# Patient Record
Sex: Male | Born: 1955 | Race: White | Hispanic: No | Marital: Married | Smoking: Never smoker
Health system: Southern US, Community
[De-identification: ages and names within clinical notes are randomized; demographics above are authoritative.]

## PROBLEM LIST (undated history)

## (undated) DIAGNOSIS — C649 Malignant neoplasm of unspecified kidney, except renal pelvis: Secondary | ICD-10-CM

## (undated) DIAGNOSIS — N289 Disorder of kidney and ureter, unspecified: Secondary | ICD-10-CM

## (undated) DIAGNOSIS — F329 Major depressive disorder, single episode, unspecified: Secondary | ICD-10-CM

## (undated) DIAGNOSIS — I219 Acute myocardial infarction, unspecified: Secondary | ICD-10-CM

## (undated) DIAGNOSIS — E785 Hyperlipidemia, unspecified: Secondary | ICD-10-CM

## (undated) DIAGNOSIS — I10 Essential (primary) hypertension: Secondary | ICD-10-CM

## (undated) DIAGNOSIS — C4490 Unspecified malignant neoplasm of skin, unspecified: Secondary | ICD-10-CM

## (undated) DIAGNOSIS — I509 Heart failure, unspecified: Secondary | ICD-10-CM

## (undated) DIAGNOSIS — F32A Depression, unspecified: Secondary | ICD-10-CM

## (undated) DIAGNOSIS — M1990 Unspecified osteoarthritis, unspecified site: Secondary | ICD-10-CM

## (undated) DIAGNOSIS — F419 Anxiety disorder, unspecified: Secondary | ICD-10-CM

## (undated) DIAGNOSIS — K219 Gastro-esophageal reflux disease without esophagitis: Secondary | ICD-10-CM

## (undated) HISTORY — DX: Unspecified osteoarthritis, unspecified site: M19.90

## (undated) HISTORY — DX: Depression, unspecified: F32.A

## (undated) HISTORY — PX: CARDIAC CATHETERIZATION: SHX172

## (undated) HISTORY — DX: Malignant neoplasm of unspecified kidney, except renal pelvis: C64.9

## (undated) HISTORY — PX: BACK SURGERY: SHX140

## (undated) HISTORY — PX: TONSILLECTOMY: SUR1361

## (undated) HISTORY — DX: Anxiety disorder, unspecified: F41.9

## (undated) HISTORY — DX: Acute myocardial infarction, unspecified: I21.9

## (undated) HISTORY — DX: Major depressive disorder, single episode, unspecified: F32.9

## (undated) HISTORY — DX: Unspecified malignant neoplasm of skin, unspecified: C44.90

## (undated) HISTORY — DX: Gastro-esophageal reflux disease without esophagitis: K21.9

## (undated) HISTORY — DX: Hyperlipidemia, unspecified: E78.5

## (undated) HISTORY — PX: KNEE SURGERY: SHX244

## (undated) HISTORY — DX: Essential (primary) hypertension: I10

## (undated) HISTORY — PX: LEG SURGERY: SHX1003

---

## 2007-03-14 ENCOUNTER — Emergency Department: Payer: Self-pay | Admitting: Emergency Medicine

## 2014-01-21 DIAGNOSIS — C7951 Secondary malignant neoplasm of bone: Secondary | ICD-10-CM | POA: Insufficient documentation

## 2014-01-21 DIAGNOSIS — C649 Malignant neoplasm of unspecified kidney, except renal pelvis: Secondary | ICD-10-CM | POA: Insufficient documentation

## 2014-01-21 DIAGNOSIS — C7800 Secondary malignant neoplasm of unspecified lung: Secondary | ICD-10-CM | POA: Insufficient documentation

## 2015-06-07 DIAGNOSIS — I82432 Acute embolism and thrombosis of left popliteal vein: Secondary | ICD-10-CM | POA: Insufficient documentation

## 2015-12-09 ENCOUNTER — Inpatient Hospital Stay: Payer: 59 | Attending: Hematology and Oncology | Admitting: Hematology and Oncology

## 2015-12-09 ENCOUNTER — Encounter: Payer: Self-pay | Admitting: Hematology and Oncology

## 2015-12-09 ENCOUNTER — Inpatient Hospital Stay: Payer: 59

## 2015-12-09 ENCOUNTER — Other Ambulatory Visit: Payer: Self-pay | Admitting: Hematology and Oncology

## 2015-12-09 ENCOUNTER — Encounter (INDEPENDENT_AMBULATORY_CARE_PROVIDER_SITE_OTHER): Payer: Self-pay

## 2015-12-09 VITALS — BP 142/93 | HR 66 | Temp 98.0°F | Resp 17 | Ht 75.0 in | Wt 252.3 lb

## 2015-12-09 DIAGNOSIS — I10 Essential (primary) hypertension: Secondary | ICD-10-CM | POA: Diagnosis not present

## 2015-12-09 DIAGNOSIS — Z8701 Personal history of pneumonia (recurrent): Secondary | ICD-10-CM | POA: Insufficient documentation

## 2015-12-09 DIAGNOSIS — R339 Retention of urine, unspecified: Secondary | ICD-10-CM | POA: Diagnosis not present

## 2015-12-09 DIAGNOSIS — R918 Other nonspecific abnormal finding of lung field: Secondary | ICD-10-CM | POA: Diagnosis not present

## 2015-12-09 DIAGNOSIS — I82432 Acute embolism and thrombosis of left popliteal vein: Secondary | ICD-10-CM

## 2015-12-09 DIAGNOSIS — Z86718 Personal history of other venous thrombosis and embolism: Secondary | ICD-10-CM | POA: Diagnosis not present

## 2015-12-09 DIAGNOSIS — F329 Major depressive disorder, single episode, unspecified: Secondary | ICD-10-CM | POA: Diagnosis not present

## 2015-12-09 DIAGNOSIS — C7800 Secondary malignant neoplasm of unspecified lung: Secondary | ICD-10-CM

## 2015-12-09 DIAGNOSIS — R591 Generalized enlarged lymph nodes: Secondary | ICD-10-CM | POA: Diagnosis not present

## 2015-12-09 DIAGNOSIS — Z7901 Long term (current) use of anticoagulants: Secondary | ICD-10-CM | POA: Insufficient documentation

## 2015-12-09 DIAGNOSIS — Z923 Personal history of irradiation: Secondary | ICD-10-CM

## 2015-12-09 DIAGNOSIS — C642 Malignant neoplasm of left kidney, except renal pelvis: Secondary | ICD-10-CM

## 2015-12-09 DIAGNOSIS — Z79899 Other long term (current) drug therapy: Secondary | ICD-10-CM

## 2015-12-09 DIAGNOSIS — I252 Old myocardial infarction: Secondary | ICD-10-CM | POA: Insufficient documentation

## 2015-12-09 DIAGNOSIS — C7951 Secondary malignant neoplasm of bone: Secondary | ICD-10-CM | POA: Diagnosis not present

## 2015-12-09 LAB — COMPREHENSIVE METABOLIC PANEL
ALT: 18 U/L (ref 17–63)
AST: 18 U/L (ref 15–41)
Albumin: 3.7 g/dL (ref 3.5–5.0)
Alkaline Phosphatase: 78 U/L (ref 38–126)
Anion gap: 7 (ref 5–15)
BUN: 19 mg/dL (ref 6–20)
CO2: 29 mmol/L (ref 22–32)
Calcium: 8.8 mg/dL — ABNORMAL LOW (ref 8.9–10.3)
Chloride: 102 mmol/L (ref 101–111)
Creatinine, Ser: 1.04 mg/dL (ref 0.61–1.24)
GFR calc Af Amer: >60 mL/min (ref >60)
GFR calc non Af Amer: >60 mL/min (ref >60)
Glucose, Bld: 105 mg/dL — ABNORMAL HIGH (ref 65–99)
Potassium: 4.7 mmol/L (ref 3.5–5.1)
Sodium: 138 mmol/L (ref 135–145)
Total Bilirubin: 1 mg/dL (ref 0.3–1.2)
Total Protein: 6.9 g/dL (ref 6.5–8.1)

## 2015-12-09 LAB — CBC WITH DIFFERENTIAL/PLATELET
Basophils Absolute: 0 10*3/uL (ref 0–0.1)
Basophils Relative: 0 %
Eosinophils Absolute: 0.1 10*3/uL (ref 0–0.7)
Eosinophils Relative: 3 %
HCT: 36.2 % — ABNORMAL LOW (ref 40.0–52.0)
Hemoglobin: 12 g/dL — ABNORMAL LOW (ref 13.0–18.0)
Lymphocytes Relative: 12 %
Lymphs Abs: 0.6 10*3/uL — ABNORMAL LOW (ref 1.0–3.6)
MCH: 30 pg (ref 26.0–34.0)
MCHC: 33 g/dL (ref 32.0–36.0)
MCV: 90.9 fL (ref 80.0–100.0)
Monocytes Absolute: 0.5 10*3/uL (ref 0.2–1.0)
Monocytes Relative: 9 %
Neutro Abs: 3.9 10*3/uL (ref 1.4–6.5)
Neutrophils Relative %: 76 %
Platelets: 147 10*3/uL — ABNORMAL LOW (ref 150–440)
RBC: 3.99 MIL/uL — ABNORMAL LOW (ref 4.40–5.90)
RDW: 17.3 % — ABNORMAL HIGH (ref 11.5–14.5)
WBC: 5.1 10*3/uL (ref 3.8–10.6)

## 2015-12-09 LAB — LACTATE DEHYDROGENASE: LDH: 141 U/L (ref 98–192)

## 2015-12-09 NOTE — Progress Notes (Signed)
Pt reports pain in left leg and back.  Pt reports abdominal hernia.  Left kidney non-functioning. Uses walker for hx of broken left leg due to mets.  Pt reports he thinks he is due for CT and bone scan.  Had a urologist would like to be set up here and orthopedic Dr. for hx of fracture related to the cancer.

## 2015-12-10 ENCOUNTER — Telehealth: Payer: Self-pay | Admitting: *Deleted

## 2015-12-10 NOTE — Telephone Encounter (Signed)
I called pt and he is just moved here from [facility] and he needs PCP, orthopedic, dentist and urology appt.  I entered the urology appt through the [facility] system and they called pt back today and gave him an appt to see a physician 7/11 at [facility] urology. I got an appt with PCP at [facility] clinic with Dr. Carlean Jews on 12/13/15 9 am.  A pt had cancelled and they had that spot available otherwise it would be in august before he could see a PCP.  Pt acceptable to the appt with Dr. Carlean Jews and all notes from [facility] sent on pt.  [name] note to be done this weekend and I will fax am on Monday.  I have tried getting ortho appt. But they would allow me to send notes over but they are requesting that I get the surgery notes and pathology notes before they will make an appt.  Dr. Marry Guan on call today and I was told to send what I had to [phone].  I did. Will request the additional records from [place].  Still working on dentist appt but pt made aware of the status.

## 2015-12-12 ENCOUNTER — Encounter: Payer: Self-pay | Admitting: Hematology and Oncology

## 2015-12-12 NOTE — Progress Notes (Signed)
[facility] day:  12/09/2015  Chief Complaint: Marc Blake is a 60 y.o. male with metastatic renal cell carcinoma who is referred by Dr. Karie Soda of [facility] for new patient assessment.  HPI: The patient presented with metastatic renal cell carcinoma in 2015. He described feeling lousy and run down for a long period of time.  He noted pain in his left upper abdomen, back and around his ribs, and hematuria.  He underwent imaging studies in [place], [place].    Abdominal and pelvic CT scan on 01/12/2014 revealed an 8.5 x 10.2 x 9.2 cm irregularly enhancing mass of the left kidney with some internal calcifications. There were enlarged left sided renal hilar lymph nodes. There were multiple pulmonary nodules in both lower lobes(largest 1.1 cm). There was a destructive bone lesion with an irregular 8.6 x 5.1 cm soft tissue mass invasive of the spinal canal narrowing at least 50% and disturbing the pedicle and portions of the left transverse process rib and T8 vertebral body.   Chest CT on 01/15/2014 revealed innumerable pulmonary nodules, a large metastatic lesion at T8 and left eighth rib with invasion into the spinal canal and moderate canal stenosis. There was a left upper pole right kidney mass.  Head MRI was negative.  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  He began pazopanib (Votrient) on 02/13/2014. Treatment began at 800 mg a day.  At some point, his dose was decreased to 600 mg a day (patient unclear of reason).  Chest CT on 05/04/2014 revealed lobular mass expanding expanding and destroying the posterior left eighth rib and invading the vertebral body with significant invasion into the spinal canal with severe narrowing. Lesion measured 8.7 x 4.3 cm.  Largest pulmonary nodules in the right lower lobe and left lower lobe measured 1.2 x 1.2 cm. The upper pole of the left kidney  tumor measured 7.6 x 10 cm Compared to films from 12/19/2013, the pulmonary nodules were slightly smaller and the kidney lesion stable.  Notes indicate he received radiation therapy to T7 - T9 and associated ribs.  He received 3000 cGy in 10 fractions by Dr. Robley Fries.  Radiation started on 05/11/2014.  Notes by Dr. Rudean Curt, oncologist, on 06/15/2014 indicated progressive disease on pazopanib.  He received 1 dose of nivolumab.  He was admitted to [facility] in [place], [place], from 07/06/2014 - 07/11/2014.  He presented with progressive paralysis from the waist down.  He underwent left posterolateral thoracotomy with left chest wall resection with T8 corpectomy and interbody fusion with donor bone on 07/06/2014 by Dr. Judeen Hammans and Dr. Bedelia Person.  Pathology revealed metastatic renal cell carcinoma.     Post-operatively, he developed a right sided pneumothorax requiring chest tube placement and intubation.   Bronchoscopy revealed thick mucous secretions.  He developed sepsis secondary to a submandibular abscess dental abscess (Ludwig's angina) with gas formation. He was treated with clindamycin and Unasyn.  On 07/09/2014 he underwent tracheostomy and I&D of a right neck abscess with removal of a right tooth #32.  He underwent a PEG tube placement.  Notes indicate that he was transferred to a higher care facility [facility]) secondary to necrotizing fasciitis  Chest, abdomen, and pelvic CT scan on 09/30/2014 revealed numerous bilateral pulmonary metastasis some of which were smaller in size and others slightly larger (all around 1 cm).  The left renal mass had increased to 8.7  x 10.5 cm (previously 8.4 x 10.1 cm).  Chest evident pelvic CT scan on 12/23/2014 revealed multiple pulmonary nodules some which had decreased in size and some stable. There were no new or enlarging lesions.  The left renal mass was slightly larger (11.3 x 8.9 cm) with hydronephrosis on the left which had  worsened slightly.   The right renal mass was stable.  He developed a pathologic fracture of the left femur on 01/26/2015 after crossing his legs.  He underwent intramedullary nailing of a left femur pathologic fracture on 01/27/2015 by Dr. Otelia Sergeant.  Following surgery, he was in inpatient rehab for slightly over 1 month.  He received radiation to the left leg.  He was admitted to [facility] from 04/26/2015 - 04/30/2015 with aspiration pneumonia.  He presented with acute hypoxemic respiratory failure.  Notes indicate opioid overdose. Long-acting morphine was reduced from 60 mg twice a day to 15 mg twice a day and short acting morphine discontinued.  Percocet was switched to oxycodone.  Benzodiazepines were decreased from 5 mg every 8 hours to 2.5 mg every 12 hours.  He is currently taking MSContin 15 mg BID and oxycodone 5 mg q day.  Chest, abdomen, and pelvic CT scan on 05/31/2015 revealed slight enlargement of multiple pulmonary nodules.  The left renal mass was similar (12.2 x 9.6 cm).  The right kidney lesion was stable (lower pole lesion 2 x 1.7 cm).  Bone scan on 05/31/2015 revealed slight increased activity in the left costal vertebral junction at T7.  He developed a left lower extremity DVT (popliteal and soleal vein) and was started on Xarelto on 06/07/2015.  Notes indicate that he was out of his Votrient for about 2 weeks in 09/2015.  The patient notes that he is due for imaging studies.  He was initially on Zometa, but has been on hold secondary to dental issues.  He was followed by Lorayne Bender of urology.  He has a long standing history of incomplete bladder emptying.  He has performed in and out self catheterization in the past.  He underwent TURP in 2005 and 2010.  CT scan on 12/23/2013 revealed severe left mid and lower pole hydronephrosis.  He underwent cystoscopy and urethral dilatation on 02/05/2014.  Creatinine was 2.09 on 02/13/2014.  On 02/13/2014 he was unable to void x 3  days.  A  Foley catheter was placed.  He was noted to have persistent elevated PVR (> 900 cc).  On 06/03/2014, he underwent cystourethroscopy and urethral dilatation.  Symptomatically, he feels good.  He notes decreased sensation in his left leg.  He is ambulating better.   Past Medical History  Diagnosis Date  . Hypertension   . Cancer [facility])     Renal cell carcinoma  . Depression   . Myocardial infarction ([place])   . Anxiety     Past Surgical History  Procedure Laterality Date  . Back surgery    . Leg surgery Left   . Knee surgery Left     Family History  Problem Relation Age of Onset  . Hypertension Father   . Heart attack Father   . Stroke Sister   . Hypertension Brother   . Stroke Maternal Uncle     Social History:  reports that he has never smoked. He has never used smokeless tobacco. He reports that he does not drink alcohol or use illicit drugs.  He is originally from [place].  He then lived in [place] for 17 years.  He  moved to [place] 2 months ago with his wife.  The patient is alone today.  Allergies: No Known Allergies  Current Medications: Current Outpatient Prescriptions  Medication Sig Dispense Refill  . amLODipine (NORVASC) 2.5 MG tablet TAKE 1 TABLET ONCE A DAY FOR 30 DAYS  2  . atorvastatin (LIPITOR) 20 MG tablet TAKE 1 TABLET ONCE A DAY (AT BEDTIME) FOR 30 DAYS  2  . busPIRone (BUSPAR) 5 MG tablet TAKE 1 TABLET 3 TIMES A DAY FOR 30 DAYS  2  . carvedilol (COREG) 12.5 MG tablet TAKE 1 TABLET BY MOUTH TWICE A DAY FOR 30 DAYS  3  . gabapentin (NEURONTIN) 300 MG capsule Take 300 mg by mouth 3 (three) times daily.  2  . lisinopril (PRINIVIL,ZESTRIL) 20 MG tablet TAKE 1 TABLET ONCE A DAY FOR 30 DAYS  3  . morphine (MS CONTIN) 15 MG 12 hr tablet Take 15 mg by mouth every 12 (twelve) hours.  0  . nitrofurantoin (MACRODANTIN) 50 MG capsule TAKE 1 CAPSULE BY MOUTH AT BEDTIME FOR 30 DAYS  3  . oxyCODONE (OXY IR/ROXICODONE) 5 MG immediate release tablet Take 5  mg by mouth every 8 (eight) hours as needed. for pain  0  . pazopanib (VOTRIENT) 200 MG tablet Take 600 mg by mouth daily. Take on an empty stomach.    . sertraline (ZOLOFT) 100 MG tablet Take 100 mg by mouth at bedtime.  1  . tamsulosin (FLOMAX) 0.4 MG CAPS capsule TAKE 2 CAPSULES ONCE A DAY FOR 30 DAY(S)  3  . traZODone (DESYREL) 50 MG tablet Take 25 mg by mouth daily.  3  . XARELTO 20 MG TABS tablet Take 20 mg by mouth daily.  1   No current facility-administered medications for this visit.    Review of Systems:  GENERAL:  Feels good.  No fevers or sweats.  Weight up 2 pounds from baseline. PERFORMANCE STATUS (ECOG):  1 HEENT:  No visual changes, runny nose, sore throat, mouth sores or tenderness. Lungs: No shortness of breath or cough.  No hemoptysis. Cardiac:  No chest pain, palpitations, orthopnea, or PND. GI:  Diarrhea 1 day/week; Probiotics help.  No nausea, vomiting, constipation, melena or hematochezia. GU:  Bladder emptying issues.  No self catheterization in awhile.  No urgency, frequency, dysuria, or hematuria. Musculoskeletal:  No back pain.  No joint pain.  No muscle tenderness. Extremities:  No pain or swelling. Skin:  No rashes or skin changes. Neuro:  No headache, numbness or weakness.  Decreased feeling left leg.  Slight balance problem. Endocrine:  No diabetes, thyroid issues, hot flashes or night sweats. Psych:  No mood changes, depression or anxiety. Pain:  No focal pain. Review of systems:  All other systems reviewed and found to be negative.  Physical Exam: Blood pressure 142/93, pulse 66, temperature 98 F (36.7 C), temperature source Tympanic, resp. rate 17, height _0  (1.905 m), weight 252 lb 5.1 oz (114.45 kg). GENERAL:  Well developed, well nourished, gentleman sitting comfortably in the exam room in no acute distress.  He has a rolling walker at his side. MENTAL STATUS:  Alert and oriented to person, place and time. HEAD:  Long gray hair with goatee.   Normocephalic, atraumatic, face symmetric, no Cushingoid features. EYES:  Pupils equal round and reactive to light and accomodation.  No conjunctivitis or scleral icterus. ENT:  Oropharynx clear without lesion.  Poor dentition.  Tongue normal. Mucous membranes moist.  RESPIRATORY:  Clear to auscultation without rales,  wheezes or rhonchi. CHEST WALL:  Well healed incision on left posterior chest wall.  CARDIOVASCULAR:  Regular rate and rhythm without murmur, rub or gallop. ABDOMEN:  Soft, non-tender, with active bowel sounds, and no hepatosplenomegaly.  No masses. SKIN:  Tattoos.  No rashes, ulcers or lesions. EXTREMITIES: Chronic lower extremity edema (left > right).  Noskin discoloration or tenderness.  No palpable cords. LYMPH NODES: No palpable cervical, supraclavicular, axillary or inguinal adenopathy  NEUROLOGICAL: Alert & oriented, cranial nerves II-XII intact; motor strength 4/5 left proximal lower extremity; decreased sensation left leg; finger to nose and RAM normal; left patellar reflex trace, right patellar reflex 2+; negative Rhomberg; no clonus or Babinski. PSYCH:  Appropriate.  Marland Kitchen  Appointment on 12/09/2015  Component Date Value Ref Range Status  . WBC 12/09/2015 5.1  3.8 - 10.6 K/uL Final  . RBC 12/09/2015 3.99* 4.40 - 5.90 MIL/uL Final  . Hemoglobin 12/09/2015 12.0* 13.0 - 18.0 g/dL Final  . HCT 12/09/2015 36.2* 40.0 - 52.0 % Final  . MCV 12/09/2015 90.9  80.0 - 100.0 fL Final  . MCH 12/09/2015 30.0  26.0 - 34.0 pg Final  . MCHC 12/09/2015 33.0  32.0 - 36.0 g/dL Final  . RDW 12/09/2015 17.3* 11.5 - 14.5 % Final  . Platelets 12/09/2015 147* 150 - 440 K/uL Final  . Neutrophils Relative % 12/09/2015 76   Final  . Neutro Abs 12/09/2015 3.9  1.4 - 6.5 K/uL Final  . Lymphocytes Relative 12/09/2015 12   Final  . Lymphs Abs 12/09/2015 0.6* 1.0 - 3.6 K/uL Final  . Monocytes Relative 12/09/2015 9   Final  . Monocytes Absolute 12/09/2015 0.5  0.2 - 1.0 K/uL Final  . Eosinophils  Relative 12/09/2015 3   Final  . Eosinophils Absolute 12/09/2015 0.1  0 - 0.7 K/uL Final  . Basophils Relative 12/09/2015 0   Final  . Basophils Absolute 12/09/2015 0.0  0 - 0.1 K/uL Final  . Sodium 12/09/2015 138  135 - 145 mmol/L Final  . Potassium 12/09/2015 4.7  3.5 - 5.1 mmol/L Final  . Chloride 12/09/2015 102  101 - 111 mmol/L Final  . CO2 12/09/2015 29  22 - 32 mmol/L Final  . Glucose, Bld 12/09/2015 105* 65 - 99 mg/dL Final  . BUN 12/09/2015 19  6 - 20 mg/dL Final  . Creatinine, Ser 12/09/2015 1.04  0.61 - 1.24 mg/dL Final  . Calcium 12/09/2015 8.8* 8.9 - 10.3 mg/dL Final  . Total Protein 12/09/2015 6.9  6.5 - 8.1 g/dL Final  . Albumin 12/09/2015 3.7  3.5 - 5.0 g/dL Final  . AST 12/09/2015 18  15 - 41 U/L Final  . ALT 12/09/2015 18  17 - 63 U/L Final  . Alkaline Phosphatase 12/09/2015 78  38 - 126 U/L Final  . Total Bilirubin 12/09/2015 1.0  0.3 - 1.2 mg/dL Final  . GFR calc non Af Amer 12/09/2015 >60  >60 mL/min Final  . GFR calc Af Amer 12/09/2015 >60  >60 mL/min Final   Comment: (NOTE) The eGFR has been calculated using the CKD EPI equation. This calculation has not been validated in all clinical situations. eGFR's persistently <60 mL/min signify possible Chronic Kidney Disease.   . Anion gap 12/09/2015 7  5 - 15 Final  . LDH 12/09/2015 141  98 - 192 U/L Final    Assessment:  Marc Blake is a 60 y.o. male with metastatic renal cell carcinoma presenting in 12/2013.  He had a large left renal mass, rib  and vertebral body involvement, and multiple pulmonary nodules.  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  He has been on Votrient (pazopanib) since 02/13/2014.  Abdominal and pelvic CT scan on 01/12/2014 revealed an 8.5 x 10.2 x 9.2 cm irregularly enhancing mass of the left kidney with some internal calcifications. There were enlarged left sided renal hilar lymph nodes. There were multiple pulmonary nodules in both lower  lobes(largest 1.1 cm). There was a destructive bone lesion with an irregular 8.6 x 5.1 cm soft tissue mass invasive of the spinal canal narrowing at least 50% and disturbing the pedicle and portions of the left transverse process rib and T8 vertebral body.   Chest CT on 01/15/2014 revealed innumerable pulmonary nodules, a large metastatic lesion at T8 and left eighth rib with invasion into the spinal canal and moderate canal stenosis. There was a left upper pole right kidney mass.  Head MRI was negative.  He received 3000 cGy to T7 - T9 and associated ribs beginning 05/11/2014.  On 06/15/2014, he received 1 dose of nivolumab.  He presented with progressive paralysis from the waist down on 07/06/2014.  He underwent left posterolateral thoracotomy with left chest wall resection with T8 corpectomy and interbody fusion with donor bone on 07/06/2014.  Pathology revealed metastatic renal cell carcinoma. Post-operatively, he developed sepsis secondary to a submandibular abscess dental abscess.  He underwent tracheostomy, I&D of a right neck abscess, tooth extraction, and PEG tube placement.  Zometa has subsequently been on hold.  He developed a pathologic fracture of the left femur.  He is s/p intramedullary nailing on 01/27/2015.  He received radiation to the left leg.  He was admitted on 04/26/2015 with aspiration pneumonia and opioid overdose. He is currently taking MSContin 15 mg BID and oxycodone 5 mg q day.  Chest, abdomen, and pelvic CT scan on 05/31/2015 revealed slight enlargement of multiple pulmonary nodules.  The left renal mass was similar (12.2 x 9.6 cm).  The right kidney lesion was stable (lower pole lesion 2 x 1.7 cm).  Bone scan on 05/31/2015 revealed slight increased activity in the left costal vertebral junction at T7.  He developed a left lower extremity DVT (popliteal and soleal vein) on 06/07/2015.  He is on Xarelto.  Patient has a long standing history of an obstructive uropathy and  elevated PVR.  He has undergone urethral dilatation on several occasions.  Symptomatically, he feels good.  He has mild left lower extremity weakness.  Plan: 1.  Review entire medical history, diagnosis of metastatic renal cell carcinoma and treatment tot date.  Discuss need to review all medical records and obtain copies of imaging studies.  Discuss concern for progressive disease on pazopanib (Votrient).  Discuss other treatment options.  Discuss plan for phone follow-up with Dr. Rudean Curt [phone]) re: nivolumab. 2.  Labs today:  CBC with diff, CMP, LDH. 3.  Assist with patient finding a primary care physician. 4.  Consult urology (Dr. Erlene Quan). 5.  Consult orthopedics. 6.  Consult dentistry.  Would like to address dental issues so that patient may receive Zometa in future. 7.  Schedule chest, abdomen, and pelvic CT scan: restaging; obtain old films for comparison. 8.  Schedule bone scan: restaging. 9.  Complete paperwork for Votrient continuation until images obtained and disease status documented. 10.  RTC after imaging studies.   [name], MD  12/09/2015, 3:17 PM

## 2015-12-13 ENCOUNTER — Other Ambulatory Visit: Payer: Self-pay | Admitting: Family Medicine

## 2015-12-13 DIAGNOSIS — Z86718 Personal history of other venous thrombosis and embolism: Secondary | ICD-10-CM

## 2015-12-13 DIAGNOSIS — I10 Essential (primary) hypertension: Secondary | ICD-10-CM | POA: Insufficient documentation

## 2015-12-13 DIAGNOSIS — F324 Major depressive disorder, single episode, in partial remission: Secondary | ICD-10-CM | POA: Insufficient documentation

## 2015-12-13 DIAGNOSIS — G893 Neoplasm related pain (acute) (chronic): Secondary | ICD-10-CM | POA: Insufficient documentation

## 2015-12-14 ENCOUNTER — Ambulatory Visit
Admission: RE | Admit: 2015-12-14 | Discharge: 2015-12-14 | Disposition: A | Discharge status: Home / Self Care | Payer: 59 | Source: Ambulatory Visit | Attending: Hematology and Oncology | Admitting: Hematology and Oncology

## 2015-12-14 ENCOUNTER — Other Ambulatory Visit: Payer: Self-pay | Admitting: *Deleted

## 2015-12-14 DIAGNOSIS — C7951 Secondary malignant neoplasm of bone: Secondary | ICD-10-CM | POA: Insufficient documentation

## 2015-12-14 DIAGNOSIS — I2510 Atherosclerotic heart disease of native coronary artery without angina pectoris: Secondary | ICD-10-CM | POA: Diagnosis not present

## 2015-12-14 DIAGNOSIS — R590 Localized enlarged lymph nodes: Secondary | ICD-10-CM | POA: Insufficient documentation

## 2015-12-14 DIAGNOSIS — C642 Malignant neoplasm of left kidney, except renal pelvis: Secondary | ICD-10-CM | POA: Diagnosis not present

## 2015-12-14 DIAGNOSIS — C7800 Secondary malignant neoplasm of unspecified lung: Secondary | ICD-10-CM | POA: Insufficient documentation

## 2015-12-14 DIAGNOSIS — N1330 Unspecified hydronephrosis: Secondary | ICD-10-CM | POA: Diagnosis not present

## 2015-12-14 DIAGNOSIS — I82432 Acute embolism and thrombosis of left popliteal vein: Secondary | ICD-10-CM | POA: Insufficient documentation

## 2015-12-14 DIAGNOSIS — I700 Atherosclerosis of aorta: Secondary | ICD-10-CM | POA: Insufficient documentation

## 2015-12-14 MED ORDER — IOPAMIDOL (ISOVUE-370) INJECTION 76%
100.0000 mL | Freq: Once | INTRAVENOUS | Status: AC | PRN
  Administered 2015-12-14: 100 mL via INTRAVENOUS

## 2015-12-15 ENCOUNTER — Ambulatory Visit: Admission: RE | Admit: 2015-12-15 | Payer: 59 | Source: Ambulatory Visit

## 2015-12-16 ENCOUNTER — Ambulatory Visit
Admission: RE | Admit: 2015-12-16 | Discharge: 2015-12-16 | Disposition: A | Discharge status: Home / Self Care | Payer: 59 | Source: Ambulatory Visit | Attending: Family Medicine | Admitting: Family Medicine

## 2015-12-16 ENCOUNTER — Ambulatory Visit: Payer: 59

## 2015-12-16 DIAGNOSIS — Z86718 Personal history of other venous thrombosis and embolism: Secondary | ICD-10-CM | POA: Diagnosis present

## 2015-12-20 ENCOUNTER — Ambulatory Visit: Payer: 59 | Admitting: Hematology and Oncology

## 2015-12-23 ENCOUNTER — Encounter
Admission: RE | Admit: 2015-12-23 | Discharge: 2015-12-23 | Disposition: A | Discharge status: Home / Self Care | Payer: 59 | Source: Ambulatory Visit | Attending: Hematology and Oncology | Admitting: Hematology and Oncology

## 2015-12-23 ENCOUNTER — Ambulatory Visit: Payer: 59 | Admitting: Urology

## 2015-12-23 DIAGNOSIS — N320 Bladder-neck obstruction: Secondary | ICD-10-CM | POA: Insufficient documentation

## 2015-12-23 DIAGNOSIS — C7800 Secondary malignant neoplasm of unspecified lung: Secondary | ICD-10-CM | POA: Insufficient documentation

## 2015-12-23 DIAGNOSIS — C642 Malignant neoplasm of left kidney, except renal pelvis: Secondary | ICD-10-CM | POA: Diagnosis present

## 2015-12-23 DIAGNOSIS — Z923 Personal history of irradiation: Secondary | ICD-10-CM | POA: Diagnosis not present

## 2015-12-23 DIAGNOSIS — C7951 Secondary malignant neoplasm of bone: Secondary | ICD-10-CM

## 2015-12-23 DIAGNOSIS — N2889 Other specified disorders of kidney and ureter: Secondary | ICD-10-CM | POA: Insufficient documentation

## 2015-12-23 DIAGNOSIS — I82432 Acute embolism and thrombosis of left popliteal vein: Secondary | ICD-10-CM

## 2015-12-23 DIAGNOSIS — M1990 Unspecified osteoarthritis, unspecified site: Secondary | ICD-10-CM | POA: Diagnosis not present

## 2015-12-23 MED ORDER — TECHNETIUM TC 99M MEDRONATE IV KIT
25.0000 | PACK | Freq: Once | INTRAVENOUS | Status: AC | PRN
  Administered 2015-12-23: 24.13 via INTRAVENOUS

## 2015-12-24 ENCOUNTER — Inpatient Hospital Stay: Payer: 59 | Attending: Hematology and Oncology | Admitting: Hematology and Oncology

## 2015-12-24 VITALS — BP 137/88 | HR 65 | Temp 97.8°F | Ht 75.0 in | Wt 248.1 lb

## 2015-12-24 DIAGNOSIS — C642 Malignant neoplasm of left kidney, except renal pelvis: Secondary | ICD-10-CM | POA: Diagnosis not present

## 2015-12-24 DIAGNOSIS — C7800 Secondary malignant neoplasm of unspecified lung: Secondary | ICD-10-CM

## 2015-12-24 DIAGNOSIS — F329 Major depressive disorder, single episode, unspecified: Secondary | ICD-10-CM | POA: Insufficient documentation

## 2015-12-24 DIAGNOSIS — Z7901 Long term (current) use of anticoagulants: Secondary | ICD-10-CM | POA: Diagnosis not present

## 2015-12-24 DIAGNOSIS — R918 Other nonspecific abnormal finding of lung field: Secondary | ICD-10-CM

## 2015-12-24 DIAGNOSIS — N1330 Unspecified hydronephrosis: Secondary | ICD-10-CM | POA: Diagnosis not present

## 2015-12-24 DIAGNOSIS — Z8701 Personal history of pneumonia (recurrent): Secondary | ICD-10-CM | POA: Diagnosis not present

## 2015-12-24 DIAGNOSIS — I252 Old myocardial infarction: Secondary | ICD-10-CM | POA: Insufficient documentation

## 2015-12-24 DIAGNOSIS — I10 Essential (primary) hypertension: Secondary | ICD-10-CM | POA: Diagnosis not present

## 2015-12-24 DIAGNOSIS — Z79899 Other long term (current) drug therapy: Secondary | ICD-10-CM | POA: Insufficient documentation

## 2015-12-24 DIAGNOSIS — Z8781 Personal history of (healed) traumatic fracture: Secondary | ICD-10-CM | POA: Diagnosis not present

## 2015-12-24 DIAGNOSIS — N3289 Other specified disorders of bladder: Secondary | ICD-10-CM | POA: Diagnosis not present

## 2015-12-24 DIAGNOSIS — F419 Anxiety disorder, unspecified: Secondary | ICD-10-CM | POA: Diagnosis not present

## 2015-12-24 DIAGNOSIS — Z86718 Personal history of other venous thrombosis and embolism: Secondary | ICD-10-CM | POA: Diagnosis not present

## 2015-12-24 DIAGNOSIS — N400 Enlarged prostate without lower urinary tract symptoms: Secondary | ICD-10-CM

## 2015-12-24 DIAGNOSIS — I82432 Acute embolism and thrombosis of left popliteal vein: Secondary | ICD-10-CM

## 2015-12-24 DIAGNOSIS — C7951 Secondary malignant neoplasm of bone: Secondary | ICD-10-CM | POA: Insufficient documentation

## 2015-12-24 NOTE — Patient Instructions (Signed)
Axitinib oral tablets What is this medicine? AXITINIB (ax I ti nib) is a medicine that targets proteins in cancer cells and stops the cancer cells from growing. It is used to treat kidney cancers. This medicine may be used for other purposes; ask your health care provider or pharmacist if you have questions. What should I tell my health care provider before I take this medicine? They need to know if you have any of these conditions: -bleeding disorders -brain tumor -heart disease -high blood pressureEverolimus tablets What is this medicine? EVEROLIMUS (eve ROE li mus) decreases the activity of your immune system. Afinitor is used to treat certain types of cancer. Zortress is used for kidney and liver transplant rejection prophylaxis. This medicine may be used for other purposes; ask your health care provider or pharmacist if you have questions. What should I tell my health care provider before I take this medicine? They need to know if you have any of these conditions: -diabetes -heart disease -high cholesterol -immune system problems -infection (especially a virus infection such as chickenpox, cold sores, or herpes) -kidney disease -liver disease -low blood counts, like low white cell, platelet, or red cell counts -rare hereditary problems of galactose intolerance, the Lapp lactase deficiency, or glucose-galactose malabsorption -an unusual or allergic reaction to everolimus, other medicines, foods, dyes, or preservatives -pregnant or trying to get pregnant -breast-feeding How should I use this medicine? Take this medicine by mouth with a full glass of water. Follow the directions on the prescription label. You can take this medicine with or without food, but always take Zortress the same way. Do not cut, crush, or chew this medicine. Do not take with grapefruit juice. Take your medicine at regular intervals. Do not take it more often than directed. Do not stop taking except on your  doctor's advice. Talk to your pediatrician regarding the use of this medicine in children. Special care may be needed. Overdosage: If you think you have taken too much of this medicine contact a poison control center or emergency room at once. NOTE: This medicine is only for you. Do not share this medicine with others. What if I miss a dose? If you miss a dose, take it as soon as you can. If it is almost time for your next dose, take only that dose. Do not take double or extra doses. What may interact with this medicine? This medicine may interact with the following medications: -antiviral medicines for HIV or AIDS -aprepitant -carbamazepine -certain medicines for cholesterol like simvastatin -clarithromycin -cyclosporine -dexamethasone -diltiazem -erythromycin -fluconazole -grapefruit juice -itraconazole -ketoconazole -live vaccines -nefazodone -nicardipine -phenobarbital -phenytoin -rifabutin -rifampin -telithromycin -verapamil -voriconazole This list may not describe all possible interactions. Give your health care provider a list of all the medicines, herbs, non-prescription drugs, or dietary supplements you use. Also tell them if you smoke, drink alcohol, or use illegal drugs. Some items may interact with your medicine. What should I watch for while using this medicine? This drug may make you feel generally unwell. This is not uncommon, as chemotherapy can affect healthy cells as well as cancer cells. Report any side effects. Continue your course of treatment even though you feel ill unless your doctor tells you to stop. Visit your doctor or health care professional for regular check-ups. You may need regular tests to monitor possible side effects of the drug. This medicine may affect blood sugar levels. If you have diabetes, check with your doctor or health care professional before you change your diet or  the dose of your diabetic medicine. Do not become pregnant while taking  this medicine or for 8 weeks after stopping it. Women should inform their doctor if they wish to become pregnant or think they might be pregnant. There is a potential for serious side effects to an unborn child. Talk to your health care professional or pharmacist for more information. Do not breast-feed an infant while taking this medicine or for 2 weeks after stopping it. This medicine has caused ovarian failure in some women. This medicine may interfere with the ability to have a child. You should talk with your doctor or health care professional if you are concerned about your fertility. This medicine has caused reduced sperm counts in some men. This may interfere with the ability to father a child. You should talk to your doctor or health care professional if you are concerned about your fertility. Call your doctor or health care professional for advice if you get a fever, chills or sore throat, or other symptoms of a cold or flu. Do not treat yourself. This drug decreases your body's ability to fight infections. Try to avoid being around people who are sick. This medicine may increase your risk to bruise or bleed. Call your doctor or health care professional if you notice any unusual bleeding. If you have had a kidney transplant, immediately tell your doctor if your incision site is red, warm, or painful. Also, tell your doctor if your incision site opens up or swells or if contains blood, fluid, or pus. Keep out of the sun. If you cannot avoid being in the sun, wear protective clothing and use sunscreen. Do not use sun lamps or tanning beds/booths. What side effects may I notice from receiving this medicine? Side effects that you should report to your doctor or health care professional as soon as possible: -allergic reactions like skin rash, itching or hives, swelling of the face, lips, or tongue -breathing problems -chest pain -cough -dark urine -fever or chills, sore throat -increased hunger  or thirst -increased urination -nausea, vomiting -stomach pain -swelling of ankles, feet, hands -trouble passing urine or change in the amount of urine -unusual bleeding or bruising -unusually weak or tired Side effects that usually do not require medical attention (Report these to your doctor or health care professional if they continue or are bothersome.): -constipation -diarrhea -dizziness -dry mouth or mouth sores -headache -nausea, vomiting This list may not describe all possible side effects. Call your doctor for medical advice about side effects. You may report side effects to FDA at 1-800-FDA-1088. Where should I keep my medicine? Keep out of the reach of children. Store at room temperature between 15 and 30 degrees C (59 and 86 degrees F). Throw away any unused medicine after the expiration date. NOTE: This sheet is a summary. It may not cover all possible information. If you have questions about this medicine, talk to your doctor, pharmacist, or health care provider.    2016, [employer]. (2014-07-28 17:53:48) Lenvatinib oral capsule What is this medicine? LENVATINIB (len VA ti nib) is a medicine that targets proteins in cancer cells and stops the cancer cells from growing. It is used to treat thyroid cancer and kidney cancer. This medicine may be used for other purposes; ask your health care provider or pharmacist if you have questions. What should I tell my health care provider before I take this medicine? They need to know if you have any of these conditions: -diabetes -high blood pressure -heart disease -  history of blood clots -history of a drug or alcohol abuse problem -history of irregular heartbeat -history of low levels of calcium, magnesium, or potassium in the blood -inflammatory bowel disease -kidney disease -liver disease -protein in your urine -an unusual or allergic reaction to lenvatinib, other medicines, foods, dyes, or  preservatives -pregnant or trying to get pregnant -breast-feeding How should I use this medicine? Take this medicine by mouth with a glass of water. Follow the directions on the prescription label. Swallow the capsules whole. Do not cut, crush or chew this medicine. Take your medicine at regular intervals. Do not take it more often than directed. Do not stop taking except on your doctor's advice. Talk to your pediatrician regarding the use of this medicine in children. Special care may be needed. Overdosage: If you think you have taken too much of this medicine contact a poison control center or emergency room at once. NOTE: This medicine is only for you. Do not share this medicine with others. What if I miss a dose? If you miss a dose, take it as soon as you can. If your next dose is to be taken in less than 12 hours, then do not take the missed dose. Take the next dose at your regular time. Do not take double or extra doses. What may interact with this medicine? Do not take this medicine with any of the following medications: -cisapride -dofetilide -dronedarone -pimozide -thioridazine -ziprasidone This medicine may also interact with the following medications: -alfuzosin -bedaquiline -certain antibiotics like azithromycin, clarithromycin or erythromycin -certain medicines for bladder problems like solifenacin, tolterodine -certain medicines for depression, anxiety, or psychotic disturbances -certain medicines for fungal infections like ketoconazole, posaconazole, voriconazole, fluconazole, and itraconazole -certain medicines for irregular heart beat like amiodarone, dofetilide, flecainide, propafenone, quinidine -chloroquine -ciprofloxacin -cyclobenzaprine -ezogabine -fingolimod -granisetron -leuprolide -ritonavir -lopinavir; ritonavir -methadone -metronidazole -mifepristone -octreotide -ondansetron -other medicines that prolong the QT interval (cause an abnormal heart  rhythm) -pasireotide -pentamidine -promethazine -quinine -ranolazine -rifampin -rilpivirine -romidepsin -saquinavir -tacrolimus -telavancin -telithromycin -tetrabenazine -tizanidine -toremifene -vardenafil -vorinostat This list may not describe all possible interactions. Give your health care provider a list of all the medicines, herbs, non-prescription drugs, or dietary supplements you use. Also tell them if you smoke, drink alcohol, or use illegal drugs. Some items may interact with your medicine. What should I watch for while using this medicine? Visit your doctor for regular check ups. Report any side effects. Continue your course of treatment unless your doctor tells you to stop. You will need blood work done while you are taking this medicine. Do not become pregnant while taking this medicine or for 2 weeks after stopping it. Women should inform their doctor if they wish to become pregnant or think they might be pregnant. There is a potential for serious side effects to an unborn child. Talk to your health care professional or pharmacist for more information. Do not breast-feed an infant while taking this medicine. Be careful brushing and flossing your teeth or using a toothpick because you may get an infection or bleed more easily. If you have any dental work done, tell your dentist you are receiving this medicine. This medicine may increase your risk to bruise or bleed. Call your doctor or health care professional if you notice any unusual bleeding. This drug may make you feel generally unwell. This is not uncommon, as chemotherapy can affect healthy cells as well as cancer cells. Report any side effects. Continue your course of treatment even though you feel ill  unless your doctor tells you to stop. This medicine may interfere with the ability to have a child. You should talk with your doctor or health care professional if you are concerned about your fertility. What side effects  may I notice from receiving this medicine? Side effects that you should report to your doctor or health care professional as soon as possible: -allergic reactions like skin rash, itching or hives, swelling of the face, lips, or tongue -breathing problems -chest pain or palpitations -dizziness -feeling faint or lightheaded, falls -headache -high blood pressure -seizures -signs and symptoms of bleeding such as bloody or black, tarry stools; red or dark-brown urine; spitting up blood or brown material that looks like coffee grounds; red spots on the skin; unusual bruising or bleeding from the eye, gums, or nose -signs and symptoms of a dangerous change in heartbeat or heart rhythm like chest pain; dizziness; fast or irregular heartbeat; palpitations; feeling faint or lightheaded, falls; breathing problems -signs and symptoms of kidney injury like trouble passing urine or change in the amount of urine -signs and symptoms of liver injury like dark yellow or brown urine; general ill feeling or flu-like symptoms; light-colored stools; loss of appetite; nausea; right upper belly pain; unusually weak or tired; yellowing of the eyes or skin -signs and symptoms of low potassium like muscle cramps or muscle pain; chest pain; dizziness; feeling faint or lightheaded, falls; palpitations; breathing problems; or fast, irregular heartbeat -signs and symptoms of a stroke like changes in vision; confusion; trouble speaking or understanding; severe headaches; sudden numbness or weakness of the face, arm or leg; trouble walking; dizziness; loss of balance or coordination -stomach pain -swelling of the legs or ankles -unusually weak or tired Side effects that usually do not require medical attention (Report these to your doctor or health care professional if they continue or are bothersome.): -diarrhea -joint pain -loss of appetite -mouth sores -muscle pain -nausea, vomiting -weight loss This list may not  describe all possible side effects. Call your doctor for medical advice about side effects. You may report side effects to FDA at 1-800-FDA-1088. Where should I keep my medicine? Keep out of the reach of children. Store between 20 and 25 degrees C (68 and 77 degrees F). Throw away any unused medicine after the expiration date. NOTE: This sheet is a summary. It may not cover all possible information. If you have questions about this medicine, talk to your doctor, pharmacist, or health care provider.    2016, [employer]. (2014-11-06 13:27:52)  -history of blood clots -liver disease -stomach problems -thyroid disease -an unusual or allergic reaction to axitinib, other medicines, foods, dyes, or preservatives -pregnant or trying to get pregnant -breast-feeding How should I use this medicine? Take this medicine by mouth with a glass of water. Do not cut, crush, or chew this medicine. Do not take with grapefruit juice. You can take this medicine with or without food. If it upsets your stomach, take it with food. Follow the directions on the prescription label. Take your medicine at regular intervals. Do not take it more often than directed. Do not stop taking except on your doctor's advice. Talk to your pediatrician regarding the use of this medicine in children. Special care may be needed. Overdosage: If you think you have taken too much of this medicine contact a poison control center or emergency room at once. NOTE: This medicine is only for you. Do not share this medicine with others. What if I miss a dose? If  you miss a dose, take it as soon as you can. If it is almost time for your next dose, take only that dose and skip your missed dose. Do not take double or extra doses. What may interact with this medicine? This medicine may interact with the following medications: -antiviral medicines for HIV or AIDS like atazanavir, efavirenz, etravirine, indinavir, nelfinavir, ritonavir,  saquinavir -bosentan -clarithromycin -dexamethasone -grapefruit or grapefruit juice -medicines for fungal infections like ketoconazole, itraconazole, voriconazole -medicines for seizures like carbamazepine, phenobarbital, phenytoin -modafinil -nafcillin -nefazodone -rifabutin -St. John's Wort -telithromycin This list may not describe all possible interactions. Give your health care provider a list of all the medicines, herbs, non-prescription drugs, or dietary supplements you use. Also tell them if you smoke, drink alcohol, or use illegal drugs. Some items may interact with your medicine. What should I watch for while using this medicine? Visit your doctor for checks on your progress. Report any new symptoms promptly. This drug may make you feel generally unwell. This is not uncommon, as chemotherapy can affect healthy cells as well as cancer cells. Report any side effects. Continue your course of treatment even though you feel ill unless your doctor tells you to stop. Be careful brushing and flossing your teeth or using a toothpick because you may get an infection or bleed more easily. If you have any dental work done, tell your dentist you are receiving this medicine. Do not become pregnant while taking this medicine. Women should inform their doctor if they wish to become pregnant or think they might be pregnant. There is a potential for serious side effects to an unborn child. Men should inform their doctors if they wish to father a child. This medicine may lower sperm counts. Talk to your health care professional or pharmacist for more information. Do not breast-feed an infant while taking this medicine. What side effects may I notice from receiving this medicine? Side effects that you should report to your doctor or health care professional as soon as possible: -allergic reactions like skin rash, itching or hives, swelling of the face, lips, or tongue -high blood  pressure -seizures -signs and symptoms of bleeding such as bloody or black, tarry stools; red or dark-brown urine; spitting up blood or brown material that looks like coffee grounds; red spots on the skin; unusual bruising or bleeding from the eye, gums, or nose -signs and symptoms of a blood clot such as breathing problems; changes in vision; chest pain; severe, sudden headache; pain, swelling, warmth in the leg; trouble speaking; sudden numbness or weakness of the face, arm, or leg -stomach pain Side effects that usually do not require medical attention (report these to your doctor or health care professional if they continue or are bothersome): -constipation -cough -diarrhea -loss of appetite -nausea, vomiting This list may not describe all possible side effects. Call your doctor for medical advice about side effects. You may report side effects to FDA at 1-800-FDA-1088. Where should I keep my medicine? Keep out of the reach of children. Store tablets at room temperature between 15 and 30 degrees C (59 and 86 degrees F). Throw away any unused medicine after the expiration date. NOTE: This sheet is a summary. It may not cover all possible information. If you have questions about this medicine, talk to your doctor, pharmacist, or health care provider.    2016, [employer]. (2014-08-12 17:09:30) Cabozantinib capsules and tablets What is this medicine? CABOZANTINIB (KA boe ZAN ti nib) is medicine that targets proteins in  cancer cells and stops the cancer cell from growing. It is used to treat thyroid cancer and renal cell cancer. This medicine may be used for other purposes; ask your health care provider or pharmacist if you have questions. What should I tell my health care provider before I take this medicine? They need to know if you have any of these conditions: -bleeding disorders -high blood pressure -liver disease -recent surgery -skin conditions or sensitivity -an  unusual or allergic reaction to cabozantinib, other medicines, foods, dyes, or preservatives -pregnant or trying to get pregnant -breast-feeding How should I use this medicine? Take this medicine by mouth with a glass of water. Follow the directions on the prescription label. Do not take with food. Do not cut, crush, or chew this medicine. Do not take with grapefruit juice. Take your medicine at regular intervals. Do not take it more often than directed. Do not stop taking except on your doctor's advice. Talk to your pediatrician regarding the use of this medicine in children. Special care may be needed. Overdosage: If you think you have taken too much of this medicine contact a poison control center or emergency room at once. NOTE: This medicine is only for you. Do not share this medicine with others. What if I miss a dose? If you miss a dose, take it as soon as you can. If your next dose is to be taken in less than 12 hours, then do not take the missed dose. Take the next dose at your regular time. Do not take double or extra doses. What may interact with this medicine? This medicine may interact with the following medications: -atazanavir -carbamazepine -clarithromycin -grapefruit juice -indinavir -itraconazole -ketoconazole -nefazodone -nelfinavir -phenobarbital -phenytoin -rifabutin -rifampin -rifapentine -ritonavir -saquinavir -St. John's Wort -telithromycin -voriconazole This list may not describe all possible interactions. Give your health care provider a list of all the medicines, herbs, non-prescription drugs, or dietary supplements you use. Also tell them if you smoke, drink alcohol, or use illegal drugs. Some items may interact with your medicine. What should I watch for while using this medicine? This drug may make you feel generally unwell. This is not uncommon, as chemotherapy can affect healthy cells as well as cancer cells. Report any side effects. Continue your  course of treatment even though you feel ill unless your doctor tells you to stop. If you are going to have surgery or any other procedures, tell your doctor you are taking this medicine. Do not become pregnant while taking this medicine or for 4 months after stopping it. Women should inform their doctor if they wish to become pregnant or think they might be pregnant. Men should not father a child while taking this medicine and for 4 months after stopping it. There is a potential for serious side effects to an unborn child. Talk to your health care professional or pharmacist for more information. Do not breast-feed an infant while taking this medicine or for 4 months after the last dose. What side effects may I notice from receiving this medicine? Side effects that you should report to your doctor or health care professional as soon as possible: -allergic reactions like skin rash, itching or hives, swelling of the face, lips, or tongue -bloody or black, tarry stools -breathing problems -changes in vision -chest pain or chest tightness -confusion, trouble speaking or understanding -feeling faint or lightheaded, falls -jaw pain, especially after dental work -red or dark-brown urine -redness, swelling, or sores on hands or feet -severe headaches -shortness  of breath, chest pain, swelling in a leg -spitting up blood or brown material that looks like coffee grounds -stomach pain -sudden numbness or weakness of the face, arm or leg -sweating -swelling of the ankles, feet, hands -trouble swallowing -trouble walking, dizziness, loss of balance or coordination -unusual bruising or bleeding from the eye, gums, or nose Side effects that usually do not require medical attention (Report these to your doctor or health care professional if they continue or are bothersome.): -diarrhea -loss of appetite -nausea, vomiting -sore throat -weight loss This list may not describe all possible side effects.  Call your doctor for medical advice about side effects. You may report side effects to FDA at 1-800-FDA-1088. Where should I keep my medicine? Keep out of the reach of children. Store between 20 and 25 degrees C (68 and 77 degrees F). Throw away any unused medicine after the expiration date. NOTE: This sheet is a summary. It may not cover all possible information. If you have questions about this medicine, talk to your doctor, pharmacist, or health care provider.    2016, [employer]. (2014-10-16 10:28:49) Nivolumab injection What is this medicine? NIVOLUMAB (nye VOL ue mab) is a monoclonal antibody. It is used to treat melanoma, lung cancer, kidney cancer, and Hodgkin lymphoma. This medicine may be used for other purposes; ask your health care provider or pharmacist if you have questions. What should I tell my health care provider before I take this medicine? They need to know if you have any of these conditions: -diabetes -immune system problems -kidney disease -liver disease -lung disease -organ transplant -stomach or intestine problems -thyroid disease -an unusual or allergic reaction to nivolumab, other medicines, foods, dyes, or preservatives -pregnant or trying to get pregnant -breast-feeding How should I use this medicine? This medicine is for infusion into a vein. It is given by a health care professional in a hospital or clinic setting. A special MedGuide will be given to you before each treatment. Be sure to read this information carefully each time. Talk to your pediatrician regarding the use of this medicine in children. Special care may be needed. Overdosage: If you think you have taken too much of this medicine contact a poison control center or emergency room at once. NOTE: This medicine is only for you. Do not share this medicine with others. What if I miss a dose? It is important not to miss your dose. Call your doctor or health care professional if you  are unable to keep an appointment. What may interact with this medicine? Interactions have not been studied. Give your health care provider a list of all the medicines, herbs, non-prescription drugs, or dietary supplements you use. Also tell them if you smoke, drink alcohol, or use illegal drugs. Some items may interact with your medicine. This list may not describe all possible interactions. Give your health care provider a list of all the medicines, herbs, non-prescription drugs, or dietary supplements you use. Also tell them if you smoke, drink alcohol, or use illegal drugs. Some items may interact with your medicine. What should I watch for while using this medicine? This drug may make you feel generally unwell. Continue your course of treatment even though you feel ill unless your doctor tells you to stop. You may need blood work done while you are taking this medicine. Do not become pregnant while taking this medicine or for 5 months after stopping it. Women should inform their doctor if they wish to become pregnant or think  they might be pregnant. There is a potential for serious side effects to an unborn child. Talk to your health care professional or pharmacist for more information. Do not breast-feed an infant while taking this medicine. What side effects may I notice from receiving this medicine? Side effects that you should report to your doctor or health care professional as soon as possible: -allergic reactions like skin rash, itching or hives, swelling of the face, lips, or tongue -black, tarry stools -blood in the urine -bloody or watery diarrhea -changes in vision -change in sex drive -changes in emotions or moods -chest pain -confusion -cough -decreased appetite -diarrhea -facial flushing -feeling faint or lightheaded -fever, chills -hair loss -hallucination, loss of contact with reality -headache -irritable -joint pain -loss of memory -muscle pain -muscle  weakness -seizures -shortness of breath -signs and symptoms of high blood sugar such as dizziness; dry mouth; dry skin; fruity breath; nausea; stomach pain; increased hunger or thirst; increased urination -signs and symptoms of kidney injury like trouble passing urine or change in the amount of urine -signs and symptoms of liver injury like dark yellow or brown urine; general ill feeling or flu-like symptoms; light-colored stools; loss of appetite; nausea; right upper belly pain; unusually weak or tired; yellowing of the eyes or skin -stiff neck -swelling of the ankles, feet, hands -weight gain Side effects that usually do not require medical attention (report to your doctor or health care professional if they continue or are bothersome): -bone pain -constipation -tiredness -vomiting This list may not describe all possible side effects. Call your doctor for medical advice about side effects. You may report side effects to FDA at 1-800-FDA-1088. Where should I keep my medicine? This drug is given in a hospital or clinic and will not be stored at home. NOTE: This sheet is a summary. It may not cover all possible information. If you have questions about this medicine, talk to your doctor, pharmacist, or health care provider.    2016, [employer]vier/[employer] Standard. (2014-11-04 10:03:42)

## 2015-12-24 NOTE — Progress Notes (Signed)
Patient here for follow up no change in condition. Needs refill for medication Votriant.

## 2015-12-24 NOTE — Progress Notes (Signed)
[facility] day:  12/24/2015   Chief Complaint: Marc Blake is a 60 y.o. male with metastatic renal cell carcinoma who is seen for review of interval studies and discussion regarding direction of therapy.  HPI: The patient was last seen in the medical oncology clinic on 12/09/2015.  At that time, he was seen for initial assessment.  He was diagnosed with stage IV renal cell carcinoma in 12/2013.  Disease consisted of a large left renal mass, possible synchronous right renal mass, pulmonary nodules, and bone metastasis.  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  He had been on Votrient (pazopanib) since 02/13/2014.  He was on Xarelto for his DVT.  Zometa had been put on hold secondary to dental issues.  We discussed restaging studies.  He was referred to urology, orthopedics, and dentistry.   Chest, abdomen, and pelvic CT scan on 12/14/2015 revealed an irregular 10.7 cm heterogeneous renal cortical mass in the upper left kidney.  There was a heterogeneous 2.3 cm renal cortical mass in the lateral lower right kidney, cannot exclude a synchronous right renal cell carcinoma.  There was mild bilateral hydroureteronephrosis with bladder distention.  There was diffuse urothelial wall thickening throughout the bilateral urinary tracts and bladder suggestive of chronic bladder outlet obstruction by the moderately enlarged prostate with superimposed urinary tract infection not excluded.  There was mild left para-aortic lymphadenopathy, suspicious for nodal metastases.  There were numerous (greater than 20) pulmonary metastases.  There was a partially visualized mildly expansile lytic lesion in the left proximal femur status post surgical transfixation.  Thre was a small probably loculated pleural effusion at the left eighth rib resection site.  Bone scan on 12/23/2015  revealed photopenia from prior removal of the left eighth rib. There was increased  uptake in the medial thoracic spine from T7 and T9 is felt to be due to postoperative fusion.  There was increased uptake throughout much of the left femur as well as increased uptake in the soft tissues of much of the left femur which may be due to previous surgery and radiation therapy change. An area of relative decreased attenuation in the proximal left femur is likely of postoperative etiology. There was metallic fixation in a portion of the proximal left femur. Soft tissue uptake in this area most likely is of inflammatory etiology from previous radiation therapy in this area. The radiotracer uptake in other bony structures appeared unremarkable.   He was seen by Dr Skip Estimable of orthopedics on 12/18/2015.  He was noted to have a pathologic fracture of the left femur with evidence of nonunion.  Notes indicate possible use of a bone simulator.  Zometa was also discussed when cleared by dentistry.  The patient was seen by [name].  Patient notes discussion about Xarelto.  The patient has an appointment with urology on 01/10/2016.    Symptomatically, he denies any new complaints.  His last dose of Votrient was 12/22/2015 (ran out).   Past Medical History  Diagnosis Date  . Hypertension   . Cancer [facility])     Renal cell carcinoma  . Depression   . Myocardial infarction ([place])   . Anxiety     Past Surgical History  Procedure Laterality Date  . Back surgery    . Leg surgery Left   . Knee surgery Left     Family History  Problem Relation Age of Onset  . Hypertension Father   . Heart attack Father   . Stroke Sister   . Hypertension Brother   . Stroke Maternal Uncle     Social History:  reports that he has never smoked. He has never used smokeless tobacco. He reports that he does not drink alcohol or use illicit drugs.  He is originally from [place].  He then lived in [place] for 17 years.   He moved to [place] 2 months ago with his wife.  The patient is alone today.  Allergies: No Known Allergies  Current Medications: Current Outpatient Prescriptions  Medication Sig Dispense Refill  . amLODipine (NORVASC) 2.5 MG tablet TAKE 1 TABLET ONCE A DAY FOR 30 DAYS  2  . atorvastatin (LIPITOR) 20 MG tablet TAKE 1 TABLET ONCE A DAY (AT BEDTIME) FOR 30 DAYS  2  . busPIRone (BUSPAR) 5 MG tablet TAKE 1 TABLET 3 TIMES A DAY FOR 30 DAYS  2  . carvedilol (COREG) 12.5 MG tablet TAKE 1 TABLET BY MOUTH TWICE A DAY FOR 30 DAYS  3  . gabapentin (NEURONTIN) 300 MG capsule Take 300 mg by mouth 3 (three) times daily.  2  . lisinopril (PRINIVIL,ZESTRIL) 20 MG tablet TAKE 1 TABLET ONCE A DAY FOR 30 DAYS  3  . morphine (MS CONTIN) 15 MG 12 hr tablet Take 15 mg by mouth every 12 (twelve) hours.  0  . nitrofurantoin (MACRODANTIN) 50 MG capsule TAKE 1 CAPSULE BY MOUTH AT BEDTIME FOR 30 DAYS  3  . oxyCODONE (OXY IR/ROXICODONE) 5 MG immediate release tablet Take 5 mg by mouth every 8 (eight) hours as needed. for pain  0  . pazopanib (VOTRIENT) 200 MG tablet Take 600 mg by mouth daily. Take on an empty stomach.    . sertraline (ZOLOFT) 100 MG tablet Take 100 mg by mouth at bedtime.  1  . tamsulosin (FLOMAX) 0.4 MG CAPS capsule TAKE 2 CAPSULES ONCE A DAY FOR 30 DAY(S)  3  . traZODone (DESYREL) 50 MG tablet Take 25 mg by mouth daily.  3  . XARELTO 20 MG TABS tablet Take 20 mg by mouth daily.  1   No current facility-administered medications for this visit.    Review of Systems:  GENERAL:  Feels good.  No fevers or sweats.  Weight up 2 pounds from baseline. PERFORMANCE STATUS (ECOG):  1 HEENT:  No visual changes, runny nose, sore throat, mouth sores or tenderness. Lungs: No shortness of breath or cough.  No hemoptysis. Cardiac:  No chest pain, palpitations, orthopnea, or PND. GI:  Diarrhea 1 day/week; Probiotics help.  No nausea, vomiting, constipation, melena or hematochezia. GU:  Bladder emptying issues.   No self catheterization in awhile.  No urgency, frequency, dysuria, or hematuria. Musculoskeletal:  No back pain.  No joint pain.  No muscle tenderness. Extremities:  No pain or swelling. Skin:  No rashes or skin changes. Neuro:  No headache, numbness or weakness.  Decreased feeling left leg.  Slight balance problem. Endocrine:  No diabetes, thyroid issues, hot flashes or night sweats. Psych:  No mood changes, depression or anxiety. Pain:  No focal pain. Review of systems:  All other systems reviewed and found to be negative.  Physical Exam: Blood pressure 137/88, pulse 65, temperature 97.8 F (36.6 C), temperature source Tympanic, height _0  (1.905 m), weight 248 lb 2 oz (112.55 kg). GENERAL:  Well developed, well nourished, gentleman sitting comfortably in the exam room in no acute  distress.  He has a rolling walker at his side. MENTAL STATUS:  Alert and oriented to person, place and time. HEAD:  Long gray hair with goatee.  Normocephalic, atraumatic, face symmetric, no Cushingoid features. EYES:  Pupils equal round.  No conjunctivitis or scleral icterus. NEUROLOGICAL: No change in ambulation. PSYCH:  Appropriate.  Marland Kitchen  No visits with results within 3 Day(s) from this visit. Latest known visit with results is:  Appointment on 12/09/2015  Component Date Value Ref Range Status  . WBC 12/09/2015 5.1  3.8 - 10.6 K/uL Final  . RBC 12/09/2015 3.99* 4.40 - 5.90 MIL/uL Final  . Hemoglobin 12/09/2015 12.0* 13.0 - 18.0 g/dL Final  . HCT 12/09/2015 36.2* 40.0 - 52.0 % Final  . MCV 12/09/2015 90.9  80.0 - 100.0 fL Final  . MCH 12/09/2015 30.0  26.0 - 34.0 pg Final  . MCHC 12/09/2015 33.0  32.0 - 36.0 g/dL Final  . RDW 12/09/2015 17.3* 11.5 - 14.5 % Final  . Platelets 12/09/2015 147* 150 - 440 K/uL Final  . Neutrophils Relative % 12/09/2015 76   Final  . Neutro Abs 12/09/2015 3.9  1.4 - 6.5 K/uL Final  . Lymphocytes Relative 12/09/2015 12   Final  . Lymphs Abs 12/09/2015 0.6* 1.0 - 3.6  K/uL Final  . Monocytes Relative 12/09/2015 9   Final  . Monocytes Absolute 12/09/2015 0.5  0.2 - 1.0 K/uL Final  . Eosinophils Relative 12/09/2015 3   Final  . Eosinophils Absolute 12/09/2015 0.1  0 - 0.7 K/uL Final  . Basophils Relative 12/09/2015 0   Final  . Basophils Absolute 12/09/2015 0.0  0 - 0.1 K/uL Final  . Sodium 12/09/2015 138  135 - 145 mmol/L Final  . Potassium 12/09/2015 4.7  3.5 - 5.1 mmol/L Final  . Chloride 12/09/2015 102  101 - 111 mmol/L Final  . CO2 12/09/2015 29  22 - 32 mmol/L Final  . Glucose, Bld 12/09/2015 105* 65 - 99 mg/dL Final  . BUN 12/09/2015 19  6 - 20 mg/dL Final  . Creatinine, Ser 12/09/2015 1.04  0.61 - 1.24 mg/dL Final  . Calcium 12/09/2015 8.8* 8.9 - 10.3 mg/dL Final  . Total Protein 12/09/2015 6.9  6.5 - 8.1 g/dL Final  . Albumin 12/09/2015 3.7  3.5 - 5.0 g/dL Final  . AST 12/09/2015 18  15 - 41 U/L Final  . ALT 12/09/2015 18  17 - 63 U/L Final  . Alkaline Phosphatase 12/09/2015 78  38 - 126 U/L Final  . Total Bilirubin 12/09/2015 1.0  0.3 - 1.2 mg/dL Final  . GFR calc non Af Amer 12/09/2015 >60  >60 mL/min Final  . GFR calc Af Amer 12/09/2015 >60  >60 mL/min Final   Comment: (NOTE) The eGFR has been calculated using the CKD EPI equation. This calculation has not been validated in all clinical situations. eGFR's persistently <60 mL/min signify possible Chronic Kidney Disease.   . Anion gap 12/09/2015 7  5 - 15 Final  . LDH 12/09/2015 141  98 - 192 U/L Final    Assessment:  Marc Blake is a 60 y.o. male with metastatic renal cell carcinoma presenting in 12/2013.  He had a large left renal mass, rib and vertebral body involvement, and multiple pulmonary nodules.  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  He has been on Votrient (pazopanib) since 02/13/2014.  Abdominal and pelvic CT scan on 01/12/2014 revealed an 8.5 x 10.2 x  9.2 cm irregularly enhancing mass of the left kidney with some  internal calcifications. There were enlarged left sided renal hilar lymph nodes. There were multiple pulmonary nodules in both lower lobes (largest 1.1 cm). There was a destructive bone lesion with an irregular 8.6 x 5.1 cm soft tissue mass invasive of the spinal canal narrowing at least 50% and disturbing the pedicle and portions of the left transverse process rib and T8 vertebral body.   Chest CT on 01/15/2014 revealed innumerable pulmonary nodules, a large metastatic lesion at T8 and left eighth rib with invasion into the spinal canal and moderate canal stenosis. There was a left upper pole right kidney mass.  Head MRI was negative.  He received 3000 cGy to T7 - T9 and associated ribs beginning 05/11/2014.  On 06/15/2014, he received 1 dose of nivolumab.  He presented with progressive paralysis from the waist down on 07/06/2014.  He underwent left posterolateral thoracotomy with left chest wall resection with T8 corpectomy and interbody fusion with donor bone on 07/06/2014.  Pathology revealed metastatic renal cell carcinoma. Post-operatively, he developed sepsis secondary to a submandibular abscess dental abscess.  He underwent tracheostomy, I&D of a right neck abscess, tooth extraction, and PEG tube placement.  Zometa has subsequently been on hold.  He developed a pathologic fracture of the left femur.  He is s/p intramedullary nailing on 01/27/2015.  He received radiation to the left leg.  He was admitted on 04/26/2015 with aspiration pneumonia and opioid overdose. He is currently taking MSContin 15 mg BID and oxycodone 5 mg q day.  Chest, abdomen, and pelvic CT scan on 05/31/2015 revealed slight enlargement of multiple pulmonary nodules.  The left renal mass was similar (12.2 x 9.6 cm).  The right kidney lesion was stable (lower pole lesion 2 x 1.7 cm).  Bone scan on 05/31/2015 revealed slight increased activity in the left costal vertebral junction at T7.  Chest, abdomen, and pelvic CT scan on  12/14/2015 revealed an irregular 10.7 cm heterogeneous renal cortical mass in the upper left kidney and a heterogeneous 2.3 cm renal cortical mass in the lateral lower right kidney.  There was mild bilateral hydroureteronephrosis with bladder distention.  There was mild left para-aortic lymphadenopathy.  There were numerous (greater than 20) pulmonary metastases.  Largest nodule was 2.1 cm in the RLL. There was a partially visualized mildly expansile lytic lesion in the left proximal femur status post surgical transfixation.  Thre was a small probably loculated pleural effusion at the left eighth rib resection site.  There were no other bone abnormalities.  Bone scan on 12/23/2015 revealed photopenia from prior removal of the left eighth rib. There was increased  uptake in the medial thoracic spine from T7 and T9 is felt to be due to postoperative fusion.  There was increased uptake throughout much of the left femur as well as increased uptake in the soft tissues of much of the left femur which may be due to previous surgery and radiation therapy change.  He developed a left lower extremity DVT (popliteal and soleal vein) on 06/07/2015.  He is on long term Xarelto.  Patient has a long standing history of an obstructive uropathy and elevated PVR.  He has undergone urethral dilatation on several occasions.  Symptomatically, he feels good.  He has mild left lower extremity weakness.  Plan: 1.  Review imaging studies.  Await outside films for comparison.  Discuss concern for slowly growing pulmonary nodules.  Discuss continuation of Votrient for now.  Discuss consideration of nivolumab, cabozantinib, axitinib or lenvatinib + everolimus.  Information provided.  Discuss with Dr. Rudean Curt issues regarding his prior 1 dose exposure to nivolumab.   2.  Contact Dr. Rudean Curt [phone]) re: nivolumab. 3.  Discuss long term Xarelto given metastatic disease and decreased  mobility. 4.  Assist with dental evaluation.  No Xgeva or Zometa until dental issues resolved. 5.  Assist with Votrient continuation (until anticipated switch made in therapy). 6.  Follow-up with urology on 01/10/2016. 7.  RTC in 1 week for MD assessment and labs (CBC with diff, CMP)   [name], MD  12/24/2015 , 8:39 AM

## 2015-12-26 ENCOUNTER — Encounter: Payer: Self-pay | Admitting: Hematology and Oncology

## 2015-12-28 ENCOUNTER — Ambulatory Visit: Payer: Self-pay

## 2015-12-30 ENCOUNTER — Inpatient Hospital Stay (HOSPITAL_BASED_OUTPATIENT_CLINIC_OR_DEPARTMENT_OTHER): Payer: 59 | Admitting: Hematology and Oncology

## 2015-12-30 ENCOUNTER — Encounter: Payer: Self-pay | Admitting: Hematology and Oncology

## 2015-12-30 ENCOUNTER — Encounter (INDEPENDENT_AMBULATORY_CARE_PROVIDER_SITE_OTHER): Payer: Self-pay

## 2015-12-30 ENCOUNTER — Inpatient Hospital Stay (HOSPITAL_BASED_OUTPATIENT_CLINIC_OR_DEPARTMENT_OTHER): Payer: 59

## 2015-12-30 VITALS — BP 153/87 | HR 59 | Temp 97.9°F | Resp 18 | Wt 251.4 lb

## 2015-12-30 DIAGNOSIS — F419 Anxiety disorder, unspecified: Secondary | ICD-10-CM

## 2015-12-30 DIAGNOSIS — Z86718 Personal history of other venous thrombosis and embolism: Secondary | ICD-10-CM | POA: Diagnosis not present

## 2015-12-30 DIAGNOSIS — C7951 Secondary malignant neoplasm of bone: Secondary | ICD-10-CM

## 2015-12-30 DIAGNOSIS — F329 Major depressive disorder, single episode, unspecified: Secondary | ICD-10-CM

## 2015-12-30 DIAGNOSIS — N400 Enlarged prostate without lower urinary tract symptoms: Secondary | ICD-10-CM

## 2015-12-30 DIAGNOSIS — C642 Malignant neoplasm of left kidney, except renal pelvis: Secondary | ICD-10-CM

## 2015-12-30 DIAGNOSIS — Z79899 Other long term (current) drug therapy: Secondary | ICD-10-CM

## 2015-12-30 DIAGNOSIS — C7800 Secondary malignant neoplasm of unspecified lung: Secondary | ICD-10-CM

## 2015-12-30 DIAGNOSIS — N1330 Unspecified hydronephrosis: Secondary | ICD-10-CM

## 2015-12-30 DIAGNOSIS — Z8781 Personal history of (healed) traumatic fracture: Secondary | ICD-10-CM

## 2015-12-30 DIAGNOSIS — R918 Other nonspecific abnormal finding of lung field: Secondary | ICD-10-CM

## 2015-12-30 DIAGNOSIS — I252 Old myocardial infarction: Secondary | ICD-10-CM

## 2015-12-30 DIAGNOSIS — I10 Essential (primary) hypertension: Secondary | ICD-10-CM

## 2015-12-30 DIAGNOSIS — Z8701 Personal history of pneumonia (recurrent): Secondary | ICD-10-CM

## 2015-12-30 DIAGNOSIS — N3289 Other specified disorders of bladder: Secondary | ICD-10-CM

## 2015-12-30 DIAGNOSIS — Z7901 Long term (current) use of anticoagulants: Secondary | ICD-10-CM

## 2015-12-30 DIAGNOSIS — I82432 Acute embolism and thrombosis of left popliteal vein: Secondary | ICD-10-CM

## 2015-12-30 LAB — COMPREHENSIVE METABOLIC PANEL
ALT: 15 U/L — ABNORMAL LOW (ref 17–63)
AST: 20 U/L (ref 15–41)
Albumin: 3.9 g/dL (ref 3.5–5.0)
Alkaline Phosphatase: 78 U/L (ref 38–126)
Anion gap: 4 — ABNORMAL LOW (ref 5–15)
BUN: 17 mg/dL (ref 6–20)
CO2: 31 mmol/L (ref 22–32)
Calcium: 8.9 mg/dL (ref 8.9–10.3)
Chloride: 106 mmol/L (ref 101–111)
Creatinine, Ser: 1.03 mg/dL (ref 0.61–1.24)
GFR calc Af Amer: >60 mL/min (ref >60)
GFR calc non Af Amer: >60 mL/min (ref >60)
Glucose, Bld: 94 mg/dL (ref 65–99)
Potassium: 4.5 mmol/L (ref 3.5–5.1)
Sodium: 141 mmol/L (ref 135–145)
Total Bilirubin: 0.8 mg/dL (ref 0.3–1.2)
Total Protein: 7.1 g/dL (ref 6.5–8.1)

## 2015-12-30 LAB — CBC WITH DIFFERENTIAL/PLATELET
Basophils Absolute: 0 10*3/uL (ref 0–0.1)
Basophils Relative: 1 %
Eosinophils Absolute: 0.1 10*3/uL (ref 0–0.7)
Eosinophils Relative: 3 %
HCT: 35.6 % — ABNORMAL LOW (ref 40.0–52.0)
Hemoglobin: 11.8 g/dL — ABNORMAL LOW (ref 13.0–18.0)
Lymphocytes Relative: 20 %
Lymphs Abs: 0.8 10*3/uL — ABNORMAL LOW (ref 1.0–3.6)
MCH: 30.2 pg (ref 26.0–34.0)
MCHC: 33.3 g/dL (ref 32.0–36.0)
MCV: 90.9 fL (ref 80.0–100.0)
Monocytes Absolute: 0.4 10*3/uL (ref 0.2–1.0)
Monocytes Relative: 12 %
Neutro Abs: 2.4 10*3/uL (ref 1.4–6.5)
Neutrophils Relative %: 64 %
Platelets: 156 10*3/uL (ref 150–440)
RBC: 3.92 MIL/uL — ABNORMAL LOW (ref 4.40–5.90)
RDW: 17.2 % — ABNORMAL HIGH (ref 11.5–14.5)
WBC: 3.8 10*3/uL (ref 3.8–10.6)

## 2015-12-30 NOTE — Progress Notes (Signed)
[facility] day:  12/30/2015   Chief Complaint: Marc Blake is a 60 y.o. male with metastatic renal cell carcinoma who is seen reassessment.  HPI: The patient was last seen in the medical oncology clinic on 12/24/2015.  At that time, imaging studies were reviewed.  Concern was raised for progressive disease (increasing size pulmonary nodules).  Comparison films from [place] had been requested, but not received.  We discussed other treatment options.    During the interim, he denies any new complaints.  He has not had follow-up with dentistry.  He received his next shipment of Votrient in the mail.  He only missed 2 doses.    Past Medical History  Diagnosis Date  . Hypertension   . Cancer [facility])     Renal cell carcinoma  . Depression   . Myocardial infarction ([place])   . Anxiety     Past Surgical History  Procedure Laterality Date  . Back surgery    . Leg surgery Left   . Knee surgery Left     Family History  Problem Relation Age of Onset  . Hypertension Father   . Heart attack Father   . Stroke Sister   . Hypertension Brother   . Stroke Maternal Uncle     Social History:  reports that he has never smoked. He has never used smokeless tobacco. He reports that he does not drink alcohol or use illicit drugs.  He is originally from [place].  He then lived in [place] for 17 years.  He moved to [place] 2 months ago with his wife.  The patient is accompanied by his wife, Horris Latino, today.  Allergies: No Known Allergies  Current Medications: Current Outpatient Prescriptions  Medication Sig Dispense Refill  . amLODipine (NORVASC) 2.5 MG tablet TAKE 1 TABLET ONCE A DAY FOR 30 DAYS  2  . atorvastatin (LIPITOR) 20 MG tablet TAKE 1 TABLET ONCE A DAY (AT BEDTIME) FOR 30 DAYS  2  . busPIRone (BUSPAR) 5 MG tablet TAKE 1 TABLET 3 TIMES A DAY FOR 30 DAYS  2  . carvedilol (COREG) 12.5 MG tablet TAKE 1 TABLET BY MOUTH TWICE A DAY FOR 30  DAYS  3  . gabapentin (NEURONTIN) 300 MG capsule Take 300 mg by mouth 3 (three) times daily.  2  . gabapentin (NEURONTIN) 300 MG capsule Take by mouth.    Marland Kitchen lisinopril (PRINIVIL,ZESTRIL) 20 MG tablet TAKE 1 TABLET ONCE A DAY FOR 30 DAYS  3  . morphine (MS CONTIN) 15 MG 12 hr tablet Take 15 mg by mouth every 12 (twelve) hours.  0  . nitrofurantoin (MACRODANTIN) 50 MG capsule TAKE 1 CAPSULE BY MOUTH AT BEDTIME FOR 30 DAYS  3  . oxyCODONE (OXY IR/ROXICODONE) 5 MG immediate release tablet Take 5 mg by mouth every 8 (eight) hours as needed. for pain  0  . oxyCODONE (OXY IR/ROXICODONE) 5 MG immediate release tablet Take by mouth.    . pazopanib (VOTRIENT) 200 MG tablet Take 600 mg by mouth daily. Take on an empty stomach.    . senna (SENOKOT) 8.6 MG tablet Take by mouth.    . sertraline (ZOLOFT) 100 MG tablet Take 100 mg by mouth at bedtime.  1  . tamsulosin (FLOMAX) 0.4 MG CAPS capsule TAKE 2 CAPSULES ONCE A DAY FOR 30 DAY(S)  3  . traZODone (DESYREL) 50 MG tablet Take 25 mg by mouth daily.  3  . XARELTO 20 MG TABS tablet  Take 20 mg by mouth daily.  1   No current facility-administered medications for this visit.    Review of Systems:  GENERAL:  Feels good.  No fevers or sweats.  Weight up 3 pounds from last visit. PERFORMANCE STATUS (ECOG):  1 HEENT:  No visual changes, runny nose, sore throat, mouth sores or tenderness. Lungs: No shortness of breath or cough.  No hemoptysis. Cardiac:  No chest pain, palpitations, orthopnea, or PND. GI:  Diarrhea 1 day/week; Probiotics help.  No nausea, vomiting, constipation, melena or hematochezia. GU:  Bladder emptying issues.  No self catheterization in awhile.  No urgency, frequency, dysuria, or hematuria. Musculoskeletal:  No back pain.  No joint pain.  No muscle tenderness. Extremities:  No pain or swelling. Skin:  No rashes or skin changes. Neuro:  No headache, numbness or weakness.  Decreased feeling left leg.  Slight balance problem. Endocrine:   No diabetes, thyroid issues, hot flashes or night sweats. Psych:  No mood changes, depression or anxiety. Pain:  No focal pain. Review of systems:  All other systems reviewed and found to be negative.  Physical Exam: Blood pressure 153/87, pulse 59, temperature 97.9 F (36.6 C), temperature source Tympanic, resp. rate 18, weight 251 lb 7 oz (114.05 kg). GENERAL:  Well developed, well nourished, gentleman sitting comfortably in the exam room in no acute distress.  He has a cane at his side. MENTAL STATUS:  Alert and oriented to person, place and time. HEAD:  Long gray hair with goatee.  Normocephalic, atraumatic, face symmetric, no Cushingoid features. EYES:  Pupils equal round.  No conjunctivitis or scleral icterus. NEUROLOGICAL: No change in ambulation. PSYCH:  Appropriate.  Marland Kitchen  Appointment on 12/30/2015  Component Date Value Ref Range Status  . WBC 12/30/2015 3.8  3.8 - 10.6 K/uL Final  . RBC 12/30/2015 3.92* 4.40 - 5.90 MIL/uL Final  . Hemoglobin 12/30/2015 11.8* 13.0 - 18.0 g/dL Final  . HCT 12/30/2015 35.6* 40.0 - 52.0 % Final  . MCV 12/30/2015 90.9  80.0 - 100.0 fL Final  . MCH 12/30/2015 30.2  26.0 - 34.0 pg Final  . MCHC 12/30/2015 33.3  32.0 - 36.0 g/dL Final  . RDW 12/30/2015 17.2* 11.5 - 14.5 % Final  . Platelets 12/30/2015 156  150 - 440 K/uL Final  . Neutrophils Relative % 12/30/2015 64   Final  . Neutro Abs 12/30/2015 2.4  1.4 - 6.5 K/uL Final  . Lymphocytes Relative 12/30/2015 20   Final  . Lymphs Abs 12/30/2015 0.8* 1.0 - 3.6 K/uL Final  . Monocytes Relative 12/30/2015 12   Final  . Monocytes Absolute 12/30/2015 0.4  0.2 - 1.0 K/uL Final  . Eosinophils Relative 12/30/2015 3   Final  . Eosinophils Absolute 12/30/2015 0.1  0 - 0.7 K/uL Final  . Basophils Relative 12/30/2015 1   Final  . Basophils Absolute 12/30/2015 0.0  0 - 0.1 K/uL Final  . Sodium 12/30/2015 141  135 - 145 mmol/L Final  . Potassium 12/30/2015 4.5  3.5 - 5.1 mmol/L Final  . Chloride 12/30/2015 106   101 - 111 mmol/L Final  . CO2 12/30/2015 31  22 - 32 mmol/L Final  . Glucose, Bld 12/30/2015 94  65 - 99 mg/dL Final  . BUN 12/30/2015 17  6 - 20 mg/dL Final  . Creatinine, Ser 12/30/2015 1.03  0.61 - 1.24 mg/dL Final  . Calcium 12/30/2015 8.9  8.9 - 10.3 mg/dL Final  . Total Protein 12/30/2015 7.1  6.5 -  8.1 g/dL Final  . Albumin 12/30/2015 3.9  3.5 - 5.0 g/dL Final  . AST 12/30/2015 20  15 - 41 U/L Final  . ALT 12/30/2015 15* 17 - 63 U/L Final  . Alkaline Phosphatase 12/30/2015 78  38 - 126 U/L Final  . Total Bilirubin 12/30/2015 0.8  0.3 - 1.2 mg/dL Final  . GFR calc non Af Amer 12/30/2015 >60  >60 mL/min Final  . GFR calc Af Amer 12/30/2015 >60  >60 mL/min Final   Comment: (NOTE) The eGFR has been calculated using the CKD EPI equation. This calculation has not been validated in all clinical situations. eGFR's persistently <60 mL/min signify possible Chronic Kidney Disease.   . Anion gap 12/30/2015 4* 5 - 15 Final    Assessment:  Marc Blake is a 60 y.o. male with metastatic renal cell carcinoma presenting in 12/2013.  He had a large left renal mass, rib and vertebral body involvement, and multiple pulmonary nodules.  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  He has been on Votrient (pazopanib) since 02/13/2014.  Abdominal and pelvic CT scan on 01/12/2014 revealed an 8.5 x 10.2 x 9.2 cm irregularly enhancing mass of the left kidney with some internal calcifications. There were enlarged left sided renal hilar lymph nodes. There were multiple pulmonary nodules in both lower lobes (largest 1.1 cm). There was a destructive bone lesion with an irregular 8.6 x 5.1 cm soft tissue mass invasive of the spinal canal narrowing at least 50% and disturbing the pedicle and portions of the left transverse process rib and T8 vertebral body.   Chest CT on 01/15/2014 revealed innumerable pulmonary nodules, a large metastatic lesion at T8 and left  eighth rib with invasion into the spinal canal and moderate canal stenosis. There was a left upper pole right kidney mass.  Head MRI was negative.  He received 3000 cGy to T7 - T9 and associated ribs beginning 05/11/2014.  On 06/15/2014, he received 1 dose of nivolumab.  He presented with progressive paralysis from the waist down on 07/06/2014.  He underwent left posterolateral thoracotomy with left chest wall resection with T8 corpectomy and interbody fusion with donor bone on 07/06/2014.  Pathology revealed metastatic renal cell carcinoma. Post-operatively, he developed sepsis secondary to a submandibular abscess dental abscess.  He underwent tracheostomy, I&D of a right neck abscess, tooth extraction, and PEG tube placement.  Zometa has subsequently been on hold.  He developed a pathologic fracture of the left femur.  He is s/p intramedullary nailing on 01/27/2015.  He received radiation to the left leg.  He was admitted on 04/26/2015 with aspiration pneumonia and opioid overdose. He is currently taking MSContin 15 mg BID and oxycodone 5 mg q day.  Chest, abdomen, and pelvic CT scan on 05/31/2015 revealed slight enlargement of multiple pulmonary nodules.  The left renal mass was similar (12.2 x 9.6 cm).  The right kidney lesion was stable (lower pole lesion 2 x 1.7 cm).  Bone scan on 05/31/2015 revealed slight increased activity in the left costal vertebral junction at T7.  Chest, abdomen, and pelvic CT scan on 12/14/2015 revealed an irregular 10.7 cm heterogeneous renal cortical mass in the upper left kidney and a heterogeneous 2.3 cm renal cortical mass in the lateral lower right kidney.  There was mild bilateral hydroureteronephrosis with bladder distention.  There was mild left para-aortic lymphadenopathy.  There were numerous (greater than 20) pulmonary metastases.  Largest nodule was 2.1  cm in the RLL. There was a partially visualized mildly expansile lytic lesion in the left proximal femur  status post surgical transfixation.  Thre was a small probably loculated pleural effusion at the left eighth rib resection site.  There were no other bone abnormalities.  Bone scan on 12/23/2015 revealed photopenia from prior removal of the left eighth rib. There was increased  uptake in the medial thoracic spine from T7 and T9 is felt to be due to postoperative fusion.  There was increased uptake throughout much of the left femur as well as increased uptake in the soft tissues of much of the left femur which may be due to previous surgery and radiation therapy change.  He developed a left lower extremity DVT (popliteal and soleal vein) on 06/07/2015.  He is on long term Xarelto.  Patient has a long standing history of an obstructive uropathy and elevated PVR.  He has undergone urethral dilatation on several occasions.  Symptomatically, he feels good.  He has mild left lower extremity weakness.  Plan: 1.  Labs today:  CBC with diff, CMP. 2.  Review current situation with patient's wife.  Suspect progressive pulmonary nodules.  Await outside films for comparison (requests have been made).  Discuss requesting Dr. Azzie Roup to assist with obtaining radiology disks.  Message left with her office.  Discuss continuation of Votrient for now.  Discuss consideration of nivolumab.  Await follow-up conversation with Dr. Azzie Roup about 1 dose in her clinic and no further doses. 3.  Message left with Dr. Rudean Curt [phone]) re: nivolumab and radiology disks. 4.  Review long term Xarelto given metastatic disease and decreased mobility. 5.  Discuss difficulties with obtaining dental evaluation.  Patient has no dental insurance and major medical will not cover.  No Xgeva or Zometa until dental issues resolved. 6.  Continue Votrient (30 day supply received 12/25/2015). 7.  Follow-up with urology on 01/10/2016. 8.  Preauth nivolumab. 9.  Chemotherapy class. 10.  RTC in 2 weeks for MD  assessment, labs (CBC with diff, CMP, Mg, TSH), and nivolumab if imaging and prior dosing resolved.   [name], MD  12/30/2015 , 3:30 PM

## 2015-12-30 NOTE — Progress Notes (Signed)
Patient is here for follow up. Just waiting to hear about information from other oncology doctor.

## 2016-01-02 ENCOUNTER — Encounter: Payer: Self-pay | Admitting: Hematology and Oncology

## 2016-01-04 ENCOUNTER — Ambulatory Visit: Payer: 59

## 2016-01-10 ENCOUNTER — Encounter: Payer: Self-pay | Admitting: Urology

## 2016-01-10 ENCOUNTER — Ambulatory Visit: Payer: 59 | Admitting: Urology

## 2016-01-10 ENCOUNTER — Telehealth: Payer: Self-pay | Admitting: *Deleted

## 2016-01-10 DIAGNOSIS — R339 Retention of urine, unspecified: Secondary | ICD-10-CM | POA: Insufficient documentation

## 2016-01-10 DIAGNOSIS — N1330 Unspecified hydronephrosis: Secondary | ICD-10-CM | POA: Insufficient documentation

## 2016-01-10 NOTE — Telephone Encounter (Signed)
Called pt after checking with [facility] and they felt that with

## 2016-01-11 ENCOUNTER — Inpatient Hospital Stay: Payer: 59

## 2016-01-11 NOTE — Patient Instructions (Signed)
Nivolumab injection What is this medicine? NIVOLUMAB (nye VOL ue mab) is a monoclonal antibody. It is used to treat melanoma, lung cancer, kidney cancer, and Hodgkin lymphoma. This medicine may be used for other purposes; ask your health care provider or pharmacist if you have questions. What should I tell my health care provider before I take this medicine? They need to know if you have any of these conditions: -diabetes -immune system problems -kidney disease -liver disease -lung disease -organ transplant -stomach or intestine problems -thyroid disease -an unusual or allergic reaction to nivolumab, other medicines, foods, dyes, or preservatives -pregnant or trying to get pregnant -breast-feeding How should I use this medicine? This medicine is for infusion into a vein. It is given by a health care professional in a hospital or clinic setting. A special MedGuide will be given to you before each treatment. Be sure to read this information carefully each time. Talk to your pediatrician regarding the use of this medicine in children. Special care may be needed. Overdosage: If you think you have taken too much of this medicine contact a poison control center or emergency room at once. NOTE: This medicine is only for you. Do not share this medicine with others. What if I miss a dose? It is important not to miss your dose. Call your doctor or health care professional if you are unable to keep an appointment. What may interact with this medicine? Interactions have not been studied. Give your health care provider a list of all the medicines, herbs, non-prescription drugs, or dietary supplements you use. Also tell them if you smoke, drink alcohol, or use illegal drugs. Some items may interact with your medicine. This list may not describe all possible interactions. Give your health care provider a list of all the medicines, herbs, non-prescription drugs, or dietary supplements you use. Also tell  them if you smoke, drink alcohol, or use illegal drugs. Some items may interact with your medicine. What should I watch for while using this medicine? This drug may make you feel generally unwell. Continue your course of treatment even though you feel ill unless your doctor tells you to stop. You may need blood work done while you are taking this medicine. Do not become pregnant while taking this medicine or for 5 months after stopping it. Women should inform their doctor if they wish to become pregnant or think they might be pregnant. There is a potential for serious side effects to an unborn child. Talk to your health care professional or pharmacist for more information. Do not breast-feed an infant while taking this medicine. What side effects may I notice from receiving this medicine? Side effects that you should report to your doctor or health care professional as soon as possible: -allergic reactions like skin rash, itching or hives, swelling of the face, lips, or tongue -black, tarry stools -blood in the urine -bloody or watery diarrhea -changes in vision -change in sex drive -changes in emotions or moods -chest pain -confusion -cough -decreased appetite -diarrhea -facial flushing -feeling faint or lightheaded -fever, chills -hair loss -hallucination, loss of contact with reality -headache -irritable -joint pain -loss of memory -muscle pain -muscle weakness -seizures -shortness of breath -signs and symptoms of high blood sugar such as dizziness; dry mouth; dry skin; fruity breath; nausea; stomach pain; increased hunger or thirst; increased urination -signs and symptoms of kidney injury like trouble passing urine or change in the amount of urine -signs and symptoms of liver injury like dark yellow or  brown urine; general ill feeling or flu-like symptoms; light-colored stools; loss of appetite; nausea; right upper belly pain; unusually weak or tired; yellowing of the eyes or  skin -stiff neck -swelling of the ankles, feet, hands -weight gain Side effects that usually do not require medical attention (report to your doctor or health care professional if they continue or are bothersome): -bone pain -constipation -tiredness -vomiting This list may not describe all possible side effects. Call your doctor for medical advice about side effects. You may report side effects to FDA at 1-800-FDA-1088. Where should I keep my medicine? This drug is given in a hospital or clinic and will not be stored at home. NOTE: This sheet is a summary. It may not cover all possible information. If you have questions about this medicine, talk to your doctor, pharmacist, or health care provider.    2016, [employer]vier/Gold Standard. (2014-11-04 10:03:42)

## 2016-01-13 ENCOUNTER — Inpatient Hospital Stay: Payer: 59

## 2016-01-13 ENCOUNTER — Inpatient Hospital Stay: Payer: 59 | Admitting: Hematology and Oncology

## 2016-01-13 ENCOUNTER — Other Ambulatory Visit: Payer: Self-pay | Admitting: Hematology and Oncology

## 2016-01-18 ENCOUNTER — Inpatient Hospital Stay: Payer: 59

## 2016-01-19 DIAGNOSIS — E782 Mixed hyperlipidemia: Secondary | ICD-10-CM | POA: Insufficient documentation

## 2016-01-19 DIAGNOSIS — I2510 Atherosclerotic heart disease of native coronary artery without angina pectoris: Secondary | ICD-10-CM | POA: Insufficient documentation

## 2016-01-19 DIAGNOSIS — R0602 Shortness of breath: Secondary | ICD-10-CM | POA: Insufficient documentation

## 2016-01-20 ENCOUNTER — Inpatient Hospital Stay: Payer: 59 | Attending: Hematology and Oncology

## 2016-01-20 DIAGNOSIS — Z86718 Personal history of other venous thrombosis and embolism: Secondary | ICD-10-CM | POA: Insufficient documentation

## 2016-01-20 DIAGNOSIS — I10 Essential (primary) hypertension: Secondary | ICD-10-CM | POA: Insufficient documentation

## 2016-01-20 DIAGNOSIS — F329 Major depressive disorder, single episode, unspecified: Secondary | ICD-10-CM | POA: Insufficient documentation

## 2016-01-20 DIAGNOSIS — Z7901 Long term (current) use of anticoagulants: Secondary | ICD-10-CM | POA: Insufficient documentation

## 2016-01-20 DIAGNOSIS — Z8701 Personal history of pneumonia (recurrent): Secondary | ICD-10-CM | POA: Insufficient documentation

## 2016-01-20 DIAGNOSIS — J90 Pleural effusion, not elsewhere classified: Secondary | ICD-10-CM | POA: Insufficient documentation

## 2016-01-20 DIAGNOSIS — R21 Rash and other nonspecific skin eruption: Secondary | ICD-10-CM | POA: Insufficient documentation

## 2016-01-20 DIAGNOSIS — Z8781 Personal history of (healed) traumatic fracture: Secondary | ICD-10-CM | POA: Insufficient documentation

## 2016-01-20 DIAGNOSIS — N3289 Other specified disorders of bladder: Secondary | ICD-10-CM | POA: Insufficient documentation

## 2016-01-20 DIAGNOSIS — R531 Weakness: Secondary | ICD-10-CM | POA: Insufficient documentation

## 2016-01-20 DIAGNOSIS — C642 Malignant neoplasm of left kidney, except renal pelvis: Secondary | ICD-10-CM | POA: Insufficient documentation

## 2016-01-20 DIAGNOSIS — Z5112 Encounter for antineoplastic immunotherapy: Secondary | ICD-10-CM | POA: Insufficient documentation

## 2016-01-20 DIAGNOSIS — I252 Old myocardial infarction: Secondary | ICD-10-CM | POA: Insufficient documentation

## 2016-01-20 DIAGNOSIS — N139 Obstructive and reflux uropathy, unspecified: Secondary | ICD-10-CM | POA: Insufficient documentation

## 2016-01-20 DIAGNOSIS — Z79899 Other long term (current) drug therapy: Secondary | ICD-10-CM | POA: Insufficient documentation

## 2016-01-20 DIAGNOSIS — C7800 Secondary malignant neoplasm of unspecified lung: Secondary | ICD-10-CM | POA: Insufficient documentation

## 2016-01-20 DIAGNOSIS — F419 Anxiety disorder, unspecified: Secondary | ICD-10-CM | POA: Insufficient documentation

## 2016-01-20 DIAGNOSIS — C7951 Secondary malignant neoplasm of bone: Secondary | ICD-10-CM | POA: Insufficient documentation

## 2016-01-21 ENCOUNTER — Inpatient Hospital Stay (HOSPITAL_BASED_OUTPATIENT_CLINIC_OR_DEPARTMENT_OTHER): Payer: 59 | Admitting: Hematology and Oncology

## 2016-01-21 ENCOUNTER — Inpatient Hospital Stay: Payer: 59

## 2016-01-21 ENCOUNTER — Other Ambulatory Visit: Payer: Self-pay | Admitting: *Deleted

## 2016-01-21 VITALS — BP 119/79 | HR 74 | Temp 98.1°F | Resp 18 | Wt 245.2 lb

## 2016-01-21 DIAGNOSIS — F329 Major depressive disorder, single episode, unspecified: Secondary | ICD-10-CM

## 2016-01-21 DIAGNOSIS — Z7901 Long term (current) use of anticoagulants: Secondary | ICD-10-CM | POA: Diagnosis not present

## 2016-01-21 DIAGNOSIS — C642 Malignant neoplasm of left kidney, except renal pelvis: Secondary | ICD-10-CM

## 2016-01-21 DIAGNOSIS — I252 Old myocardial infarction: Secondary | ICD-10-CM | POA: Diagnosis not present

## 2016-01-21 DIAGNOSIS — I82432 Acute embolism and thrombosis of left popliteal vein: Secondary | ICD-10-CM

## 2016-01-21 DIAGNOSIS — N3289 Other specified disorders of bladder: Secondary | ICD-10-CM

## 2016-01-21 DIAGNOSIS — F419 Anxiety disorder, unspecified: Secondary | ICD-10-CM

## 2016-01-21 DIAGNOSIS — I10 Essential (primary) hypertension: Secondary | ICD-10-CM | POA: Diagnosis not present

## 2016-01-21 DIAGNOSIS — C7800 Secondary malignant neoplasm of unspecified lung: Secondary | ICD-10-CM

## 2016-01-21 DIAGNOSIS — Z5112 Encounter for antineoplastic immunotherapy: Secondary | ICD-10-CM | POA: Diagnosis not present

## 2016-01-21 DIAGNOSIS — Z79899 Other long term (current) drug therapy: Secondary | ICD-10-CM

## 2016-01-21 DIAGNOSIS — N139 Obstructive and reflux uropathy, unspecified: Secondary | ICD-10-CM | POA: Diagnosis not present

## 2016-01-21 DIAGNOSIS — Z8781 Personal history of (healed) traumatic fracture: Secondary | ICD-10-CM | POA: Diagnosis not present

## 2016-01-21 DIAGNOSIS — C7951 Secondary malignant neoplasm of bone: Secondary | ICD-10-CM

## 2016-01-21 DIAGNOSIS — R21 Rash and other nonspecific skin eruption: Secondary | ICD-10-CM

## 2016-01-21 DIAGNOSIS — R531 Weakness: Secondary | ICD-10-CM | POA: Diagnosis not present

## 2016-01-21 DIAGNOSIS — Z8701 Personal history of pneumonia (recurrent): Secondary | ICD-10-CM

## 2016-01-21 DIAGNOSIS — J90 Pleural effusion, not elsewhere classified: Secondary | ICD-10-CM

## 2016-01-21 DIAGNOSIS — Z86718 Personal history of other venous thrombosis and embolism: Secondary | ICD-10-CM

## 2016-01-21 LAB — COMPREHENSIVE METABOLIC PANEL
ALT: 17 U/L (ref 17–63)
AST: 20 U/L (ref 15–41)
Albumin: 4.3 g/dL (ref 3.5–5.0)
Alkaline Phosphatase: 68 U/L (ref 38–126)
Anion gap: 7 (ref 5–15)
BUN: 21 mg/dL — ABNORMAL HIGH (ref 6–20)
CO2: 25 mmol/L (ref 22–32)
Calcium: 9 mg/dL (ref 8.9–10.3)
Chloride: 107 mmol/L (ref 101–111)
Creatinine, Ser: 1.08 mg/dL (ref 0.61–1.24)
GFR calc Af Amer: >60 mL/min (ref >60)
GFR calc non Af Amer: >60 mL/min (ref >60)
Glucose, Bld: 107 mg/dL — ABNORMAL HIGH (ref 65–99)
Potassium: 4.1 mmol/L (ref 3.5–5.1)
Sodium: 139 mmol/L (ref 135–145)
Total Bilirubin: 0.8 mg/dL (ref 0.3–1.2)
Total Protein: 7.2 g/dL (ref 6.5–8.1)

## 2016-01-21 LAB — CBC WITH DIFFERENTIAL/PLATELET
Basophils Absolute: 0 10*3/uL (ref 0–0.1)
Basophils Relative: 1 %
Eosinophils Absolute: 0.1 10*3/uL (ref 0–0.7)
Eosinophils Relative: 2 %
HCT: 37.8 % — ABNORMAL LOW (ref 40.0–52.0)
Hemoglobin: 12.6 g/dL — ABNORMAL LOW (ref 13.0–18.0)
Lymphocytes Relative: 17 %
Lymphs Abs: 1 10*3/uL (ref 1.0–3.6)
MCH: 30.8 pg (ref 26.0–34.0)
MCHC: 33.3 g/dL (ref 32.0–36.0)
MCV: 92.7 fL (ref 80.0–100.0)
Monocytes Absolute: 0.7 10*3/uL (ref 0.2–1.0)
Monocytes Relative: 11 %
Neutro Abs: 4.4 10*3/uL (ref 1.4–6.5)
Neutrophils Relative %: 69 %
Platelets: 150 10*3/uL (ref 150–440)
RBC: 4.08 MIL/uL — ABNORMAL LOW (ref 4.40–5.90)
RDW: 17.2 % — ABNORMAL HIGH (ref 11.5–14.5)
WBC: 6.3 10*3/uL (ref 3.8–10.6)

## 2016-01-21 LAB — TSH: TSH: 5.946 u[IU]/mL — ABNORMAL HIGH (ref 0.350–4.500)

## 2016-01-21 LAB — MAGNESIUM: Magnesium: 1.9 mg/dL (ref 1.7–2.4)

## 2016-01-21 NOTE — Progress Notes (Signed)
[facility] day:  01/21/16   Chief Complaint: Marc Blake is a 60 y.o. male with metastatic renal cell carcinoma who is seen for 2 week assessment.  HPI: The patient was last seen in the medical oncology clinic on 12/30/2015.  At that time, he felt good.  He had mild chronic left lower extremity weakness.  Outside films for comparison had not arrived.  By review of old reports, disease appeared to have increased.  We discussed potential other treatment options.  Outside films were obtained (chest, abdomen, and pelvic CT scan on 05/31/2015) and compared to recent films.  There was a mixed response in the pulmonary metastasis, some of which appear decreased in size and others of which appear new or slightly increased. For example, a 1.6 x 1.4 cm medial right upper lobe pulmonary nodule is slightly increased from 1.4 x 1.3 cm.  A basilar right lower lobe 2.1 x 1.6 cm nodule appeared new.  A 1.4 x 1.3 cm posterior left lower lobe nodule (series 4/image 94) appears slightly decreased from 1.5 x 1.3 cm.The small probably loculated pleural effusion at the left eighth rib resection site appears decreased in size.  During the interim, he denies any new complaints.  He has an appointment at [facility] dentistry on 01/26/2016.  He attended the chemotherapy class.  He continues his Votrient (5 left).     Past Medical History:  Diagnosis Date  . Anxiety   . Depression   . Hypertension   . Myocardial infarction ([place])   . Renal cell carcinoma [facility])     Past Surgical History:  Procedure Laterality Date  . BACK SURGERY    . KNEE SURGERY Left   . LEG SURGERY Left     Family History  Problem Relation Age of Onset  . Hypertension Father   . Heart attack Father   . Stroke Sister   . Hypertension Brother   . Stroke Maternal Uncle     Social History:  reports that he has never smoked. He has never used smokeless tobacco. He reports that he does not drink  alcohol or use drugs.  He is originally from [place].  He then lived in [place] for 17 years.  He moved to [place] 2 months ago with his wife.  The patient is alone today.  Allergies: No Known Allergies  Current Medications: Current Outpatient Prescriptions  Medication Sig Dispense Refill  . amLODipine (NORVASC) 2.5 MG tablet TAKE 1 TABLET ONCE A DAY FOR 30 DAYS  2  . atorvastatin (LIPITOR) 20 MG tablet TAKE 1 TABLET ONCE A DAY (AT BEDTIME) FOR 30 DAYS  2  . busPIRone (BUSPAR) 5 MG tablet TAKE 1 TABLET 3 TIMES A DAY FOR 30 DAYS  2  . carvedilol (COREG) 12.5 MG tablet TAKE 1 TABLET BY MOUTH TWICE A DAY FOR 30 DAYS  3  . gabapentin (NEURONTIN) 300 MG capsule Take by mouth.    Marland Kitchen lisinopril (PRINIVIL,ZESTRIL) 20 MG tablet TAKE 1 TABLET ONCE A DAY FOR 30 DAYS  3  . morphine (MS CONTIN) 15 MG 12 hr tablet Take 15 mg by mouth every 12 (twelve) hours.  0  . nitrofurantoin (MACRODANTIN) 50 MG capsule TAKE 1 CAPSULE BY MOUTH AT BEDTIME FOR 30 DAYS  3  . oxyCODONE (OXY IR/ROXICODONE) 5 MG immediate release tablet Take by mouth.    . pazopanib (VOTRIENT) 200 MG tablet Take 600 mg by mouth daily. Take on an empty stomach.    Marland Kitchen  senna (SENOKOT) 8.6 MG tablet Take by mouth.    . sertraline (ZOLOFT) 100 MG tablet Take 100 mg by mouth at bedtime.  1  . tamsulosin (FLOMAX) 0.4 MG CAPS capsule TAKE 2 CAPSULES ONCE A DAY FOR 30 DAY(S)  3  . traZODone (DESYREL) 50 MG tablet Take 25 mg by mouth daily.  3  . XARELTO 20 MG TABS tablet Take 20 mg by mouth daily.  1   No current facility-administered medications for this visit.     Review of Systems:  GENERAL:  Feels good.  No fevers or sweats.  Weight up 3 pounds from last visit. PERFORMANCE STATUS (ECOG):  1 HEENT:  No visual changes, runny nose, sore throat, mouth sores or tenderness. Lungs: No shortness of breath or cough.  No hemoptysis. Cardiac:  No chest pain, palpitations, orthopnea, or PND. GI:  Diarrhea 1 day/week; Probiotics help.  No nausea,  vomiting, constipation, melena or hematochezia. GU:  Bladder emptying issues.  No self catheterization in awhile.  No urgency, frequency, dysuria, or hematuria. Musculoskeletal:  No back pain.  No joint pain.  No muscle tenderness. Extremities:  No pain or swelling. Skin:  No rashes or skin changes. Neuro:  No headache, numbness or weakness.  Decreased feeling left leg.  Slight balance problem. Endocrine:  No diabetes, thyroid issues, hot flashes or night sweats. Psych:  No mood changes, depression or anxiety. Pain:  No focal pain. Review of systems:  All other systems reviewed and found to be negative.  Physical Exam: Blood pressure 119/79, pulse 74, temperature 98.1 F (36.7 C), temperature source Tympanic, resp. rate 18, weight 245 lb 2.4 oz (111.2 kg). GENERAL:  Well developed, well nourished, gentleman sitting comfortably in the exam room in no acute distress.  He has a cane at his side. MENTAL STATUS:  Alert and oriented to person, place and time. HEAD:  Long gray hair with goatee.  Normocephalic, atraumatic, face symmetric, no Cushingoid features. EYES:  Pupils equal round and reactive to light and accomodation.  No conjunctivitis or scleral icterus. ENT:  Oropharynx clear without lesion.  Poor dentition.  Tongue normal. Mucous membranes moist.  RESPIRATORY:  Clear to auscultation without rales, wheezes or rhonchi. CHEST WALL:  Well healed incision on left posterior chest wall.  CARDIOVASCULAR:  Regular rate and rhythm without murmur, rub or gallop. ABDOMEN:  Soft, non-tender, with active bowel sounds, and no hepatosplenomegaly.  No masses. SKIN:  Tattoos.  Right flank rash, patchy, non vesicular.  EXTREMITIES: Chronic lower extremity edema (left > right).  Noskin discoloration or tenderness.  No palpable cords. LYMPH NODES: No palpable cervical, supraclavicular, axillary or inguinal adenopathy  NEUROLOGICAL:  Stable.  No change in ambulation. PSYCH:  Appropriate.  Marland Kitchen  Appointment  on 01/21/2016  Component Date Value Ref Range Status  . WBC 01/21/2016 6.3  3.8 - 10.6 K/uL Final  . RBC 01/21/2016 4.08* 4.40 - 5.90 MIL/uL Final  . Hemoglobin 01/21/2016 12.6* 13.0 - 18.0 g/dL Final  . HCT 91/97/9439 37.8* 40.0 - 52.0 % Final  . MCV 01/21/2016 92.7  80.0 - 100.0 fL Final  . MCH 01/21/2016 30.8  26.0 - 34.0 pg Final  . MCHC 01/21/2016 33.3  32.0 - 36.0 g/dL Final  . RDW 12/99/0205 17.2* 11.5 - 14.5 % Final  . Platelets 01/21/2016 150  150 - 440 K/uL Final  . Neutrophils Relative % 01/21/2016 69  % Final  . Neutro Abs 01/21/2016 4.4  1.4 - 6.5 K/uL Final  . Lymphocytes  Relative 01/21/2016 17  % Final  . Lymphs Abs 01/21/2016 1.0  1.0 - 3.6 K/uL Final  . Monocytes Relative 01/21/2016 11  % Final  . Monocytes Absolute 01/21/2016 0.7  0.2 - 1.0 K/uL Final  . Eosinophils Relative 01/21/2016 2  % Final  . Eosinophils Absolute 01/21/2016 0.1  0 - 0.7 K/uL Final  . Basophils Relative 01/21/2016 1  % Final  . Basophils Absolute 01/21/2016 0.0  0 - 0.1 K/uL Final  . Sodium 01/21/2016 139  135 - 145 mmol/L Final  . Potassium 01/21/2016 4.1  3.5 - 5.1 mmol/L Final  . Chloride 01/21/2016 107  101 - 111 mmol/L Final  . CO2 01/21/2016 25  22 - 32 mmol/L Final  . Glucose, Bld 01/21/2016 107* 65 - 99 mg/dL Final  . BUN 01/21/2016 21* 6 - 20 mg/dL Final  . Creatinine, Ser 01/21/2016 1.08  0.61 - 1.24 mg/dL Final  . Calcium 01/21/2016 9.0  8.9 - 10.3 mg/dL Final  . Total Protein 01/21/2016 7.2  6.5 - 8.1 g/dL Final  . Albumin 01/21/2016 4.3  3.5 - 5.0 g/dL Final  . AST 01/21/2016 20  15 - 41 U/L Final  . ALT 01/21/2016 17  17 - 63 U/L Final  . Alkaline Phosphatase 01/21/2016 68  38 - 126 U/L Final  . Total Bilirubin 01/21/2016 0.8  0.3 - 1.2 mg/dL Final  . GFR calc non Af Amer 01/21/2016 >60  >60 mL/min Final  . GFR calc Af Amer 01/21/2016 >60  >60 mL/min Final   Comment: (NOTE) The eGFR has been calculated using the CKD EPI equation. This calculation has not been validated in  all clinical situations. eGFR's persistently <60 mL/min signify possible Chronic Kidney Disease.   . Anion gap 01/21/2016 7  5 - 15 Final  . Magnesium 01/21/2016 1.9  1.7 - 2.4 mg/dL Final    Assessment:  Marc Blake is a 60 y.o. male with metastatic renal cell carcinoma presenting in 12/2013.  He had a large left renal mass, rib and vertebral body involvement, and multiple pulmonary nodules.  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  He has been on Votrient (pazopanib) since 02/13/2014.  Abdominal and pelvic CT scan on 01/12/2014 revealed an 8.5 x 10.2 x 9.2 cm irregularly enhancing mass of the left kidney with some internal calcifications. There were enlarged left sided renal hilar lymph nodes. There were multiple pulmonary nodules in both lower lobes (largest 1.1 cm). There was a destructive bone lesion with an irregular 8.6 x 5.1 cm soft tissue mass invasive of the spinal canal narrowing at least 50% and disturbing the pedicle and portions of the left transverse process rib and T8 vertebral body.   Chest CT on 01/15/2014 revealed innumerable pulmonary nodules, a large metastatic lesion at T8 and left eighth rib with invasion into the spinal canal and moderate canal stenosis. There was a left upper pole right kidney mass.  Head MRI was negative.  He received 3000 cGy to T7 - T9 and associated ribs beginning 05/11/2014.  On 06/15/2014, he received 1 dose of nivolumab.  He presented with progressive paralysis from the waist down on 07/06/2014.  He underwent left posterolateral thoracotomy with left chest wall resection with T8 corpectomy and interbody fusion with donor bone on 07/06/2014.  Pathology revealed metastatic renal cell carcinoma. Post-operatively, he developed sepsis secondary to a submandibular abscess dental abscess.  He underwent tracheostomy, I&D of a right neck  abscess, tooth extraction, and PEG tube placement.  Zometa has  subsequently been on hold.  He developed a pathologic fracture of the left femur.  He is s/p intramedullary nailing on 01/27/2015.  He received radiation to the left leg.  He was admitted on 04/26/2015 with aspiration pneumonia and opioid overdose. He is currently taking MSContin 15 mg BID and oxycodone 5 mg q day.  Chest, abdomen, and pelvic CT scan on 05/31/2015 revealed slight enlargement of multiple pulmonary nodules.  The left renal mass was similar (12.2 x 9.6 cm).  The right kidney lesion was stable (lower pole lesion 2 x 1.7 cm).  Bone scan on 05/31/2015 revealed slight increased activity in the left costal vertebral junction at T7.  Chest, abdomen, and pelvic CT scan on 12/14/2015 revealed an irregular 10.7 cm heterogeneous renal cortical mass in the upper left kidney and a heterogeneous 2.3 cm renal cortical mass in the lateral lower right kidney.  There was mild bilateral hydroureteronephrosis with bladder distention.  There was mild left para-aortic lymphadenopathy.  There were numerous (greater than 20) pulmonary metastases.  Largest nodule was 2.1 cm in the RLL. There was a partially visualized mildly expansile lytic lesion in the left proximal femur status post surgical transfixation.  Thre was a small probably loculated pleural effusion at the left eighth rib resection site.  There were no other bone abnormalities.  Bone scan on 12/23/2015 revealed photopenia from prior removal of the left eighth rib. There was increased  uptake in the medial thoracic spine from T7 and T9 is felt to be due to postoperative fusion.  There was increased uptake throughout much of the left femur as well as increased uptake in the soft tissues of much of the left femur which may be due to previous surgery and radiation therapy change.  He developed a left lower extremity DVT (popliteal and soleal vein) on 06/07/2015.  He is on long term Xarelto.  Patient has a long standing history of an obstructive uropathy  and elevated PVR.  He has undergone urethral dilatation on several occasions.  Symptomatically, he denies any new complaints.  He has an irregular non-painful right flank rash.  Plan: 1.  Labs today:  CBC with diff, CMP, Mg. 2.  Stop Votrient. 3.  Discuss plans to postpone chemotherapy today secondary to continuation of Votrient (1/2 life 31 hours), unclear flank rash, and review of recent scans submitted by radiology in [place].  Discuss scans from 05/2015 and comparison to recent images.  There is a mixed response with some lesions smaller and some new (lung lesions).  Discuss conversation with Dr. Azzie Roup- no reaction to nivolumab. 4.  Review 2015 scans with radiology (just received). 5.  Follow rash.  Patient to call if rash becomes painful or develops vesicles for institution of acyclovir/valacyclovir. 6.  Follow-up with [facility] dentistry next week. 7.  RTC in 1 week for brief MD assessment, labs (CBC with diff, CMP, Mg) and nivolumab   [name], MD  01/21/2016 , 3:00 PM

## 2016-01-21 NOTE — Progress Notes (Signed)
Patient is here today by himself, he mentions no pain in legs today he is doing well.

## 2016-01-23 ENCOUNTER — Encounter: Payer: Self-pay | Admitting: Hematology and Oncology

## 2016-01-24 ENCOUNTER — Other Ambulatory Visit: Payer: Self-pay | Admitting: *Deleted

## 2016-01-24 DIAGNOSIS — R7989 Other specified abnormal findings of blood chemistry: Secondary | ICD-10-CM

## 2016-01-24 LAB — T4, FREE: Free T4: 0.68 ng/dL (ref 0.61–1.12)

## 2016-01-27 ENCOUNTER — Other Ambulatory Visit: Payer: Self-pay | Admitting: Hematology and Oncology

## 2016-01-27 ENCOUNTER — Inpatient Hospital Stay
Admission: RE | Admit: 2016-01-27 | Discharge: 2016-01-27 | Disposition: A | Discharge status: Home / Self Care | Payer: Self-pay | Source: Ambulatory Visit | Attending: Hematology and Oncology | Admitting: Hematology and Oncology

## 2016-01-27 DIAGNOSIS — C649 Malignant neoplasm of unspecified kidney, except renal pelvis: Secondary | ICD-10-CM

## 2016-01-28 ENCOUNTER — Other Ambulatory Visit: Payer: Self-pay

## 2016-01-28 ENCOUNTER — Other Ambulatory Visit: Payer: Self-pay | Admitting: Hematology and Oncology

## 2016-01-28 ENCOUNTER — Inpatient Hospital Stay (HOSPITAL_BASED_OUTPATIENT_CLINIC_OR_DEPARTMENT_OTHER): Payer: 59 | Admitting: Hematology and Oncology

## 2016-01-28 ENCOUNTER — Inpatient Hospital Stay: Payer: 59

## 2016-01-28 ENCOUNTER — Other Ambulatory Visit: Payer: Self-pay | Admitting: *Deleted

## 2016-01-28 VITALS — BP 109/67 | HR 66 | Temp 97.4°F | Resp 18 | Wt 249.7 lb

## 2016-01-28 DIAGNOSIS — N3289 Other specified disorders of bladder: Secondary | ICD-10-CM

## 2016-01-28 DIAGNOSIS — J90 Pleural effusion, not elsewhere classified: Secondary | ICD-10-CM

## 2016-01-28 DIAGNOSIS — C642 Malignant neoplasm of left kidney, except renal pelvis: Secondary | ICD-10-CM

## 2016-01-28 DIAGNOSIS — C7951 Secondary malignant neoplasm of bone: Secondary | ICD-10-CM | POA: Diagnosis not present

## 2016-01-28 DIAGNOSIS — Z8701 Personal history of pneumonia (recurrent): Secondary | ICD-10-CM

## 2016-01-28 DIAGNOSIS — R531 Weakness: Secondary | ICD-10-CM

## 2016-01-28 DIAGNOSIS — R21 Rash and other nonspecific skin eruption: Secondary | ICD-10-CM

## 2016-01-28 DIAGNOSIS — Z79899 Other long term (current) drug therapy: Secondary | ICD-10-CM

## 2016-01-28 DIAGNOSIS — C7800 Secondary malignant neoplasm of unspecified lung: Secondary | ICD-10-CM | POA: Diagnosis not present

## 2016-01-28 DIAGNOSIS — F329 Major depressive disorder, single episode, unspecified: Secondary | ICD-10-CM

## 2016-01-28 DIAGNOSIS — I10 Essential (primary) hypertension: Secondary | ICD-10-CM

## 2016-01-28 DIAGNOSIS — Z7901 Long term (current) use of anticoagulants: Secondary | ICD-10-CM

## 2016-01-28 DIAGNOSIS — Z86718 Personal history of other venous thrombosis and embolism: Secondary | ICD-10-CM

## 2016-01-28 DIAGNOSIS — Z8781 Personal history of (healed) traumatic fracture: Secondary | ICD-10-CM

## 2016-01-28 DIAGNOSIS — F419 Anxiety disorder, unspecified: Secondary | ICD-10-CM

## 2016-01-28 DIAGNOSIS — I252 Old myocardial infarction: Secondary | ICD-10-CM

## 2016-01-28 DIAGNOSIS — N139 Obstructive and reflux uropathy, unspecified: Secondary | ICD-10-CM

## 2016-01-28 LAB — COMPREHENSIVE METABOLIC PANEL
ALT: 13 U/L — ABNORMAL LOW (ref 17–63)
AST: 18 U/L (ref 15–41)
Albumin: 3.6 g/dL (ref 3.5–5.0)
Alkaline Phosphatase: 72 U/L (ref 38–126)
Anion gap: 7 (ref 5–15)
BUN: 20 mg/dL (ref 6–20)
CO2: 27 mmol/L (ref 22–32)
Calcium: 8.8 mg/dL — ABNORMAL LOW (ref 8.9–10.3)
Chloride: 108 mmol/L (ref 101–111)
Creatinine, Ser: 1.09 mg/dL (ref 0.61–1.24)
GFR calc Af Amer: >60 mL/min (ref >60)
GFR calc non Af Amer: >60 mL/min (ref >60)
Glucose, Bld: 163 mg/dL — ABNORMAL HIGH (ref 65–99)
Potassium: 4.4 mmol/L (ref 3.5–5.1)
Sodium: 142 mmol/L (ref 135–145)
Total Bilirubin: 0.4 mg/dL (ref 0.3–1.2)
Total Protein: 6.4 g/dL — ABNORMAL LOW (ref 6.5–8.1)

## 2016-01-28 LAB — CBC WITH DIFFERENTIAL/PLATELET
Basophils Absolute: 0 10*3/uL (ref 0–0.1)
Basophils Relative: 1 %
Eosinophils Absolute: 0.1 10*3/uL (ref 0–0.7)
Eosinophils Relative: 2 %
HCT: 34.5 % — ABNORMAL LOW (ref 40.0–52.0)
Hemoglobin: 11.2 g/dL — ABNORMAL LOW (ref 13.0–18.0)
Lymphocytes Relative: 15 %
Lymphs Abs: 0.6 10*3/uL — ABNORMAL LOW (ref 1.0–3.6)
MCH: 30.6 pg (ref 26.0–34.0)
MCHC: 32.5 g/dL (ref 32.0–36.0)
MCV: 94 fL (ref 80.0–100.0)
Monocytes Absolute: 0.4 10*3/uL (ref 0.2–1.0)
Monocytes Relative: 10 %
Neutro Abs: 3 10*3/uL (ref 1.4–6.5)
Neutrophils Relative %: 74 %
Platelets: 136 10*3/uL — ABNORMAL LOW (ref 150–440)
RBC: 3.67 MIL/uL — ABNORMAL LOW (ref 4.40–5.90)
RDW: 18.3 % — ABNORMAL HIGH (ref 11.5–14.5)
WBC: 4.1 10*3/uL (ref 3.8–10.6)

## 2016-01-28 LAB — MAGNESIUM: Magnesium: 1.9 mg/dL (ref 1.7–2.4)

## 2016-01-28 MED ORDER — SODIUM CHLORIDE 0.9 % IV SOLN
240.0000 mg | Freq: Once | INTRAVENOUS | Status: AC
  Administered 2016-01-28: 240 mg via INTRAVENOUS
  Filled 2016-01-28: qty 20

## 2016-01-28 MED ORDER — SODIUM CHLORIDE 0.9 % IV SOLN
Freq: Once | INTRAVENOUS | Status: AC
  Administered 2016-01-28: 12:00:00 via INTRAVENOUS
  Filled 2016-01-28: qty 1000

## 2016-01-28 MED ORDER — SODIUM CHLORIDE 0.9% FLUSH
10.0000 mL | INTRAVENOUS | Status: DC | PRN
  Filled 2016-01-28: qty 10

## 2016-01-28 MED ORDER — HEPARIN SOD (PORK) LOCK FLUSH 100 UNIT/ML IV SOLN: 500.0000 [IU] | Freq: Once | INTRAVENOUS | Status: DC | PRN

## 2016-01-28 NOTE — Progress Notes (Signed)
[facility] day:  01/28/16   Chief Complaint: Marc Blake is a 60 y.o. male with metastatic renal cell carcinoma who is seen for assessment prior to initiation of nivolumab.  HPI: The patient was last seen in the medical oncology clinic on 008/09/2015.  At that time, he felt good.  He had a rash on his flank that did not appear consistent with shingles.  Last scans from [place] were reviewed. [place] scans from 2015 were just obtained.  He was to meet with [facility] dentistry.  He stopped Votrient.  The patient states that he met with the dentistry. He states that he was told he needs "everything out" at the cost is $6000 ($180 per month x 3 years).  He is considering his options. He is looking into affordable dentures.   During the interim, he denies any new complaints.  He denies any bone pain.   Past Medical History:  Diagnosis Date  . Anxiety   . Depression   . Hypertension   . Myocardial infarction ([facility])   . Renal cell carcinoma [facility])     Past Surgical History:  Procedure Laterality Date  . BACK SURGERY    . KNEE SURGERY Left   . LEG SURGERY Left     Family History  Problem Relation Age of Onset  . Hypertension Father   . Heart attack Father   . Stroke Sister   . Hypertension Brother   . Stroke Maternal Uncle     Social History:  reports that he has never smoked. He has never used smokeless tobacco. He reports that he does not drink alcohol or use drugs.  He is originally from [place].  He then lived in [place] for 17 years.  He moved to [place] 2 months ago with his wife.  The patient is alone today.  Allergies: No Known Allergies  Current Medications: Current Outpatient Prescriptions  Medication Sig Dispense Refill  . amLODipine (NORVASC) 2.5 MG tablet TAKE 1 TABLET ONCE A DAY FOR 30 DAYS  2  . atorvastatin (LIPITOR) 20 MG tablet TAKE 1 TABLET ONCE A DAY (AT BEDTIME) FOR 30 DAYS  2  . busPIRone (BUSPAR) 5 MG  tablet TAKE 1 TABLET 3 TIMES A DAY FOR 30 DAYS  2  . carvedilol (COREG) 12.5 MG tablet TAKE 1 TABLET BY MOUTH TWICE A DAY FOR 30 DAYS  3  . gabapentin (NEURONTIN) 300 MG capsule Take by mouth.    Marland Kitchen lisinopril (PRINIVIL,ZESTRIL) 20 MG tablet TAKE 1 TABLET ONCE A DAY FOR 30 DAYS  3  . morphine (MS CONTIN) 15 MG 12 hr tablet Take 15 mg by mouth every 12 (twelve) hours.  0  . nitrofurantoin (MACRODANTIN) 50 MG capsule TAKE 1 CAPSULE BY MOUTH AT BEDTIME FOR 30 DAYS  3  . oxyCODONE (OXY IR/ROXICODONE) 5 MG immediate release tablet Take by mouth.    . senna (SENOKOT) 8.6 MG tablet Take by mouth.    . sertraline (ZOLOFT) 100 MG tablet Take 100 mg by mouth at bedtime.  1  . tamsulosin (FLOMAX) 0.4 MG CAPS capsule TAKE 2 CAPSULES ONCE A DAY FOR 30 DAY(S)  3  . traZODone (DESYREL) 50 MG tablet Take 25 mg by mouth daily.  3  . XARELTO 20 MG TABS tablet Take 20 mg by mouth daily.  1   No current facility-administered medications for this visit.     Review of Systems:  GENERAL:  Feels good.  No fevers  or sweats.  Weight up 4 pounds from last visit. PERFORMANCE STATUS (ECOG):  1 HEENT:  No visual changes, runny nose, sore throat, mouth sores or tenderness. Lungs: No shortness of breath or cough.  No hemoptysis. Cardiac:  No chest pain, palpitations, orthopnea, or PND. GI:  Appetite 75%.  No nausea, vomiting, constipation, melena or hematochezia. GU:  Bladder emptying issues.  No urgency, frequency, dysuria, or hematuria. Musculoskeletal:  No back pain.  No joint pain.  No muscle tenderness. Extremities:  No pain or swelling. Skin:  No rashes or skin changes. Neuro:  No headache, numbness or weakness.  Decreased feeling left leg.  Slight balance problem. Endocrine:  No diabetes, thyroid issues, hot flashes or night sweats. Psych:  No mood changes, depression or anxiety. Pain:  No focal pain. Review of systems:  All other systems reviewed and found to be negative.  Physical Exam: Blood pressure  109/67, pulse 66, temperature 97.4 F (36.3 C), temperature source Tympanic, resp. rate 18, weight 249 lb 10.7 oz (113.3 kg). GENERAL:  Well developed, well nourished, gentleman sitting comfortably in the exam room in no acute distress.  He has a cane at his side. MENTAL STATUS:  Alert and oriented to person, place and time. HEAD:  Long gray hair with goatee.  Normocephalic, atraumatic, face symmetric, no Cushingoid features. EYES:  Pupils equal round and reactive to light and accomodation.  No conjunctivitis or scleral icterus. ENT:  Oropharynx clear without lesion.  Poor dentition.  Tongue normal. Mucous membranes moist.  RESPIRATORY:  Clear to auscultation without rales, wheezes or rhonchi. CHEST WALL:  Well healed incision on left posterior chest wall.  CARDIOVASCULAR:  Regular rate and rhythm without murmur, rub or gallop. ABDOMEN:  Soft, non-tender, with active bowel sounds, and no hepatosplenomegaly.  No masses. SKIN:  Tattoos.  Right flank rash, patchy, slightly scaly rash (stable to improved)  EXTREMITIES: Chronic lower extremity edema (left > right).  No skin discoloration or tenderness.  No palpable cords. LYMPH NODES: No palpable cervical, supraclavicular, axillary or inguinal adenopathy  NEUROLOGICAL:  Stable.  No change in ambulation. PSYCH:  Appropriate.  .  Orders Only on 01/28/2016  Component Date Value Ref Range Status  . WBC 01/28/2016 4.1  3.8 - 10.6 K/uL Final  . RBC 01/28/2016 3.67* 4.40 - 5.90 MIL/uL Final  . Hemoglobin 01/28/2016 11.2* 13.0 - 18.0 g/dL Final  . HCT 01/28/2016 34.5* 40.0 - 52.0 % Final  . MCV 01/28/2016 94.0  80.0 - 100.0 fL Final  . MCH 01/28/2016 30.6  26.0 - 34.0 pg Final  . MCHC 01/28/2016 32.5  32.0 - 36.0 g/dL Final  . RDW 01/28/2016 18.3* 11.5 - 14.5 % Final  . Platelets 01/28/2016 136* 150 - 440 K/uL Final  . Neutrophils Relative % 01/28/2016 74  % Final  . Neutro Abs 01/28/2016 3.0  1.4 - 6.5 K/uL Final  . Lymphocytes Relative 01/28/2016  15  % Final  . Lymphs Abs 01/28/2016 0.6* 1.0 - 3.6 K/uL Final  . Monocytes Relative 01/28/2016 10  % Final  . Monocytes Absolute 01/28/2016 0.4  0.2 - 1.0 K/uL Final  . Eosinophils Relative 01/28/2016 2  % Final  . Eosinophils Absolute 01/28/2016 0.1  0 - 0.7 K/uL Final  . Basophils Relative 01/28/2016 1  % Final  . Basophils Absolute 01/28/2016 0.0  0 - 0.1 K/uL Final  . Sodium 01/28/2016 142  135 - 145 mmol/L Final  . Potassium 01/28/2016 4.4  3.5 - 5.1 mmol/L Final  .  Chloride 01/28/2016 108  101 - 111 mmol/L Final  . CO2 01/28/2016 27  22 - 32 mmol/L Final  . Glucose, Bld 01/28/2016 163* 65 - 99 mg/dL Final  . BUN 01/28/2016 20  6 - 20 mg/dL Final  . Creatinine, Ser 01/28/2016 1.09  0.61 - 1.24 mg/dL Final  . Calcium 01/28/2016 8.8* 8.9 - 10.3 mg/dL Final  . Total Protein 01/28/2016 6.4* 6.5 - 8.1 g/dL Final  . Albumin 01/28/2016 3.6  3.5 - 5.0 g/dL Final  . AST 01/28/2016 18  15 - 41 U/L Final  . ALT 01/28/2016 13* 17 - 63 U/L Final  . Alkaline Phosphatase 01/28/2016 72  38 - 126 U/L Final  . Total Bilirubin 01/28/2016 0.4  0.3 - 1.2 mg/dL Final  . GFR calc non Af Amer 01/28/2016 >60  >60 mL/min Final  . GFR calc Af Amer 01/28/2016 >60  >60 mL/min Final   Comment: (NOTE) The eGFR has been calculated using the CKD EPI equation. This calculation has not been validated in all clinical situations. eGFR's persistently <60 mL/min signify possible Chronic Kidney Disease.   . Anion gap 01/28/2016 7  5 - 15 Final  . Magnesium 01/28/2016 1.9  1.7 - 2.4 mg/dL Final    Assessment:  ROLLY MAGRI is a 60 y.o. male with metastatic renal cell carcinoma presenting in 12/2013.  He had a large left renal mass, rib and vertebral body involvement, and multiple pulmonary nodules.  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  He has been on Votrient (pazopanib) since 02/13/2014.  Abdominal and pelvic CT scan on 01/12/2014 revealed an 8.5 x 10.2 x  9.2 cm irregularly enhancing mass of the left kidney with some internal calcifications. There were enlarged left sided renal hilar lymph nodes. There were multiple pulmonary nodules in both lower lobes (largest 1.1 cm). There was a destructive bone lesion with an irregular 8.6 x 5.1 cm soft tissue mass invasive of the spinal canal narrowing at least 50% and disturbing the pedicle and portions of the left transverse process rib and T8 vertebral body.   Chest CT on 01/15/2014 revealed innumerable pulmonary nodules, a large metastatic lesion at T8 and left eighth rib with invasion into the spinal canal and moderate canal stenosis. There was a left upper pole right kidney mass.  Head MRI was negative.  He received 3000 cGy to T7 - T9 and associated ribs beginning 05/11/2014.  On 06/15/2014, he received 1 dose of nivolumab.  He presented with progressive paralysis from the waist down on 07/06/2014.  He underwent left posterolateral thoracotomy with left chest wall resection with T8 corpectomy and interbody fusion with donor bone on 07/06/2014.  Pathology revealed metastatic renal cell carcinoma. Post-operatively, he developed sepsis secondary to a submandibular abscess dental abscess.  He underwent tracheostomy, I&D of a right neck abscess, tooth extraction, and PEG tube placement.  Zometa has subsequently been on hold.  He developed a pathologic fracture of the left femur.  He is s/p intramedullary nailing on 01/27/2015.  He received radiation to the left leg.  He was admitted on 04/26/2015 with aspiration pneumonia and opioid overdose. He is currently taking MSContin 15 mg BID and oxycodone 5 mg q day.  Chest, abdomen, and pelvic CT scan on 05/31/2015 revealed slight enlargement of multiple pulmonary nodules.  The left renal mass was similar (12.2 x 9.6 cm).  The right kidney lesion was stable (lower pole lesion 2 x 1.7 cm).  Bone scan on 05/31/2015 revealed slight increased activity in the left costal  vertebral junction at T7.  Chest, abdomen, and pelvic CT scan on 12/14/2015 revealed an irregular 10.7 cm heterogeneous renal cortical mass in the upper left kidney and a heterogeneous 2.3 cm renal cortical mass in the lateral lower right kidney.  There was mild bilateral hydroureteronephrosis with bladder distention.  There was mild left para-aortic lymphadenopathy.  There were numerous (greater than 20) pulmonary metastases.  Largest nodule was 2.1 cm in the RLL. There was a partially visualized mildly expansile lytic lesion in the left proximal femur status post surgical transfixation.  Thre was a small probably loculated pleural effusion at the left eighth rib resection site.  There were no other bone abnormalities.  Bone scan on 12/23/2015 revealed photopenia from prior removal of the left eighth rib. There was increased  uptake in the medial thoracic spine from T7 and T9 is felt to be due to postoperative fusion.  There was increased uptake throughout much of the left femur as well as increased uptake in the soft tissues of much of the left femur which may be due to previous surgery and radiation therapy change.  He developed a left lower extremity DVT (popliteal and soleal vein) on 06/07/2015.  He is on long term Xarelto.  Patient has a long standing history of an obstructive uropathy and elevated PVR.  He has undergone urethral dilatation on several occasions.  Symptomatically, he denies any new complaints.  He has an irregular non-painful right flank rash.  Plan: 1.  Labs today:  CBC with diff, CMP, Mg. 2.  Review scans with radiology- done.  Discuss comparison of old and new images (2015 to present).  There is a mixed response in the lung with some decreased in size, but others increased and new nodules.  There is a new 2.1 cm right lower lobe nodule. 3.  Discuss plan for nivolumab every 2 weeks.  Side effects reviewed.  Patient consented to treatment. 4.  Discuss dental issues.  Discuss no  plans for Xgeva until cleared by dentistry. 5.  Cycle #1 nivolumab. 6.  RTC in 2 weeks for MD assessment, labs (CBC with dif, CMP, Mg), and cycle #2 nivolumab.   [name], MD  01/28/2016 , 11:45 AM

## 2016-02-01 ENCOUNTER — Ambulatory Visit (INDEPENDENT_AMBULATORY_CARE_PROVIDER_SITE_OTHER): Payer: 59 | Admitting: Urology

## 2016-02-01 ENCOUNTER — Other Ambulatory Visit: Payer: Self-pay | Admitting: Radiology

## 2016-02-01 ENCOUNTER — Encounter: Payer: Self-pay | Admitting: Urology

## 2016-02-01 VITALS — BP 101/62 | HR 72 | Ht 75.0 in | Wt 262.3 lb

## 2016-02-01 DIAGNOSIS — C7951 Secondary malignant neoplasm of bone: Secondary | ICD-10-CM

## 2016-02-01 DIAGNOSIS — R339 Retention of urine, unspecified: Secondary | ICD-10-CM

## 2016-02-01 DIAGNOSIS — C7800 Secondary malignant neoplasm of unspecified lung: Secondary | ICD-10-CM

## 2016-02-01 DIAGNOSIS — C642 Malignant neoplasm of left kidney, except renal pelvis: Secondary | ICD-10-CM | POA: Diagnosis not present

## 2016-02-01 DIAGNOSIS — N400 Enlarged prostate without lower urinary tract symptoms: Secondary | ICD-10-CM | POA: Diagnosis not present

## 2016-02-01 DIAGNOSIS — N99111 Postprocedural bulbous urethral stricture: Secondary | ICD-10-CM | POA: Diagnosis not present

## 2016-02-01 DIAGNOSIS — N35919 Unspecified urethral stricture, male, unspecified site: Secondary | ICD-10-CM | POA: Insufficient documentation

## 2016-02-01 DIAGNOSIS — Z86718 Personal history of other venous thrombosis and embolism: Secondary | ICD-10-CM | POA: Insufficient documentation

## 2016-02-01 LAB — URINALYSIS, COMPLETE
Bilirubin, UA: NEGATIVE
Glucose, UA: NEGATIVE
Ketones, UA: NEGATIVE
NITRITE UA: NEGATIVE
SPEC GRAV UA: 1.025 (ref 1.005–1.030)
Urobilinogen, Ur: 0.2 mg/dL (ref 0.2–1.0)
pH, UA: 5.5 (ref 5.0–7.5)

## 2016-02-01 LAB — MICROSCOPIC EXAMINATION
Bacteria, UA: NONE SEEN
Epithelial Cells (non renal): NONE SEEN /hpf (ref 0–10)
RBC, UA: NONE SEEN /hpf (ref 0–?)

## 2016-02-01 MED ORDER — GENTAMICIN SULFATE 40 MG/ML IJ SOLN: 80.0000 mg | INTRAVENOUS | Status: DC

## 2016-02-01 MED ORDER — SULFAMETHOXAZOLE-TRIMETHOPRIM 800-160 MG PO TABS: 1.0000 | ORAL_TABLET | Freq: Two times a day (BID) | ORAL | 0 refills | Status: DC

## 2016-02-01 NOTE — Progress Notes (Signed)
02/01/2016 8:30 AM   Elease Etienne [birth year] [id number]  Referring provider: [name], MD [facility] [place], [place] [address]  No chief complaint on file.   HPI:  1 - Metastatic Renal Cell Carcinoma - initial DX 2015 in [place] with large left 12cm and right 3cm masses as well as multifocal pulmonary and bone metastasis. Votrient (TKI) initially, then transitioned to Nivolomab (PD-1 inhibitor) 12/2015 for progression. Has had spine radiation and surgery for cord compression.  Recent sumarized Course -  12/2015 - CT approx stable left and right renal masses, lung and bone mets --> nivolomab per cancer center Dr. [name].   2 - Prostatic Hypertrophy - s/p TURP 2005 and 2010 previously in [place].  3 - Urethral Stricture - s/p dilation most recently 2015 in [place]. Recurrence of what appears to be high grade but short segments bulbar stricture by cysto 01/2016.   4 - Incomplete Bladder Emptying - very large bladder distension noted on CT 2017 and PVR's 95m range. No recent cysto of bladder funcitonal studies. He has h/o spine surgery / compression (possible neurogenic course) as well and obstruction as per above. Cr <1.0 and no hydro 2017.  PMH for DVT / xarelto (has come off for procedures, no DVT at present), GERD.   Today " RSigmund" is seen as new patient for above. [place] [facility] after his kids all out of the house.  Cysto today with bulbar sricture.    PMH: Past Medical History:  Diagnosis Date  . Anxiety   . Depression   . Hypertension   . [place]   . Renal cell carcinoma ([facility]     Surgical History: Past Surgical History:  Procedure Laterality Date  . BACK SURGERY    . KNEE SURGERY Left   . LEG SURGERY Left     Home Medications:    Medication List       Accurate as of 02/01/16  8:30 AM. Always use your most recent med list.          amLODipine 2.5 MG  tablet Commonly known as:  NORVASC TAKE 1 TABLET ONCE A DAY FOR 30 DAYS   atorvastatin 20 MG tablet Commonly known as:  LIPITOR TAKE 1 TABLET ONCE A DAY (AT BEDTIME) FOR 30 DAYS   busPIRone 5 MG tablet Commonly known as:  BUSPAR TAKE 1 TABLET 3 TIMES A DAY FOR 30 DAYS   carvedilol 12.5 MG tablet Commonly known as:  COREG TAKE 1 TABLET BY MOUTH TWICE A DAY FOR 30 DAYS   gabapentin 300 MG capsule Commonly known as:  NEURONTIN Take by mouth.   lisinopril 20 MG tablet Commonly known as:  PRINIVIL,ZESTRIL TAKE 1 TABLET ONCE A DAY FOR 30 DAYS   morphine 15 MG 12 hr tablet Commonly known as:  MS CONTIN Take 15 mg by mouth every 12 (twelve) hours.   nitrofurantoin 50 MG capsule Commonly known as:  MACRODANTIN TAKE 1 CAPSULE BY MOUTH AT BEDTIME FOR 30 DAYS   oxyCODONE 5 MG immediate release tablet Commonly known as:  Oxy IR/ROXICODONE Take by mouth.   senna 8.6 MG tablet Commonly known as:  SENOKOT Take by mouth.   sertraline 100 MG tablet Commonly known as:  ZOLOFT Take 100 mg by mouth at bedtime.   tamsulosin 0.4 MG Caps capsule Commonly known as:  FLOMAX TAKE 2 CAPSULES ONCE A DAY FOR 30 DAY(S)   traZODone 50 MG  tablet Commonly known as:  DESYREL Take 25 mg by mouth daily.   XARELTO 20 MG Tabs tablet Generic drug:  rivaroxaban Take 20 mg by mouth daily.       Allergies: No Known Allergies  Family History: Family History  Problem Relation Age of Onset  . Hypertension Father   . Heart attack Father   . Stroke Sister   . Hypertension Brother   . Stroke Maternal Uncle     Social History:  reports that he has never smoked. He has never used smokeless tobacco. He reports that he does not drink alcohol or use drugs.    Review of Systems  Gastrointestinal (upper)  : Negative for upper GI symptoms  Gastrointestinal (lower) : Negative for lower GI symptoms  Constitutional : Negative for symptoms  Skin: Negative for skin  symptoms  Eyes: Negative for eye symptoms  Ear/Nose/Throat : Negative for Ear/Nose/Throat symptoms  Hematologic/Lymphatic: Negative for Hematologic/Lymphatic symptoms  Cardiovascular : Negative for cardiovascular symptoms  Respiratory : Negative for respiratory symptoms  Endocrine: Negative for endocrine symptoms  Musculoskeletal: Negative for musculoskeletal symptoms  Neurological: Negative for neurological symptoms  Psychologic: Negative for psychiatric symptoms     Physical Exam: There were no vitals taken for this visit.  Constitutional:  Alert and oriented, No acute distress. Uses Cane. Appears older than stated age.  HEENT: [place] AT, moist mucus membranes.  Trachea midline, no masses. Cardiovascular: No clubbing, cyanosis, or edema. Respiratory: Normal respiratory effort, no increased work of breathing. GI: Abdomen is soft, nontender, nondistended, no abdominal masses GU: No CVA tenderness. DRE 60gm smooth.  Skin: No rashes, bruises or suspicious lesions. Lymph: No cervical or inguinal adenopathy. Neurologic: Grossly intact, no focal deficits, moving all 4 extremities. Psychiatric: Normal mood and affect.  Laboratory Data: Lab Results  Component Value Date   WBC 4.1 01/28/2016   HGB 11.2 (L) 01/28/2016   HCT 34.5 (L) 01/28/2016   MCV 94.0 01/28/2016   PLT 136 (L) 01/28/2016    Lab Results  Component Value Date   CREATININE 1.09 01/28/2016      Cystoscopy Procedure Note  Patient identification was confirmed, informed consent was obtained, and patient was prepped using Betadine solution.  Lidocaine jelly was administered per urethral meatus.    Preoperative abx where received prior to procedure.     Pre-Procedure: - Inspection reveals a normal caliber ureteral meatus.  Procedure: The flexible cystoscope was introduced without difficulty - Dense bulbar stiricture noted, appears short segment, estimate 68F.    Post-Procedure: - Patient  tolerated the procedure well    Pertinent Imaging: As per above  Assessment & Plan:    1 - Metastatic Renal Cell Carcinoma - he has good understanding in incurable nature of disease. No role for renal surgery as widespread metastatic disease. Agree with second line therapy per cancer center.    2 - Prostatic Hypertrophy - unclear if remains obstructing as could not visualize proximal to bulbar urethra by cysto today.   3 - Urethral Stricture - recurent bulbar SX. Rec operative cysto / dilation, hold Xarelto 5-7 days prior as he has done before. UCX today. Bactrim x 5 days now and to start 3 days prior.   Risks, benefits, alternatives, peri-op course (day surgery, home with foley, trial of void in few days in office).  4 - Incomplete Bladder Emptying - unclear etiology. May be obstructive or neurogenic or both. Fortunatley GFR preserved at this point, but could certainly become compromised if this persists long term.  Proceed with stricture dilation.      No Follow-up on file.  Alexis Frock, [facility] [address] [place], [place] [address] [phone]

## 2016-02-02 ENCOUNTER — Telehealth: Payer: Self-pay | Admitting: Radiology

## 2016-02-02 ENCOUNTER — Inpatient Hospital Stay: Admission: RE | Admit: 2016-02-02 | Payer: 59 | Source: Ambulatory Visit

## 2016-02-02 NOTE — Telephone Encounter (Signed)
Notified pt of surgery scheduled 02/15/16 with Dr Erlene Quan, pre-admit testing appt on 02/04/16 '@8'$ :00 & to call day prior to surgery for arrival time to SDS. Pt voices understanding.

## 2016-02-03 ENCOUNTER — Telehealth: Payer: Self-pay | Admitting: *Deleted

## 2016-02-03 ENCOUNTER — Encounter: Payer: Self-pay | Admitting: Pain Medicine

## 2016-02-03 ENCOUNTER — Ambulatory Visit: Payer: 59 | Attending: Pain Medicine | Admitting: Pain Medicine

## 2016-02-03 ENCOUNTER — Other Ambulatory Visit: Payer: Self-pay | Admitting: Hematology and Oncology

## 2016-02-03 VITALS — BP 112/66 | HR 65 | Temp 98.0°F | Resp 18 | Ht 75.0 in | Wt 250.0 lb

## 2016-02-03 DIAGNOSIS — Z9889 Other specified postprocedural states: Secondary | ICD-10-CM | POA: Diagnosis not present

## 2016-02-03 DIAGNOSIS — F329 Major depressive disorder, single episode, unspecified: Secondary | ICD-10-CM | POA: Insufficient documentation

## 2016-02-03 DIAGNOSIS — Z86718 Personal history of other venous thrombosis and embolism: Secondary | ICD-10-CM | POA: Diagnosis not present

## 2016-02-03 DIAGNOSIS — Z87440 Personal history of urinary (tract) infections: Secondary | ICD-10-CM | POA: Diagnosis not present

## 2016-02-03 DIAGNOSIS — M1990 Unspecified osteoarthritis, unspecified site: Secondary | ICD-10-CM | POA: Diagnosis not present

## 2016-02-03 DIAGNOSIS — Z8781 Personal history of (healed) traumatic fracture: Secondary | ICD-10-CM | POA: Insufficient documentation

## 2016-02-03 DIAGNOSIS — C799 Secondary malignant neoplasm of unspecified site: Secondary | ICD-10-CM | POA: Insufficient documentation

## 2016-02-03 DIAGNOSIS — I10 Essential (primary) hypertension: Secondary | ICD-10-CM | POA: Insufficient documentation

## 2016-02-03 DIAGNOSIS — M792 Neuralgia and neuritis, unspecified: Secondary | ICD-10-CM | POA: Insufficient documentation

## 2016-02-03 DIAGNOSIS — I252 Old myocardial infarction: Secondary | ICD-10-CM | POA: Insufficient documentation

## 2016-02-03 DIAGNOSIS — R600 Localized edema: Secondary | ICD-10-CM | POA: Diagnosis not present

## 2016-02-03 DIAGNOSIS — S8291XA Unspecified fracture of right lower leg, initial encounter for closed fracture: Secondary | ICD-10-CM

## 2016-02-03 DIAGNOSIS — M79606 Pain in leg, unspecified: Secondary | ICD-10-CM | POA: Diagnosis present

## 2016-02-03 DIAGNOSIS — K219 Gastro-esophageal reflux disease without esophagitis: Secondary | ICD-10-CM | POA: Insufficient documentation

## 2016-02-03 DIAGNOSIS — C649 Malignant neoplasm of unspecified kidney, except renal pelvis: Secondary | ICD-10-CM | POA: Diagnosis not present

## 2016-02-03 DIAGNOSIS — O2230 Deep phlebothrombosis in pregnancy, unspecified trimester: Secondary | ICD-10-CM

## 2016-02-03 DIAGNOSIS — M545 Low back pain: Secondary | ICD-10-CM | POA: Diagnosis present

## 2016-02-03 DIAGNOSIS — I82532 Chronic embolism and thrombosis of left popliteal vein: Secondary | ICD-10-CM

## 2016-02-03 LAB — URINE CULTURE

## 2016-02-03 NOTE — Telephone Encounter (Signed)
Called pt and told him we got clearance request from dr Erlene Quan office and he needs to be off xarelto for 5-7 days prior to proc. 8/29. She wants pt  To have a u/s and if no DVT he may be able to come off xarelto all together.  He is agreeable and colette will try to get him in fo [place]. She can get him in tom but he can't do tom. Colette to call him with new date

## 2016-02-03 NOTE — Patient Instructions (Addendum)
PLAN  Continue present medications. We will consider prescribing medications was we review UDS an additional information as we discussed today. At the present time we are unable to commit to prescribing medications for you  F/U PCP Dr Richarda Overlie for evaluation of  BP and general medical  condition  F/U surgical evaluation. May consider pending follow-up evaluations  F/U neurological evaluation. We will avoid such studies as PNCV EMG studies and other studies  F/U oncology as planned  May consider radiofrequency rhizolysis or intraspinal procedures pending response to present treatment and F/U evaluation . We will avoid interventional treatment  Patient to call [facility] should patient have concerns prior to scheduled return appointment.

## 2016-02-03 NOTE — Progress Notes (Signed)
Safety precautions to be maintained throughout the outpatient stay will include: orient to surroundings, keep bed in low position, maintain call bell within reach at all times, provide assistance with transfer out of bed and ambulation.  

## 2016-02-03 NOTE — Progress Notes (Addendum)
The patient is a 60 year old gentleman who comes to pain management at the request of [place] for further evaluation and treatment of pain involving the mid back lower back lower extremity region. The patient is with history of metastatic renal cell carcinoma. On today's visit the patient stated that his pain was interfering with activities of daily living to significant degree with pain occurring the mid back lower back region. The patient is status post fracture of the right lower extremity due to bone metastasis with placement of rods in the right lower extremity. 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 The patient states that he has pain which is aching and annoying nagging throbbing tingling tiring toothache-like uncomfortable sensation that awakens him from sleep and is associated with significant spasms at time. The patient states that the pain is aggravated by bending kneeling lifting motion standings sitting squatting stooping twisting walking working in the surgery made the pain worse. The patient states the pain is increased with resting sleeping lying down and medications. The patient has significant pain and continues to work. Patient works with pavement markings The patient continues anticoagulation medication and we informed patient that we would prefer avoiding interventional treatment and would attempt to control his pain with noninterventional measures. We informed patient that we would need to review additional studies and information prior to deciding if we would be able to prescribe medications for treatment of his condition. The patient was with understanding and agreed to suggested treatment plan.      Cardiovascular: High blood pressure Prior heart attack Status post heart  surgery  Pulmonary: Unremarkable  Neurological: Unremarkable  Psychological: Depression  Gastrointestinal: Gastroesophageal reflux disease  Genitourinary: Renal cell carcinoma  Metastatic Recurrent UTIs  Hematologic: Unremarkable  Endocrine: Unremarkable  Rheumatological: Osteoarthritis  Musculoskeletal: Unremarkable  Other significant: Renal cell carcinoma stage IV     Physical examination  There was tenderness to palpation of the splenius capitis and occipitalis region of mild degree with mild tenderness of the acromioclavicular and glenohumeral joint region. Patient was with mild to moderate difficulty performing the drop test and appeared to be with bilaterally equal grip strength without increased pain with Tinel and Phalen's maneuver. Palpation over the thoracic region was attends to palpation with well-healed surgical scar of the right thoracic region. The patient is status post resection of the eighth rib due to metastatic carcinoma. There was tenderness over the region of the thoracic region of moderate degree in the lower thoracic region with tenderness over the lumbar paraspinal must reason lumbar facet region a moderate to moderately severe degree with lateral bending rotation extension and palpation of the lumbar facets reproducing moderate discomfort. There was difficulty with movement of the right lower extremity. The patient is status post fracture of the left lower extremity with reduction internal fixation of the right lower extremity with evidence of edema of the left lower extremity. EHL strength of the left lower extremity decreased significantly there was negative Homans of the left lower extremity palpation of the PSIS and PII S region was with moderate discomfort with mild tenderness of the greater trochanteric region iliotibial band region. The right lower extremity was a decreased straight leg raising with tenderness to palpation of the right knee with  negative clonus negative Homans. EHL strength was decreased. 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 We will consider prescribing medications once we review UDS and additional information as we discussed today. At the present time we are unable to commit to prescribing medications for patient  F/U PCP Dr Richarda Overlie for evaluation of  BP and general medical  condition  F/U surgical evaluation. May consider pending follow-up evaluations  F/U neurological evaluation. We will avoid such studies as PNCV EMG studies and other studies  F/U oncology as planned  May consider radiofrequency rhizolysis or intraspinal procedures pending response to present treatment and F/U evaluation . We will avoid interventional treatment  Patient to call [facility] should patient have concerns prior to scheduled return appointment.

## 2016-02-04 ENCOUNTER — Ambulatory Visit: Payer: 59

## 2016-02-04 ENCOUNTER — Encounter
Admission: RE | Admit: 2016-02-04 | Discharge: 2016-02-04 | Disposition: A | Discharge status: Home / Self Care | Payer: 59 | Source: Ambulatory Visit | Attending: Urology | Admitting: Urology

## 2016-02-04 DIAGNOSIS — R600 Localized edema: Secondary | ICD-10-CM | POA: Diagnosis not present

## 2016-02-04 DIAGNOSIS — I82532 Chronic embolism and thrombosis of left popliteal vein: Secondary | ICD-10-CM | POA: Diagnosis present

## 2016-02-04 LAB — SURGICAL PCR SCREEN
MRSA, PCR: NEGATIVE
STAPHYLOCOCCUS AUREUS: NEGATIVE

## 2016-02-04 NOTE — Patient Instructions (Signed)
  Your procedure is scheduled on: Tues 02/15/16 Report to Day Surgery. To find out your arrival time please call [phone] between 1PM - 3PM on Mon. 02/14/16  Remember: Instructions that are not followed completely may result in serious medical risk, up to and including death, or upon the discretion of your surgeon and anesthesiologist your surgery may need to be rescheduled.    _x___ 1. Do not eat food or drink liquids after midnight. No gum chewing or hard candies.     _x___ 2. No Alcohol for 24 hours before or after surgery.   ____ 3. Do Not Smoke For 24 Hours Prior to Your Surgery.   ____ 4. Bring all medications with you on the day of surgery if instructed.    __x__ 5. Notify your doctor if there is any change in your medical condition     (cold, fever, infections).       Do not wear jewelry, make-up, hairpins, clips or nail polish.  Do not wear lotions, powders, or perfumes. You may wear deodorant.  Do not shave 48 hours prior to surgery. Men may shave face and neck.  Do not bring valuables to the hospital.    [facility] is not responsible for any belongings or valuables.               Contacts, dentures or bridgework may not be worn into surgery.  Leave your suitcase in the car. After surgery it may be brought to your room.  For patients admitted to the hospital, discharge time is determined by your                treatment team.   Patients discharged the day of surgery will not be allowed to drive home.   Please read over the following fact sheets that you were given:   MRSA Information and Surgical Site Infection Prevention   ____ Take these medicines the morning of surgery with A SIP OF WATER:    1. amLODipine (NORVASC) 2.5 MG tablet  2. busPIRone (BUSPAR) 5 MG tablet  3. carvedilol (COREG) 12.5 MG tablet  4.gabapentin (NEURONTIN) 300 MG capsule  5.lisinopril (PRINIVIL,ZESTRIL) 20 MG tablet  6.morphine (MS CONTIN) 15 MG 12 hr tablet             7.  ____ Fleet  Enema (as directed)   ____ Use CHG Soap as directed  ____ Use inhalers on the day of surgery  ____ Stop metformin 2 days prior to surgery    ____ Take 1/2 of usual insulin dose the night before surgery and none on the morning of surgery.   _x___ Stop Xarelto as per Oncologist recommendation r/t [place] of lower legs  __x__ Stop Anti-inflammatories on 8/22   ____ Stop supplements until after surgery.    ____ Bring C-Pap to the hospital.

## 2016-02-07 ENCOUNTER — Ambulatory Visit
Admission: RE | Admit: 2016-02-07 | Discharge: 2016-02-07 | Disposition: A | Discharge status: Home / Self Care | Payer: 59 | Source: Ambulatory Visit | Attending: Hematology and Oncology | Admitting: Hematology and Oncology

## 2016-02-07 DIAGNOSIS — I82532 Chronic embolism and thrombosis of left popliteal vein: Secondary | ICD-10-CM | POA: Insufficient documentation

## 2016-02-07 DIAGNOSIS — R600 Localized edema: Secondary | ICD-10-CM | POA: Insufficient documentation

## 2016-02-08 ENCOUNTER — Telehealth: Payer: Self-pay | Admitting: *Deleted

## 2016-02-08 NOTE — Telephone Encounter (Signed)
Called pt to let him know that u/s was neg. For a blood clot and he can come off xarelto only long enough to get procedure done and then he will have to go back on it after surgery.  Called Dr. Erlene Quan office and spoke to Amy and the doctor wanted pt to be off 7 days and so I called the pt and let him know stop drug today but after his procedure to please check with md about when he can start back.  He will ask Erlene Quan after procedure.  I have also faxed the clearance letter to [facility] office and requested that they let pt know when he can start back on xarelto after procedure.

## 2016-02-11 ENCOUNTER — Inpatient Hospital Stay (HOSPITAL_BASED_OUTPATIENT_CLINIC_OR_DEPARTMENT_OTHER): Payer: 59 | Admitting: Hematology and Oncology

## 2016-02-11 ENCOUNTER — Inpatient Hospital Stay: Payer: 59

## 2016-02-11 ENCOUNTER — Other Ambulatory Visit: Payer: Self-pay | Admitting: *Deleted

## 2016-02-11 ENCOUNTER — Other Ambulatory Visit: Payer: Self-pay | Admitting: Hematology and Oncology

## 2016-02-11 VITALS — BP 117/70 | HR 75 | Temp 96.4°F | Resp 18 | Wt 266.3 lb

## 2016-02-11 DIAGNOSIS — R202 Paresthesia of skin: Secondary | ICD-10-CM

## 2016-02-11 DIAGNOSIS — Z7901 Long term (current) use of anticoagulants: Secondary | ICD-10-CM

## 2016-02-11 DIAGNOSIS — C7951 Secondary malignant neoplasm of bone: Secondary | ICD-10-CM

## 2016-02-11 DIAGNOSIS — C642 Malignant neoplasm of left kidney, except renal pelvis: Secondary | ICD-10-CM | POA: Diagnosis not present

## 2016-02-11 DIAGNOSIS — R0602 Shortness of breath: Secondary | ICD-10-CM

## 2016-02-11 DIAGNOSIS — Z85828 Personal history of other malignant neoplasm of skin: Secondary | ICD-10-CM

## 2016-02-11 DIAGNOSIS — Z8701 Personal history of pneumonia (recurrent): Secondary | ICD-10-CM

## 2016-02-11 DIAGNOSIS — M129 Arthropathy, unspecified: Secondary | ICD-10-CM

## 2016-02-11 DIAGNOSIS — L309 Dermatitis, unspecified: Secondary | ICD-10-CM

## 2016-02-11 DIAGNOSIS — I2582 Chronic total occlusion of coronary artery: Secondary | ICD-10-CM

## 2016-02-11 DIAGNOSIS — Z86718 Personal history of other venous thrombosis and embolism: Secondary | ICD-10-CM

## 2016-02-11 DIAGNOSIS — I10 Essential (primary) hypertension: Secondary | ICD-10-CM

## 2016-02-11 DIAGNOSIS — Z79899 Other long term (current) drug therapy: Secondary | ICD-10-CM

## 2016-02-11 DIAGNOSIS — R232 Flushing: Secondary | ICD-10-CM

## 2016-02-11 DIAGNOSIS — N3289 Other specified disorders of bladder: Secondary | ICD-10-CM

## 2016-02-11 DIAGNOSIS — K219 Gastro-esophageal reflux disease without esophagitis: Secondary | ICD-10-CM

## 2016-02-11 DIAGNOSIS — C7800 Secondary malignant neoplasm of unspecified lung: Secondary | ICD-10-CM | POA: Diagnosis not present

## 2016-02-11 DIAGNOSIS — F419 Anxiety disorder, unspecified: Secondary | ICD-10-CM

## 2016-02-11 DIAGNOSIS — F329 Major depressive disorder, single episode, unspecified: Secondary | ICD-10-CM

## 2016-02-11 DIAGNOSIS — E785 Hyperlipidemia, unspecified: Secondary | ICD-10-CM

## 2016-02-11 LAB — MAGNESIUM: Magnesium: 2.1 mg/dL (ref 1.7–2.4)

## 2016-02-11 LAB — CBC WITH DIFFERENTIAL/PLATELET
Basophils Absolute: 0 10*3/uL (ref 0–0.1)
Basophils Relative: 0 %
Eosinophils Absolute: 0.2 10*3/uL (ref 0–0.7)
Eosinophils Relative: 3 %
HCT: 30.2 % — ABNORMAL LOW (ref 40.0–52.0)
Hemoglobin: 10.1 g/dL — ABNORMAL LOW (ref 13.0–18.0)
Lymphocytes Relative: 9 %
Lymphs Abs: 0.6 10*3/uL — ABNORMAL LOW (ref 1.0–3.6)
MCH: 31.6 pg (ref 26.0–34.0)
MCHC: 33.3 g/dL (ref 32.0–36.0)
MCV: 94.8 fL (ref 80.0–100.0)
Monocytes Absolute: 1 10*3/uL (ref 0.2–1.0)
Monocytes Relative: 16 %
Neutro Abs: 4.7 10*3/uL (ref 1.4–6.5)
Neutrophils Relative %: 72 %
Platelets: 159 10*3/uL (ref 150–440)
RBC: 3.18 MIL/uL — ABNORMAL LOW (ref 4.40–5.90)
RDW: 18.7 % — ABNORMAL HIGH (ref 11.5–14.5)
WBC: 6.6 10*3/uL (ref 3.8–10.6)

## 2016-02-11 LAB — COMPREHENSIVE METABOLIC PANEL
ALT: 12 U/L — ABNORMAL LOW (ref 17–63)
AST: 15 U/L (ref 15–41)
Albumin: 3.6 g/dL (ref 3.5–5.0)
Alkaline Phosphatase: 71 U/L (ref 38–126)
Anion gap: 5 (ref 5–15)
BUN: 24 mg/dL — ABNORMAL HIGH (ref 6–20)
CO2: 27 mmol/L (ref 22–32)
Calcium: 8.6 mg/dL — ABNORMAL LOW (ref 8.9–10.3)
Chloride: 104 mmol/L (ref 101–111)
Creatinine, Ser: 1.28 mg/dL — ABNORMAL HIGH (ref 0.61–1.24)
GFR calc Af Amer: >60 mL/min (ref >60)
GFR calc non Af Amer: 59 mL/min — ABNORMAL LOW (ref >60)
Glucose, Bld: 113 mg/dL — ABNORMAL HIGH (ref 65–99)
Potassium: 5 mmol/L (ref 3.5–5.1)
Sodium: 136 mmol/L (ref 135–145)
Total Bilirubin: 0.5 mg/dL (ref 0.3–1.2)
Total Protein: 6.5 g/dL (ref 6.5–8.1)

## 2016-02-11 MED ORDER — SODIUM CHLORIDE 0.9 % IV SOLN
240.0000 mg | Freq: Once | INTRAVENOUS | Status: AC
  Administered 2016-02-11: 240 mg via INTRAVENOUS
  Filled 2016-02-11: qty 20

## 2016-02-11 MED ORDER — SODIUM CHLORIDE 0.9 % IV SOLN
Freq: Once | INTRAVENOUS | Status: AC
  Administered 2016-02-11: 09:00:00 via INTRAVENOUS
  Filled 2016-02-11: qty 1000

## 2016-02-11 NOTE — Progress Notes (Signed)
Patient is here today for follow up, no complaints today

## 2016-02-11 NOTE — Progress Notes (Signed)
[facility] day:  02/11/16   Chief Complaint: Marc Blake is a 60 y.o. male with metastatic renal cell carcinoma who is seen for assessment prior to cycle #2 nivolumab.  HPI: The patient was last seen in the medical oncology clinic on 01/28/2016.  At that time, he received cycle #1 nivolumab.  He tolerated his treatment well. He denied any side effects.  Left lower extremity ultrasound on 02/07/2016 revealed no evidence of DVT.  During the interim, he is felt about the same. He has some chronic fatigue. He denies any shortness of breath, cough or pleuritic pain. As any change in lower extremity chronic edema. He has been off his Xarelto anticipation of upcoming urologic procedure.   Past Medical History:  Diagnosis Date  . Acid reflux   . Anxiety   . Arrhythmia   . Arthritis   . Depression   . Depression   . Hyperlipidemia   . Hypertension   . Myocardial infarction ([place])   . Renal cancer ([place])   . Renal cell carcinoma ([place])   . Skin cancer     Past Surgical History:  Procedure Laterality Date  . BACK SURGERY    . CARDIAC CATHETERIZATION     with stent  . KNEE SURGERY Left   . LEG SURGERY Left   . TONSILLECTOMY      Family History  Problem Relation Age of Onset  . Hypertension Father   . Heart attack Father   . Stroke Sister   . Hypertension Brother   . Stroke Maternal Uncle     Social History:  reports that he has never smoked. He has never used smokeless tobacco. He reports that he does not drink alcohol or use drugs.  He is originally from [place].  He then lived in [place] for 17 years.  He moved to [place] 2 months ago with his wife.  The patient is alone today.  Allergies: No Known Allergies  Current Medications: Current Outpatient Prescriptions  Medication Sig Dispense Refill  . amLODipine (NORVASC) 2.5 MG tablet TAKE 1 TABLET ONCE A DAY FOR 30 DAYS  2  . atorvastatin (LIPITOR) 20 MG tablet TAKE 1  TABLET ONCE A DAY (AT BEDTIME) FOR 30 DAYS  2  . busPIRone (BUSPAR) 5 MG tablet TAKE 1 TABLET 3 TIMES A DAY FOR 30 DAYS  2  . carvedilol (COREG) 12.5 MG tablet TAKE 1 TABLET BY MOUTH TWICE A DAY FOR 30 DAYS  3  . gabapentin (NEURONTIN) 300 MG capsule Take 300 mg by mouth 3 (three) times daily.     Marland Kitchen ibuprofen (ADVIL,MOTRIN) 200 MG tablet Take 200 mg by mouth every 8 (eight) hours as needed for moderate pain. 4 every 8 hours    . lisinopril (PRINIVIL,ZESTRIL) 20 MG tablet TAKE 1 TABLET ONCE A DAY FOR 30 DAYS  3  . morphine (MS CONTIN) 15 MG 12 hr tablet Take 15 mg by mouth every 12 (twelve) hours.  0  . nitrofurantoin (MACRODANTIN) 50 MG capsule TAKE 1 CAPSULE BY MOUTH AT BEDTIME FOR 30 DAYS  3  . Nivolumab (OPDIVO IV) Inject into the vein.    Marland Kitchen oxyCODONE (OXY IR/ROXICODONE) 5 MG immediate release tablet Take 5 mg by mouth every 8 (eight) hours as needed (patient takes 2 tabs in the evening only).     Marland Kitchen senna (SENOKOT) 8.6 MG tablet Take by mouth.    . sertraline (ZOLOFT) 100 MG tablet Take 100 mg by  mouth at bedtime.  1  . sulfamethoxazole-trimethoprim (BACTRIM DS,SEPTRA DS) 800-160 MG tablet Take 1 tablet by mouth 2 (two) times daily. X 5 days now. Also begin 3 days before next Urology surgery 16 tablet 0  . tamsulosin (FLOMAX) 0.4 MG CAPS capsule TAKE 2 CAPSULES ONCE A DAY FOR 30 DAY(S)  3  . traZODone (DESYREL) 50 MG tablet Take 25 mg by mouth daily.  3  . XARELTO 20 MG TABS tablet Take 20 mg by mouth daily.  1   No current facility-administered medications for this visit.     Review of Systems:  GENERAL:  Feels good.  No fevers or sweats.  Weight up and down. PERFORMANCE STATUS (ECOG):  1 HEENT:  No visual changes, runny nose, sore throat, mouth sores or tenderness. Lungs: No increased shortness of breath or cough.  No hemoptysis. Cardiac:  No chest pain, palpitations, orthopnea, or PND. GI:  Appetite 75%.  No nausea, vomiting, constipation, melena or hematochezia. GU:  Bladder  emptying issues.  No self catheterization in awhile.  No urgency, frequency, dysuria, or hematuria. Musculoskeletal:  No back pain.  No joint pain.  No muscle tenderness. Extremities:  No change in chronic lower extremity swelling. Skin:  No rashes or skin changes. Neuro:  No headache, numbness or weakness.  Decreased feeling left leg.  Slight balance problem. Endocrine:  No diabetes, thyroid issues, hot flashes or night sweats. Psych:  No mood changes, depression or anxiety. Pain:  No focal pain. Review of systems:  All other systems reviewed and found to be negative.  Physical Exam: Blood pressure 117/70, pulse 75, temperature (!) 96.4 F (35.8 C), temperature source Tympanic, resp. rate 18, weight 266 lb 5.1 oz (120.8 kg). GENERAL:  Well developed, well nourished, gentleman sitting comfortably in the exam room in no acute distress.  He has a cane at his side. MENTAL STATUS:  Alert and oriented to person, place and time. HEAD:  Long gray hair with goatee.  Normocephalic, atraumatic, face symmetric, no Cushingoid features. EYES:  Pupils equal round and reactive to light and accomodation.  No conjunctivitis or scleral icterus. ENT:  Oropharynx clear without lesion.  Poor dentition.  Tongue normal. Mucous membranes moist.  RESPIRATORY:  Clear to auscultation without rales, wheezes or rhonchi. CHEST WALL:  Well healed incision on left posterior chest wall.  CARDIOVASCULAR:  Regular rate and rhythm without murmur, rub or gallop. ABDOMEN:  Soft, non-tender, with active bowel sounds, and no hepatosplenomegaly.  No masses. SKIN:  Tattoos.  Right flank rash, patchy, non vesicular rash (improved).  EXTREMITIES: Chronic dense lower extremity edema (left > right).  No skin discoloration or tenderness.  No palpable cords. LYMPH NODES: No palpable cervical, supraclavicular, axillary or inguinal adenopathy  NEUROLOGICAL:  Stable.  No change in ambulation. PSYCH:  Appropriate.  Marland Kitchen  Appointment on  02/11/2016  Component Date Value Ref Range Status  . Sodium 02/11/2016 136  135 - 145 mmol/L Final  . Potassium 02/11/2016 5.0  3.5 - 5.1 mmol/L Final  . Chloride 02/11/2016 104  101 - 111 mmol/L Final  . CO2 02/11/2016 27  22 - 32 mmol/L Final  . Glucose, Bld 02/11/2016 113* 65 - 99 mg/dL Final  . BUN 02/11/2016 24* 6 - 20 mg/dL Final  . Creatinine, Ser 02/11/2016 1.28* 0.61 - 1.24 mg/dL Final  . Calcium 02/11/2016 8.6* 8.9 - 10.3 mg/dL Final  . Total Protein 02/11/2016 6.5  6.5 - 8.1 g/dL Final  . Albumin 02/11/2016 3.6  3.5 -  5.0 g/dL Final  . AST 02/11/2016 15  15 - 41 U/L Final  . ALT 02/11/2016 12* 17 - 63 U/L Final  . Alkaline Phosphatase 02/11/2016 71  38 - 126 U/L Final  . Total Bilirubin 02/11/2016 0.5  0.3 - 1.2 mg/dL Final  . GFR calc non Af Amer 02/11/2016 59* >60 mL/min Final  . GFR calc Af Amer 02/11/2016 >60  >60 mL/min Final   Comment: (NOTE) The eGFR has been calculated using the CKD EPI equation. This calculation has not been validated in all clinical situations. eGFR's persistently <60 mL/min signify possible Chronic Kidney Disease.   . Anion gap 02/11/2016 5  5 - 15 Final  . Magnesium 02/11/2016 2.1  1.7 - 2.4 mg/dL Final    Assessment:  Marc Blake is a 60 y.o. male with metastatic renal cell carcinoma presenting in 12/2013.  He had a large left renal mass, rib and vertebral body involvement, and multiple pulmonary nodules.  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    He was on Votrient (pazopanib) from 02/13/2014 - 01/21/2016.  He began nivolumab on 01/28/2016.  Abdominal and pelvic CT scan on 01/12/2014 revealed an 8.5 x 10.2 x 9.2 cm irregularly enhancing mass of the left kidney with some internal calcifications. There were enlarged left sided renal hilar lymph nodes. There were multiple pulmonary nodules in both lower lobes (largest 1.1 cm). There was a destructive bone lesion with an irregular 8.6 x 5.1  cm soft tissue mass invasive of the spinal canal narrowing at least 50% and disturbing the pedicle and portions of the left transverse process rib and T8 vertebral body.   Chest CT on 01/15/2014 revealed innumerable pulmonary nodules, a large metastatic lesion at T8 and left eighth rib with invasion into the spinal canal and moderate canal stenosis. There was a left upper pole right kidney mass.  Head MRI was negative.  He received 3000 cGy to T7 - T9 and associated ribs beginning 05/11/2014.  On 06/15/2014, he received 1 dose of nivolumab.  He presented with progressive paralysis from the waist down on 07/06/2014.  He underwent left posterolateral thoracotomy with left chest wall resection with T8 corpectomy and interbody fusion with donor bone on 07/06/2014.  Pathology revealed metastatic renal cell carcinoma. Post-operatively, he developed sepsis secondary to a submandibular abscess dental abscess.  He underwent tracheostomy, I&D of a right neck abscess, tooth extraction, and PEG tube placement.  Zometa has subsequently been on hold.  He developed a pathologic fracture of the left femur.  He is s/p intramedullary nailing on 01/27/2015.  He received radiation to the left leg.  He was admitted on 04/26/2015 with aspiration pneumonia and opioid overdose. He is currently taking MSContin 15 mg BID and oxycodone 5 mg q day.  Chest, abdomen, and pelvic CT scan on 05/31/2015 revealed slight enlargement of multiple pulmonary nodules.  The left renal mass was similar (12.2 x 9.6 cm).  The right kidney lesion was stable (lower pole lesion 2 x 1.7 cm).  Bone scan on 05/31/2015 revealed slight increased activity in the left costal vertebral junction at T7.  Chest, abdomen, and pelvic CT scan on 12/14/2015 revealed an irregular 10.7 cm heterogeneous renal cortical mass in the upper left kidney and a heterogeneous 2.3 cm renal cortical mass in the lateral lower right kidney.  There was mild bilateral  hydroureteronephrosis with bladder distention.  There was mild left para-aortic lymphadenopathy.  There were numerous (  greater than 20) pulmonary metastases.  Largest nodule was 2.1 cm in the RLL. There was a partially visualized mildly expansile lytic lesion in the left proximal femur status post surgical transfixation.  Thre was a small probably loculated pleural effusion at the left eighth rib resection site.  There were no other bone abnormalities.  Bone scan on 12/23/2015 revealed photopenia from prior removal of the left eighth rib. There was increased  uptake in the medial thoracic spine from T7 and T9 is felt to be due to postoperative fusion.  There was increased uptake throughout much of the left femur as well as increased uptake in the soft tissues of much of the left femur which may be due to previous surgery and radiation therapy change.  He developed a left lower extremity DVT (popliteal and soleal vein) on 06/07/2015.  Left lower extremity ultrasound on 02/07/2016 revealed no evidence of DVT.  He is on long term Xarelto.  Patient has a long standing history of an obstructive uropathy and elevated PVR.  He has undergone urethral dilatation on several occasions.  He is s/p cycle #1 nivolumab (01/28/2016).  He tolerated his infusion well.  TSH was 5.946 (0.35-4.50) with a normal free T4 (0.68) on 01/21/2016.  Symptomatically, he denies any new complaints.  He is off his Xarelto for a urologic procedure on 02/15/2016.  Plan: 1.  Labs today:  CBC with diff, CMP, Mg. 2.  Cycle #2 nivolumab. 3.  Discuss interval ultrasound.  No evidence of thrombosis on ultrasound.  Discuss prior clot below the knee on the left after pathologic fracture and immobility.  Discuss signs and symptoms of thrombosis and need for immediate evaluation if any concerns.  Discuss only temporary discontinuation of anticoagulation secondary to malignancy and plan to reinstitute Xarelto after cleared by urology.    4.  RTC  in 2 weeks for MD assessment, labs (CBC with diff, CMP, TSH) and cycle #3 nivolumab.   [name], MD  02/11/2016 , 9:04 AM

## 2016-02-12 ENCOUNTER — Encounter: Payer: Self-pay | Admitting: Hematology and Oncology

## 2016-02-14 ENCOUNTER — Encounter: Payer: Self-pay | Admitting: Emergency Medicine

## 2016-02-14 ENCOUNTER — Encounter: Payer: Self-pay | Admitting: Hematology and Oncology

## 2016-02-14 ENCOUNTER — Emergency Department: Payer: 59

## 2016-02-14 ENCOUNTER — Inpatient Hospital Stay: Payer: 59 | Attending: Hematology and Oncology

## 2016-02-14 ENCOUNTER — Inpatient Hospital Stay
Admission: EM | Admit: 2016-02-14 | Discharge: 2016-02-16 | DRG: 204 | Disposition: A | Discharge status: Home / Self Care | Payer: 59 | Attending: Internal Medicine | Admitting: Internal Medicine

## 2016-02-14 ENCOUNTER — Telehealth: Payer: Self-pay | Admitting: *Deleted

## 2016-02-14 ENCOUNTER — Other Ambulatory Visit: Payer: Self-pay | Admitting: *Deleted

## 2016-02-14 ENCOUNTER — Other Ambulatory Visit: Payer: Self-pay

## 2016-02-14 ENCOUNTER — Inpatient Hospital Stay: Payer: 59 | Admitting: Hematology and Oncology

## 2016-02-14 VITALS — BP 155/71 | HR 80 | Temp 96.2°F | Resp 20 | Wt 270.9 lb

## 2016-02-14 DIAGNOSIS — R232 Flushing: Secondary | ICD-10-CM | POA: Diagnosis not present

## 2016-02-14 DIAGNOSIS — R778 Other specified abnormalities of plasma proteins: Secondary | ICD-10-CM | POA: Diagnosis present

## 2016-02-14 DIAGNOSIS — R202 Paresthesia of skin: Secondary | ICD-10-CM | POA: Insufficient documentation

## 2016-02-14 DIAGNOSIS — Z955 Presence of coronary angioplasty implant and graft: Secondary | ICD-10-CM

## 2016-02-14 DIAGNOSIS — Z85528 Personal history of other malignant neoplasm of kidney: Secondary | ICD-10-CM

## 2016-02-14 DIAGNOSIS — C642 Malignant neoplasm of left kidney, except renal pelvis: Secondary | ICD-10-CM

## 2016-02-14 DIAGNOSIS — M129 Arthropathy, unspecified: Secondary | ICD-10-CM | POA: Insufficient documentation

## 2016-02-14 DIAGNOSIS — I2582 Chronic total occlusion of coronary artery: Secondary | ICD-10-CM | POA: Diagnosis not present

## 2016-02-14 DIAGNOSIS — I214 Non-ST elevation (NSTEMI) myocardial infarction: Secondary | ICD-10-CM

## 2016-02-14 DIAGNOSIS — N3289 Other specified disorders of bladder: Secondary | ICD-10-CM | POA: Insufficient documentation

## 2016-02-14 DIAGNOSIS — L03116 Cellulitis of left lower limb: Secondary | ICD-10-CM | POA: Diagnosis present

## 2016-02-14 DIAGNOSIS — K219 Gastro-esophageal reflux disease without esophagitis: Secondary | ICD-10-CM | POA: Diagnosis not present

## 2016-02-14 DIAGNOSIS — Z86718 Personal history of other venous thrombosis and embolism: Secondary | ICD-10-CM | POA: Insufficient documentation

## 2016-02-14 DIAGNOSIS — Z8701 Personal history of pneumonia (recurrent): Secondary | ICD-10-CM | POA: Diagnosis not present

## 2016-02-14 DIAGNOSIS — Z7901 Long term (current) use of anticoagulants: Secondary | ICD-10-CM | POA: Diagnosis not present

## 2016-02-14 DIAGNOSIS — F419 Anxiety disorder, unspecified: Secondary | ICD-10-CM | POA: Insufficient documentation

## 2016-02-14 DIAGNOSIS — I10 Essential (primary) hypertension: Secondary | ICD-10-CM | POA: Insufficient documentation

## 2016-02-14 DIAGNOSIS — R7989 Other specified abnormal findings of blood chemistry: Secondary | ICD-10-CM | POA: Diagnosis present

## 2016-02-14 DIAGNOSIS — R0602 Shortness of breath: Secondary | ICD-10-CM

## 2016-02-14 DIAGNOSIS — R0609 Other forms of dyspnea: Secondary | ICD-10-CM | POA: Diagnosis not present

## 2016-02-14 DIAGNOSIS — R0600 Dyspnea, unspecified: Secondary | ICD-10-CM

## 2016-02-14 DIAGNOSIS — F329 Major depressive disorder, single episode, unspecified: Secondary | ICD-10-CM | POA: Insufficient documentation

## 2016-02-14 DIAGNOSIS — Z85828 Personal history of other malignant neoplasm of skin: Secondary | ICD-10-CM | POA: Insufficient documentation

## 2016-02-14 DIAGNOSIS — C799 Secondary malignant neoplasm of unspecified site: Secondary | ICD-10-CM | POA: Diagnosis present

## 2016-02-14 DIAGNOSIS — C7800 Secondary malignant neoplasm of unspecified lung: Secondary | ICD-10-CM | POA: Diagnosis present

## 2016-02-14 DIAGNOSIS — R748 Abnormal levels of other serum enzymes: Secondary | ICD-10-CM | POA: Diagnosis present

## 2016-02-14 DIAGNOSIS — Z79899 Other long term (current) drug therapy: Secondary | ICD-10-CM

## 2016-02-14 DIAGNOSIS — I252 Old myocardial infarction: Secondary | ICD-10-CM | POA: Diagnosis not present

## 2016-02-14 DIAGNOSIS — L309 Dermatitis, unspecified: Secondary | ICD-10-CM | POA: Diagnosis not present

## 2016-02-14 DIAGNOSIS — Z9889 Other specified postprocedural states: Secondary | ICD-10-CM | POA: Diagnosis not present

## 2016-02-14 DIAGNOSIS — Z823 Family history of stroke: Secondary | ICD-10-CM | POA: Diagnosis not present

## 2016-02-14 DIAGNOSIS — I2510 Atherosclerotic heart disease of native coronary artery without angina pectoris: Secondary | ICD-10-CM | POA: Diagnosis present

## 2016-02-14 DIAGNOSIS — C7951 Secondary malignant neoplasm of bone: Secondary | ICD-10-CM

## 2016-02-14 DIAGNOSIS — L409 Psoriasis, unspecified: Secondary | ICD-10-CM | POA: Diagnosis present

## 2016-02-14 DIAGNOSIS — M1990 Unspecified osteoarthritis, unspecified site: Secondary | ICD-10-CM | POA: Diagnosis present

## 2016-02-14 DIAGNOSIS — R079 Chest pain, unspecified: Secondary | ICD-10-CM

## 2016-02-14 DIAGNOSIS — Z8249 Family history of ischemic heart disease and other diseases of the circulatory system: Secondary | ICD-10-CM

## 2016-02-14 DIAGNOSIS — E785 Hyperlipidemia, unspecified: Secondary | ICD-10-CM | POA: Diagnosis not present

## 2016-02-14 LAB — BASIC METABOLIC PANEL
Anion gap: 6 (ref 5–15)
BUN: 29 mg/dL — ABNORMAL HIGH (ref 6–20)
CHLORIDE: 105 mmol/L (ref 101–111)
CO2: 26 mmol/L (ref 22–32)
CREATININE: 1.69 mg/dL — AB (ref 0.61–1.24)
Calcium: 8.8 mg/dL — ABNORMAL LOW (ref 8.9–10.3)
GFR calc non Af Amer: 42 mL/min — ABNORMAL LOW (ref >60)
GFR, EST AFRICAN AMERICAN: 49 mL/min — AB (ref >60)
Glucose, Bld: 106 mg/dL — ABNORMAL HIGH (ref 65–99)
POTASSIUM: 4.6 mmol/L (ref 3.5–5.1)
SODIUM: 137 mmol/L (ref 135–145)

## 2016-02-14 LAB — TROPONIN I
TROPONIN I: 0.04 ng/mL — AB (ref <0.03)
TROPONIN I: 0.06 ng/mL — AB (ref <0.03)
Troponin I: 0.09 ng/mL (ref <0.03)

## 2016-02-14 LAB — CBC
HEMATOCRIT: 28.2 % — AB (ref 40.0–52.0)
Hemoglobin: 9.4 g/dL — ABNORMAL LOW (ref 13.0–18.0)
MCH: 31.8 pg (ref 26.0–34.0)
MCHC: 33.5 g/dL (ref 32.0–36.0)
MCV: 95.1 fL (ref 80.0–100.0)
Platelets: 159 10*3/uL (ref 150–440)
RBC: 2.97 MIL/uL — AB (ref 4.40–5.90)
RDW: 18.1 % — ABNORMAL HIGH (ref 11.5–14.5)
WBC: 6.4 10*3/uL (ref 3.8–10.6)

## 2016-02-14 LAB — MAGNESIUM: Magnesium: 2.1 mg/dL (ref 1.7–2.4)

## 2016-02-14 LAB — APTT: APTT: 34 s (ref 24–36)

## 2016-02-14 LAB — TOXASSURE SELECT 13 (MW), URINE: PDF: 0

## 2016-02-14 LAB — PROTIME-INR
INR: 0.96
PROTHROMBIN TIME: 12.8 s (ref 11.4–15.2)

## 2016-02-14 MED ORDER — HEPARIN BOLUS VIA INFUSION
4000.0000 [IU] | Freq: Once | INTRAVENOUS | Status: AC
  Administered 2016-02-14: 4000 [IU] via INTRAVENOUS
  Filled 2016-02-14: qty 4000

## 2016-02-14 MED ORDER — ASPIRIN 81 MG PO CHEW
324.0000 mg | CHEWABLE_TABLET | Freq: Once | ORAL | Status: AC
  Administered 2016-02-14: 324 mg via ORAL
  Filled 2016-02-14: qty 4

## 2016-02-14 MED ORDER — HEPARIN (PORCINE) IN NACL 100-0.45 UNIT/ML-% IJ SOLN
1600.0000 [IU]/h | INTRAMUSCULAR | Status: DC
  Administered 2016-02-14: 1400 [IU]/h via INTRAVENOUS
  Administered 2016-02-15: 1600 [IU]/h via INTRAVENOUS
  Filled 2016-02-14 (×3): qty 250

## 2016-02-14 NOTE — ED Notes (Signed)
IV attempted left AC without success

## 2016-02-14 NOTE — H&P (Addendum)
[facility] NAME: Marc Blake    MR#:  [id number]  DATE OF BIRTH:  1955-11-20  DATE OF ADMISSION:  02/14/2016  PRIMARY CARE PHYSICIAN: [name], MD   REQUESTING/REFERRING PHYSICIAN: Kerman Passey, MD  CHIEF COMPLAINT:   Chief Complaint  Patient presents with  . Shortness of Breath    HISTORY OF PRESENT ILLNESS:  Marc Blake  is a 60 y.o. male who presents with Significant exertion. Patient has a known history of CAD with a prior stent placed, and history of MI in the past. He was taken off his Xarelto over the last 4-5 days in preparation for a urological procedure. Over that timeframe he has become progressively more short of breath, worse with exertion and improved some by rest. He also had some slight episode of chest tingling that radiated down both arms. He states this felt just like his symptoms when he had his prior MI. Here in the ED he was found to have an elevated troponin, which continue to rise slightly with each subsequent check. Patient was started on heparin and Hospitalists were called for admission  PAST MEDICAL HISTORY:   Past Medical History:  Diagnosis Date  . Acid reflux   . Anxiety   . Arrhythmia   . Arthritis   . Depression   . Depression   . Hyperlipidemia   . Hypertension   . Myocardial infarction ([place])   . Renal cancer ([place])   . Renal cell carcinoma ([place])   . Skin cancer     PAST SURGICAL HISTORY:   Past Surgical History:  Procedure Laterality Date  . BACK SURGERY    . CARDIAC CATHETERIZATION     with stent  . KNEE SURGERY Left   . LEG SURGERY Left   . TONSILLECTOMY      SOCIAL HISTORY:   Social History  Substance Use Topics  . Smoking status: Never Smoker  . Smokeless tobacco: Never Used  . Alcohol use No     Comment: clean and sober 67month    FAMILY HISTORY:   Family History  Problem Relation Age of Onset  . Hypertension Father   . Heart attack  Father   . Stroke Sister   . Hypertension Brother   . Stroke Maternal Uncle     DRUG ALLERGIES:  No Known Allergies  MEDICATIONS AT HOME:   Prior to Admission medications   Medication Sig Start Date End Date Taking? Authorizing Provider  amLODipine (NORVASC) 2.5 MG tablet TAKE 1 TABLET ONCE A DAY FOR 30 DAYS 11/26/15  Yes Historical Provider, MD  atorvastatin (LIPITOR) 20 MG tablet TAKE 1 TABLET ONCE A DAY (AT BEDTIME) FOR 30 DAYS 11/26/15  Yes Historical Provider, MD  busPIRone (BUSPAR) 5 MG tablet TAKE 1 TABLET 3 TIMES A DAY FOR 30 DAYS 10/18/15  Yes Historical Provider, MD  carvedilol (COREG) 12.5 MG tablet TAKE 1 TABLET BY MOUTH TWICE A DAY FOR 30 DAYS 10/18/15  Yes Historical Provider, MD  gabapentin (NEURONTIN) 300 MG capsule Take 300 mg by mouth 3 (three) times daily.    Yes Historical Provider, MD  ibuprofen (ADVIL,MOTRIN) 200 MG tablet Take 200 mg by mouth every 8 (eight) hours as needed for moderate pain. 4 every 8 hours   Yes Historical Provider, MD  lisinopril (PRINIVIL,ZESTRIL) 20 MG tablet TAKE 1 TABLET ONCE A DAY FOR 30 DAYS 10/18/15  Yes Historical Provider, MD  morphine (MS CONTIN) 15 MG 12 hr tablet Take 15  mg by mouth every 12 (twelve) hours. 11/19/15  Yes Historical Provider, MD  nitrofurantoin (MACRODANTIN) 50 MG capsule TAKE 1 CAPSULE BY MOUTH AT BEDTIME FOR 30 DAYS 11/13/15  Yes Historical Provider, MD  Nivolumab (OPDIVO IV) Inject into the vein.   Yes Historical Provider, MD  oxyCODONE (OXY IR/ROXICODONE) 5 MG immediate release tablet Take 5 mg by mouth every 8 (eight) hours as needed (patient takes 2 tabs in the evening only).  12/20/15  Yes Historical Provider, MD  senna (SENOKOT) 8.6 MG tablet Take 2 tablets by mouth at bedtime.    Yes Historical Provider, MD  sertraline (ZOLOFT) 100 MG tablet Take 100 mg by mouth at bedtime. 11/13/15  Yes Historical Provider, MD  sulfamethoxazole-trimethoprim (BACTRIM DS,SEPTRA DS) 800-160 MG tablet Take 1 tablet by mouth 2 (two) times daily. X  5 days now. Also begin 3 days before next Urology surgery 02/01/16  Yes Alexis Frock, MD  tamsulosin (FLOMAX) 0.4 MG CAPS capsule Take 0.8 mg by mouth at bedtime.   Yes Historical Provider, MD  traZODone (DESYREL) 50 MG tablet Take 50 mg by mouth at bedtime.  11/26/15  Yes Historical Provider, MD  XARELTO 20 MG TABS tablet Take 20 mg by mouth daily. 11/16/15  Yes Historical Provider, MD    REVIEW OF SYSTEMS:  Review of Systems  Constitutional: Negative for chills, fever, malaise/fatigue and weight loss.  HENT: Negative for ear pain, hearing loss and tinnitus.   Eyes: Negative for blurred vision, double vision, pain and redness.  Respiratory: Positive for shortness of breath. Negative for cough and hemoptysis.   Cardiovascular: Negative for chest pain, palpitations, orthopnea and leg swelling.  Gastrointestinal: Negative for abdominal pain, constipation, diarrhea, nausea and vomiting.  Genitourinary: Negative for dysuria, frequency and hematuria.  Musculoskeletal: Negative for back pain, joint pain and neck pain.  Skin:       No acne, rash, or lesions  Neurological: Negative for dizziness, tremors, focal weakness and weakness.  Endo/Heme/Allergies: Negative for polydipsia. Does not bruise/bleed easily.  Psychiatric/Behavioral: Negative for depression. The patient is not nervous/anxious and does not have insomnia.      VITAL SIGNS:   Vitals:   02/14/16 2000 02/14/16 2030 02/14/16 2100 02/14/16 2130  BP: 109/70 (!) 109/56 127/72 113/72  Pulse: (!) 55 (!) 54 (!) 59 (!) 145  Resp: (!) '21 19 15 15  '$ Temp:      TempSrc:      SpO2: 100% (!) 87% 97% 96%  Weight:      Height:       Wt Readings from Last 3 Encounters:  02/14/16 122.5 kg (270 lb)  02/14/16 122.9 kg (270 lb 15.1 oz)  02/11/16 120.8 kg (266 lb 5.1 oz)    PHYSICAL EXAMINATION:  Physical Exam  Vitals reviewed. Constitutional: He is oriented to person, place, and time. He appears well-developed and well-nourished. No  distress.  HENT:  Head: Normocephalic and atraumatic.  Mouth/Throat: Oropharynx is clear and moist.  Eyes: Conjunctivae and EOM are normal. Pupils are equal, round, and reactive to light. No scleral icterus.  Neck: Normal range of motion. Neck supple. No JVD present. No thyromegaly present.  Cardiovascular: Normal rate, regular rhythm and intact distal pulses.  Exam reveals no gallop and no friction rub.   No murmur heard. Respiratory: Effort normal and breath sounds normal. No respiratory distress. He has no wheezes. He has no rales.  GI: Soft. Bowel sounds are normal. He exhibits no distension. There is no tenderness.  Musculoskeletal: Normal range  of motion. He exhibits no edema.  No arthritis, no gout  Lymphadenopathy:    He has no cervical adenopathy.  Neurological: He is alert and oriented to person, place, and time. No cranial nerve deficit.  No dysarthria, no aphasia  Skin: Skin is warm and dry. No rash noted. No erythema.  Psychiatric: He has a normal mood and affect. His behavior is normal. Judgment and thought content normal.    LABORATORY PANEL:   CBC  Recent Labs Lab 02/14/16 1535  WBC 6.4  HGB 9.4*  HCT 28.2*  PLT 159   ------------------------------------------------------------------------------------------------------------------  Chemistries   Recent Labs Lab 02/11/16 0831 02/14/16 1339 02/14/16 1535  NA 136  --  137  K 5.0  --  4.6  CL 104  --  105  CO2 27  --  26  GLUCOSE 113*  --  106*  BUN 24*  --  29*  CREATININE 1.28*  --  1.69*  CALCIUM 8.6*  --  8.8*  MG 2.1 2.1  --   AST 15  --   --   ALT 12*  --   --   ALKPHOS 71  --   --   BILITOT 0.5  --   --    ------------------------------------------------------------------------------------------------------------------  Cardiac Enzymes  Recent Labs Lab 02/14/16 2036  TROPONINI 0.09*    ------------------------------------------------------------------------------------------------------------------  RADIOLOGY:  Dg Chest 2 View  Result Date: 02/14/2016 CLINICAL DATA:  Shortness of breath since this morning. EXAM: CHEST  2 VIEW COMPARISON:  Chest CT 12/14/2015 FINDINGS: The cardiac silhouette, mediastinal and hilar contours are stable. Stable pulmonary nodules. No infiltrates or effusions. The bony thorax is stable. The stable fusion hardware. IMPRESSION: No acute cardiopulmonary findings.  Stable pulmonary nodules. Electronically Signed   By: Marijo Sanes M.D.   On: 02/14/2016 16:35    EKG:   Orders placed or performed during the hospital encounter of 02/14/16  . ED EKG within 10 minutes  . ED EKG within 10 minutes    IMPRESSION AND PLAN:  Principal Problem:   Dyspnea on exertion - patient was taken off Xarelto and since that time has had progressive dyspnea on exertion, as well as an episode of chest and arm tingling she states is similar to prior MI symptoms. Suspect cardiac related causes. See below for workup Active Problems:   CAD (coronary artery disease) - continue home meds, cardiology consult, echocardiogram ordered   Elevated troponin - trend cardiac enzymes tonight, heparin drip started cardiology consult   Essential hypertension - stable, continue home meds  **We will also consult and urology given that the patient was scheduled for urological procedure.  All the records are reviewed and case discussed with ED provider. Management plans discussed with the patient and/or family.  DVT PROPHYLAXIS: Systemic anticoagulation  GI PROPHYLAXIS: None  ADMISSION STATUS: Inpatient  CODE STATUS: Full Code Status History    This patient does not have a recorded code status. Please follow your organizational policy for patients in this situation.      TOTAL TIME TAKING CARE OF THIS PATIENT: 45 minutes.    Dajanique Robley FIELDING 02/14/2016, 11:37  PM  [facility]  Office  [phone]  CC: Primary care physician; [name], MD

## 2016-02-14 NOTE — Progress Notes (Signed)
[facility] day:  02/14/16   Chief Complaint: Marc Blake is a 60 y.o. male with metastatic renal cell carcinoma who is seen for sick call visit.  HPI: The patient was last seen in the medical oncology clinic on 02/11/2016.  At that time, he received cycle #2nivolumab.  Symptomatically, he was doing well.  He had been off his Xarelto since 02/07/2016 for a planned urologic procedure.  Left lower extremity duplex on 02/07/2016 revealed no evidence of thrombosis.  He was counseled to report any increased lower extremity edema or shortness of breath off anticoagulation.  He notes increased shortness of breath with minimal exertion for the past 2 days.  Shortness of breath occurs after walking about 200 feet (new). He has had to stop to rest.  He denies any shortness of breath at rest.  He describes awakening at 1 AM short of breath and "not breathing right".  With exertion, he notes a "hot flash and tingling sensation down his arms" and laterally on his chest.  Symptoms are similar to his presentation with a myocardial infarction (MI) several years ago. He states that the only difference is that the tingling sensation with hot flash and shortness of of breath was persistent with his MI.  He denies any increased lower extremity edema or pleuritic chest pain.  Since his last treatment, he notes that his eczema has flared.  He has some erythema of his lower arms and left leg.  He denies any fever.   Past Medical History:  Diagnosis Date  . Acid reflux   . Anxiety   . Arrhythmia   . Arthritis   . Depression   . Depression   . Hyperlipidemia   . Hypertension   . Myocardial infarction ([place])   . Renal cancer ([place])   . Renal cell carcinoma ([place])   . Skin cancer     Past Surgical History:  Procedure Laterality Date  . BACK SURGERY    . CARDIAC CATHETERIZATION     with stent  . KNEE SURGERY Left   . LEG SURGERY Left   . TONSILLECTOMY       Family History  Problem Relation Age of Onset  . Hypertension Father   . Heart attack Father   . Stroke Sister   . Hypertension Brother   . Stroke Maternal Uncle     Social History:  reports that he has never smoked. He has never used smokeless tobacco. He reports that he does not drink alcohol or use drugs.  He is originally from [place].  He then lived in [place] for 17 years.  He moved to [place] 2 months ago with his wife.  The patient is alone today.  Allergies: No Known Allergies  Current Medications: Current Outpatient Prescriptions  Medication Sig Dispense Refill  . amLODipine (NORVASC) 2.5 MG tablet TAKE 1 TABLET ONCE A DAY FOR 30 DAYS  2  . atorvastatin (LIPITOR) 20 MG tablet TAKE 1 TABLET ONCE A DAY (AT BEDTIME) FOR 30 DAYS  2  . busPIRone (BUSPAR) 5 MG tablet TAKE 1 TABLET 3 TIMES A DAY FOR 30 DAYS  2  . carvedilol (COREG) 12.5 MG tablet TAKE 1 TABLET BY MOUTH TWICE A DAY FOR 30 DAYS  3  . gabapentin (NEURONTIN) 300 MG capsule Take 300 mg by mouth 3 (three) times daily.     Marland Kitchen ibuprofen (ADVIL,MOTRIN) 200 MG tablet Take 200 mg by mouth every 8 (eight) hours as needed  for moderate pain. 4 every 8 hours    . lisinopril (PRINIVIL,ZESTRIL) 20 MG tablet TAKE 1 TABLET ONCE A DAY FOR 30 DAYS  3  . morphine (MS CONTIN) 15 MG 12 hr tablet Take 15 mg by mouth every 12 (twelve) hours.  0  . nitrofurantoin (MACRODANTIN) 50 MG capsule TAKE 1 CAPSULE BY MOUTH AT BEDTIME FOR 30 DAYS  3  . Nivolumab (OPDIVO IV) Inject into the vein.    Marland Kitchen oxyCODONE (OXY IR/ROXICODONE) 5 MG immediate release tablet Take 5 mg by mouth every 8 (eight) hours as needed (patient takes 2 tabs in the evening only).     Marland Kitchen senna (SENOKOT) 8.6 MG tablet Take by mouth.    . sertraline (ZOLOFT) 100 MG tablet Take 100 mg by mouth at bedtime.  1  . sulfamethoxazole-trimethoprim (BACTRIM DS,SEPTRA DS) 800-160 MG tablet Take 1 tablet by mouth 2 (two) times daily. X 5 days now. Also begin 3 days before next Urology  surgery 16 tablet 0  . tamsulosin (FLOMAX) 0.4 MG CAPS capsule TAKE 2 CAPSULES ONCE A DAY FOR 30 DAY(S)  3  . traZODone (DESYREL) 50 MG tablet Take 25 mg by mouth daily.  3  . XARELTO 20 MG TABS tablet Take 20 mg by mouth daily.  1   No current facility-administered medications for this visit.     Review of Systems:  GENERAL:  Feels bad.  No fevers or sweats.  Weight up and down. PERFORMANCE STATUS (ECOG):  1 HEENT:  No visual changes, runny nose, sore throat, mouth sores or tenderness. Lungs:  Shortness of breath with minimal exertion (new).  No cough or wheezing.  Tingly sensation in chest laterally.  No hemoptysis. Cardiac:  No chest pressure or radiation to jaw (denied also when he had MI).  No palpitations, orthopnea, or PND. GI:  No nausea, vomiting, constipation, melena or hematochezia. GU:  Bladder emptying issues.  No urgency, frequency, dysuria, or hematuria. Musculoskeletal:  No back pain.  No joint pain.  No muscle tenderness. Extremities:  Arms numb and tingly with exertion.  No change in chronic lower extremity swelling. Skin:  Worsening eczema.  Patchy erythema. Neuro:  No headache, numbness or weakness.  Decreased feeling left leg.  Slight balance problem. Endocrine:  No diabetes, thyroid issues, hot flashes or night sweats. Psych:  No mood changes, depression or anxiety. Pain:  No focal pain. Review of systems:  All other systems reviewed and found to be negative.  Physical Exam: Blood pressure (!) 155/71, pulse 80, temperature (!) 96.2 F (35.7 C), temperature source Tympanic, resp. rate 20, weight 270 lb 15.1 oz (122.9 kg), SpO2 94 %. GENERAL:  Well developed, well nourished, gentleman sitting comfortably in the exam room in no acute distress.  He has a cane at his side. MENTAL STATUS:  Alert and oriented to person, place and time. HEAD:  Long gray hair with goatee.  Normocephalic, atraumatic, face symmetric, no Cushingoid features. EYES:  Pupils equal round and  reactive to light and accomodation.  No conjunctivitis or scleral icterus. ENT:  Oropharynx clear without lesion.  Poor dentition.  Tongue normal. Mucous membranes moist.  RESPIRATORY:  Clear to auscultation without rales, wheezes or rhonchi. CARDIOVASCULAR:  Regular rate and rhythm without murmur, rub or gallop.  No JVD. ABDOMEN:  Soft, non-tender, with active bowel sounds, and no hepatosplenomegaly.  No masses. SKIN:  Tattoos.  Mild facial erythema.  Upper extremities with erythema below elbows.  Left tibial erythema.  EXTREMITIES: Chronic dense  lower extremity edema (left > right).  No skin discoloration or tenderness.  No palpable cords. LYMPH NODES: No palpable cervical, supraclavicular, axillary or inguinal adenopathy  NEUROLOGICAL:  Stable.  No change in ambulation. PSYCH:  Appropriate.  Marland Kitchen  Appointment on 02/14/2016  Component Date Value Ref Range Status  . Magnesium 02/14/2016 2.1  1.7 - 2.4 mg/dL Final    Assessment:  LAVORIS SPARLING is a 60 y.o. male with metastatic renal cell carcinoma presenting in 12/2013.  He had a large left renal mass, rib and vertebral body involvement, and multiple pulmonary nodules.  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    He was on Votrient (pazopanib) from 02/13/2014 - 01/21/2016.  He began nivolumab on 01/28/2016.  Abdominal and pelvic CT scan on 01/12/2014 revealed an 8.5 x 10.2 x 9.2 cm irregularly enhancing mass of the left kidney with some internal calcifications. There were enlarged left sided renal hilar lymph nodes. There were multiple pulmonary nodules in both lower lobes (largest 1.1 cm). There was a destructive bone lesion with an irregular 8.6 x 5.1 cm soft tissue mass invasive of the spinal canal narrowing at least 50% and disturbing the pedicle and portions of the left transverse process rib and T8 vertebral body.   Chest CT on 01/15/2014 revealed innumerable pulmonary nodules, a large  metastatic lesion at T8 and left eighth rib with invasion into the spinal canal and moderate canal stenosis. There was a left upper pole right kidney mass.  Head MRI was negative.  He received 3000 cGy to T7 - T9 and associated ribs beginning 05/11/2014.  On 06/15/2014, he received 1 dose of nivolumab.  He presented with progressive paralysis from the waist down on 07/06/2014.  He underwent left posterolateral thoracotomy with left chest wall resection with T8 corpectomy and interbody fusion with donor bone on 07/06/2014.  Pathology revealed metastatic renal cell carcinoma. Post-operatively, he developed sepsis secondary to a submandibular abscess dental abscess.  He underwent tracheostomy, I&D of a right neck abscess, tooth extraction, and PEG tube placement.  Zometa has subsequently been on hold.  He developed a pathologic fracture of the left femur.  He is s/p intramedullary nailing on 01/27/2015.  He received radiation to the left leg.  He was admitted on 04/26/2015 with aspiration pneumonia and opioid overdose. He is currently taking MSContin 15 mg BID and oxycodone 5 mg q day.  Chest, abdomen, and pelvic CT scan on 05/31/2015 revealed slight enlargement of multiple pulmonary nodules.  The left renal mass was similar (12.2 x 9.6 cm).  The right kidney lesion was stable (lower pole lesion 2 x 1.7 cm).  Bone scan on 05/31/2015 revealed slight increased activity in the left costal vertebral junction at T7.  Chest, abdomen, and pelvic CT scan on 12/14/2015 revealed an irregular 10.7 cm heterogeneous renal cortical mass in the upper left kidney and a heterogeneous 2.3 cm renal cortical mass in the lateral lower right kidney.  There was mild bilateral hydroureteronephrosis with bladder distention.  There was mild left para-aortic lymphadenopathy.  There were numerous (greater than 20) pulmonary metastases.  Largest nodule was 2.1 cm in the RLL. There was a partially visualized mildly expansile lytic  lesion in the left proximal femur status post surgical transfixation.  Thre was a small probably loculated pleural effusion at the left eighth rib resection site.  There were no other bone abnormalities.  Bone scan on 12/23/2015 revealed photopenia from prior removal of  the left eighth rib. There was increased  uptake in the medial thoracic spine from T7 and T9 is felt to be due to postoperative fusion.  There was increased uptake throughout much of the left femur as well as increased uptake in the soft tissues of much of the left femur which may be due to previous surgery and radiation therapy change.  He developed a left lower extremity DVT (popliteal and soleal vein) on 06/07/2015.  Left lower extremity ultrasound on 02/07/2016 revealed no evidence of DVT.  He is on long term Xarelto.  Patient has a long standing history of an obstructive uropathy and elevated PVR.  He has undergone urethral dilatation on several occasions.  He is s/p 2 cycles of nivolumab (01/28/2016 - 02/11/2016).  He tolerated his infusions well.  TSH was 5.946 (0.35-4.50) with a normal free T4 (0.68) on 01/21/2016.  He has been off his Xarelto for a urologic procedure on 02/15/2016.  He has a 2 day history of increased shortness of breath with exertion associated with a tingly sensation in his chest and arms similar to his presentation with a MI several years ago.  He has a flare of his eczema and patchy skin erythema likely due to nivolumab.  Plan: 1.  Discuss differential diagnosis of current symptoms.  Concern for cardiac ischemia given similar presentation to MI.  Discuss need for EKG and cardiac enzymes.  Discuss possible pulmonary embolism as he has been off Xarelto (prior DVT was below the left knee with negative duplex prior to holding Xarelto).  Nivolumab can cause pneumonitis (<= 5%) and myocarditis (< 1%).  Discuss transfer to ER for evalation and management.  Discussed possibility of admission to the hospital. 2.   Discussed case with ER physician. 3.  Transport patient to ER.   [name], MD  02/14/2016 , 2:16 PM

## 2016-02-14 NOTE — ED Notes (Signed)
Pt presents with sob upon exertion; goes away with rest; also has rash on left arm that started one week ago. Pt states he has hx of kidney cancer x 2 years with mets to lungs and left femur. Pt states that he has tingling in both arms since last week, and that it feels like "when I had my heart attack" and he wants it checked out. States he fatigues easily - not sure if it is r/t new chemo drug that he recently started.

## 2016-02-14 NOTE — ED Provider Notes (Addendum)
[facility] Emergency Department Provider Note  Time seen: 5:15 PM  I have reviewed the triage vital signs and the nursing notes.   HISTORY  Chief Complaint Shortness of Breath    HPI Marc Blake is a 60 y.o. male with a past medical history of anxiety, depression, hyperlipidemia, hypertension, MI, renal cell carcinoma, who presents to the emergency department for shortness of breath with exertion. According to the patient he has a history of shortness of breath with exertion, however over the past 2 days he states he will walk distances as short as 200 feet 4 he has to sit down due to shortness breath. Denies any chest pain with the shortness of breath. Patient was taken off of Xarelto one week ago in anticipation for a prostate surgery tomorrow. Patient states the shortness of breath on exertion concern them as he is off Xarelto so he came into the department for evaluation. Denies any symptoms currently.  Past Medical History:  Diagnosis Date  . Acid reflux   . Anxiety   . Arrhythmia   . Arthritis   . Depression   . Depression   . Hyperlipidemia   . Hypertension   . Myocardial infarction ([place])   . Renal cancer ([place])   . Renal cell carcinoma ([place])   . Skin cancer     Patient Active Problem List   Diagnosis Date Noted  . Metastatic cancer ([place]) 02/03/2016  . DVT (deep vein thrombosis) in pregnancy 02/03/2016  . Fracture of right lower extremity 02/03/2016  . H/O resection of rib 02/03/2016  . Enlarged prostate without urinary obstruction 02/01/2016  . Urethral stricture 02/01/2016  . Personal history of deep vein thrombosis 02/01/2016  . CAD (coronary artery disease) 01/19/2016  . Mixed hyperlipidemia 01/19/2016  . Shortness of breath 01/19/2016  . Hydronephrosis 01/10/2016  . Incomplete bladder emptying 01/10/2016  . Chronic pain due to neoplasm 12/13/2015  . Essential hypertension 12/13/2015  . Major depressive disorder with single  episode, in partial remission ([place]) 12/13/2015  . Deep venous thrombosis of left popliteal vein ([place]) 06/07/2015  . Renal cell carcinoma ([place]) 01/21/2014  . Bone metastasis ([place]) 01/21/2014  . Lung metastases ([place]) 01/21/2014  . 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 BACK SURGERY    . CARDIAC CATHETERIZATION     with stent  . KNEE SURGERY Left   . LEG SURGERY Left   . TONSILLECTOMY      Prior to Admission medications   Medication Sig Start Date End Date Taking? Authorizing Provider  amLODipine (NORVASC) 2.5 MG tablet TAKE 1 TABLET ONCE A DAY FOR 30 DAYS 11/26/15   Historical Provider, MD  atorvastatin (LIPITOR) 20 MG tablet TAKE 1 TABLET ONCE A DAY (AT BEDTIME) FOR 30 DAYS 11/26/15   Historical Provider, MD  busPIRone (BUSPAR) 5 MG tablet TAKE 1 TABLET 3 TIMES A DAY FOR 30 DAYS 10/18/15   Historical Provider, MD  carvedilol (COREG) 12.5 MG tablet TAKE 1 TABLET BY MOUTH TWICE A DAY FOR 30 DAYS 10/18/15   Historical Provider, MD  gabapentin (NEURONTIN) 300 MG capsule Take 300 mg by mouth 3 (three) times daily.     Historical Provider, MD  ibuprofen (ADVIL,MOTRIN) 200 MG tablet Take 200 mg by mouth every 8 (eight) hours as needed for moderate pain. 4 every 8 hours    Historical Provider, MD  lisinopril (PRINIVIL,ZESTRIL) 20 MG tablet TAKE 1 TABLET ONCE A  DAY FOR 30 DAYS 10/18/15   Historical Provider, MD  morphine (MS CONTIN) 15 MG 12 hr tablet Take 15 mg by mouth every 12 (twelve) hours. 11/19/15   Historical Provider, MD  nitrofurantoin (MACRODANTIN) 50 MG capsule TAKE 1 CAPSULE BY MOUTH AT BEDTIME FOR 30 DAYS 11/13/15   Historical Provider, MD  Nivolumab (OPDIVO IV) Inject into the vein.    Historical Provider, MD  oxyCODONE (OXY IR/ROXICODONE) 5 MG immediate release tablet Take 5 mg by mouth every 8 (eight) hours as needed (patient takes 2 tabs in the evening only).  12/20/15   Historical Provider, MD  senna (SENOKOT) 8.6 MG  tablet Take by mouth.    Historical Provider, MD  sertraline (ZOLOFT) 100 MG tablet Take 100 mg by mouth at bedtime. 11/13/15   Historical Provider, MD  sulfamethoxazole-trimethoprim (BACTRIM DS,SEPTRA DS) 800-160 MG tablet Take 1 tablet by mouth 2 (two) times daily. X 5 days now. Also begin 3 days before next Urology surgery 02/01/16   Alexis Frock, MD  tamsulosin (FLOMAX) 0.4 MG CAPS capsule TAKE 2 CAPSULES ONCE A DAY FOR 30 DAY(S) 10/18/15   Historical Provider, MD  traZODone (DESYREL) 50 MG tablet Take 25 mg by mouth daily. 11/26/15   Historical Provider, MD  XARELTO 20 MG TABS tablet Take 20 mg by mouth daily. 11/16/15   Historical Provider, MD    No Known Allergies  Family History  Problem Relation Age of Onset  . Hypertension Father   . Heart attack Father   . Stroke Sister   . Hypertension Brother   . Stroke Maternal Uncle     Social History Social History  Substance Use Topics  . Smoking status: Never Smoker  . Smokeless tobacco: Never Used  . 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 Cardiovascular: Negative for chest pain. Respiratory: Shortness of breath with exertion only. Gastrointestinal: Negative for abdominal pain Musculoskeletal: Negative for back pain. Negative for leg pain. Mild swelling with mild redness of the left leg which the patient states is unchanged grade 1 year. Neurological: Negative for headache 10-point ROS otherwise negative.  ____________________________________________   PHYSICAL EXAM:  VITAL SIGNS: ED Triage Vitals  Enc Vitals Group     BP 02/14/16 1538 113/77     Pulse Rate 02/14/16 1538 70     Resp 02/14/16 1538 16     Temp 02/14/16 1538 97.9 F (36.6 C)     Temp Source 02/14/16 1538 Oral     SpO2 02/14/16 1538 99 %     Weight 02/14/16 1539 270 lb (122.5 kg)     Height 02/14/16 1539 '6\' 3"'$  (1.905 m)     Head Circumference --      Peak Flow --      Pain Score  02/14/16 1539 0     Pain Loc --      Pain Edu? --      Excl. in [place] --     Constitutional: Alert and oriented. Well appearing and in no distress. Eyes: Normal exam ENT   Head: Normocephalic and atraumatic.   Mouth/Throat: Mucous membranes are moist. Cardiovascular: Normal rate, regular rhythm. No murmur Respiratory: Normal respiratory effort without tachypnea nor retractions. Breath sounds are clear  Gastrointestinal: Soft and nontender. No distention.   Musculoskeletal: Nontender with normal range of motion in all extremities. Mild lower extremity edema in the right lower extremity, moderate edema of the left lower  extremity with mild erythema which the patient states is unchanged > one year. Neurologic:  Normal speech and language. No gross focal neurologic deficits  Skin:  Skin is warm, dry and intact.  Psychiatric: Mood and affect are normal.  ____________________________________________    RADIOLOGY  Chest x-ray negative  ____________________________________________   INITIAL IMPRESSION / ASSESSMENT AND PLAN / ED COURSE  Pertinent labs & imaging results that were available during my care of the patient were reviewed by me and considered in my medical decision making (see chart for details).  The patient presents to the emergency department with shortness breath on exertion. The patient states he always get short of breath with exertion over the past few days of this been shorter distances which has been causing his symptoms. Patient denies any discomfort in the chest. Denies any chest pain nausea or diaphoresis at any point. Denies any symptoms currently. Patient's labs show a slightly elevated troponin 0.04. Otherwise normal workup. I discussed the patient with Dr. Saralyn Pilar who is on-call for the patient's cardiologist Dr. Nehemiah Massed. We will plan to perform a second cardiac enzyme, if unchanged we will discharge the patient home with cardiology follow-up tomorrow. Given  the patient's symptoms I believe we should delay the patient surgery until he can be cleared by his cardiologist.  Troponin continues elevated on multiple draws, most consistent with NSTEMI. We will start the patient on a heparin drip. We'll admit the patient to the hospital for further workup. Patient agreeable to plan.  CRITICAL CARE Performed by: Harvest Dark   Total critical care time: 30 minutes  Critical care time was exclusive of separately billable procedures and treating other patients.  Critical care was necessary to treat or prevent imminent or life-threatening deterioration.  Critical care was time spent personally by me on the following activities: development of treatment plan with patient and/or surrogate as well as nursing, discussions with consultants, evaluation of patient's response to treatment, examination of patient, obtaining history from patient or surrogate, ordering and performing treatments and interventions, ordering and review of laboratory studies, ordering and review of radiographic studies, pulse oximetry and re-evaluation of patient's condition.   ____________________________________________   FINAL CLINICAL IMPRESSION(S) / ED DIAGNOSES  Dyspnea on exertion NSTEMI   Harvest Dark, MD 02/14/16 2159    Harvest Dark, MD 02/14/16 2202

## 2016-02-14 NOTE — Telephone Encounter (Signed)
Pt called mid morning c/o of sob on exertion like walking 200 ft and having shoulder pain that goes down from of  Chest.  Checked with corcoran and she wanted to see pt because we did LLE doppler last Monday and it was neg. So he was taken off xarelto to prepare for cystoscopy  On 8/29.  We made pt appt to come in this afternoon to have labs and see md and he is agreeable to this plan.

## 2016-02-14 NOTE — ED Notes (Signed)
Per pharmacy Heparin changed from 1400 units to 1700 units

## 2016-02-14 NOTE — Progress Notes (Addendum)
ANTICOAGULATION CONSULT NOTE - Initial Consult  Pharmacy Consult for Heparin  Indication: chest pain/ACS and PE/DVT  No Known Allergies  Patient Measurements: Height: '6\' 3"'$  (190.5 cm) Weight: 270 lb (122.5 kg) IBW/kg (Calculated) : 84.5 Heparin Dosing Weight: 110.7 kg   Vital Signs: Temp: 97.9 F (36.6 C) (08/28 1538) Temp Source: Oral (08/28 1538) BP: 113/72 (08/28 2130) Pulse Rate: 145 (08/28 2130)  Labs:  Recent Labs  02/14/16 1535 02/14/16 1749 02/14/16 2036  HGB 9.4*  --   --   HCT 28.2*  --   --   PLT 159  --   --   CREATININE 1.69*  --   --   TROPONINI 0.04* 0.06* 0.09*    Estimated Creatinine Clearance: 65.5 mL/min (by C-G formula based on SCr of 1.69 mg/dL).   Medical History: Past Medical History:  Diagnosis Date  . Acid reflux   . Anxiety   . Arrhythmia   . Arthritis   . Depression   . Depression   . Hyperlipidemia   . Hypertension   . Myocardial infarction ([place])   . Renal cancer ([place])   . Renal cell carcinoma ([place])   . Skin cancer     Medications:   (Not in a hospital admission)  Assessment: Pt was on Xarelto at home but had not taken a dose since 8/22.  CrCl = 65.5 ml/min   Goal of Therapy:  Heparin level 0.3-0.7 units/ml Monitor platelets by anticoagulation protocol: Yes   Plan:  Give 4000 units bolus x 1 Start heparin infusion at 1400 units/hr  Will order baseline aptt and INR.  Will draw 1st HL 6 hrs after start of drip.   Robbins,Jason D 02/14/2016,10:13 PM   2328 02/14/16 Spoke to Dr. Kerman Passey - increase rate for PE/DVT coverage. 1700 units/hr, will not give additional bolus. Will retime labs.   Miqueas Whilden A. [place], [place].D., BCPS

## 2016-02-14 NOTE — ED Triage Notes (Addendum)
C/O DOE onset Friday. Also c/o bilateral shoulder and arm pain with exertion.  Symptoms resolve with rest.  Also c/o new onset redness to RLE and LUE.  Patient unsure if redness has to do with new Chemo or new antibiotic patient is taking.

## 2016-02-15 ENCOUNTER — Encounter
Admission: EM | Disposition: A | Discharge status: Home / Self Care | Payer: Self-pay | Source: Home / Self Care | Attending: Internal Medicine

## 2016-02-15 ENCOUNTER — Inpatient Hospital Stay: Payer: 59

## 2016-02-15 ENCOUNTER — Ambulatory Visit: Admission: RE | Admit: 2016-02-15 | Payer: 59 | Source: Ambulatory Visit | Admitting: Urology

## 2016-02-15 ENCOUNTER — Other Ambulatory Visit: Payer: Self-pay | Admitting: Pain Medicine

## 2016-02-15 LAB — NM MYOCAR MULTI W/SPECT W/WALL MOTION / EF
CSEPEDS: 1 s
CSEPHR: 50 %
CSEPPHR: 81 {beats}/min
Estimated workload: 1 METS
Exercise duration (min): 1 min
LVDIAVOL: 163 mL (ref 62–150)
LVSYSVOL: 58 mL
MPHR: 160 {beats}/min
NUC STRESS TID: 0.92
Rest HR: 59 {beats}/min
SDS: 1
SRS: 2
SSS: 2

## 2016-02-15 LAB — CBC
HCT: 26.6 % — ABNORMAL LOW (ref 40.0–52.0)
Hemoglobin: 9 g/dL — ABNORMAL LOW (ref 13.0–18.0)
MCH: 31.5 pg (ref 26.0–34.0)
MCHC: 33.8 g/dL (ref 32.0–36.0)
MCV: 93 fL (ref 80.0–100.0)
PLATELETS: 139 10*3/uL — AB (ref 150–440)
RBC: 2.86 MIL/uL — AB (ref 4.40–5.90)
RDW: 17.8 % — ABNORMAL HIGH (ref 11.5–14.5)
WBC: 4.4 10*3/uL (ref 3.8–10.6)

## 2016-02-15 LAB — BASIC METABOLIC PANEL
ANION GAP: 4 — AB (ref 5–15)
BUN: 27 mg/dL — ABNORMAL HIGH (ref 6–20)
CALCIUM: 8.6 mg/dL — AB (ref 8.9–10.3)
CO2: 28 mmol/L (ref 22–32)
CREATININE: 1.48 mg/dL — AB (ref 0.61–1.24)
Chloride: 107 mmol/L (ref 101–111)
GFR, EST AFRICAN AMERICAN: 58 mL/min — AB (ref >60)
GFR, EST NON AFRICAN AMERICAN: 50 mL/min — AB (ref >60)
GLUCOSE: 108 mg/dL — AB (ref 65–99)
Potassium: 4.5 mmol/L (ref 3.5–5.1)
Sodium: 139 mmol/L (ref 135–145)

## 2016-02-15 LAB — APTT: aPTT: 129 seconds — ABNORMAL HIGH (ref 24–36)

## 2016-02-15 LAB — TROPONIN I
TROPONIN I: 0.09 ng/mL — AB (ref <0.03)
TROPONIN I: 0.08 ng/mL — AB (ref <0.03)
TROPONIN I: 0.07 ng/mL — AB (ref <0.03)

## 2016-02-15 LAB — HEPARIN LEVEL (UNFRACTIONATED)
Heparin Unfractionated: 0.4 IU/mL (ref 0.30–0.70)
Heparin Unfractionated: 0.22 IU/mL — ABNORMAL LOW (ref 0.30–0.70)

## 2016-02-15 SURGERY — CYSTOSCOPY, WITH URETHRAL DILATION
Anesthesia: Choice

## 2016-02-15 MED ORDER — REGADENOSON 0.4 MG/5ML IV SOLN
0.4000 mg | Freq: Once | INTRAVENOUS | Status: AC
Start: 2016-02-15 — End: 2016-02-15
  Administered 2016-02-15: 0.4 mg via INTRAVENOUS

## 2016-02-15 MED ORDER — TRAZODONE HCL 50 MG PO TABS
50.0000 mg | ORAL_TABLET | Freq: Every day | ORAL | Status: DC
  Administered 2016-02-15: 50 mg via ORAL
  Filled 2016-02-15: qty 1

## 2016-02-15 MED ORDER — ATORVASTATIN CALCIUM 20 MG PO TABS
20.0000 mg | ORAL_TABLET | Freq: Every day | ORAL | Status: DC
  Administered 2016-02-15: 20 mg via ORAL
  Filled 2016-02-15: qty 1

## 2016-02-15 MED ORDER — SENNOSIDES-DOCUSATE SODIUM 8.6-50 MG PO TABS
1.0000 | ORAL_TABLET | Freq: Two times a day (BID) | ORAL | Status: DC
  Administered 2016-02-15: 1 via ORAL
  Filled 2016-02-15: qty 1

## 2016-02-15 MED ORDER — SERTRALINE HCL 100 MG PO TABS
100.0000 mg | ORAL_TABLET | Freq: Every day | ORAL | Status: DC
  Administered 2016-02-15 (×2): 100 mg via ORAL
  Filled 2016-02-15 (×2): qty 1

## 2016-02-15 MED ORDER — MORPHINE SULFATE ER 15 MG PO TBCR
15.0000 mg | EXTENDED_RELEASE_TABLET | Freq: Two times a day (BID) | ORAL | Status: DC
  Administered 2016-02-15 (×3): 15 mg via ORAL
  Filled 2016-02-15 (×3): qty 1

## 2016-02-15 MED ORDER — LISINOPRIL 20 MG PO TABS
20.0000 mg | ORAL_TABLET | Freq: Every day | ORAL | Status: DC
  Administered 2016-02-15: 20 mg via ORAL
  Filled 2016-02-15: qty 1

## 2016-02-15 MED ORDER — SODIUM CHLORIDE 0.9 % IV SOLN
3.0000 g | Freq: Four times a day (QID) | INTRAVENOUS | Status: DC
  Filled 2016-02-15 (×3): qty 3

## 2016-02-15 MED ORDER — ACETAMINOPHEN 325 MG PO TABS: 650.0000 mg | ORAL_TABLET | Freq: Four times a day (QID) | ORAL | Status: DC | PRN

## 2016-02-15 MED ORDER — TECHNETIUM TC 99M TETROFOSMIN IV KIT
12.1400 | PACK | Freq: Once | INTRAVENOUS | Status: AC | PRN
  Administered 2016-02-15: 12.14 via INTRAVENOUS

## 2016-02-15 MED ORDER — SENNA 8.6 MG PO TABS: 2.0000 | ORAL_TABLET | Freq: Every day | ORAL | Status: DC

## 2016-02-15 MED ORDER — BUSPIRONE HCL 5 MG PO TABS
5.0000 mg | ORAL_TABLET | Freq: Three times a day (TID) | ORAL | Status: DC
  Administered 2016-02-15 (×3): 5 mg via ORAL
  Filled 2016-02-15 (×5): qty 1

## 2016-02-15 MED ORDER — ONDANSETRON HCL 4 MG/2ML IJ SOLN: 4.0000 mg | Freq: Four times a day (QID) | INTRAMUSCULAR | Status: DC | PRN

## 2016-02-15 MED ORDER — AMLODIPINE BESYLATE 5 MG PO TABS
2.5000 mg | ORAL_TABLET | Freq: Every day | ORAL | Status: DC
  Administered 2016-02-15: 2.5 mg via ORAL
  Filled 2016-02-15: qty 1

## 2016-02-15 MED ORDER — TECHNETIUM TC 99M TETROFOSMIN IV KIT
30.2300 | PACK | Freq: Once | INTRAVENOUS | Status: AC | PRN
  Administered 2016-02-15: 30.23 via INTRAVENOUS

## 2016-02-15 MED ORDER — SODIUM CHLORIDE 0.9 % IV SOLN
1.5000 g | Freq: Four times a day (QID) | INTRAVENOUS | Status: DC
  Administered 2016-02-15 – 2016-02-16 (×4): 1.5 g via INTRAVENOUS
  Filled 2016-02-15 (×7): qty 1.5

## 2016-02-15 MED ORDER — ONDANSETRON HCL 4 MG PO TABS: 4.0000 mg | ORAL_TABLET | Freq: Four times a day (QID) | ORAL | Status: DC | PRN

## 2016-02-15 MED ORDER — SODIUM CHLORIDE 0.9% FLUSH
3.0000 mL | Freq: Two times a day (BID) | INTRAVENOUS | Status: DC
  Administered 2016-02-15 (×2): 3 mL via INTRAVENOUS

## 2016-02-15 MED ORDER — OXYCODONE HCL 5 MG PO TABS
5.0000 mg | ORAL_TABLET | Freq: Three times a day (TID) | ORAL | Status: DC | PRN
  Administered 2016-02-16: 5 mg via ORAL
  Filled 2016-02-15: qty 1

## 2016-02-15 MED ORDER — ACETAMINOPHEN 650 MG RE SUPP: 650.0000 mg | Freq: Four times a day (QID) | RECTAL | Status: DC | PRN

## 2016-02-15 MED ORDER — RIVAROXABAN 20 MG PO TABS
20.0000 mg | ORAL_TABLET | Freq: Every day | ORAL | Status: DC
  Administered 2016-02-15: 20 mg via ORAL
  Filled 2016-02-15: qty 1

## 2016-02-15 MED ORDER — CARVEDILOL 12.5 MG PO TABS
12.5000 mg | ORAL_TABLET | Freq: Two times a day (BID) | ORAL | Status: DC
  Administered 2016-02-15 – 2016-02-16 (×3): 12.5 mg via ORAL
  Filled 2016-02-15 (×3): qty 1

## 2016-02-15 MED ORDER — GABAPENTIN 300 MG PO CAPS
300.0000 mg | ORAL_CAPSULE | Freq: Three times a day (TID) | ORAL | Status: DC
  Administered 2016-02-15 (×3): 300 mg via ORAL
  Filled 2016-02-15 (×3): qty 1

## 2016-02-15 MED ORDER — IOPAMIDOL (ISOVUE-370) INJECTION 76%
75.0000 mL | Freq: Once | INTRAVENOUS | Status: AC | PRN
  Administered 2016-02-15: 75 mL via INTRAVENOUS

## 2016-02-15 MED ORDER — TAMSULOSIN HCL 0.4 MG PO CAPS
0.8000 mg | ORAL_CAPSULE | Freq: Every day | ORAL | Status: DC
  Administered 2016-02-15: 0.8 mg via ORAL
  Filled 2016-02-15 (×2): qty 2

## 2016-02-15 NOTE — Consult Note (Signed)
[facility]  CARDIOLOGY CONSULT NOTE  Patient ID: Marc Blake MRN: [id number] DOB/AGE: [birth year] 60 y.o.  Admit date: 02/14/2016 Referring Physician Marc Blake Primary Physician Marc Blake Primary Cardiologist Marc Blake Reason for Consultation Borderline elevated troponin  HPI: 60 year old gentleman referred for evaluation of borderline elevated troponin. The patient has known coronary disease status post prior coronary stent approximately 10 years ago in [place]. The patient has no history of chronic left lower extremity DVT on Xarelto. Xarelto has been on hold, pending cystoscopy. The patient currently denies chest pain, but does experience exertional dyspnea, after walking approximately 150 feet. ECG was nondiagnostic. Troponin was borderline elevated.  Review of systems complete and found to be negative unless listed above     Past Medical History:  Diagnosis Date  . Acid reflux   . Anxiety   . Arthritis   . Depression   . Depression   . Hyperlipidemia   . Hypertension   . Myocardial infarction (Marc [place])   . Renal cancer ([place])   . Renal cell carcinoma ([place])   . Skin cancer     Past Surgical History:  Procedure Laterality Date  . BACK SURGERY    . CARDIAC CATHETERIZATION     with stent  . KNEE SURGERY Left   . LEG SURGERY Left   . TONSILLECTOMY      Prescriptions Prior to Admission  Medication Sig Dispense Refill Last Dose  . amLODipine (NORVASC) 2.5 MG tablet TAKE 1 TABLET ONCE A DAY FOR 30 DAYS  2 02/14/2016 at Unknown time  . atorvastatin (LIPITOR) 20 MG tablet TAKE 1 TABLET ONCE A DAY (AT BEDTIME) FOR 30 DAYS  2 02/13/2016 at Unknown time  . busPIRone (BUSPAR) 5 MG tablet TAKE 1 TABLET 3 TIMES A DAY FOR 30 DAYS  2 02/14/2016 at Unknown time  . carvedilol (COREG) 12.5 MG tablet TAKE 1 TABLET BY MOUTH TWICE A DAY FOR 30 DAYS  3 02/14/2016 at 0700  . gabapentin (NEURONTIN) 300 MG capsule Take 300 mg by mouth 3 (three) times daily.    02/14/2016 at Unknown  time  . ibuprofen (ADVIL,MOTRIN) 200 MG tablet Take 200 mg by mouth every 8 (eight) hours as needed for moderate pain. 4 every 8 hours   prn at prn  . lisinopril (PRINIVIL,ZESTRIL) 20 MG tablet TAKE 1 TABLET ONCE A DAY FOR 30 DAYS  3 02/14/2016 at Unknown time  . morphine (MS CONTIN) 15 MG 12 hr tablet Take 15 mg by mouth every 12 (twelve) hours.  0 02/14/2016 at Unknown time  . nitrofurantoin (MACRODANTIN) 50 MG capsule TAKE 1 CAPSULE BY MOUTH AT BEDTIME FOR 30 DAYS  3 02/13/2016 at Unknown time  . Nivolumab (OPDIVO IV) Inject into the vein.   Past Week at Unknown time  . oxyCODONE (OXY IR/ROXICODONE) 5 MG immediate release tablet Take 5 mg by mouth every 8 (eight) hours as needed (patient takes 2 tabs in the evening only).    prn at prn  . senna (SENOKOT) 8.6 MG tablet Take 2 tablets by mouth at bedtime.    02/13/2016 at Unknown time  . sertraline (ZOLOFT) 100 MG tablet Take 100 mg by mouth at bedtime.  1 02/13/2016 at Unknown time  . sulfamethoxazole-trimethoprim (BACTRIM DS,SEPTRA DS) 800-160 MG tablet Take 1 tablet by mouth 2 (two) times daily. X 5 days now. Also begin 3 days before next Urology surgery 16 tablet 0 02/14/2016 at Unknown time  . tamsulosin (FLOMAX) 0.4 MG CAPS capsule Take 0.8 mg by  mouth at bedtime.   02/13/2016 at Unknown time  . traZODone (DESYREL) 50 MG tablet Take 50 mg by mouth at bedtime.   3 02/13/2016 at Unknown time  . XARELTO 20 MG TABS tablet Take 20 mg by mouth daily.  1 Past Week at Unknown time   Social History   Social History  . Marital status: Married    Spouse name: N/A  . Number of children: N/A  . Years of education: N/A   Occupational History  . Not on file.   Social History Main Topics  . Smoking status: Never Smoker  . Smokeless tobacco: Never Used  . 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 Drug use: No  . Sexual activity: Not on file   Other Topics Concern  . Not on file   Social History Narrative  . No narrative on file     Family History  Problem Relation Age of Onset  . Hypertension Father   . Heart attack Father   . Stroke Sister   . Hypertension Brother   . Stroke Maternal Uncle       Review of systems complete and found to be negative unless listed above      PHYSICAL EXAM  General: Well developed, well nourished, in no acute distress HEENT:  Normocephalic and atramatic Neck:  No JVD.  Lungs: Clear bilaterally to auscultation and percussion. Heart: HRRR . Normal S1 and S2 without gallops or murmurs.  Abdomen: Bowel sounds are positive, abdomen soft and non-tender  Msk:  Back normal, normal gait. Normal strength and tone for age. Extremities: No clubbing, cyanosis or edema.   Neuro: Alert and oriented X 3. Psych:  Good affect, responds appropriately  Labs:   Lab Results  Component Value Date   WBC 4.4 02/15/2016   HGB 9.0 (L) 02/15/2016   HCT 26.6 (L) 02/15/2016   MCV 93.0 02/15/2016   PLT 139 (L) 02/15/2016    Recent Labs Lab 02/11/16 0831  02/15/16 0541  NA 136  < > 139  K 5.0  < > 4.5  CL 104  < > 107  CO2 27  < > 28  BUN 24*  < > 27*  CREATININE 1.28*  < > 1.48*  CALCIUM 8.6*  < > 8.6*  PROT 6.5  --   --   BILITOT 0.5  --   --   ALKPHOS 71  --   --   ALT 12*  --   --   AST 15  --   --   GLUCOSE 113*  < > 108*  < > = values in this interval not displayed. Lab Results  Component Value Date   TROPONINI 0.09 ([place] 02/15/2016   No results found for: CHOL No results found for: HDL No results found for: LDLCALC No results found for: TRIG No results found for: CHOLHDL No results found for: LDLDIRECT    Radiology: Dg Chest 2 View  Result Date: 02/14/2016 CLINICAL DATA:  Shortness of breath since this morning. EXAM: CHEST  2 VIEW COMPARISON:  Chest CT 12/14/2015 FINDINGS: The cardiac silhouette, mediastinal and hilar contours are stable. Stable pulmonary nodules. No infiltrates or effusions. The bony thorax is stable. The stable fusion hardware. IMPRESSION: No acute  cardiopulmonary findings.  Stable pulmonary nodules. Electronically Signed   By: PMarijo SanesM.D.   On: 02/14/2016 16:35   UK[place]Venous Img Lower Unilateral Left  Result Date: 02/07/2016 CLINICAL DATA:  Chronic  left lower extremity swelling and pain. History of popliteal DVT. EXAM: LEFT LOWER EXTREMITY VENOUS DOPPLER ULTRASOUND TECHNIQUE: Gray-scale sonography with graded compression, as well as color Doppler and duplex ultrasound were performed to evaluate the lower extremity deep venous systems from the level of the common femoral vein and including the common femoral, femoral, profunda femoral, popliteal and calf veins including the posterior tibial, peroneal and gastrocnemius veins when visible. The superficial great saphenous vein was also interrogated. Spectral Doppler was utilized to evaluate flow at rest and with distal augmentation maneuvers in the common femoral, femoral and popliteal veins. COMPARISON:  None. FINDINGS: Contralateral Common Femoral Vein: Respiratory phasicity is normal and symmetric with the symptomatic side. No evidence of thrombus. Normal compressibility. Common Femoral Vein: No evidence of thrombus. Normal compressibility, respiratory phasicity and response to augmentation. Saphenofemoral Junction: No evidence of thrombus. Normal compressibility and flow on color Doppler imaging. Profunda Femoral Vein: No evidence of thrombus. Normal compressibility and flow on color Doppler imaging. Femoral Vein: No evidence of thrombus. Normal compressibility, respiratory phasicity and response to augmentation. Popliteal Vein: No evidence of thrombus. Normal compressibility, respiratory phasicity and response to augmentation. Calf Veins: Not well visualized because of peripheral edema. Superficial Great Saphenous Vein: No evidence of thrombus. Normal compressibility and flow on color Doppler imaging. Venous Reflux:  None. Other Findings: Lower extremity peripheral subcutaneous edema noted.  IMPRESSION: Negative for significant left lower extremity DVT. Limited assessment of the calf veins. Peripheral subcutaneous edema. Electronically Signed   By: Jerilynn Mages.  Shick M.D.   On: 02/07/2016 16:45   Ct Outside Films Chest  Result Date: 01/27/2016 This examination belongs to an outside facility and is stored here for comparison purposes only.  Contact the originating outside institution for any associated report or interpretation.   EKG: Normal sinus rhythm  ASSESSMENT AND PLAN:   1. Exertional dyspnea, questionable etiology, without chest pain, with borderline elevated troponin, with cystoscopy pending 2. Known CAD, status post prior coronary stent, currently without chest pain  Recommendations  1. Agree with current therapy 2. Lexiscan sestamibi study today   Signed: Adda Stokes MD,PhD, [facility] 02/15/2016, 8:20 AM

## 2016-02-15 NOTE — Progress Notes (Signed)
Reviewed

## 2016-02-15 NOTE — Progress Notes (Signed)
[place] at [facility]                                                                                                                                                                                            Patient Demographics   Marc Blake, is a 60 y.o. male, DOB - September 03, 1955, KAJ:[id number]  Admit date - 02/14/2016   Admitting Physician Lance Coon, MD  Outpatient Primary MD for the patient is [name], MD   LOS - 1  Subjective: Patient admitted with shortness of breath. He is denying any chest pain.    Review of Systems:   CONSTITUTIONAL: No documented fever. No fatigue, weakness. No weight gain, no weight loss.  EYES: No blurry or double vision.  ENT: No tinnitus. No postnasal drip. No redness of the oropharynx.  RESPIRATORY: No cough, no wheeze, no hemoptysis.Positive for dyspnea.  CARDIOVASCULAR: No chest pain. No orthopnea. No palpitations. No syncope.  GASTROINTESTINAL: No nausea, no vomiting or diarrhea. No abdominal pain. No melena or hematochezia.  GENITOURINARY: No dysuria or hematuria.  ENDOCRINE: No polyuria or nocturia. No heat or cold intolerance.  HEMATOLOGY: No anemia. No bruising. No bleeding.  INTEGUMENTARY: No rashes. No lesions.  MUSCULOSKELETAL: No arthritis. No swelling. No gout.  NEUROLOGIC: No numbness, tingling, or ataxia. No seizure-type activity.  PSYCHIATRIC: No anxiety. No insomnia. No ADD.    Vitals:   Vitals:   02/14/16 2330 02/15/16 0000 02/15/16 0041 02/15/16 0358  BP: (!) 118/55 (!) 108/51 (!) 135/54 (!) 109/53  Pulse: 60 62 66 64  Resp: (!) 21 (!) '21 16 18  '$ Temp:   98 F (36.7 C) 97.6 F (36.4 C)  TempSrc:   Oral Oral  SpO2: 94% 100% 98% 96%  Weight:      Height:        Wt Readings from Last 3 Encounters:  02/14/16 122.5 kg (270 lb)  02/14/16 122.9 kg (270 lb 15.1 oz)  02/11/16 120.8 kg (266 lb 5.1 oz)     Intake/Output Summary (Last 24 hours) at 02/15/16 1257 Last  data filed at 02/15/16 0400  Gross per 24 hour  Intake               68 ml  Output                0 ml  Net               68 ml    Physical Exam:   GENERAL: Pleasant-appearing in no apparent distress.  HEAD, EYES, EARS, NOSE AND THROAT: Atraumatic, normocephalic. Extraocular muscles are intact. Pupils  equal and reactive to light. Sclerae anicteric. No conjunctival injection. No oro-pharyngeal erythema.  NECK: Supple. There is no jugular venous distention. No bruits, no lymphadenopathy, no thyromegaly.  HEART: Regular rate and rhythm,. No murmurs, no rubs, no clicks.  LUNGS: Clear to auscultation bilaterally. No rales or rhonchi. No wheezes.  ABDOMEN: Soft, flat, nontender, nondistended. Has good bowel sounds. No hepatosplenomegaly appreciated.  EXTREMITIES left leg warm to touch and erythematous NEUROLOGIC: The patient is alert, awake, and oriented x3 with no focal motor or sensory deficits appreciated bilaterally.  SKIN: Moist and warm with no rashes appreciated.  Psych: Not anxious, depressed LN: No inguinal LN enlargement    Antibiotics   Anti-infectives    Start     Dose/Rate Route Frequency Ordered Stop   02/15/16 1200  ampicillin-sulbactam (UNASYN) 1.5 g in sodium chloride 0.9 % 50 mL IVPB     1.5 g 100 mL/hr over 30 Minutes Intravenous Every 6 hours 02/15/16 1047     02/15/16 1000  Ampicillin-Sulbactam (UNASYN) 3 g in sodium chloride 0.9 % 100 mL IVPB  Status:  Discontinued     3 g 100 mL/hr over 60 Minutes Intravenous Every 6 hours 02/15/16 0919 02/15/16 1047      Medications   Scheduled Meds: . amLODipine  2.5 mg Oral Daily  . ampicillin-sulbactam (UNASYN) IV  1.5 g Intravenous Q6H  . atorvastatin  20 mg Oral q1800  . busPIRone  5 mg Oral TID  . carvedilol  12.5 mg Oral BID WC  . gabapentin  300 mg Oral TID  . lisinopril  20 mg Oral Daily  . morphine  15 mg Oral Q12H  . senna  2 tablet Oral QHS  . sertraline  100 mg Oral QHS  . sodium chloride flush  3 mL  Intravenous Q12H  . tamsulosin  0.8 mg Oral QHS   Continuous Infusions: . heparin 1,600 Units/hr (02/15/16 1236)   PRN Meds:.acetaminophen **OR** acetaminophen, ondansetron **OR** ondansetron (ZOFRAN) IV, oxyCODONE   Data Review:   Micro Results No results found for this or any previous visit (from the past 240 hour(s)).  Radiology Reports Dg Chest 2 View  Result Date: 02/14/2016 CLINICAL DATA:  Shortness of breath since this morning. EXAM: CHEST  2 VIEW COMPARISON:  Chest CT 12/14/2015 FINDINGS: The cardiac silhouette, mediastinal and hilar contours are stable. Stable pulmonary nodules. No infiltrates or effusions. The bony thorax is stable. The stable fusion hardware. IMPRESSION: No acute cardiopulmonary findings.  Stable pulmonary nodules. Electronically Signed   By: Marijo Sanes M.D.   On: 02/14/2016 16:35   Ct Angio Chest Pe W Or Wo Contrast  Result Date: 02/15/2016 CLINICAL DATA:  Chest pain and shortness of breath. History of metastatic renal cell carcinoma. EXAM: CT ANGIOGRAPHY CHEST WITH CONTRAST TECHNIQUE: Multidetector CT imaging of the chest was performed using the standard protocol during bolus administration of intravenous contrast. Multiplanar CT image reconstructions and MIPs were obtained to evaluate the vascular anatomy. CONTRAST:  75 cc Isovue 370 COMPARISON:  CT scan 12/14/2015 FINDINGS: Chest wall: No chest wall mass, supraclavicular or axillary lymphadenopathy. Small scattered lymph nodes are noted. The thyroid gland is grossly normal. Cardiovascular: The heart is normal in size. No pericardial effusion. Some fluid noted in the pericardial recesses. Stable tortuosity and calcification of the thoracic aorta. No dissection or focal aneurysm. The branch vessels are patent. Advanced three-vessel Coronary artery calcifications for age. The pulmonary arterial tree is fairly well opacified. No filling defects to suggest pulmonary embolism. Mediastinum/Nodes:  Small scattered  mediastinal and hilar lymph nodes are noted. Slight interval enlargement. The prevascular lymph node on image number 29 measures 9 mm and previously measured 6.5 mm. Right paratracheal lymph node on image 19 measures 8.5 mm and previously measured 5.5 mm. Subcarinal lymph node on image number 39 measures 10 mm and previously measured 8 mm. Left infrahilar lymph node on image number 41 measures 14.5 mm and previously measured 6.5 mm. Lungs/Pleura: Progressive pulmonary metastatic lesions. The 10 mm right upper lobe nodule near the SVC and image 20 previously measured 6 mm. 10 mm left upper lobe pulmonary nodule on image 35 previously measured 4.5 mm. The left lower lobe pulmonary nodule on image 58 measures 17 mm and previously measured 13 mm. 9 mm right middle lobe pulmonary nodule on image 51 previously measured 6 mm. New small nodules are also noted. No acute pulmonary findings. Stable postoperative and possible radiation changes in the left lower lobe adjacent to the spinal fusion hardware. Upper Abdomen: No evidence of hepatic metastatic disease. Stable enhancing right adrenal gland nodule, likely metastasis. The top of the left renal lesion is identified. Musculoskeletal: No new metastatic bone lesions are identified. Review of the MIP images confirms the above findings. IMPRESSION: 1. No CT findings for pulmonary embolism. 2. Stable mild tortuosity and calcification of the thoracic aorta but no dissection or focal aneurysm. 3. Slight progression of mediastinal and left hilar adenopathy. 4. Slight progression of pulmonary metastatic disease. 5. Age advanced three-vessel coronary artery calcifications. 6. Stable surgical changes and possible radiation changes in the lower lung zones adjacent to the fusion hardware. Electronically Signed   By: Marijo Sanes M.D.   On: 02/15/2016 12:40   US Venous Img Lower Unilateral Left  Result Date: 02/07/2016 CLINICAL DATA:  Chronic left lower extremity swelling and pain.  History of popliteal DVT. EXAM: LEFT LOWER EXTREMITY VENOUS DOPPLER ULTRASOUND TECHNIQUE: Gray-scale sonography with graded compression, as well as color Doppler and duplex ultrasound were performed to evaluate the lower extremity deep venous systems from the level of the common femoral vein and including the common femoral, femoral, profunda femoral, popliteal and calf veins including the posterior tibial, peroneal and gastrocnemius veins when visible. The superficial great saphenous vein was also interrogated. Spectral Doppler was utilized to evaluate flow at rest and with distal augmentation maneuvers in the common femoral, femoral and popliteal veins. COMPARISON:  None. FINDINGS: Contralateral Common Femoral Vein: Respiratory phasicity is normal and symmetric with the symptomatic side. No evidence of thrombus. Normal compressibility. Common Femoral Vein: No evidence of thrombus. Normal compressibility, respiratory phasicity and response to augmentation. Saphenofemoral Junction: No evidence of thrombus. Normal compressibility and flow on color Doppler imaging. Profunda Femoral Vein: No evidence of thrombus. Normal compressibility and flow on color Doppler imaging. Femoral Vein: No evidence of thrombus. Normal compressibility, respiratory phasicity and response to augmentation. Popliteal Vein: No evidence of thrombus. Normal compressibility, respiratory phasicity and response to augmentation. Calf Veins: Not well visualized because of peripheral edema. Superficial Great Saphenous Vein: No evidence of thrombus. Normal compressibility and flow on color Doppler imaging. Venous Reflux:  None. Other Findings: Lower extremity peripheral subcutaneous edema noted. IMPRESSION: Negative for significant left lower extremity DVT. Limited assessment of the calf veins. Peripheral subcutaneous edema. Electronically Signed   By: Jerilynn Mages.  Shick M.D.   On: 02/07/2016 16:45   Ct Outside Films Chest  Result Date: 01/27/2016 This  examination belongs to an outside facility and is stored here for comparison purposes only.  Contact  the originating outside institution for any associated report or interpretation.    CBC  Recent Labs Lab 02/11/16 0831 02/14/16 1535 02/15/16 0541  WBC 6.6 6.4 4.4  HGB 10.1* 9.4* 9.0*  HCT 30.2* 28.2* 26.6*  PLT 159 159 139*  MCV 94.8 95.1 93.0  MCH 31.6 31.8 31.5  MCHC 33.3 33.5 33.8  RDW 18.7* 18.1* 17.8*  LYMPHSABS 0.6*  --   --   MONOABS 1.0  --   --   EOSABS 0.2  --   --   BASOSABS 0.0  --   --     Chemistries   Recent Labs Lab 02/11/16 0831 02/14/16 1339 02/14/16 1535 02/15/16 0541  NA 136  --  137 139  K 5.0  --  4.6 4.5  CL 104  --  105 107  CO2 27  --  26 28  GLUCOSE 113*  --  106* 108*  BUN 24*  --  29* 27*  CREATININE 1.28*  --  1.69* 1.48*  CALCIUM 8.6*  --  8.8* 8.6*  MG 2.1 2.1  --   --   AST 15  --   --   --   ALT 12*  --   --   --   ALKPHOS 71  --   --   --   BILITOT 0.5  --   --   --    ------------------------------------------------------------------------------------------------------------------ estimated creatinine clearance is 74.8 mL/min (by C-G formula based on SCr of 1.48 mg/dL). ------------------------------------------------------------------------------------------------------------------ No results for input(s): HGBA1C in the last 72 hours. ------------------------------------------------------------------------------------------------------------------ No results for input(s): CHOL, HDL, LDLCALC, TRIG, CHOLHDL, LDLDIRECT in the last 72 hours. ------------------------------------------------------------------------------------------------------------------ No results for input(s): TSH, T4TOTAL, T3FREE, THYROIDAB in the last 72 hours.  Invalid input(s): FREET3 ------------------------------------------------------------------------------------------------------------------ No results for input(s): VITAMINB12, FOLATE, FERRITIN,  TIBC, IRON, RETICCTPCT in the last 72 hours.  Coagulation profile  Recent Labs Lab 02/14/16 2243  INR 0.96    No results for input(s): DDIMER in the last 72 hours.  Cardiac Enzymes  Recent Labs Lab 02/14/16 2036 02/15/16 0201 02/15/16 0541  TROPONINI 0.09* 0.09* 0.08*   ------------------------------------------------------------------------------------------------------------------ Invalid input(s): POCBNP    Assessment & Plan   *  Dyspnea on exertion -History of DVT in the past was taken off Xarelto for urological procedure I discussed with urology and they will cancel the procedure good in the future., Will get a CT of the chest to rule out pulmonary embolism, a stress test has been ordered to rule out ischemic causes for her symptoms * Left lower extremity cellulitis start on Unasyn *  CAD (coronary artery disease) - continue home meds, cardiology consult, echocardiogram ordered *  Elevated troponin - trend cardiac enzymes tonight, heparin drip started cardiology consult * Essential hypertension - stable, continue home meds      Code Status Orders        Start     Ordered   02/15/16 0106  Full code  Continuous     02/15/16 0105    Code Status History    Date Active Date Inactive Code Status Order ID Comments User Context   This patient has a current code status but no historical code status.           Consults none  DVT Prophylaxis  scd's  Lab Results  Component Value Date   PLT 139 (L) 02/15/2016     Time Spent in minutes 31mn  Greater than 50% of time spent in care coordination and counseling patient  regarding the condition and plan of care.   Dustin Flock M.D on 02/15/2016 at 12:57 PM  Between 7am to 6pm - Pager - [phone]  After 6pm go to [email or web address] - password [email or web address] [place] [facility]   Office  [phone]

## 2016-02-15 NOTE — Progress Notes (Signed)
Pharmacy Antibiotic Note  Marc Blake is a 60 y.o. male admitted on 02/14/2016 with cellulitis.  Pharmacy has been consulted for ampicillin/sulbactam dosing.  Plan: Ampicillin/sulbactam 1.5 g IV q6h  Height: '6\' 3"'$  (190.5 cm) Weight: 270 lb (122.5 kg) IBW/kg (Calculated) : 84.5  Temp (24hrs), Avg:97.4 F (36.3 C), Min:96.2 F (35.7 C), Max:98 F (36.7 C)   Recent Labs Lab 02/11/16 0831 02/14/16 1535 02/15/16 0541  WBC 6.6 6.4 4.4  CREATININE 1.28* 1.69* 1.48*    Estimated Creatinine Clearance: 74.8 mL/min (by C-G formula based on SCr of 1.48 mg/dL).    No Known Allergies  Antimicrobials this admission: Ampicillin/sulbactam 8/29 >>   Dose adjustments this admission:  Microbiology results: None  Thank you for allowing pharmacy to be a part of this patient's care.  Lenis Noon, PharmD, BCPS Clinical Pharmacist 02/15/2016 10:48 AM

## 2016-02-15 NOTE — Progress Notes (Signed)
ANTICOAGULATION CONSULT NOTE - Initial Consult  Pharmacy Consult for Heparin  Indication: chest pain/ACS and PE/DVT  No Known Allergies  Patient Measurements: Height: '6\' 3"'$  (190.5 cm) Weight: 270 lb (122.5 kg) IBW/kg (Calculated) : 84.5 Heparin Dosing Weight: 110.7 kg   Vital Signs: Temp: 97.6 F (36.4 C) (08/29 0358) Temp Source: Oral (08/29 0358) BP: 109/53 (08/29 0358) Pulse Rate: 64 (08/29 0358)  Labs:  Recent Labs  02/14/16 1535 02/14/16 1749 02/14/16 2036 02/14/16 2243 02/15/16 0201 02/15/16 0541  HGB 9.4*  --   --   --   --  9.0*  HCT 28.2*  --   --   --   --  26.6*  PLT 159  --   --   --   --  139*  APTT  --   --   --  34  --  129*  LABPROT  --   --   --  12.8  --   --   INR  --   --   --  0.96  --   --   HEPARINUNFRC  --   --   --   --   --  0.40  CREATININE 1.69*  --   --   --   --  1.48*  TROPONINI 0.04* 0.06* 0.09*  --  0.09*  --     Estimated Creatinine Clearance: 74.8 mL/min (by C-G formula based on SCr of 1.48 mg/dL).   Medical History: Past Medical History:  Diagnosis Date  . Acid reflux   . Anxiety   . Arthritis   . Depression   . Depression   . Hyperlipidemia   . Hypertension   . Myocardial infarction ([place])   . Renal cancer ([place])   . Renal cell carcinoma ([place])   . Skin cancer     Medications:  Prescriptions Prior to Admission  Medication Sig Dispense Refill Last Dose  . amLODipine (NORVASC) 2.5 MG tablet TAKE 1 TABLET ONCE A DAY FOR 30 DAYS  2 02/14/2016 at Unknown time  . atorvastatin (LIPITOR) 20 MG tablet TAKE 1 TABLET ONCE A DAY (AT BEDTIME) FOR 30 DAYS  2 02/13/2016 at Unknown time  . busPIRone (BUSPAR) 5 MG tablet TAKE 1 TABLET 3 TIMES A DAY FOR 30 DAYS  2 02/14/2016 at Unknown time  . carvedilol (COREG) 12.5 MG tablet TAKE 1 TABLET BY MOUTH TWICE A DAY FOR 30 DAYS  3 02/14/2016 at 0700  . gabapentin (NEURONTIN) 300 MG capsule Take 300 mg by mouth 3 (three) times daily.    02/14/2016 at Unknown time  . ibuprofen (ADVIL,MOTRIN) 200  MG tablet Take 200 mg by mouth every 8 (eight) hours as needed for moderate pain. 4 every 8 hours   prn at prn  . lisinopril (PRINIVIL,ZESTRIL) 20 MG tablet TAKE 1 TABLET ONCE A DAY FOR 30 DAYS  3 02/14/2016 at Unknown time  . morphine (MS CONTIN) 15 MG 12 hr tablet Take 15 mg by mouth every 12 (twelve) hours.  0 02/14/2016 at Unknown time  . nitrofurantoin (MACRODANTIN) 50 MG capsule TAKE 1 CAPSULE BY MOUTH AT BEDTIME FOR 30 DAYS  3 02/13/2016 at Unknown time  . Nivolumab (OPDIVO IV) Inject into the vein.   Past Week at Unknown time  . oxyCODONE (OXY IR/ROXICODONE) 5 MG immediate release tablet Take 5 mg by mouth every 8 (eight) hours as needed (patient takes 2 tabs in the evening only).    prn at prn  . senna (SENOKOT) 8.6  MG tablet Take 2 tablets by mouth at bedtime.    02/13/2016 at Unknown time  . sertraline (ZOLOFT) 100 MG tablet Take 100 mg by mouth at bedtime.  1 02/13/2016 at Unknown time  . sulfamethoxazole-trimethoprim (BACTRIM DS,SEPTRA DS) 800-160 MG tablet Take 1 tablet by mouth 2 (two) times daily. X 5 days now. Also begin 3 days before next Urology surgery 16 tablet 0 02/14/2016 at Unknown time  . tamsulosin (FLOMAX) 0.4 MG CAPS capsule Take 0.8 mg by mouth at bedtime.   02/13/2016 at Unknown time  . traZODone (DESYREL) 50 MG tablet Take 50 mg by mouth at bedtime.   3 02/13/2016 at Unknown time  . XARELTO 20 MG TABS tablet Take 20 mg by mouth daily.  1 Past Week at Unknown time    Assessment: Pt was on Xarelto at home but had not taken a dose since 8/22.  CrCl = 65.5 ml/min   Goal of Therapy:  Heparin level 0.3-0.7 units/ml Monitor platelets by anticoagulation protocol: Yes   Plan:  First heparin level therapeutic 0.4, aPTT supratherapeutic 129. Decrease to 1600 units/hr, will recheck heparin level in 6 hours.  Laural Benes, Pharm.D., BCPS Clinical Pharmacist 02/15/2016,7:03 AM

## 2016-02-15 NOTE — Progress Notes (Signed)
Patient off the unit to radiology .

## 2016-02-15 NOTE — Progress Notes (Signed)
Patient returned to unit, heparin drip D/C, diet order placed, no distress noted

## 2016-02-15 NOTE — Care Management (Signed)
Attempted to assess patient and he is not in room.  Patient presents from home with spouse with dyspnea on exertion. Positive troponin, heparin gtt. Stress test and CT pending.  PCP is [name]

## 2016-02-16 ENCOUNTER — Telehealth: Payer: Self-pay | Admitting: Urology

## 2016-02-16 LAB — BASIC METABOLIC PANEL
Anion gap: 5 (ref 5–15)
BUN: 24 mg/dL — AB (ref 6–20)
CALCIUM: 8.5 mg/dL — AB (ref 8.9–10.3)
CHLORIDE: 107 mmol/L (ref 101–111)
CO2: 30 mmol/L (ref 22–32)
CREATININE: 1.33 mg/dL — AB (ref 0.61–1.24)
GFR calc non Af Amer: 57 mL/min — ABNORMAL LOW (ref >60)
Glucose, Bld: 126 mg/dL — ABNORMAL HIGH (ref 65–99)
Potassium: 4.6 mmol/L (ref 3.5–5.1)
SODIUM: 142 mmol/L (ref 135–145)

## 2016-02-16 LAB — CBC
HCT: 27.4 % — ABNORMAL LOW (ref 40.0–52.0)
HEMOGLOBIN: 9 g/dL — AB (ref 13.0–18.0)
MCH: 30.9 pg (ref 26.0–34.0)
MCHC: 32.8 g/dL (ref 32.0–36.0)
MCV: 94.2 fL (ref 80.0–100.0)
PLATELETS: 155 10*3/uL (ref 150–440)
RBC: 2.91 MIL/uL — ABNORMAL LOW (ref 4.40–5.90)
RDW: 18.2 % — AB (ref 11.5–14.5)
WBC: 5.8 10*3/uL (ref 3.8–10.6)

## 2016-02-16 MED ORDER — AMOXICILLIN-POT CLAVULANATE 875-125 MG PO TABS: 1.0000 | ORAL_TABLET | Freq: Two times a day (BID) | ORAL | 0 refills | Status: AC

## 2016-02-16 NOTE — Progress Notes (Signed)
Patient refused bed alarm, socks are on. Safety discussed. Will monitor.

## 2016-02-16 NOTE — Discharge Summary (Signed)
Marc Blake, 60 y.o., DOB 08-31-1955, MRN [id number]. Admission date: 02/14/2016 Discharge Date 02/16/2016 Primary MD [name], MD Admitting Physician Lance Coon, MD  Admission Diagnosis  Dyspnea [R06.00] NSTEMI (non-ST elevated myocardial infarction) [facility]) [I21.4]  Discharge Diagnosis   Principal Problem:   Dyspnea on exertion non-ST MI ruled out   Left lower extremity cellulitis   CAD (coronary artery disease)   Essential hypertension   Metastatic cancer ([place])   Elevated troponin    Psoriasis        Hospital Course Patient is a 60 year old male with history of metastatic cancer presented with shortness of breath. Patient has a history of coronary artery disease with previous stent. An MI in the past he recently was supposed to have urological procedure and Xarelto was held. Due to this he came to the ER. He underwent a CT of PE and rule out PE which was negative. H his CT did show progression of metastatic disease. He also had a stress test which showed no evidence of ischemia. His dyspnea was felt to be likely due to progression of metastatic disease. She also needs outpatient PFTs to further evaluate his symptoms. His restarted on Xarelto I discussed the case with Dr. Erlene Quan of urology who states that the procedure was nonemergent and hack can have this done later. Patient also had a recent left lower extremity Doppler which was negative for DVT. He did have erythema therefore he started on antibiotics.           Consults  cardiology  Significant Tests:  See full reports for all details     Dg Chest 2 View  Result Date: 02/14/2016 CLINICAL DATA:  Shortness of breath since this morning. EXAM: CHEST  2 VIEW COMPARISON:  Chest CT 12/14/2015 FINDINGS: The cardiac silhouette, mediastinal and hilar contours are stable. Stable pulmonary nodules. No infiltrates or effusions. The bony thorax is stable. The stable fusion hardware. IMPRESSION: No acute cardiopulmonary  findings.  Stable pulmonary nodules. Electronically Signed   By: Marijo Sanes M.D.   On: 02/14/2016 16:35   Ct Angio Chest Pe W Or Wo Contrast  Result Date: 02/15/2016 CLINICAL DATA:  Chest pain and shortness of breath. History of metastatic renal cell carcinoma. EXAM: CT ANGIOGRAPHY CHEST WITH CONTRAST TECHNIQUE: Multidetector CT imaging of the chest was performed using the standard protocol during bolus administration of intravenous contrast. Multiplanar CT image reconstructions and MIPs were obtained to evaluate the vascular anatomy. CONTRAST:  75 cc Isovue 370 COMPARISON:  CT scan 12/14/2015 FINDINGS: Chest wall: No chest wall mass, supraclavicular or axillary lymphadenopathy. Small scattered lymph nodes are noted. The thyroid gland is grossly normal. Cardiovascular: The heart is normal in size. No pericardial effusion. Some fluid noted in the pericardial recesses. Stable tortuosity and calcification of the thoracic aorta. No dissection or focal aneurysm. The branch vessels are patent. Advanced three-vessel Coronary artery calcifications for age. The pulmonary arterial tree is fairly well opacified. No filling defects to suggest pulmonary embolism. Mediastinum/Nodes: Small scattered mediastinal and hilar lymph nodes are noted. Slight interval enlargement. The prevascular lymph node on image number 29 measures 9 mm and previously measured 6.5 mm. Right paratracheal lymph node on image 19 measures 8.5 mm and previously measured 5.5 mm. Subcarinal lymph node on image number 39 measures 10 mm and previously measured 8 mm. Left infrahilar lymph node on image number 41 measures 14.5 mm and previously measured 6.5 mm. Lungs/Pleura: Progressive pulmonary metastatic lesions. The 10 mm right upper lobe nodule near  the SVC and image 20 previously measured 6 mm. 10 mm left upper lobe pulmonary nodule on image 35 previously measured 4.5 mm. The left lower lobe pulmonary nodule on image 58 measures 17 mm and previously  measured 13 mm. 9 mm right middle lobe pulmonary nodule on image 51 previously measured 6 mm. New small nodules are also noted. No acute pulmonary findings. Stable postoperative and possible radiation changes in the left lower lobe adjacent to the spinal fusion hardware. Upper Abdomen: No evidence of hepatic metastatic disease. Stable enhancing right adrenal gland nodule, likely metastasis. The top of the left renal lesion is identified. Musculoskeletal: No new metastatic bone lesions are identified. Review of the MIP images confirms the above findings. IMPRESSION: 1. No CT findings for pulmonary embolism. 2. Stable mild tortuosity and calcification of the thoracic aorta but no dissection or focal aneurysm. 3. Slight progression of mediastinal and left hilar adenopathy. 4. Slight progression of pulmonary metastatic disease. 5. Age advanced three-vessel coronary artery calcifications. 6. Stable surgical changes and possible radiation changes in the lower lung zones adjacent to the fusion hardware. Electronically Signed   By: Marijo Sanes M.D.   On: 02/15/2016 12:40   Nm Myocar Multi W/spect W/wall Motion / Ef  Result Date: 02/15/2016  Blood pressure demonstrated a normal response to exercise.  There was no ST segment deviation noted during stress.  Defect 1: There is a small defect of mild severity present in the apex location.  Findings consistent with prior myocardial infarction.  This is a low risk study.  The left ventricular ejection fraction is moderately decreased (30-44%).    US Venous Img Lower Unilateral Left  Result Date: 02/07/2016 CLINICAL DATA:  Chronic left lower extremity swelling and pain. History of popliteal DVT. EXAM: LEFT LOWER EXTREMITY VENOUS DOPPLER ULTRASOUND TECHNIQUE: Gray-scale sonography with graded compression, as well as color Doppler and duplex ultrasound were performed to evaluate the lower extremity deep venous systems from the level of the common femoral vein and  including the common femoral, femoral, profunda femoral, popliteal and calf veins including the posterior tibial, peroneal and gastrocnemius veins when visible. The superficial great saphenous vein was also interrogated. Spectral Doppler was utilized to evaluate flow at rest and with distal augmentation maneuvers in the common femoral, femoral and popliteal veins. COMPARISON:  None. FINDINGS: Contralateral Common Femoral Vein: Respiratory phasicity is normal and symmetric with the symptomatic side. No evidence of thrombus. Normal compressibility. Common Femoral Vein: No evidence of thrombus. Normal compressibility, respiratory phasicity and response to augmentation. Saphenofemoral Junction: No evidence of thrombus. Normal compressibility and flow on color Doppler imaging. Profunda Femoral Vein: No evidence of thrombus. Normal compressibility and flow on color Doppler imaging. Femoral Vein: No evidence of thrombus. Normal compressibility, respiratory phasicity and response to augmentation. Popliteal Vein: No evidence of thrombus. Normal compressibility, respiratory phasicity and response to augmentation. Calf Veins: Not well visualized because of peripheral edema. Superficial Great Saphenous Vein: No evidence of thrombus. Normal compressibility and flow on color Doppler imaging. Venous Reflux:  None. Other Findings: Lower extremity peripheral subcutaneous edema noted. IMPRESSION: Negative for significant left lower extremity DVT. Limited assessment of the calf veins. Peripheral subcutaneous edema. Electronically Signed   By: Jerilynn Mages.  Shick M.D.   On: 02/07/2016 16:45   Ct Outside Films Chest  Result Date: 01/27/2016 This examination belongs to an outside facility and is stored here for comparison purposes only.  Contact the originating outside institution for any associated report or interpretation.  Today   Subjective:   Marc Blake feeling better denies any chest pain or shortness of breath   Objective:   Blood pressure 125/61, pulse 65, temperature 97.9 F (36.6 C), temperature source Oral, resp. rate 18, height '6\' 3"'$  (1.905 m), weight 122.5 kg (270 lb), SpO2 96 %.  .  Intake/Output Summary (Last 24 hours) at 02/16/16 1501 Last data filed at 02/16/16 1014  Gross per 24 hour  Intake              480 ml  Output              700 ml  Net             -220 ml    Exam VITAL SIGNS: Blood pressure 125/61, pulse 65, temperature 97.9 F (36.6 C), temperature source Oral, resp. rate 18, height '6\' 3"'$  (1.905 m), weight 122.5 kg (270 lb), SpO2 96 %.  GENERAL:  61 y.o.-year-old patient lying in the bed with no acute distress.  EYES: Pupils equal, round, reactive to light and accommodation. No scleral icterus. Extraocular muscles intact.  HEENT: Head atraumatic, normocephalic. Oropharynx and nasopharynx clear.  NECK:  Supple, no jugular venous distention. No thyroid enlargement, no tenderness.  LUNGS: Normal breath sounds bilaterally, no wheezing, rales,rhonchi or crepitation. No use of accessory muscles of respiration.  CARDIOVASCULAR: S1, S2 normal. No murmurs, rubs, or gallops.  ABDOMEN: Soft, nontender, nondistended. Bowel sounds present. No organomegaly or mass.  EXTREMITIES:Left leg erythema and swelling  NEUROLOGIC: Cranial nerves II through XII are intact. Muscle strength 5/5 in all extremities. Sensation intact. Gait not checked.  PSYCHIATRIC: The patient is alert and oriented x 3.  SKIN: No obvious rash, lesion, or ulcer.   Data Review     CBC w Diff: Lab Results  Component Value Date   WBC 5.8 02/16/2016   HGB 9.0 (L) 02/16/2016   HCT 27.4 (L) 02/16/2016   PLT 155 02/16/2016   LYMPHOPCT 9 02/11/2016   MONOPCT 16 02/11/2016   EOSPCT 3 02/11/2016   BASOPCT 0 02/11/2016   CMP: Lab Results  Component Value Date   NA 142 02/16/2016   K 4.6 02/16/2016   CL 107 02/16/2016   CO2 30 02/16/2016   BUN 24 (H) 02/16/2016   CREATININE 1.33 (H) 02/16/2016   PROT 6.5  02/11/2016   ALBUMIN 3.6 02/11/2016   BILITOT 0.5 02/11/2016   ALKPHOS 71 02/11/2016   AST 15 02/11/2016   ALT 12 (L) 02/11/2016  .  Micro Results No results found for this or any previous visit (from the past 240 hour(s)).   Code Status History    Date Active Date Inactive Code Status Order ID Comments User Context   02/15/2016  1:05 AM 02/16/2016  1:27 PM Full Code [id number]  Lance Coon, MD Inpatient          Follow-up Information    [name], MD Follow up in 2 week(s).   Specialty:  Family Medicine Why:  Tuesday, September 5th 2pm, [facility] Contact information: [place] [address] [place], MD Follow up in 1 week(s).   Specialty:  Cardiology Why:  Thursday, September 7th at [place], [employer] information: [address] [facility] [place] [address] [phone]        Wallene Huh, MD Follow up in 7 day(s).   Specialty:  Specialist Why:  Thursday, September 14th at 11am, Architectural technologist  information: [place] [id number] [phone]           Discharge Medications     Medication List    STOP taking these medications   sulfamethoxazole-trimethoprim 800-160 MG tablet Commonly known as:  BACTRIM DS,SEPTRA DS     TAKE these medications   amLODipine 2.5 MG tablet Commonly known as:  NORVASC TAKE 1 TABLET ONCE A DAY FOR 30 DAYS   amoxicillin-clavulanate 875-125 MG tablet Commonly known as:  AUGMENTIN Take 1 tablet by mouth 2 (two) times daily.   atorvastatin 20 MG tablet Commonly known as:  LIPITOR TAKE 1 TABLET ONCE A DAY (AT BEDTIME) FOR 30 DAYS   busPIRone 5 MG tablet Commonly known as:  BUSPAR TAKE 1 TABLET 3 TIMES A DAY FOR 30 DAYS   carvedilol 12.5 MG tablet Commonly known as:  COREG TAKE 1 TABLET BY MOUTH TWICE A DAY FOR 30 DAYS   gabapentin 300 MG  capsule Commonly known as:  NEURONTIN Take 300 mg by mouth 3 (three) times daily.   ibuprofen 200 MG tablet Commonly known as:  ADVIL,MOTRIN Take 200 mg by mouth every 8 (eight) hours as needed for moderate pain. 4 every 8 hours   lisinopril 20 MG tablet Commonly known as:  PRINIVIL,ZESTRIL TAKE 1 TABLET ONCE A DAY FOR 30 DAYS   morphine 15 MG 12 hr tablet Commonly known as:  MS CONTIN Take 15 mg by mouth every 12 (twelve) hours.   nitrofurantoin 50 MG capsule Commonly known as:  MACRODANTIN TAKE 1 CAPSULE BY MOUTH AT BEDTIME FOR 30 DAYS   OPDIVO IV Inject into the vein.   oxyCODONE 5 MG immediate release tablet Commonly known as:  Oxy IR/ROXICODONE Take 5 mg by mouth every 8 (eight) hours as needed (patient takes 2 tabs in the evening only).   senna 8.6 MG tablet Commonly known as:  SENOKOT Take 2 tablets by mouth at bedtime.   sertraline 100 MG tablet Commonly known as:  ZOLOFT Take 100 mg by mouth at bedtime.   tamsulosin 0.4 MG Caps capsule Commonly known as:  FLOMAX Take 0.8 mg by mouth at bedtime.   traZODone 50 MG tablet Commonly known as:  DESYREL Take 50 mg by mouth at bedtime.   XARELTO 20 MG Tabs tablet Generic drug:  rivaroxaban Take 20 mg by mouth daily.          Total Time in preparing paper work, data evaluation and todays exam - 35 minutes  Dustin Flock M.D on 02/16/2016 at 3:01 [facility]  [facility]   Office  [phone]

## 2016-02-16 NOTE — Care Management (Signed)
Met with patient at bedside. He is independent, active and drives. Continues to work. Lives with wife. No needs identified for discharge.

## 2016-02-16 NOTE — Progress Notes (Signed)
Patient d/c'd at this time. Instructions provided and reviewed, IV removed. Wenda Low [facility]

## 2016-02-16 NOTE — Progress Notes (Signed)
A&O. Independent. Heparin drip increased to 14.5. Meds given for pain during the night.

## 2016-02-16 NOTE — Telephone Encounter (Signed)
Aware of patient's inpatient admission for SOB.  CT chest/PE negative, stress test also negative (low risk).   Will work with my office staff to reschedule cystoscopy, urethral dilation as outpatient.    If patient unable to hold anticoagulation for procedure, will need to arrange for lovenox bridge to be held on day of the surgery.   [name], MD

## 2016-02-17 ENCOUNTER — Telehealth: Payer: Self-pay | Admitting: *Deleted

## 2016-02-18 NOTE — Telephone Encounter (Signed)
LMOM. Need to discuss rescheduling surgery with Dr Erlene Quan.

## 2016-02-22 ENCOUNTER — Telehealth: Payer: Self-pay

## 2016-02-22 NOTE — Telephone Encounter (Signed)
Lab called the icd code on patients labwork is for a pregnancy test. They need correct code. Please call.

## 2016-02-25 ENCOUNTER — Other Ambulatory Visit: Payer: Self-pay

## 2016-02-25 ENCOUNTER — Inpatient Hospital Stay: Payer: 59

## 2016-02-25 ENCOUNTER — Other Ambulatory Visit: Payer: Self-pay | Admitting: *Deleted

## 2016-02-25 ENCOUNTER — Inpatient Hospital Stay: Payer: 59 | Attending: Hematology and Oncology

## 2016-02-25 ENCOUNTER — Inpatient Hospital Stay (HOSPITAL_BASED_OUTPATIENT_CLINIC_OR_DEPARTMENT_OTHER): Payer: 59 | Admitting: Hematology and Oncology

## 2016-02-25 ENCOUNTER — Other Ambulatory Visit: Payer: Self-pay | Admitting: Hematology and Oncology

## 2016-02-25 VITALS — BP 100/63 | HR 80 | Temp 96.2°F | Resp 18 | Wt 266.8 lb

## 2016-02-25 DIAGNOSIS — I10 Essential (primary) hypertension: Secondary | ICD-10-CM

## 2016-02-25 DIAGNOSIS — Z86718 Personal history of other venous thrombosis and embolism: Secondary | ICD-10-CM | POA: Insufficient documentation

## 2016-02-25 DIAGNOSIS — E785 Hyperlipidemia, unspecified: Secondary | ICD-10-CM | POA: Insufficient documentation

## 2016-02-25 DIAGNOSIS — C642 Malignant neoplasm of left kidney, except renal pelvis: Secondary | ICD-10-CM

## 2016-02-25 DIAGNOSIS — C7800 Secondary malignant neoplasm of unspecified lung: Secondary | ICD-10-CM | POA: Diagnosis not present

## 2016-02-25 DIAGNOSIS — F419 Anxiety disorder, unspecified: Secondary | ICD-10-CM | POA: Diagnosis not present

## 2016-02-25 DIAGNOSIS — F329 Major depressive disorder, single episode, unspecified: Secondary | ICD-10-CM | POA: Insufficient documentation

## 2016-02-25 DIAGNOSIS — Z79899 Other long term (current) drug therapy: Secondary | ICD-10-CM | POA: Diagnosis not present

## 2016-02-25 DIAGNOSIS — C7951 Secondary malignant neoplasm of bone: Secondary | ICD-10-CM

## 2016-02-25 DIAGNOSIS — Z8701 Personal history of pneumonia (recurrent): Secondary | ICD-10-CM | POA: Diagnosis not present

## 2016-02-25 DIAGNOSIS — Z85828 Personal history of other malignant neoplasm of skin: Secondary | ICD-10-CM | POA: Diagnosis not present

## 2016-02-25 DIAGNOSIS — K219 Gastro-esophageal reflux disease without esophagitis: Secondary | ICD-10-CM

## 2016-02-25 DIAGNOSIS — N1330 Unspecified hydronephrosis: Secondary | ICD-10-CM | POA: Insufficient documentation

## 2016-02-25 DIAGNOSIS — Z5112 Encounter for antineoplastic immunotherapy: Secondary | ICD-10-CM | POA: Insufficient documentation

## 2016-02-25 DIAGNOSIS — Z7901 Long term (current) use of anticoagulants: Secondary | ICD-10-CM | POA: Insufficient documentation

## 2016-02-25 DIAGNOSIS — N3289 Other specified disorders of bladder: Secondary | ICD-10-CM

## 2016-02-25 DIAGNOSIS — Z23 Encounter for immunization: Secondary | ICD-10-CM | POA: Insufficient documentation

## 2016-02-25 DIAGNOSIS — M129 Arthropathy, unspecified: Secondary | ICD-10-CM | POA: Insufficient documentation

## 2016-02-25 DIAGNOSIS — I252 Old myocardial infarction: Secondary | ICD-10-CM | POA: Insufficient documentation

## 2016-02-25 DIAGNOSIS — Z923 Personal history of irradiation: Secondary | ICD-10-CM | POA: Diagnosis not present

## 2016-02-25 DIAGNOSIS — L309 Dermatitis, unspecified: Secondary | ICD-10-CM | POA: Diagnosis not present

## 2016-02-25 LAB — CBC WITH DIFFERENTIAL/PLATELET
Basophils Absolute: 0.1 10*3/uL (ref 0–0.1)
Basophils Relative: 1 %
Eosinophils Absolute: 0.3 10*3/uL (ref 0–0.7)
Eosinophils Relative: 5 %
HCT: 28.3 % — ABNORMAL LOW (ref 40.0–52.0)
Hemoglobin: 9.2 g/dL — ABNORMAL LOW (ref 13.0–18.0)
Lymphocytes Relative: 11 %
Lymphs Abs: 0.6 10*3/uL — ABNORMAL LOW (ref 1.0–3.6)
MCH: 30.5 pg (ref 26.0–34.0)
MCHC: 32.5 g/dL (ref 32.0–36.0)
MCV: 94 fL (ref 80.0–100.0)
Monocytes Absolute: 0.8 10*3/uL (ref 0.2–1.0)
Monocytes Relative: 14 %
Neutro Abs: 3.6 10*3/uL (ref 1.4–6.5)
Neutrophils Relative %: 69 %
Platelets: 165 10*3/uL (ref 150–440)
RBC: 3.01 MIL/uL — ABNORMAL LOW (ref 4.40–5.90)
RDW: 17.7 % — ABNORMAL HIGH (ref 11.5–14.5)
WBC: 5.3 10*3/uL (ref 3.8–10.6)

## 2016-02-25 LAB — COMPREHENSIVE METABOLIC PANEL
ALT: 9 U/L — ABNORMAL LOW (ref 17–63)
AST: 14 U/L — ABNORMAL LOW (ref 15–41)
Albumin: 3.5 g/dL (ref 3.5–5.0)
Alkaline Phosphatase: 74 U/L (ref 38–126)
Anion gap: 6 (ref 5–15)
BUN: 17 mg/dL (ref 6–20)
CO2: 27 mmol/L (ref 22–32)
Calcium: 8.8 mg/dL — ABNORMAL LOW (ref 8.9–10.3)
Chloride: 106 mmol/L (ref 101–111)
Creatinine, Ser: 1.04 mg/dL (ref 0.61–1.24)
GFR calc Af Amer: >60 mL/min (ref >60)
GFR calc non Af Amer: >60 mL/min (ref >60)
Glucose, Bld: 107 mg/dL — ABNORMAL HIGH (ref 65–99)
Potassium: 4.4 mmol/L (ref 3.5–5.1)
Sodium: 139 mmol/L (ref 135–145)
Total Bilirubin: 0.6 mg/dL (ref 0.3–1.2)
Total Protein: 6.5 g/dL (ref 6.5–8.1)

## 2016-02-25 LAB — TSH: TSH: 6.239 u[IU]/mL — ABNORMAL HIGH (ref 0.350–4.500)

## 2016-02-25 LAB — MAGNESIUM: Magnesium: 2 mg/dL (ref 1.7–2.4)

## 2016-02-25 LAB — T4, FREE: Free T4: 0.79 ng/dL (ref 0.61–1.12)

## 2016-02-25 MED ORDER — SODIUM CHLORIDE 0.9 % IV SOLN
240.0000 mg | Freq: Once | INTRAVENOUS | Status: AC
  Administered 2016-02-25: 240 mg via INTRAVENOUS
  Filled 2016-02-25: qty 4

## 2016-02-25 MED ORDER — SODIUM CHLORIDE 0.9 % IV SOLN
Freq: Once | INTRAVENOUS | Status: AC
  Administered 2016-02-25: 10:00:00 via INTRAVENOUS
  Filled 2016-02-25: qty 1000

## 2016-02-25 NOTE — Progress Notes (Signed)
[facility] day:  02/25/16   Chief Complaint: Marc Blake is a 60 y.o. male with metastatic renal cell carcinoma who is seen for assessment prior to cycle #3 nivolumab.  HPI: The patient was last seen in the medical oncology clinic on 02/14/2016.  At that time, he was seen for a sick call visit.  He had a 2 day history of increased shortness of breath with exertion associated with a tingly sensation in his chest and arms similar to his presentation with a MI several years ago.  He also had a flare of his eczema.  He was transported to the ER and admitted.  He was admitted to [facility] from 02/14/2016 - 02/16/2016.  Chest CT angiogram on 02/15/2016 ruled out pulmonary embolism. Imaging revealed slight progression of mediastinal left hilar adenopathy was pulmonary metastatic disease. Stress test on 02/15/2016 ruled out cardiac ischemia.  He was treated with Augmentin for presumed lower extremity cellulitis.  His Xarelto was restarted.  During the interim, he has done well.  He saw Dr. Nehemiah Massed, cardiologist, yesterday.  The patient feels that heat "overdid it".   He is being very cautious with his activities. He notes a flare of his eczema on his elbows.   Past Medical History:  Diagnosis Date  . Acid reflux   . Anxiety   . Arthritis   . Depression   . Depression   . Hyperlipidemia   . Hypertension   . Myocardial infarction ([place])   . Renal cancer ([place])   . Renal cell carcinoma ([place])   . Skin cancer     Past Surgical History:  Procedure Laterality Date  . BACK SURGERY    . CARDIAC CATHETERIZATION     with stent  . KNEE SURGERY Left   . LEG SURGERY Left   . TONSILLECTOMY      Family History  Problem Relation Age of Onset  . Hypertension Father   . Heart attack Father   . Stroke Sister   . Hypertension Brother   . Stroke Maternal Uncle     Social History:  reports that he has never smoked. He has never used smokeless tobacco. He  reports that he does not drink alcohol or use drugs.  He is originally from [place].  He then lived in [place] for 17 years.  He moved to [place] 2 months ago with his wife.  The patient is alone today.  Allergies: No Known Allergies  Current Medications: Current Outpatient Prescriptions  Medication Sig Dispense Refill  . amLODipine (NORVASC) 2.5 MG tablet TAKE 1 TABLET ONCE A DAY FOR 30 DAYS  2  . atorvastatin (LIPITOR) 20 MG tablet TAKE 1 TABLET ONCE A DAY (AT BEDTIME) FOR 30 DAYS  2  . busPIRone (BUSPAR) 5 MG tablet TAKE 1 TABLET 3 TIMES A DAY FOR 30 DAYS  2  . carvedilol (COREG) 12.5 MG tablet TAKE 1 TABLET BY MOUTH TWICE A DAY FOR 30 DAYS  3  . gabapentin (NEURONTIN) 300 MG capsule Take 300 mg by mouth 3 (three) times daily.     Marland Kitchen ibuprofen (ADVIL,MOTRIN) 200 MG tablet Take 200 mg by mouth every 8 (eight) hours as needed for moderate pain. 4 every 8 hours    . lisinopril (PRINIVIL,ZESTRIL) 20 MG tablet TAKE 1 TABLET ONCE A DAY FOR 30 DAYS  3  . morphine (MS CONTIN) 15 MG 12 hr tablet Take 15 mg by mouth every 12 (twelve) hours.  0  .  nitrofurantoin (MACRODANTIN) 50 MG capsule TAKE 1 CAPSULE BY MOUTH AT BEDTIME FOR 30 DAYS  3  . Nivolumab (OPDIVO IV) Inject into the vein.    Marland Kitchen oxyCODONE (OXY IR/ROXICODONE) 5 MG immediate release tablet Take 5 mg by mouth every 8 (eight) hours as needed (patient takes 2 tabs in the evening only).     Marland Kitchen senna (SENOKOT) 8.6 MG tablet Take 2 tablets by mouth at bedtime.     . sertraline (ZOLOFT) 100 MG tablet Take 100 mg by mouth at bedtime.  1  . tamsulosin (FLOMAX) 0.4 MG CAPS capsule Take 0.8 mg by mouth at bedtime.    . traZODone (DESYREL) 50 MG tablet Take 50 mg by mouth at bedtime.   3  . XARELTO 20 MG TABS tablet Take 20 mg by mouth daily.  1   No current facility-administered medications for this visit.     Review of Systems:  GENERAL:  Feels "fine".  No fevers or sweats.  Weight down 4 pounds. PERFORMANCE STATUS (ECOG):  1 HEENT:  No  visual changes, runny nose, sore throat, mouth sores or tenderness. Lungs:  No shortness of breath, cough or wheezing.  Resolution of tingly sensation in chest laterally.  No hemoptysis. Cardiac:  No chest pain, palpitations, orthopnea, or PND. GI:  No nausea, vomiting, constipation, melena or hematochezia. GU:  Bladder emptying issues.  Procedure with Dr. Erlene Quan postponed.  No urgency, frequency, dysuria, or hematuria. Musculoskeletal:  No back pain.  No joint pain.  No muscle tenderness. Extremities:  Arms numb and tingly with exertion.  No change in chronic lower extremity swelling. Skin:  Eczema flare on elbows. Neuro:  No headache, numbness or weakness.  Decreased feeling left leg.  Slight balance problem. Endocrine:  No diabetes, thyroid issues, hot flashes or night sweats. Psych:  No mood changes, depression or anxiety. Pain:  No focal pain. Review of systems:  All other systems reviewed and found to be negative.  Physical Exam: Blood pressure 100/63, pulse 80, temperature (!) 96.2 F (35.7 C), temperature source Tympanic, resp. rate 18, weight 266 lb 12.1 oz (121 kg). GENERAL:  Well developed, well nourished, gentleman sitting comfortably in the exam room in no acute distress.  He has a cane at his side. MENTAL STATUS:  Alert and oriented to person, place and time. HEAD:  Long gray hair with goatee.  Normocephalic, atraumatic, face symmetric, no Cushingoid features. EYES:  Pupils equal round and reactive to light and accomodation.  No conjunctivitis or scleral icterus. ENT:  Oropharynx clear without lesion.  Poor dentition.  Tongue normal. Mucous membranes moist.  RESPIRATORY:  Clear to auscultation without rales, wheezes or rhonchi. CARDIOVASCULAR:  Regular rate and rhythm without murmur, rub or gallop.  No JVD. ABDOMEN:  Soft, non-tender, with active bowel sounds, and no hepatosplenomegaly.  No masses. SKIN:  Tattoos.  Mild facial erythema.  Eczema on dorsal surface of forearms.   Patchy eczema right flask (stable).  EXTREMITIES: Chronic dense lower extremity edema (left > right).  No skin discoloration or tenderness.  No palpable cords. LYMPH NODES: No palpable cervical, supraclavicular, axillary or inguinal adenopathy  NEUROLOGICAL:  Stable.  No change in ambulation. PSYCH:  Appropriate.  .  Orders Only on 02/25/2016  Component Date Value Ref Range Status  . WBC 02/25/2016 5.3  3.8 - 10.6 K/uL Final  . RBC 02/25/2016 3.01* 4.40 - 5.90 MIL/uL Final  . Hemoglobin 02/25/2016 9.2* 13.0 - 18.0 g/dL Final  . HCT 02/25/2016 28.3* 40.0 -  52.0 % Final  . MCV 02/25/2016 94.0  80.0 - 100.0 fL Final  . MCH 02/25/2016 30.5  26.0 - 34.0 pg Final  . MCHC 02/25/2016 32.5  32.0 - 36.0 g/dL Final  . RDW 02/25/2016 17.7* 11.5 - 14.5 % Final  . Platelets 02/25/2016 165  150 - 440 K/uL Final  . Neutrophils Relative % 02/25/2016 69  % Final  . Neutro Abs 02/25/2016 3.6  1.4 - 6.5 K/uL Final  . Lymphocytes Relative 02/25/2016 11  % Final  . Lymphs Abs 02/25/2016 0.6* 1.0 - 3.6 K/uL Final  . Monocytes Relative 02/25/2016 14  % Final  . Monocytes Absolute 02/25/2016 0.8  0.2 - 1.0 K/uL Final  . Eosinophils Relative 02/25/2016 5  % Final  . Eosinophils Absolute 02/25/2016 0.3  0 - 0.7 K/uL Final  . Basophils Relative 02/25/2016 1  % Final  . Basophils Absolute 02/25/2016 0.1  0 - 0.1 K/uL Final    Assessment:  Marc Blake is a 60 y.o. male with metastatic renal cell carcinoma presenting in 12/2013.  He had a large left renal mass, rib and vertebral body involvement, and multiple pulmonary nodules.  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    He was on Votrient (pazopanib) from 02/13/2014 - 01/21/2016.  He began nivolumab on 01/28/2016.  Abdominal and pelvic CT scan on 01/12/2014 revealed an 8.5 x 10.2 x 9.2 cm irregularly enhancing mass of the left kidney with some internal calcifications. There were enlarged left sided renal  hilar lymph nodes. There were multiple pulmonary nodules in both lower lobes (largest 1.1 cm). There was a destructive bone lesion with an irregular 8.6 x 5.1 cm soft tissue mass invasive of the spinal canal narrowing at least 50% and disturbing the pedicle and portions of the left transverse process rib and T8 vertebral body.   Chest CT on 01/15/2014 revealed innumerable pulmonary nodules, a large metastatic lesion at T8 and left eighth rib with invasion into the spinal canal and moderate canal stenosis. There was a left upper pole right kidney mass.  Head MRI was negative.  He received 3000 cGy to T7 - T9 and associated ribs beginning 05/11/2014.  On 06/15/2014, he received 1 dose of nivolumab.  He presented with progressive paralysis from the waist down on 07/06/2014.  He underwent left posterolateral thoracotomy with left chest wall resection with T8 corpectomy and interbody fusion with donor bone on 07/06/2014.  Pathology revealed metastatic renal cell carcinoma. Post-operatively, he developed sepsis secondary to a submandibular abscess dental abscess.  He underwent tracheostomy, I&D of a right neck abscess, tooth extraction, and PEG tube placement.  Zometa has subsequently been on hold.  He developed a pathologic fracture of the left femur.  He is s/p intramedullary nailing on 01/27/2015.  He received radiation to the left leg.  He was admitted on 04/26/2015 with aspiration pneumonia and opioid overdose. He is currently taking MSContin 15 mg BID and oxycodone 5 mg q day.  Chest, abdomen, and pelvic CT scan on 05/31/2015 revealed slight enlargement of multiple pulmonary nodules.  The left renal mass was similar (12.2 x 9.6 cm).  The right kidney lesion was stable (lower pole lesion 2 x 1.7 cm).  Bone scan on 05/31/2015 revealed slight increased activity in the left costal vertebral junction at T7.  Chest, abdomen, and pelvic CT scan on 12/14/2015 revealed an irregular 10.7 cm heterogeneous renal  cortical mass in the upper left kidney  and a heterogeneous 2.3 cm renal cortical mass in the lateral lower right kidney.  There was mild bilateral hydroureteronephrosis with bladder distention.  There was mild left para-aortic lymphadenopathy.  There were numerous (greater than 20) pulmonary metastases.  Largest nodule was 2.1 cm in the RLL. There was a partially visualized mildly expansile lytic lesion in the left proximal femur status post surgical transfixation.  Thre was a small probably loculated pleural effusion at the left eighth rib resection site.  There were no other bone abnormalities.  Bone scan on 12/23/2015 revealed photopenia from prior removal of the left eighth rib. There was increased  uptake in the medial thoracic spine from T7 and T9 is felt to be due to postoperative fusion.  There was increased uptake throughout much of the left femur as well as increased uptake in the soft tissues of much of the left femur which may be due to previous surgery and radiation therapy change.  He developed a left lower extremity DVT (popliteal and soleal vein) on 06/07/2015.  Left lower extremity ultrasound on 02/07/2016 revealed no evidence of DVT.  He is on long term Xarelto.  Patient has a long standing history of an obstructive uropathy and elevated PVR.  He has undergone urethral dilatation on several occasions.  He is s/p 2 cycles of nivolumab (01/28/2016 - 02/11/2016).  He tolerated his infusions well.  TSH was 5.946 (0.35-4.50) with a normal free T4 (0.68) on 01/21/2016.  He was admitted to [facility] from 02/14/2016 - 02/16/2016 with chest pain.  Chest CT angiogram on 02/15/2016 ruled out pulmonary embolism. Imaging revealed slight progression of mediastinal left hilar adenopathy was pulmonary metastatic disease. Stress test on 02/15/2016 ruled out cardiac ischemia.  He was treated with Augmentin for presumed lower extremity cellulitis.  Symptomatically, he is back to baseline.  His eczema is more  active likely due to nivolumab.  Plan: 1.  Labs today:  CBC with diff, CMP, TSH, T4. 2.  Review events of recent hospitalization. 3.  Cycle #3 nivolumab. 4.  Discuss plan for topical steroids for eczema.  Patient to notify clinic if worsens. 5.  RTC in 2 weeks for labs (CBC with diff, CMP, Mg), and cycle #4 nivolumab.   [name], MD  02/25/2016 , 8:44 AM

## 2016-02-27 ENCOUNTER — Encounter: Payer: Self-pay | Admitting: Hematology and Oncology

## 2016-02-27 DIAGNOSIS — L309 Dermatitis, unspecified: Secondary | ICD-10-CM | POA: Insufficient documentation

## 2016-02-28 NOTE — Telephone Encounter (Signed)
Notified pt of surgery rescheduled to 03/06/16 & to call Friday prior to surgery for arrival time to SDS. Advised pt to hold Xarelto 3 days prior to surgery, beginning on 03/03/16, per Dr Nehemiah Massed. Advised pt to follow instructions given in pre-admit testing including nothing to eat or drink after mn. Pt voices understanding.

## 2016-03-01 ENCOUNTER — Other Ambulatory Visit: Payer: Self-pay | Admitting: Pain Medicine

## 2016-03-02 ENCOUNTER — Ambulatory Visit: Payer: 59 | Admitting: Pain Medicine

## 2016-03-06 ENCOUNTER — Ambulatory Visit: Payer: 59 | Admitting: Certified Registered Nurse Anesthetist

## 2016-03-06 ENCOUNTER — Encounter
Admission: RE | Disposition: A | Discharge status: Home / Self Care | Payer: Self-pay | Source: Ambulatory Visit | Attending: Urology

## 2016-03-06 ENCOUNTER — Ambulatory Visit
Admission: RE | Admit: 2016-03-06 | Discharge: 2016-03-06 | Disposition: A | Discharge status: Home / Self Care | Payer: 59 | Source: Ambulatory Visit | Attending: Urology | Admitting: Urology

## 2016-03-06 DIAGNOSIS — C7951 Secondary malignant neoplasm of bone: Secondary | ICD-10-CM | POA: Insufficient documentation

## 2016-03-06 DIAGNOSIS — C7800 Secondary malignant neoplasm of unspecified lung: Secondary | ICD-10-CM | POA: Insufficient documentation

## 2016-03-06 DIAGNOSIS — Z79899 Other long term (current) drug therapy: Secondary | ICD-10-CM | POA: Insufficient documentation

## 2016-03-06 DIAGNOSIS — F419 Anxiety disorder, unspecified: Secondary | ICD-10-CM | POA: Diagnosis not present

## 2016-03-06 DIAGNOSIS — N401 Enlarged prostate with lower urinary tract symptoms: Secondary | ICD-10-CM | POA: Diagnosis not present

## 2016-03-06 DIAGNOSIS — M1990 Unspecified osteoarthritis, unspecified site: Secondary | ICD-10-CM | POA: Insufficient documentation

## 2016-03-06 DIAGNOSIS — I110 Hypertensive heart disease with heart failure: Secondary | ICD-10-CM | POA: Insufficient documentation

## 2016-03-06 DIAGNOSIS — I739 Peripheral vascular disease, unspecified: Secondary | ICD-10-CM | POA: Diagnosis not present

## 2016-03-06 DIAGNOSIS — N99111 Postprocedural bulbous urethral stricture: Secondary | ICD-10-CM

## 2016-03-06 DIAGNOSIS — R339 Retention of urine, unspecified: Secondary | ICD-10-CM | POA: Insufficient documentation

## 2016-03-06 DIAGNOSIS — Z923 Personal history of irradiation: Secondary | ICD-10-CM | POA: Insufficient documentation

## 2016-03-06 DIAGNOSIS — I252 Old myocardial infarction: Secondary | ICD-10-CM | POA: Insufficient documentation

## 2016-03-06 DIAGNOSIS — Z8249 Family history of ischemic heart disease and other diseases of the circulatory system: Secondary | ICD-10-CM | POA: Insufficient documentation

## 2016-03-06 DIAGNOSIS — I509 Heart failure, unspecified: Secondary | ICD-10-CM | POA: Insufficient documentation

## 2016-03-06 DIAGNOSIS — Z823 Family history of stroke: Secondary | ICD-10-CM | POA: Diagnosis not present

## 2016-03-06 DIAGNOSIS — N359 Urethral stricture, unspecified: Secondary | ICD-10-CM | POA: Diagnosis present

## 2016-03-06 DIAGNOSIS — C649 Malignant neoplasm of unspecified kidney, except renal pelvis: Secondary | ICD-10-CM | POA: Diagnosis not present

## 2016-03-06 DIAGNOSIS — Z87440 Personal history of urinary (tract) infections: Secondary | ICD-10-CM | POA: Insufficient documentation

## 2016-03-06 DIAGNOSIS — Z7901 Long term (current) use of anticoagulants: Secondary | ICD-10-CM | POA: Diagnosis not present

## 2016-03-06 DIAGNOSIS — F329 Major depressive disorder, single episode, unspecified: Secondary | ICD-10-CM | POA: Insufficient documentation

## 2016-03-06 DIAGNOSIS — Z86718 Personal history of other venous thrombosis and embolism: Secondary | ICD-10-CM | POA: Insufficient documentation

## 2016-03-06 DIAGNOSIS — N3090 Cystitis, unspecified without hematuria: Secondary | ICD-10-CM | POA: Diagnosis not present

## 2016-03-06 HISTORY — PX: CYSTOSCOPY WITH URETHRAL DILATATION: SHX5125

## 2016-03-06 HISTORY — PX: CYSTOSCOPY WITH BIOPSY: SHX5122

## 2016-03-06 LAB — URINE DRUG SCREEN, QUALITATIVE (ARMC ONLY)
AMPHETAMINES, UR SCREEN: NOT DETECTED
Barbiturates, Ur Screen: NOT DETECTED
Benzodiazepine, Ur Scrn: NOT DETECTED
COCAINE METABOLITE, UR ~~LOC~~: NOT DETECTED
Cannabinoid 50 Ng, Ur ~~LOC~~: NOT DETECTED
MDMA (ECSTASY) UR SCREEN: NOT DETECTED
METHADONE SCREEN, URINE: NOT DETECTED
OPIATE, UR SCREEN: POSITIVE — AB
PHENCYCLIDINE (PCP) UR S: NOT DETECTED
Tricyclic, Ur Screen: NOT DETECTED

## 2016-03-06 SURGERY — CYSTOSCOPY, WITH URETHRAL DILATION
Anesthesia: General | Site: Bladder | Wound class: Clean Contaminated

## 2016-03-06 MED ORDER — FAMOTIDINE 20 MG PO TABS
ORAL_TABLET | ORAL | Status: AC
  Administered 2016-03-06: 20 mg via ORAL
  Filled 2016-03-06: qty 1

## 2016-03-06 MED ORDER — LACTATED RINGERS IV SOLN
INTRAVENOUS | Status: DC
  Administered 2016-03-06: 14:00:00 via INTRAVENOUS

## 2016-03-06 MED ORDER — FENTANYL CITRATE (PF) 100 MCG/2ML IJ SOLN
25.0000 ug | INTRAMUSCULAR | Status: DC | PRN
Start: 2016-03-06 — End: 2016-03-06

## 2016-03-06 MED ORDER — OXYCODONE HCL 5 MG PO TABS: 5.0000 mg | ORAL_TABLET | ORAL | 0 refills | Status: DC | PRN

## 2016-03-06 MED ORDER — FAMOTIDINE 20 MG PO TABS
20.0000 mg | ORAL_TABLET | Freq: Once | ORAL | Status: AC
  Administered 2016-03-06: 20 mg via ORAL

## 2016-03-06 MED ORDER — LIDOCAINE HCL (CARDIAC) 20 MG/ML IV SOLN
INTRAVENOUS | Status: DC | PRN
  Administered 2016-03-06: 100 mg via INTRAVENOUS

## 2016-03-06 MED ORDER — GLYCOPYRROLATE 0.2 MG/ML IJ SOLN
INTRAMUSCULAR | Status: DC | PRN
  Administered 2016-03-06: 0.2 mg via INTRAVENOUS

## 2016-03-06 MED ORDER — SULFAMETHOXAZOLE-TRIMETHOPRIM 400-80 MG PO TABS: 1.0000 | ORAL_TABLET | Freq: Two times a day (BID) | ORAL | 0 refills | Status: DC

## 2016-03-06 MED ORDER — KETAMINE HCL 50 MG/ML IJ SOLN
INTRAMUSCULAR | Status: DC | PRN
  Administered 2016-03-06: 25 mg via INTRAVENOUS

## 2016-03-06 MED ORDER — PROPOFOL 10 MG/ML IV BOLUS
INTRAVENOUS | Status: DC | PRN
  Administered 2016-03-06: 130 mg via INTRAVENOUS

## 2016-03-06 MED ORDER — FENTANYL CITRATE (PF) 100 MCG/2ML IJ SOLN
INTRAMUSCULAR | Status: DC | PRN
  Administered 2016-03-06: 100 ug via INTRAVENOUS

## 2016-03-06 MED ORDER — GENTAMICIN IN SALINE 1.6-0.9 MG/ML-% IV SOLN
80.0000 mg | INTRAVENOUS | Status: AC
  Administered 2016-03-06: 80 mg via INTRAVENOUS
  Filled 2016-03-06: qty 50

## 2016-03-06 MED ORDER — EPHEDRINE SULFATE 50 MG/ML IJ SOLN
INTRAMUSCULAR | Status: DC | PRN
  Administered 2016-03-06: 5 mg via INTRAVENOUS

## 2016-03-06 MED ORDER — OXYBUTYNIN CHLORIDE 5 MG PO TABS: 5.0000 mg | ORAL_TABLET | Freq: Three times a day (TID) | ORAL | 0 refills | Status: DC | PRN

## 2016-03-06 MED ORDER — FLUCONAZOLE 100 MG PO TABS: 100.0000 mg | ORAL_TABLET | Freq: Every day | ORAL | 0 refills | Status: DC

## 2016-03-06 MED ORDER — MIDAZOLAM HCL 2 MG/2ML IJ SOLN
INTRAMUSCULAR | Status: DC | PRN
  Administered 2016-03-06: 1 mg via INTRAVENOUS

## 2016-03-06 MED ORDER — ONDANSETRON HCL 4 MG/2ML IJ SOLN: 4.0000 mg | Freq: Once | INTRAMUSCULAR | Status: DC | PRN

## 2016-03-06 SURGICAL SUPPLY — 20 items
CATH FOL 2WAY LX 16X5 (CATHETERS) IMPLANT
CATH FOLEY 2W COUNCIL 5CC 18FR (CATHETERS) ×3 IMPLANT
CATH URETHRAL DIL 7.0X29 (CATHETERS) ×3 IMPLANT
DRESSING TELFA 4X3 1S ST N-ADH (GAUZE/BANDAGES/DRESSINGS) ×3 IMPLANT
ELECT REM PT RETURN 9FT ADLT (ELECTROSURGICAL)
ELECTRODE REM PT RTRN 9FT ADLT (ELECTROSURGICAL) IMPLANT
GLOVE BIO SURGEON STRL SZ 6.5 (GLOVE) ×3 IMPLANT
GLOVE BIO SURGEON STRL SZ7 (GLOVE) ×6 IMPLANT
GOWN STRL REUS W/ TWL LRG LVL3 (GOWN DISPOSABLE) ×4 IMPLANT
GOWN STRL REUS W/TWL LRG LVL3 (GOWN DISPOSABLE) ×2
GUIDEWIRE SUPER STIFF (WIRE) ×3 IMPLANT
NDL SAFETY ECLIPSE 18X1.5 (NEEDLE) ×2 IMPLANT
NEEDLE HYPO 18GX1.5 SHARP (NEEDLE) ×1
PACK CYSTO AR (MISCELLANEOUS) ×3 IMPLANT
PREP PVP WINGED SPONGE (MISCELLANEOUS) IMPLANT
SET CYSTO W/LG BORE CLAMP LF (SET/KITS/TRAYS/PACK) ×3 IMPLANT
SYR 30ML LL (SYRINGE) ×3 IMPLANT
SYRINGE 10CC LL (SYRINGE) ×3 IMPLANT
WATER STERILE IRR 1000ML POUR (IV SOLUTION) ×3 IMPLANT
WATER STERILE IRR 3000ML UROMA (IV SOLUTION) ×3 IMPLANT

## 2016-03-06 NOTE — H&P (Signed)
02/01/2016  --> updated 03/06/16.  In the interim, history of the was admitted for shortness of breath while off of his Xarelto. Start his workup, he underwent CT PA to rule out PE as well as a stress test which was negative for ischemia. He is returned back to baseline. He had cardiac clearance to hold his Xarelto 3 days for this procedure.  RRR. CTAB.   8:30 AM   KYEL PURK [birth year] [id number]  Referring provider: [name], MD [facility] [place], [place] [address]  No chief complaint on file.   HPI:  1 - Metastatic Renal Cell Carcinoma - initial DX 2015 in [place] with large left 12cm and right 3cm masses as well as multifocal pulmonary and bone metastasis. Votrient (TKI) initially, then transitioned to Nivolomab (PD-1 inhibitor) 12/2015 for progression. Has had spine radiation and surgery for cord compression.  Recent sumarized Course -  12/2015 - CT approx stable left and right renal masses, lung and bone mets --> nivolomab per cancer center Dr. [name].   2 - Prostatic Hypertrophy - s/p TURP 2005 and 2010 previously in [place].  3 - Urethral Stricture - s/p dilation most recently 2015 in [place]. Recurrence of what appears to be high grade but short segments bulbar stricture by cysto 01/2016.   4 - Incomplete Bladder Emptying - very large bladder distension noted on CT 2017 and PVR's 942m range. No recent cysto of bladder funcitonal studies. He has h/o spine surgery / compression (possible neurogenic course) as well and obstruction as per above. Cr <1.0 and no hydro 2017.  PMH for DVT / xarelto (has come off for procedures, no DVT at present), GERD.   Today " RXavi" is seen as new patient for above. [facility] [place] after his kids all out of the house.  Cysto today with bulbar sricture.    PMH:     Past Medical History:  Diagnosis Date  . Anxiety   . Depression   . Hypertension   .  [place]   . Renal cell carcinoma ([facility]     Surgical History:      Past Surgical History:  Procedure Laterality Date  . BACK SURGERY    . KNEE SURGERY Left   . LEG SURGERY Left     Home Medications:        Medication List           Accurate as of 02/01/16  8:30 AM. Always use your most recent med list.           amLODipine 2.5 MG tablet Commonly known as:  NORVASC TAKE 1 TABLET ONCE A DAY FOR 30 DAYS  atorvastatin 20 MG tablet Commonly known as:  LIPITOR TAKE 1 TABLET ONCE A DAY (AT BEDTIME) FOR 30 DAYS  busPIRone 5 MG tablet Commonly known as:  BUSPAR TAKE 1 TABLET 3 TIMES A DAY FOR 30 DAYS  carvedilol 12.5 MG tablet Commonly known as:  COREG TAKE 1 TABLET BY MOUTH TWICE A DAY FOR 30 DAYS  gabapentin 300 MG capsule Commonly known as:  NEURONTIN Take by mouth.  lisinopril 20 MG tablet Commonly known as:  PRINIVIL,ZESTRIL TAKE 1 TABLET ONCE A DAY FOR 30 DAYS  morphine 15 MG 12 hr tablet Commonly known as:  MS CONTIN Take 15 mg by mouth every 12 (twelve) hours.  nitrofurantoin 50 MG capsule Commonly known as:  MACRODANTIN TAKE 1 CAPSULE BY MOUTH AT  BEDTIME FOR 30 DAYS  oxyCODONE 5 MG immediate release tablet Commonly known as:  Oxy IR/ROXICODONE Take by mouth.  senna 8.6 MG tablet Commonly known as:  SENOKOT Take by mouth.  sertraline 100 MG tablet Commonly known as:  ZOLOFT Take 100 mg by mouth at bedtime.  tamsulosin 0.4 MG Caps capsule Commonly known as:  FLOMAX TAKE 2 CAPSULES ONCE A DAY FOR 30 DAY(S)  traZODone 50 MG tablet Commonly known as:  DESYREL Take 25 mg by mouth daily.  XARELTO 20 MG Tabs tablet Generic drug:  rivaroxaban Take 20 mg by mouth daily.      Allergies: No Known Allergies  Family History:      Family History  Problem Relation Age of Onset  . Hypertension Father   . Heart attack Father   . Stroke Sister   . Hypertension Brother   . Stroke Maternal Uncle     Social  History:  reports that he has never smoked. He has never used smokeless tobacco. He reports that he does not drink alcohol or use drugs.    Review of Systems  Gastrointestinal (upper)  : Negative for upper GI symptoms  Gastrointestinal (lower) : Negative for lower GI symptoms  Constitutional : Negative for symptoms  Skin: Negative for skin symptoms  Eyes: Negative for eye symptoms  Ear/Nose/Throat : Negative for Ear/Nose/Throat symptoms  Hematologic/Lymphatic: Negative for Hematologic/Lymphatic symptoms  Cardiovascular : Negative for cardiovascular symptoms  Respiratory : Negative for respiratory symptoms  Endocrine: Negative for endocrine symptoms  Musculoskeletal: Negative for musculoskeletal symptoms  Neurological: Negative for neurological symptoms  Psychologic: Negative for psychiatric symptoms     Physical Exam: There were no vitals taken for this visit.  Constitutional:  Alert and oriented, No acute distress. Uses Cane. Appears older than stated age.  HEENT: [place] AT, moist mucus membranes.  Trachea midline, no masses. Cardiovascular: No clubbing, cyanosis, or edema. Respiratory: Normal respiratory effort, no increased work of breathing. GI: Abdomen is soft, nontender, nondistended, no abdominal masses GU: No CVA tenderness. DRE 60gm smooth.  Skin: No rashes, bruises or suspicious lesions. Lymph: No cervical or inguinal adenopathy. Neurologic: Grossly intact, no focal deficits, moving all 4 extremities. Psychiatric: Normal mood and affect.  Laboratory Data: RecentLabs       Lab Results  Component Value Date   WBC 4.1 01/28/2016   HGB 11.2 (L) 01/28/2016   HCT 34.5 (L) 01/28/2016   MCV 94.0 01/28/2016   PLT 136 (L) 01/28/2016      RecentLabs       Lab Results  Component Value Date   CREATININE 1.09 01/28/2016        Cystoscopy Procedure Note  Patient identification was confirmed, informed consent  was obtained, and patient was prepped using Betadine solution.  Lidocaine jelly was administered per urethral meatus.    Preoperative abx where received prior to procedure.     Pre-Procedure: - Inspection reveals a normal caliber ureteral meatus.  Procedure: The flexible cystoscope was introduced without difficulty - Dense bulbar stiricture noted, appears short segment, estimate 64F.    Post-Procedure: - Patient tolerated the procedure well    Pertinent Imaging: As per above  Assessment & Plan:    1 - Metastatic Renal Cell Carcinoma - he has good understanding in incurable nature of disease. No role for renal surgery as widespread metastatic disease. Agree with second line therapy per cancer center.    2 - Prostatic Hypertrophy - unclear if remains obstructing as could not visualize proximal to  bulbar urethra by cysto today.   3 - Urethral Stricture - recurent bulbar SX. Rec operative cysto / dilation, hold Xarelto 5-7 days prior as he has done before. UCX today. Bactrim x 5 days now and to start 3 days prior.   Risks, benefits, alternatives, peri-op course (day surgery, home with foley, trial of void in few days in office).  4 - Incomplete Bladder Emptying - unclear etiology. May be obstructive or neurogenic or both. Fortunatley GFR preserved at this point, but could certainly become compromised if this persists long term. Proceed with stricture dilation.      No Follow-up on file.  Alexis Frock, [facility] [address] [place], [place] [address]

## 2016-03-06 NOTE — Anesthesia Preprocedure Evaluation (Signed)
Anesthesia Evaluation  Patient identified by MRN, date of birth, ID band Patient awake    Reviewed: Allergy & Precautions, NPO status , Patient's Chart, lab work & pertinent test results  History of Anesthesia Complications Negative for: history of anesthetic complications  Airway Mallampati: I       Dental  (+) Poor Dentition, Chipped   Pulmonary shortness of breath,           Cardiovascular hypertension, Pt. on medications and Pt. on home beta blockers + CAD, + Past MI, + Peripheral Vascular Disease and +CHF       Neuro/Psych Anxiety Depression    GI/Hepatic Neg liver ROS, GERD  Medicated and Controlled,  Endo/Other  negative endocrine ROS  Renal/GU Renal disease     Musculoskeletal  (+) Arthritis , Osteoarthritis,    Abdominal   Peds  Hematology   Anesthesia Other Findings   Reproductive/Obstetrics                            Anesthesia Physical Anesthesia Plan  ASA: III  Anesthesia Plan: General   Post-op Pain Management:    Induction: Intravenous  Airway Management Planned: LMA  Additional Equipment:   Intra-op Plan:   Post-operative Plan:   Informed Consent: I have reviewed the patients History and Physical, chart, labs and discussed the procedure including the risks, benefits and alternatives for the proposed anesthesia with the patient or authorized representative who has indicated his/her understanding and acceptance.     Plan Discussed with:   Anesthesia Plan Comments:         Anesthesia Quick Evaluation

## 2016-03-06 NOTE — Op Note (Signed)
Date of procedure: 03/06/16  Preoperative diagnosis:  1. Bulbar urethral stricture 2. Recurrent urinary tract infections 3. Incomplete bladder emptying 4. Metastatic RCC   Postoperative diagnosis:  1. Same as above 2. Bladder erythema   Procedure: 1. Cystoscopy 2. Balloon urethral dilation 3. Bladder biopsy 4. Foley catheter placement  Surgeon: [name], MD  Anesthesia: General  Complications: None  Intraoperative findings: Approximate 5 French bulbar urethral stricture, relatively short and soft. Diffusely erythematous bladder with bolus appearance with a few whitish plaques which appeared to be squamous metaplasia near the trigone.  EBL: Minimal  Specimens: Bladder biopsy 1  Drains: 64 French Foley catheter, council tip  Indication: Marc Blake is a 60 y.o. patient with with metastatic RCC and a recurrent bulbar urethral stricture.  After reviewing the management options for treatment, he elected to proceed with the above surgical procedure(s). We have discussed the potential benefits and risks of the procedure, side effects of the proposed treatment, the likelihood of the patient achieving the goals of the procedure, and any potential problems that might occur during the procedure or recuperation. Informed consent has been obtained.  Description of procedure:  The patient was taken to the operating room and allowed to place himself in the dorsal lithotomy position prior to being placed under general anesthesia.  He was then prepped and draped in the usual sterile fashion, and preoperative antibiotics were administered.  Of note, he did have an erythematous rash involving his angle folds. A preoperative time-out was performed.   A rigid 21 [place] scope was advanced per urethra. The bulbar urethra, a narrow proximal a 5 French urethral stricture was identified which appeared relatively short and not particularly dense. A Super Stiff wire was able to be placed  through this strictured area which progressed easily into the bladder. At this point time, a Cook urethral dilator was used and advanced over the wire to traverse the area of narrowing. The balloon was then filled with 8 cc of sterile water. This is lodged the same position for several minutes in order to dilate the bulbar stricture. The wire was then left in place and a 21 French cystoscope was able to be advanced alongside the wire into the bladder. Of note, the prostate was noted to have bilobar coaptation and a mildly elevated bladder neck. The bladder was externally debris-filled. A urine culture was sent. The bladder was irrigated several times to allow for better visualization. This revealed a heavily trabeculated bladder with a diverticulum adjacent to the right UO. There is also some patchy adherent plaque near the trigone which likely represented squamous metaplasia. Additionally, there is diffuse patchy erythema with a bullous-type appearance suggestive of chronic cystitis versus question of metastatic disease in the bladder. A biopsy of a posterior bladder wall in the area of erythema and bullous change was biopsied using cold cup biopsy forceps. The area was then fulgurated using Bugbee electrocautery. Hemostasis was excellent. The scope was removed. An 52 [place] council catheter was advanced over the wire into the bladder. The wire was withdrawn and the balloon was filled with 10 cc of sterile water. The bladder was allowed to drain. The urine remained clear. The patient was then cleaned and dried, repositioned supine position, reversed from anesthesia, and taken to the PACU in stable condition.  Plan: Patient will follow-up next week for Foley catheter removal and CIC teaching. I would like him to catheterize with a 49 French at least once daily to keep his bulbar urethral stricture  from recurring. We'll call him with his pathology results.  [name], M.D.

## 2016-03-06 NOTE — Discharge Instructions (Signed)
Urethrotomy, Male, Care After Refer to this sheet in the next few weeks. These instructions provide you with information on caring for yourself after your procedure. Your health care provider may also give you more specific instructions. Your treatment has been planned according to current medical practices, but problems sometimes occur. Call your health care provider if you have any problems or questions after your procedure. WHAT TO EXPECT AFTER THE PROCEDURE  After your procedure, it is typical to have the following:   Pain.  Burning pain when passing urine.  A small amount of blood in your urine.  Bloody urine leaking from around your catheter. HOME CARE INSTRUCTIONS Follow all your health care provider's home care instructions carefully. These may include:  Take all medicines as directed by your health care provider.  Follow catheter care as prescribed by your surgeon.  You may have a gauze pad on the tip of your penis. Change this pad often to keep it clean and dry. You can take a shower with your catheter in place.  Do not bathe, swim, or use a hot tub until after your catheter is removed.  Return to your health care provider as directed to have your catheter removed.  If you have to do self-catheterization after your catheter is taken out, make sure you understand the procedure completely. Follow your instructions carefully. Ask your health care provider if you have any questions.  You may eat what you usually do.  Drink enough fluid to keep your urine clear or pale yellow.  Take at least two 10-minute walks each day.  Do not lift anything heavier than 10 lb (4.5 kg) until directed by your health care provider.  Ask your health care provider when you can go back to work and do all your usual activities, including sex.  Schedule and attend follow-up visits as directed by your health care provider. It is important to keep all your appointments. SEEK MEDICAL CARE IF:  You  have chills.  You have a fever.  Your pain is not relieved by medicine.  You continue to have blood in your urine longer than expected by your health care provider. SEEK IMMEDIATE MEDICAL CARE IF:  Your pain gets worse and is not relieved by medicine.  You have heavy bleeding or clots in your urine.  You have trouble passing urine after your catheter is removed.  You have chest pain or trouble breathing.   This information is not intended to replace advice given to you by your health care provider. Make sure you discuss any questions you have with your health care provider.   Document Released: 06/10/2013 Document Revised: 10/20/2014 Document Reviewed: 06/10/2013 Elsevier Interactive Patient Education 2016 [place]   1) The drugs that you were given will stay in your system until tomorrow so for the next 24 hours you should not:  A) Drive an automobile B) Make any legal decisions C) Drink any alcoholic beverage   2) You may resume regular meals tomorrow.  Today it is better to start with liquids and gradually work up to solid foods.  You may eat anything you prefer, but it is better to start with liquids, then soup and crackers, and gradually work up to solid foods.   3) Please notify your doctor immediately if you have any unusual bleeding, trouble breathing, redness and pain at the surgery site, drainage, fever, or pain not relieved by medication.    4) Additional Instructions:  Please contact your physician with any problems or Same Day Surgery at [phone], Monday through Friday 6 am to 4 pm, or [place] [facility] [place] Main number at [phone]. ° °

## 2016-03-06 NOTE — Transfer of Care (Signed)
Immediate Anesthesia Transfer of Care Note  Patient: Marc Blake  Procedure(s) Performed: Procedure(s): CYSTOSCOPY WITH URETHRAL DILATATION WITH CATHETER PLACEMENT (N/A)  Patient Location: PACU  Anesthesia Type:General  Level of Consciousness: awake, alert , oriented and patient cooperative  Airway & Oxygen Therapy: Patient Spontanous Breathing and Patient connected to face mask oxygen  Post-op Assessment: Report given to RN and Post -op Vital signs reviewed and stable  Post vital signs: Reviewed and stable  Last Vitals:  Vitals:   03/06/16 1332 03/06/16 1509  BP: (!) 149/78 120/75  Pulse: 67 64  Resp: 20 13  Temp: 36.8 C 36.3 C    Last Pain:  Vitals:   03/06/16 1332  TempSrc: Oral         Complications: No apparent anesthesia complications

## 2016-03-06 NOTE — Anesthesia Procedure Notes (Signed)
Procedure Name: LMA Insertion Date/Time: 03/06/2016 2:37 PM Performed by: Rosaria Ferries, Danely Bayliss Pre-anesthesia Checklist: Patient identified, Emergency Drugs available and Suction available Patient Re-evaluated:Patient Re-evaluated prior to inductionOxygen Delivery Method: Circle system utilized Preoxygenation: Pre-oxygenation with 100% oxygen Intubation Type: IV induction LMA Size: 5.0 Tube secured with: Tape Dental Injury: Teeth and Oropharynx as per pre-operative assessment

## 2016-03-07 ENCOUNTER — Encounter: Payer: Self-pay | Admitting: Urology

## 2016-03-07 LAB — URINE CULTURE: Culture: NO GROWTH

## 2016-03-07 NOTE — Anesthesia Postprocedure Evaluation (Signed)
Anesthesia Post Note  Patient: Marc Blake  Procedure(s) Performed: Procedure(s) (LRB): CYSTOSCOPY WITH URETHRAL DILATATION WITH CATHETER PLACEMENT (N/A) CYSTOSCOPY WITH BIOPSY  Patient location during evaluation: PACU Anesthesia Type: General Level of consciousness: awake and alert Pain management: pain level controlled Vital Signs Assessment: post-procedure vital signs reviewed and stable Respiratory status: spontaneous breathing and respiratory function stable Cardiovascular status: stable Anesthetic complications: no    Last Vitals:  Vitals:   03/06/16 1539 03/06/16 1550  BP: 113/67 (!) 114/59  Pulse: 61 (!) 56  Resp: 14 16  Temp: 36.1 C 36.5 C    Last Pain:  Vitals:   03/07/16 1036  TempSrc:   PainSc: 0-No pain                 Alarik Radu K

## 2016-03-09 LAB — SURGICAL PATHOLOGY

## 2016-03-10 ENCOUNTER — Other Ambulatory Visit: Payer: Self-pay | Admitting: Hematology and Oncology

## 2016-03-10 ENCOUNTER — Ambulatory Visit
Admission: RE | Admit: 2016-03-10 | Discharge: 2016-03-10 | Disposition: A | Discharge status: Home / Self Care | Payer: 59 | Source: Ambulatory Visit | Attending: Hematology and Oncology | Admitting: Hematology and Oncology

## 2016-03-10 ENCOUNTER — Inpatient Hospital Stay (HOSPITAL_BASED_OUTPATIENT_CLINIC_OR_DEPARTMENT_OTHER): Payer: 59 | Admitting: Hematology and Oncology

## 2016-03-10 ENCOUNTER — Encounter: Payer: Self-pay | Admitting: Hematology and Oncology

## 2016-03-10 ENCOUNTER — Other Ambulatory Visit: Payer: Self-pay | Admitting: *Deleted

## 2016-03-10 ENCOUNTER — Inpatient Hospital Stay: Payer: 59

## 2016-03-10 ENCOUNTER — Other Ambulatory Visit: Payer: Self-pay

## 2016-03-10 VITALS — BP 143/66 | HR 92 | Temp 97.6°F | Wt 264.0 lb

## 2016-03-10 DIAGNOSIS — C642 Malignant neoplasm of left kidney, except renal pelvis: Secondary | ICD-10-CM

## 2016-03-10 DIAGNOSIS — Z86718 Personal history of other venous thrombosis and embolism: Secondary | ICD-10-CM

## 2016-03-10 DIAGNOSIS — Z923 Personal history of irradiation: Secondary | ICD-10-CM

## 2016-03-10 DIAGNOSIS — Z7901 Long term (current) use of anticoagulants: Secondary | ICD-10-CM

## 2016-03-10 DIAGNOSIS — C7951 Secondary malignant neoplasm of bone: Secondary | ICD-10-CM

## 2016-03-10 DIAGNOSIS — N1330 Unspecified hydronephrosis: Secondary | ICD-10-CM

## 2016-03-10 DIAGNOSIS — R0602 Shortness of breath: Secondary | ICD-10-CM

## 2016-03-10 DIAGNOSIS — N2889 Other specified disorders of kidney and ureter: Secondary | ICD-10-CM | POA: Diagnosis not present

## 2016-03-10 DIAGNOSIS — C7800 Secondary malignant neoplasm of unspecified lung: Secondary | ICD-10-CM

## 2016-03-10 DIAGNOSIS — J9811 Atelectasis: Secondary | ICD-10-CM | POA: Diagnosis not present

## 2016-03-10 DIAGNOSIS — E785 Hyperlipidemia, unspecified: Secondary | ICD-10-CM

## 2016-03-10 DIAGNOSIS — F419 Anxiety disorder, unspecified: Secondary | ICD-10-CM

## 2016-03-10 DIAGNOSIS — R7989 Other specified abnormal findings of blood chemistry: Secondary | ICD-10-CM

## 2016-03-10 DIAGNOSIS — F329 Major depressive disorder, single episode, unspecified: Secondary | ICD-10-CM

## 2016-03-10 DIAGNOSIS — Z8701 Personal history of pneumonia (recurrent): Secondary | ICD-10-CM

## 2016-03-10 DIAGNOSIS — I252 Old myocardial infarction: Secondary | ICD-10-CM

## 2016-03-10 DIAGNOSIS — L309 Dermatitis, unspecified: Secondary | ICD-10-CM

## 2016-03-10 DIAGNOSIS — K219 Gastro-esophageal reflux disease without esophagitis: Secondary | ICD-10-CM

## 2016-03-10 DIAGNOSIS — J90 Pleural effusion, not elsewhere classified: Secondary | ICD-10-CM | POA: Diagnosis not present

## 2016-03-10 DIAGNOSIS — Z79899 Other long term (current) drug therapy: Secondary | ICD-10-CM

## 2016-03-10 DIAGNOSIS — S72402K Unspecified fracture of lower end of left femur, subsequent encounter for closed fracture with nonunion: Secondary | ICD-10-CM

## 2016-03-10 DIAGNOSIS — M129 Arthropathy, unspecified: Secondary | ICD-10-CM

## 2016-03-10 DIAGNOSIS — Z85828 Personal history of other malignant neoplasm of skin: Secondary | ICD-10-CM

## 2016-03-10 DIAGNOSIS — I10 Essential (primary) hypertension: Secondary | ICD-10-CM

## 2016-03-10 DIAGNOSIS — N3289 Other specified disorders of bladder: Secondary | ICD-10-CM

## 2016-03-10 LAB — COMPREHENSIVE METABOLIC PANEL
ALT: 11 U/L — ABNORMAL LOW (ref 17–63)
AST: 15 U/L (ref 15–41)
Albumin: 3.4 g/dL — ABNORMAL LOW (ref 3.5–5.0)
Alkaline Phosphatase: 80 U/L (ref 38–126)
Anion gap: 5 (ref 5–15)
BUN: 24 mg/dL — ABNORMAL HIGH (ref 6–20)
CO2: 31 mmol/L (ref 22–32)
Calcium: 8.9 mg/dL (ref 8.9–10.3)
Chloride: 105 mmol/L (ref 101–111)
Creatinine, Ser: 1.65 mg/dL — ABNORMAL HIGH (ref 0.61–1.24)
GFR calc Af Amer: 51 mL/min — ABNORMAL LOW (ref >60)
GFR calc non Af Amer: 44 mL/min — ABNORMAL LOW (ref >60)
Glucose, Bld: 128 mg/dL — ABNORMAL HIGH (ref 65–99)
Potassium: 4.6 mmol/L (ref 3.5–5.1)
Sodium: 141 mmol/L (ref 135–145)
Total Bilirubin: 0.5 mg/dL (ref 0.3–1.2)
Total Protein: 6.6 g/dL (ref 6.5–8.1)

## 2016-03-10 LAB — CBC WITH DIFFERENTIAL/PLATELET
Basophils Absolute: 0 10*3/uL (ref 0–0.1)
Basophils Relative: 1 %
Eosinophils Absolute: 0.4 10*3/uL (ref 0–0.7)
Eosinophils Relative: 8 %
HCT: 27.2 % — ABNORMAL LOW (ref 40.0–52.0)
Hemoglobin: 8.8 g/dL — ABNORMAL LOW (ref 13.0–18.0)
Lymphocytes Relative: 11 %
Lymphs Abs: 0.5 10*3/uL — ABNORMAL LOW (ref 1.0–3.6)
MCH: 29.7 pg (ref 26.0–34.0)
MCHC: 32.6 g/dL (ref 32.0–36.0)
MCV: 91.1 fL (ref 80.0–100.0)
Monocytes Absolute: 0.6 10*3/uL (ref 0.2–1.0)
Monocytes Relative: 13 %
Neutro Abs: 2.9 10*3/uL (ref 1.4–6.5)
Neutrophils Relative %: 67 %
Platelets: 188 10*3/uL (ref 150–440)
RBC: 2.98 MIL/uL — ABNORMAL LOW (ref 4.40–5.90)
RDW: 17.5 % — ABNORMAL HIGH (ref 11.5–14.5)
WBC: 4.3 10*3/uL (ref 3.8–10.6)

## 2016-03-10 LAB — MAGNESIUM: Magnesium: 2.1 mg/dL (ref 1.7–2.4)

## 2016-03-10 LAB — T4, FREE: Free T4: 0.82 ng/dL (ref 0.61–1.12)

## 2016-03-10 LAB — TSH: TSH: 3.812 u[IU]/mL (ref 0.350–4.500)

## 2016-03-10 NOTE — Progress Notes (Signed)
Patient here for pre treatment check up. Recently added nebulizer for breathing  and topical cream for rash also is ABT therapy post cysto.

## 2016-03-10 NOTE — Progress Notes (Signed)
[facility] day:  03/10/16   Chief Complaint: Marc Blake is a 60 y.o. male with metastatic renal cell carcinoma who is seen for assessment prior to cycle #4 nivolumab.  HPI: The patient was last seen in the medical oncology clinic on 02/25/2016.  At that time, he was back to baseline.  His psoriasis was more active likely due to nivolumab.  He received cycle #3 nivolumab.  During the interim, he was seen by Dr. Marry Guan of orthopedic surgery. 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  There is possible increase in change in the lytic lesion with some movement at the fracture site.  He has broken the distal locking screw.  He is followed up with Dr. Raul Del of pulmonary medicine. He was given a prescription for oxygen as his O2 sats were 80-89% with ambulation. He states that he can walk about 100 feet.  He notes no prior history of smoking or asthma. He feels a tingling in his arms when he is short of breath.  He was seen by Dr. [name].  He was off his Xarelto for 3 days then underwent cystoscopy, balloon urethral dilatation, and bladder biopsy with Foley catheter placement on 03/06/2016.  Bladder biopsy revealed cystitis with reactive changes, negative for atypia and malignancy.  He states that his Foley catheter will be removed on 03/13/2016.  He was placed on topical steroids for his psoriasis. He notes that his rash is a lot better.   Past Medical History:  Diagnosis Date  . Acid reflux   . Anxiety   . Arthritis   . Depression   . Depression   . Hyperlipidemia   . Hypertension   . Myocardial infarction ([place])   . Renal cancer ([place])   . Renal cell carcinoma ([place])   . Skin cancer     Past Surgical History:  Procedure Laterality Date  . BACK SURGERY    . CARDIAC CATHETERIZATION     with stent  . CYSTOSCOPY WITH BIOPSY  03/06/2016   Procedure: CYSTOSCOPY WITH BIOPSY;  Surgeon:  [name], MD;  Location: [facility] ORS;  Service: Urology;;  . [name] WITH URETHRAL DILATATION N/A 03/06/2016   Procedure: CYSTOSCOPY WITH URETHRAL DILATATION WITH CATHETER PLACEMENT;  Surgeon: [name], MD;  Location: [facility] ORS;  Service: Urology;  Laterality: N/A;  . KNEE SURGERY Left   . LEG SURGERY Left   . TONSILLECTOMY      Family History  Problem Relation Age of Onset  . Hypertension Father   . Heart attack Father   . Stroke Sister   . Hypertension Brother   . Stroke Maternal Uncle     Social History:  reports that he has never smoked. He has never used smokeless tobacco. He reports that he does not drink alcohol or use drugs.  He is originally from [place].  He then lived in [place] for 17 years.  He moved to [place] 2 months ago with his wife.  The patient is alone today.  Allergies: No Known Allergies  Current Medications: Current Outpatient Prescriptions  Medication Sig Dispense Refill  . albuterol (PROVENTIL HFA;VENTOLIN HFA) 108 (90 Base) MCG/ACT inhaler Inhale into the lungs.    Marland Kitchen amLODipine (NORVASC) 2.5 MG tablet TAKE 1 TABLET ONCE A DAY FOR 30 DAYS  2  . atorvastatin (LIPITOR) 20 MG tablet TAKE 1 TABLET ONCE A DAY (AT BEDTIME) FOR 30 DAYS  2  . busPIRone (BUSPAR) 5 MG tablet TAKE 1 TABLET 3 TIMES A DAY FOR 30 DAYS  2  . carvedilol (COREG) 12.5 MG tablet TAKE 1 TABLET BY MOUTH TWICE A DAY FOR 30 DAYS  3  . fluconazole (DIFLUCAN) 100 MG tablet Take 1 tablet (100 mg total) by mouth daily. 7 tablet 0  . gabapentin (NEURONTIN) 300 MG capsule Take 300 mg by mouth 3 (three) times daily.     Marland Kitchen ibuprofen (ADVIL,MOTRIN) 200 MG tablet Take 200 mg by mouth every 8 (eight) hours as needed for moderate pain. 4 every 8 hours    . lisinopril (PRINIVIL,ZESTRIL) 20 MG tablet TAKE 1 TABLET ONCE A DAY FOR 30 DAYS  3  . morphine (MS CONTIN) 15 MG 12 hr tablet Take 15 mg by mouth every 12 (twelve) hours.  0  . nitrofurantoin (MACRODANTIN) 50 MG capsule TAKE 1 CAPSULE BY  MOUTH AT BEDTIME FOR 30 DAYS  3  . Nivolumab (OPDIVO IV) Inject into the vein.    Marland Kitchen oxybutynin (DITROPAN) 5 MG tablet Take 1 tablet (5 mg total) by mouth every 8 (eight) hours as needed for bladder spasms. 30 tablet 0  . oxyCODONE (OXY IR/ROXICODONE) 5 MG immediate release tablet Take 5 mg by mouth every 8 (eight) hours as needed (patient takes 2 tabs in the evening only).     Marland Kitchen oxyCODONE (OXY IR/ROXICODONE) 5 MG immediate release tablet Take 1 tablet (5 mg total) by mouth every 4 (four) hours as needed for severe pain. 10 tablet 0  . senna (SENOKOT) 8.6 MG tablet Take 2 tablets by mouth at bedtime.     . sertraline (ZOLOFT) 100 MG tablet Take 100 mg by mouth at bedtime.  1  . sulfamethoxazole-trimethoprim (BACTRIM) 400-80 MG tablet Take 1 tablet by mouth 2 (two) times daily. 14 tablet 0  . tamsulosin (FLOMAX) 0.4 MG CAPS capsule Take 0.8 mg by mouth at bedtime.    . traZODone (DESYREL) 50 MG tablet Take 50 mg by mouth at bedtime.   3  . triamcinolone cream (KENALOG) 0.5 % Apply topically.    Alveda Reasons 20 MG TABS tablet Take 20 mg by mouth daily.  1   No current facility-administered medications for this visit.     Review of Systems:  GENERAL:  Feels "ok".  No fevers or sweats.  Weight down 2 pounds. PERFORMANCE STATUS (ECOG):  1 HEENT:  No visual changes, runny nose, sore throat, mouth sores or tenderness. Lungs:  Chronic shortness of breath (see HPI).  No cough or wheezing.  Tingly sensation in arms if exerts self.  No hemoptysis. Cardiac:  No chest pain, palpitations, orthopnea, or PND. GI:  No nausea, vomiting, constipation, melena or hematochezia. GU:  s/p cystoscopy, balloon urethral dilatation, and bladder biopsy.  Foley catheter in place.  No urgency, frequency, dysuria, or hematuria. Musculoskeletal:  Femur issues (see HPI).  No muscle tenderness. Extremities:  Arms numb and tingly with exertion.  No change in chronic lower extremity swelling. Skin:  Eczema flare on elbows,  improved with steroids. Neuro:  No headache, numbness or weakness.  Decreased feeling left leg.  Slight balance problem. Endocrine:  No diabetes, thyroid issues, hot flashes or night sweats. Psych:  No mood changes, depression or anxiety. Pain:  No focal pain. Review of systems:  All other systems reviewed and found to be negative.  Physical Exam: Blood pressure (!) 143/66, pulse 92, temperature 97.6 F (36.4 C), temperature source Tympanic, weight 264 lb (119.7 kg),  SpO2 91 %. GENERAL:  Well developed, well nourished, gentleman sitting comfortably in the exam room in no acute distress.  He has a rolling walker at his side. MENTAL STATUS:  Alert and oriented to person, place and time. HEAD:  Long gray hair with goatee.  Normocephalic, atraumatic, face symmetric, no Cushingoid features. EYES:  Pupils equal round and reactive to light and accomodation.  No conjunctivitis or scleral icterus. ENT:  Oropharynx clear without lesion.  Poor dentition.  Tongue normal. Mucous membranes moist.  RESPIRATORY:  Clear to auscultation without rales, wheezes or rhonchi. CARDIOVASCULAR:  Regular rate and rhythm without murmur, rub or gallop.  No JVD. ABDOMEN:  Soft, non-tender, with active bowel sounds, and no hepatosplenomegaly.  No masses. GU:  Foley catheter in place. SKIN:  Tattoos.  Mild facial erythema.  Eczema on dorsal surface of forearms, improved.  Patchy eczema right flask (stable).  EXTREMITIES: Chronic dense lower extremity edema (left > right).  No skin discoloration or tenderness.  No palpable cords. LYMPH NODES: No palpable cervical, supraclavicular, axillary or inguinal adenopathy  NEUROLOGICAL:  Stable.  No change in ambulation. PSYCH:  Appropriate.  .  Orders Only on 03/10/2016  Component Date Value Ref Range Status  . WBC 03/10/2016 4.3  3.8 - 10.6 K/uL Final  . RBC 03/10/2016 2.98* 4.40 - 5.90 MIL/uL Final  . Hemoglobin 03/10/2016 8.8* 13.0 - 18.0 g/dL Final  . HCT 03/10/2016 27.2*  40.0 - 52.0 % Final  . MCV 03/10/2016 91.1  80.0 - 100.0 fL Final  . MCH 03/10/2016 29.7  26.0 - 34.0 pg Final  . MCHC 03/10/2016 32.6  32.0 - 36.0 g/dL Final  . RDW 03/10/2016 17.5* 11.5 - 14.5 % Final  . Platelets 03/10/2016 188  150 - 440 K/uL Final  . Neutrophils Relative % 03/10/2016 67  % Final  . Neutro Abs 03/10/2016 2.9  1.4 - 6.5 K/uL Final  . Lymphocytes Relative 03/10/2016 11  % Final  . Lymphs Abs 03/10/2016 0.5* 1.0 - 3.6 K/uL Final  . Monocytes Relative 03/10/2016 13  % Final  . Monocytes Absolute 03/10/2016 0.6  0.2 - 1.0 K/uL Final  . Eosinophils Relative 03/10/2016 8  % Final  . Eosinophils Absolute 03/10/2016 0.4  0 - 0.7 K/uL Final  . Basophils Relative 03/10/2016 1  % Final  . Basophils Absolute 03/10/2016 0.0  0 - 0.1 K/uL Final    Assessment:  Marc Blake is a 60 y.o. male with metastatic renal cell carcinoma presenting in 12/2013.  He had a large left renal mass, rib and vertebral body involvement, and multiple pulmonary nodules.  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    He was on Votrient (pazopanib) from 02/13/2014 - 01/21/2016.  He began nivolumab on 01/28/2016.  Abdominal and pelvic CT scan on 01/12/2014 revealed an 8.5 x 10.2 x 9.2 cm irregularly enhancing mass of the left kidney with some internal calcifications. There were enlarged left sided renal hilar lymph nodes. There were multiple pulmonary nodules in both lower lobes (largest 1.1 cm). There was a destructive bone lesion with an irregular 8.6 x 5.1 cm soft tissue mass invasive of the spinal canal narrowing at least 50% and disturbing the pedicle and portions of the left transverse process rib and T8 vertebral body.   Chest CT on 01/15/2014 revealed innumerable pulmonary nodules, a large metastatic lesion at T8 and left eighth rib with invasion into the spinal canal and moderate canal  stenosis. There was a left upper pole right kidney mass.  Head MRI  was negative.  He received 3000 cGy to T7 - T9 and associated ribs beginning 05/11/2014.  On 06/15/2014, he received 1 dose of nivolumab.  He presented with progressive paralysis from the waist down on 07/06/2014.  He underwent left posterolateral thoracotomy with left chest wall resection with T8 corpectomy and interbody fusion with donor bone on 07/06/2014.  Pathology revealed metastatic renal cell carcinoma. Post-operatively, he developed sepsis secondary to a submandibular abscess dental abscess.  He underwent tracheostomy, I&D of a right neck abscess, tooth extraction, and PEG tube placement.  Zometa has subsequently been on hold.  He developed a pathologic fracture of the left femur.  He is s/p intramedullary nailing on 01/27/2015.  He received radiation to the left leg.  He was admitted on 04/26/2015 with aspiration pneumonia and opioid overdose. He is currently taking MSContin 15 mg BID and oxycodone 5 mg q day.  Chest, abdomen, and pelvic CT scan on 05/31/2015 revealed slight enlargement of multiple pulmonary nodules.  The left renal mass was similar (12.2 x 9.6 cm).  The right kidney lesion was stable (lower pole lesion 2 x 1.7 cm).  Bone scan on 05/31/2015 revealed slight increased activity in the left costal vertebral junction at T7.  Chest, abdomen, and pelvic CT scan on 12/14/2015 revealed an irregular 10.7 cm heterogeneous renal cortical mass in the upper left kidney and a heterogeneous 2.3 cm renal cortical mass in the lateral lower right kidney.  There was mild bilateral hydroureteronephrosis with bladder distention.  There was mild left para-aortic lymphadenopathy.  There were numerous (greater than 20) pulmonary metastases.  Largest nodule was 2.1 cm in the RLL. There was a partially visualized mildly expansile lytic lesion in the left proximal femur status post surgical transfixation.  Thre was a small probably loculated pleural effusion at the left eighth rib resection site.  There  were no other bone abnormalities.  Bone scan on 12/23/2015 revealed photopenia from prior removal of the left eighth rib. There was increased  uptake in the medial thoracic spine from T7 and T9 is felt to be due to postoperative fusion.  There was increased uptake throughout much of the left femur as well as increased uptake in the soft tissues of much of the left femur which may be due to previous surgery and radiation therapy change.  He developed a left lower extremity DVT (popliteal and soleal vein) on 06/07/2015.  Left lower extremity ultrasound on 02/07/2016 revealed no evidence of DVT.  He is on long term Xarelto.  Patient has a long standing history of an obstructive uropathy and elevated PVR.  He has undergone urethral dilatation on several occasions.  He underwent cystoscopy, balloon urethral dilatation, and bladder biopsy with Foley catheter placement on 03/06/2016.  Bladder biopsy revealed cystitis with reactive changes, negative for atypia and malignancy.  Foley catheter remains in place.  He is s/p 3 cycles of nivolumab (01/28/2016 - 02/25/2016).  He tolerated his infusions well.  TSH was 6.239 (0.35-4.50) with a normal free T4 (0.79) on 02/25/2016.  He was admitted to [facility] from 02/14/2016 - 02/16/2016 with chest pain.  Chest CT angiogram on 02/15/2016 ruled out pulmonary embolism. Imaging revealed slight progression of mediastinal left hilar adenopathy was pulmonary metastatic disease. Stress test on 02/15/2016 ruled out cardiac ischemia.  He was treated with Augmentin for presumed lower extremity cellulitis.  He has been seen by orthopedics.  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  There is possible increase in change in the lytic lesion with some movement at the fracture site.  He has broken the distal locking screw.  He ambulates with a rolling walker now.  He has been referred to [place].  He has been seen by pulmonary medicine.  He has been prescribed  oxygen.    His psoriasis flared on nivolumab, but has improved with topical steroids  Symptomatically, he notes tingling in his arms when short of breath.  He has been prescribed oxygen.   Plan: 1.  Labs today:  CBC with diff, CMP, Mg, TSH, free T4. 2.  Postpone cycle #4 nivolumab secondary to pulmonary issues. 3.  Chest CT today- r/o pneumonitis. 4.  Contact Dr. Raul Del. 5.  Assist with [place] orthopedics appointment. 6.  Review cystoscopy and bladder issues. 7.  Continue topical steroids for eczema. 8.  RTC in 1 week for for MD assess, labs (CBC with diff, CMP), and +/- nivolumab.  Addendum:  Chest CT reveals no evidence of pneumonitis.  Spoke with Dr. Raul Del.  PFTs suggest restrictive disease secondary to chest wall issues.  He will confirm when he returns to his office.  Anticipate cycle #4 nivolumab next week once confirmed.   [name], MD  03/10/2016 , 9:09 AM

## 2016-03-13 ENCOUNTER — Ambulatory Visit (INDEPENDENT_AMBULATORY_CARE_PROVIDER_SITE_OTHER): Payer: 59

## 2016-03-13 ENCOUNTER — Telehealth: Payer: Self-pay

## 2016-03-13 DIAGNOSIS — N99111 Postprocedural bulbous urethral stricture: Secondary | ICD-10-CM

## 2016-03-13 NOTE — Telephone Encounter (Signed)
LMOM

## 2016-03-13 NOTE — Progress Notes (Signed)
Continuous Intermittent Catheterization  Due to urethral stricture patient is present today for a teaching of self I & O Catheterization. Patient was given detailed verbal and printed instructions of self catheterization. Patient was cleaned and prepped in a sterile fashion.  With instruction and assistance patient inserted a 16FR and urine return was noted 10 ml, urine was yellow in color. Patient tolerated well, no complications were noted Patient was given a sample bag with supplies to take home.  Instructions were given per Dr. Erlene Quan for patient to cath once daily.    Preformed by: Toniann Fail, LPN   Additional Notes: 23f foley was removed prior to CIC teaching. Pt tolerated well.

## 2016-03-13 NOTE — Telephone Encounter (Signed)
Please let the patient know that his biopsy result of his bladder was negative, no evidence of cancer. He has chronic cystitis.  Patient follow-up in 3 months with uroflow/PVR. Please arrange.  [name], MD

## 2016-03-13 NOTE — Telephone Encounter (Signed)
Pt came in today for cath removal and CIC teaching post surgery. Pt stated he does not have a post op f/u appt. Please advise.

## 2016-03-14 NOTE — Telephone Encounter (Signed)
Spoke with pt in reference to bladder bx results and f/u OV. Pt voiced understanding. OV made and information mailed to pt at pt request.

## 2016-03-17 ENCOUNTER — Ambulatory Visit: Payer: 59

## 2016-03-17 ENCOUNTER — Inpatient Hospital Stay (HOSPITAL_BASED_OUTPATIENT_CLINIC_OR_DEPARTMENT_OTHER): Payer: 59 | Admitting: Hematology and Oncology

## 2016-03-17 ENCOUNTER — Other Ambulatory Visit: Payer: 59

## 2016-03-17 ENCOUNTER — Inpatient Hospital Stay: Payer: 59

## 2016-03-17 ENCOUNTER — Ambulatory Visit: Payer: 59 | Admitting: Hematology and Oncology

## 2016-03-17 ENCOUNTER — Other Ambulatory Visit: Payer: Self-pay | Admitting: *Deleted

## 2016-03-17 ENCOUNTER — Encounter: Payer: Self-pay | Admitting: Hematology and Oncology

## 2016-03-17 VITALS — BP 130/78 | HR 66 | Temp 97.6°F | Resp 18 | Wt 260.5 lb

## 2016-03-17 DIAGNOSIS — C642 Malignant neoplasm of left kidney, except renal pelvis: Secondary | ICD-10-CM | POA: Diagnosis not present

## 2016-03-17 DIAGNOSIS — C7800 Secondary malignant neoplasm of unspecified lung: Secondary | ICD-10-CM | POA: Diagnosis not present

## 2016-03-17 DIAGNOSIS — Z79899 Other long term (current) drug therapy: Secondary | ICD-10-CM

## 2016-03-17 DIAGNOSIS — Z8701 Personal history of pneumonia (recurrent): Secondary | ICD-10-CM

## 2016-03-17 DIAGNOSIS — Z23 Encounter for immunization: Secondary | ICD-10-CM | POA: Diagnosis not present

## 2016-03-17 DIAGNOSIS — N1330 Unspecified hydronephrosis: Secondary | ICD-10-CM

## 2016-03-17 DIAGNOSIS — L309 Dermatitis, unspecified: Secondary | ICD-10-CM

## 2016-03-17 DIAGNOSIS — Z86718 Personal history of other venous thrombosis and embolism: Secondary | ICD-10-CM

## 2016-03-17 DIAGNOSIS — Z85828 Personal history of other malignant neoplasm of skin: Secondary | ICD-10-CM

## 2016-03-17 DIAGNOSIS — I252 Old myocardial infarction: Secondary | ICD-10-CM

## 2016-03-17 DIAGNOSIS — M129 Arthropathy, unspecified: Secondary | ICD-10-CM

## 2016-03-17 DIAGNOSIS — N3289 Other specified disorders of bladder: Secondary | ICD-10-CM

## 2016-03-17 DIAGNOSIS — C7951 Secondary malignant neoplasm of bone: Secondary | ICD-10-CM

## 2016-03-17 DIAGNOSIS — K219 Gastro-esophageal reflux disease without esophagitis: Secondary | ICD-10-CM

## 2016-03-17 DIAGNOSIS — Z7901 Long term (current) use of anticoagulants: Secondary | ICD-10-CM

## 2016-03-17 DIAGNOSIS — E785 Hyperlipidemia, unspecified: Secondary | ICD-10-CM

## 2016-03-17 DIAGNOSIS — F419 Anxiety disorder, unspecified: Secondary | ICD-10-CM

## 2016-03-17 DIAGNOSIS — F329 Major depressive disorder, single episode, unspecified: Secondary | ICD-10-CM

## 2016-03-17 DIAGNOSIS — Z923 Personal history of irradiation: Secondary | ICD-10-CM

## 2016-03-17 DIAGNOSIS — I10 Essential (primary) hypertension: Secondary | ICD-10-CM

## 2016-03-17 LAB — CBC WITH DIFFERENTIAL/PLATELET
Basophils Absolute: 0.1 10*3/uL (ref 0–0.1)
Basophils Relative: 1 %
Eosinophils Absolute: 0.3 10*3/uL (ref 0–0.7)
Eosinophils Relative: 6 %
HCT: 29.7 % — ABNORMAL LOW (ref 40.0–52.0)
Hemoglobin: 9.7 g/dL — ABNORMAL LOW (ref 13.0–18.0)
Lymphocytes Relative: 12 %
Lymphs Abs: 0.7 10*3/uL — ABNORMAL LOW (ref 1.0–3.6)
MCH: 29.2 pg (ref 26.0–34.0)
MCHC: 32.8 g/dL (ref 32.0–36.0)
MCV: 89 fL (ref 80.0–100.0)
Monocytes Absolute: 0.6 10*3/uL (ref 0.2–1.0)
Monocytes Relative: 10 %
Neutro Abs: 4.1 10*3/uL (ref 1.4–6.5)
Neutrophils Relative %: 71 %
Platelets: 205 10*3/uL (ref 150–440)
RBC: 3.34 MIL/uL — ABNORMAL LOW (ref 4.40–5.90)
RDW: 17.2 % — ABNORMAL HIGH (ref 11.5–14.5)
WBC: 5.7 10*3/uL (ref 3.8–10.6)

## 2016-03-17 LAB — BASIC METABOLIC PANEL
Anion gap: 8 (ref 5–15)
BUN: 17 mg/dL (ref 6–20)
CO2: 28 mmol/L (ref 22–32)
Calcium: 9.1 mg/dL (ref 8.9–10.3)
Chloride: 104 mmol/L (ref 101–111)
Creatinine, Ser: 1.18 mg/dL (ref 0.61–1.24)
GFR calc Af Amer: >60 mL/min (ref >60)
GFR calc non Af Amer: >60 mL/min (ref >60)
Glucose, Bld: 112 mg/dL — ABNORMAL HIGH (ref 65–99)
Potassium: 4.4 mmol/L (ref 3.5–5.1)
Sodium: 140 mmol/L (ref 135–145)

## 2016-03-17 MED ORDER — NIVOLUMAB CHEMO INJECTION 100 MG/10ML
240.0000 mg | Freq: Once | INTRAVENOUS | Status: AC
  Administered 2016-03-17: 240 mg via INTRAVENOUS
  Filled 2016-03-17: qty 10

## 2016-03-17 MED ORDER — INFLUENZA VAC SPLIT QUAD 0.5 ML IM SUSY
0.5000 mL | PREFILLED_SYRINGE | Freq: Once | INTRAMUSCULAR | Status: AC
  Administered 2016-03-17: 0.5 mL via INTRAMUSCULAR
  Filled 2016-03-17: qty 0.5

## 2016-03-17 MED ORDER — SODIUM CHLORIDE 0.9 % IV SOLN
Freq: Once | INTRAVENOUS | Status: AC
  Administered 2016-03-17: 11:00:00 via INTRAVENOUS
  Filled 2016-03-17: qty 1000

## 2016-03-17 NOTE — Progress Notes (Signed)
[facility] day:  03/17/16   Chief Complaint: Marc Blake is a 60 y.o. male with metastatic renal cell carcinoma who is seen for assessment prior to cycle #4 nivolumab.  HPI: The patient was last seen in the medical oncology clinic on 03/10/2016.  At that time, he noted tingling in his arms when short of breath.  He had been prescribed oxygen.  Cycle #4 nivolumab was postponed secondary to pulmonary issues.  Chest CT on 03/10/2016 revealed no evidence of pneumonia or pneumonitis.  There was left mild atelectasis small effusion similar to prior.  There were bilateral multiple metastasis (stable).   I spoke with Dr. Raul Del.  Pulmonary function test pattern was felt due to his weight and radiation effects. He was not felt to warrant steroids and Nivolumab was not felt to be contraindicated.  During the interim, he states that had a great week. He is breathing better. He has not gotten his oxygen as he declined large cannister for a portable pack.  Pain in his arms with exertion has disappeared.  Swelling in his leg has improved.  He has decided to put off the dentist until after the first of the year. His Foley catheter was in for 8 days and has been removed. He is "peeing like a 60 year old".  He has an appointment with [facility] on 04/05/2016.   Past Medical History:  Diagnosis Date  . Acid reflux   . Anxiety   . Arthritis   . Depression   . Depression   . Hyperlipidemia   . Hypertension   . Myocardial infarction ([place])   . Renal cancer ([place])   . Renal cell carcinoma ([place])   . Skin cancer     Past Surgical History:  Procedure Laterality Date  . BACK SURGERY    . CARDIAC CATHETERIZATION     with stent  . CYSTOSCOPY WITH BIOPSY  03/06/2016   Procedure: CYSTOSCOPY WITH BIOPSY;  Surgeon: [name], MD;  Location: [facility] ORS;  Service: Urology;;  . [name] WITH URETHRAL DILATATION N/A 03/06/2016   Procedure: CYSTOSCOPY  WITH URETHRAL DILATATION WITH CATHETER PLACEMENT;  Surgeon: [name], MD;  Location: [facility] ORS;  Service: Urology;  Laterality: N/A;  . KNEE SURGERY Left   . LEG SURGERY Left   . TONSILLECTOMY      Family History  Problem Relation Age of Onset  . Hypertension Father   . Heart attack Father   . Stroke Sister   . Hypertension Brother   . Stroke Maternal Uncle     Social History:  reports that he has never smoked. He has never used smokeless tobacco. He reports that he does not drink alcohol or use drugs.  He is originally from [place].  He then lived in [place] for 17 years.  He moved to [place] 2 months ago with his wife.  The patient is alone today.  Allergies: No Known Allergies  Current Medications: Current Outpatient Prescriptions  Medication Sig Dispense Refill  . albuterol (PROVENTIL HFA;VENTOLIN HFA) 108 (90 Base) MCG/ACT inhaler Inhale into the lungs.    Marland Kitchen amLODipine (NORVASC) 2.5 MG tablet TAKE 1 TABLET ONCE A DAY FOR 30 DAYS  2  . atorvastatin (LIPITOR) 20 MG tablet TAKE 1 TABLET ONCE A DAY (AT BEDTIME) FOR 30 DAYS  2  . busPIRone (BUSPAR) 5 MG tablet TAKE 1 TABLET 3 TIMES A DAY FOR 30 DAYS  2  . carvedilol (COREG) 12.5 MG tablet TAKE 1  TABLET BY MOUTH TWICE A DAY FOR 30 DAYS  3  . fluconazole (DIFLUCAN) 100 MG tablet Take 1 tablet (100 mg total) by mouth daily. 7 tablet 0  . gabapentin (NEURONTIN) 300 MG capsule Take 300 mg by mouth 3 (three) times daily.     Marland Kitchen ibuprofen (ADVIL,MOTRIN) 200 MG tablet Take 200 mg by mouth every 8 (eight) hours as needed for moderate pain. 4 every 8 hours    . lisinopril (PRINIVIL,ZESTRIL) 20 MG tablet TAKE 1 TABLET ONCE A DAY FOR 30 DAYS  3  . morphine (MS CONTIN) 15 MG 12 hr tablet Take 15 mg by mouth every 12 (twelve) hours.  0  . nitrofurantoin (MACRODANTIN) 50 MG capsule TAKE 1 CAPSULE BY MOUTH AT BEDTIME FOR 30 DAYS  3  . Nivolumab (OPDIVO IV) Inject into the vein.    Marland Kitchen oxybutynin (DITROPAN) 5 MG tablet Take 1 tablet (5 mg  total) by mouth every 8 (eight) hours as needed for bladder spasms. 30 tablet 0  . oxyCODONE (OXY IR/ROXICODONE) 5 MG immediate release tablet Take 5 mg by mouth every 8 (eight) hours as needed (patient takes 2 tabs in the evening only).     Marland Kitchen oxyCODONE (OXY IR/ROXICODONE) 5 MG immediate release tablet Take 1 tablet (5 mg total) by mouth every 4 (four) hours as needed for severe pain. 10 tablet 0  . senna (SENOKOT) 8.6 MG tablet Take 2 tablets by mouth at bedtime.     . sertraline (ZOLOFT) 100 MG tablet Take 100 mg by mouth at bedtime.  1  . tamsulosin (FLOMAX) 0.4 MG CAPS capsule Take 0.8 mg by mouth at bedtime.    . traZODone (DESYREL) 50 MG tablet Take 50 mg by mouth at bedtime.   3  . triamcinolone cream (KENALOG) 0.5 % Apply topically.    Alveda Reasons 20 MG TABS tablet Take 20 mg by mouth daily.  1  . sulfamethoxazole-trimethoprim (BACTRIM) 400-80 MG tablet Take 1 tablet by mouth 2 (two) times daily. (Patient not taking: Reported on 03/17/2016) 14 tablet 0   No current facility-administered medications for this visit.     Review of Systems:  GENERAL:  Feels much better.  No fevers or sweats.  Weight down 4 pounds (fluid weight). PERFORMANCE STATUS (ECOG):  1 HEENT:  No visual changes, runny nose, sore throat, mouth sores or tenderness. Lungs:  Chronic shortness of breath (improved).  No cough or wheezing.  Tingly sensation in arms if exerts self, resolved.  No hemoptysis. Cardiac:  No chest pain, palpitations, orthopnea, or PND. GI:  No nausea, vomiting, constipation, melena or hematochezia. GU:  s/p cystoscopy, balloon urethral dilatation, and bladder biopsy.  Foley catheter out.  Voiding well.  No urgency, frequency, dysuria, or hematuria. Musculoskeletal:  Femur issues (see HPI).  No muscle tenderness. Extremities:  Appointment with [facility] on 10/18.  No change in chronic lower extremity swelling. Skin:  Eczema flare on elbows, improved with steroids (if he uses it). Neuro:  No  headache, numbness or weakness.  Decreased feeling left leg.  Slight balance problem. Endocrine:  No diabetes, thyroid issues, hot flashes or night sweats. Psych:  No mood changes, depression or anxiety. Pain:  No focal pain. Review of systems:  All other systems reviewed and found to be negative.  Physical Exam: Blood pressure 130/78, pulse 66, temperature 97.6 F (36.4 C), temperature source Tympanic, resp. rate 18, weight 260 lb 7.6 oz (118.2 kg). GENERAL:  Well developed, well nourished, gentleman sitting comfortably in  the exam room in no acute distress.  He has a rolling walker at his side. MENTAL STATUS:  Alert and oriented to person, place and time. HEAD:  Long gray hair with goatee.  Normocephalic, atraumatic, face symmetric, no Cushingoid features. EYES:  Pupils equal round and reactive to light and accomodation.  No conjunctivitis or scleral icterus. ENT:  Oropharynx clear without lesion.  Poor dentition.  Tongue normal. Mucous membranes moist.  RESPIRATORY:  Clear to auscultation without rales, wheezes or rhonchi. CARDIOVASCULAR:  Regular rate and rhythm without murmur, rub or gallop.  No JVD. ABDOMEN:  Soft, non-tender, with active bowel sounds, and no hepatosplenomegaly.  No masses. SKIN:  Tattoos. Eczema on dorsal surface of forearms.  Patchy eczema right flask (stable).  EXTREMITIES: Chronic dense lower extremity edema, improved (left > right).  No skin discoloration or tenderness.  No palpable cords. LYMPH NODES: No palpable cervical, supraclavicular, axillary or inguinal adenopathy  NEUROLOGICAL:  Stable.  No change in ambulation. PSYCH:  Appropriate.  Marland Kitchen  Appointment on 03/17/2016  Component Date Value Ref Range Status  . WBC 03/17/2016 5.7  3.8 - 10.6 K/uL Final  . RBC 03/17/2016 3.34* 4.40 - 5.90 MIL/uL Final  . Hemoglobin 03/17/2016 9.7* 13.0 - 18.0 g/dL Final  . HCT 03/17/2016 29.7* 40.0 - 52.0 % Final  . MCV 03/17/2016 89.0  80.0 - 100.0 fL Final  . MCH  03/17/2016 29.2  26.0 - 34.0 pg Final  . MCHC 03/17/2016 32.8  32.0 - 36.0 g/dL Final  . RDW 03/17/2016 17.2* 11.5 - 14.5 % Final  . Platelets 03/17/2016 205  150 - 440 K/uL Final  . Neutrophils Relative % 03/17/2016 71  % Final  . Neutro Abs 03/17/2016 4.1  1.4 - 6.5 K/uL Final  . Lymphocytes Relative 03/17/2016 12  % Final  . Lymphs Abs 03/17/2016 0.7* 1.0 - 3.6 K/uL Final  . Monocytes Relative 03/17/2016 10  % Final  . Monocytes Absolute 03/17/2016 0.6  0.2 - 1.0 K/uL Final  . Eosinophils Relative 03/17/2016 6  % Final  . Eosinophils Absolute 03/17/2016 0.3  0 - 0.7 K/uL Final  . Basophils Relative 03/17/2016 1  % Final  . Basophils Absolute 03/17/2016 0.1  0 - 0.1 K/uL Final  . Sodium 03/17/2016 140  135 - 145 mmol/L Final  . Potassium 03/17/2016 4.4  3.5 - 5.1 mmol/L Final  . Chloride 03/17/2016 104  101 - 111 mmol/L Final  . CO2 03/17/2016 28  22 - 32 mmol/L Final  . Glucose, Bld 03/17/2016 112* 65 - 99 mg/dL Final  . BUN 03/17/2016 17  6 - 20 mg/dL Final  . Creatinine, Ser 03/17/2016 1.18  0.61 - 1.24 mg/dL Final  . Calcium 03/17/2016 9.1  8.9 - 10.3 mg/dL Final  . GFR calc non Af Amer 03/17/2016 >60  >60 mL/min Final  . GFR calc Af Amer 03/17/2016 >60  >60 mL/min Final   Comment: (NOTE) The eGFR has been calculated using the CKD EPI equation. This calculation has not been validated in all clinical situations. eGFR's persistently <60 mL/min signify possible Chronic Kidney Disease.   . Anion gap 03/17/2016 8  5 - 15 Final    Assessment:  Marc Blake is a 60 y.o. male with metastatic renal cell carcinoma presenting in 12/2013.  He had a large left renal mass, rib and vertebral body involvement, and multiple pulmonary nodules.  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  He was on Votrient (pazopanib) from 02/13/2014 - 01/21/2016.  He began nivolumab on 01/28/2016.  Abdominal and pelvic CT scan on 01/12/2014 revealed an  8.5 x 10.2 x 9.2 cm irregularly enhancing mass of the left kidney with some internal calcifications. There were enlarged left sided renal hilar lymph nodes. There were multiple pulmonary nodules in both lower lobes (largest 1.1 cm). There was a destructive bone lesion with an irregular 8.6 x 5.1 cm soft tissue mass invasive of the spinal canal narrowing at least 50% and disturbing the pedicle and portions of the left transverse process rib and T8 vertebral body.   Chest CT on 01/15/2014 revealed innumerable pulmonary nodules, a large metastatic lesion at T8 and left eighth rib with invasion into the spinal canal and moderate canal stenosis. There was a left upper pole right kidney mass.  Head MRI was negative.  He received 3000 cGy to T7 - T9 and associated ribs beginning 05/11/2014.  On 06/15/2014, he received 1 dose of nivolumab.  He presented with progressive paralysis from the waist down on 07/06/2014.  He underwent left posterolateral thoracotomy with left chest wall resection with T8 corpectomy and interbody fusion with donor bone on 07/06/2014.  Pathology revealed metastatic renal cell carcinoma. Post-operatively, he developed sepsis secondary to a submandibular abscess dental abscess.  He underwent tracheostomy, I&D of a right neck abscess, tooth extraction, and PEG tube placement.  Zometa has subsequently been on hold.  He developed a pathologic fracture of the left femur.  He is s/p intramedullary nailing on 01/27/2015.  He received radiation to the left leg.  He was admitted on 04/26/2015 with aspiration pneumonia and opioid overdose. He is currently taking MSContin 15 mg BID and oxycodone 5 mg q day.  Chest, abdomen, and pelvic CT scan on 05/31/2015 revealed slight enlargement of multiple pulmonary nodules.  The left renal mass was similar (12.2 x 9.6 cm).  The right kidney lesion was stable (lower pole lesion 2 x 1.7 cm).  Bone scan on 05/31/2015 revealed slight increased activity in the  left costal vertebral junction at T7.  Chest, abdomen, and pelvic CT scan on 12/14/2015 revealed an irregular 10.7 cm heterogeneous renal cortical mass in the upper left kidney and a heterogeneous 2.3 cm renal cortical mass in the lateral lower right kidney.  There was mild bilateral hydroureteronephrosis with bladder distention.  There was mild left para-aortic lymphadenopathy.  There were numerous (greater than 20) pulmonary metastases.  Largest nodule was 2.1 cm in the RLL. There was a partially visualized mildly expansile lytic lesion in the left proximal femur status post surgical transfixation.  Thre was a small probably loculated pleural effusion at the left eighth rib resection site.  There were no other bone abnormalities.  Bone scan on 12/23/2015 revealed photopenia from prior removal of the left eighth rib. There was increased  uptake in the medial thoracic spine from T7 and T9 is felt to be due to postoperative fusion.  There was increased uptake throughout much of the left femur as well as increased uptake in the soft tissues of much of the left femur which may be due to previous surgery and radiation therapy change.  He developed a left lower extremity DVT (popliteal and soleal vein) on 06/07/2015.  Left lower extremity ultrasound on 02/07/2016 revealed no evidence of DVT.  He is on long term Xarelto.  Patient has a long standing history of an obstructive uropathy and elevated PVR.  He has undergone urethral dilatation on several occasions.  He underwent cystoscopy, balloon urethral dilatation, and bladder biopsy with Foley catheter placement on 03/06/2016.  Bladder biopsy revealed cystitis with reactive changes, negative for atypia and malignancy.  Foley catheter remains in place.  He is s/p 3 cycles of nivolumab (01/28/2016 - 02/25/2016).  He tolerated his infusions well.  TSH was 6.239 (0.35-4.50) with a normal free T4 (0.79) on 02/25/2016.  He was admitted to [facility] from 02/14/2016 -  02/16/2016 with chest pain.  Chest CT angiogram on 02/15/2016 ruled out pulmonary embolism. Imaging revealed slight progression of mediastinal left hilar adenopathy was pulmonary metastatic disease. Stress test on 02/15/2016 ruled out cardiac ischemia.  He was treated with Augmentin for presumed lower extremity cellulitis.  Chest CT on 03/10/2016 revealed no evidence of pneumonia or pneumonitis.  There was left mild atelectasis small effusion similar to prior.  There were bilateral multiple metastasis (stable).   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  There is possible increase in change in the lytic lesion with some movement at the fracture site.  He has broken the distal locking screw.  He ambulates with a rolling walker.  He has been referred to [facility] [place].  He has restrictive lung disease secondary to his weight and prior radiation.  He has been prescribed oxygen (not received).    His psoriasis flared on nivolumab, but has improved with topical steroids.  He is postponing dental assessment until after 06/19/2016 Aaron Edelman on hold).  Symptomatically, he notes tingling in his arms when short of breath.  He has been prescribed oxygen.  Plan: 1.  Labs today:  CBC with diff, CMP. 2.  Review interval chest CT. 3.  Cycle #4 nivolumab. 4.  Influenza vaccine. 5.  Follow-up with [facility] on 04/05/2016. 6.  Continue topical steroids for eczema. 7.  RTC in 2 weeks for for MD assessment, labs (CBC with diff, CMP, TSH), and cycle #5 nivolumab.   [name], MD  03/17/2016 , 9:29 AM

## 2016-03-17 NOTE — Progress Notes (Signed)
Patient is doing well today, he does want his flu shot. He mentions that his breathing is better. Back pain today is 6/10, leg pain is 4/10

## 2016-03-26 ENCOUNTER — Other Ambulatory Visit: Payer: Self-pay | Admitting: Pain Medicine

## 2016-03-28 ENCOUNTER — Other Ambulatory Visit: Payer: Self-pay | Admitting: Pain Medicine

## 2016-03-31 ENCOUNTER — Inpatient Hospital Stay (HOSPITAL_BASED_OUTPATIENT_CLINIC_OR_DEPARTMENT_OTHER): Payer: 59 | Admitting: Hematology and Oncology

## 2016-03-31 ENCOUNTER — Inpatient Hospital Stay: Payer: 59 | Attending: Hematology and Oncology

## 2016-03-31 ENCOUNTER — Inpatient Hospital Stay: Payer: 59

## 2016-03-31 ENCOUNTER — Other Ambulatory Visit: Payer: Self-pay | Admitting: Hematology and Oncology

## 2016-03-31 ENCOUNTER — Other Ambulatory Visit: Payer: Self-pay | Admitting: *Deleted

## 2016-03-31 VITALS — BP 113/67 | HR 69 | Temp 97.2°F | Resp 18 | Wt 248.5 lb

## 2016-03-31 DIAGNOSIS — N3289 Other specified disorders of bladder: Secondary | ICD-10-CM | POA: Insufficient documentation

## 2016-03-31 DIAGNOSIS — C7951 Secondary malignant neoplasm of bone: Secondary | ICD-10-CM

## 2016-03-31 DIAGNOSIS — R7989 Other specified abnormal findings of blood chemistry: Secondary | ICD-10-CM

## 2016-03-31 DIAGNOSIS — J984 Other disorders of lung: Secondary | ICD-10-CM

## 2016-03-31 DIAGNOSIS — Z87891 Personal history of nicotine dependence: Secondary | ICD-10-CM

## 2016-03-31 DIAGNOSIS — E785 Hyperlipidemia, unspecified: Secondary | ICD-10-CM | POA: Diagnosis not present

## 2016-03-31 DIAGNOSIS — I252 Old myocardial infarction: Secondary | ICD-10-CM | POA: Insufficient documentation

## 2016-03-31 DIAGNOSIS — C642 Malignant neoplasm of left kidney, except renal pelvis: Secondary | ICD-10-CM

## 2016-03-31 DIAGNOSIS — Z86718 Personal history of other venous thrombosis and embolism: Secondary | ICD-10-CM | POA: Diagnosis not present

## 2016-03-31 DIAGNOSIS — L309 Dermatitis, unspecified: Secondary | ICD-10-CM | POA: Diagnosis not present

## 2016-03-31 DIAGNOSIS — K219 Gastro-esophageal reflux disease without esophagitis: Secondary | ICD-10-CM | POA: Diagnosis not present

## 2016-03-31 DIAGNOSIS — Z8781 Personal history of (healed) traumatic fracture: Secondary | ICD-10-CM | POA: Insufficient documentation

## 2016-03-31 DIAGNOSIS — C7800 Secondary malignant neoplasm of unspecified lung: Secondary | ICD-10-CM

## 2016-03-31 DIAGNOSIS — F329 Major depressive disorder, single episode, unspecified: Secondary | ICD-10-CM | POA: Insufficient documentation

## 2016-03-31 DIAGNOSIS — Z85828 Personal history of other malignant neoplasm of skin: Secondary | ICD-10-CM

## 2016-03-31 DIAGNOSIS — I10 Essential (primary) hypertension: Secondary | ICD-10-CM

## 2016-03-31 DIAGNOSIS — F419 Anxiety disorder, unspecified: Secondary | ICD-10-CM

## 2016-03-31 DIAGNOSIS — M129 Arthropathy, unspecified: Secondary | ICD-10-CM | POA: Diagnosis not present

## 2016-03-31 DIAGNOSIS — R0602 Shortness of breath: Secondary | ICD-10-CM

## 2016-03-31 DIAGNOSIS — Z5112 Encounter for antineoplastic immunotherapy: Secondary | ICD-10-CM | POA: Insufficient documentation

## 2016-03-31 DIAGNOSIS — Z79899 Other long term (current) drug therapy: Secondary | ICD-10-CM

## 2016-03-31 DIAGNOSIS — Z923 Personal history of irradiation: Secondary | ICD-10-CM | POA: Diagnosis not present

## 2016-03-31 DIAGNOSIS — Z7901 Long term (current) use of anticoagulants: Secondary | ICD-10-CM | POA: Diagnosis not present

## 2016-03-31 LAB — COMPREHENSIVE METABOLIC PANEL
ALT: 12 U/L — ABNORMAL LOW (ref 17–63)
AST: 16 U/L (ref 15–41)
Albumin: 3.8 g/dL (ref 3.5–5.0)
Alkaline Phosphatase: 93 U/L (ref 38–126)
Anion gap: 7 (ref 5–15)
BUN: 19 mg/dL (ref 6–20)
CO2: 29 mmol/L (ref 22–32)
Calcium: 9 mg/dL (ref 8.9–10.3)
Chloride: 104 mmol/L (ref 101–111)
Creatinine, Ser: 0.98 mg/dL (ref 0.61–1.24)
GFR calc Af Amer: >60 mL/min (ref >60)
GFR calc non Af Amer: >60 mL/min (ref >60)
Glucose, Bld: 116 mg/dL — ABNORMAL HIGH (ref 65–99)
Potassium: 4.3 mmol/L (ref 3.5–5.1)
Sodium: 140 mmol/L (ref 135–145)
Total Bilirubin: 0.7 mg/dL (ref 0.3–1.2)
Total Protein: 7.2 g/dL (ref 6.5–8.1)

## 2016-03-31 LAB — CBC WITH DIFFERENTIAL/PLATELET
Basophils Absolute: 0 10*3/uL (ref 0–0.1)
Basophils Relative: 1 %
Eosinophils Absolute: 0.2 10*3/uL (ref 0–0.7)
Eosinophils Relative: 5 %
HCT: 32.2 % — ABNORMAL LOW (ref 40.0–52.0)
Hemoglobin: 10.6 g/dL — ABNORMAL LOW (ref 13.0–18.0)
Lymphocytes Relative: 15 %
Lymphs Abs: 0.8 10*3/uL — ABNORMAL LOW (ref 1.0–3.6)
MCH: 28.3 pg (ref 26.0–34.0)
MCHC: 32.8 g/dL (ref 32.0–36.0)
MCV: 86.2 fL (ref 80.0–100.0)
Monocytes Absolute: 0.6 10*3/uL (ref 0.2–1.0)
Monocytes Relative: 12 %
Neutro Abs: 3.5 10*3/uL (ref 1.4–6.5)
Neutrophils Relative %: 67 %
Platelets: 182 10*3/uL (ref 150–440)
RBC: 3.74 MIL/uL — ABNORMAL LOW (ref 4.40–5.90)
RDW: 17.1 % — ABNORMAL HIGH (ref 11.5–14.5)
WBC: 5.1 10*3/uL (ref 3.8–10.6)

## 2016-03-31 LAB — MAGNESIUM: Magnesium: 2 mg/dL (ref 1.7–2.4)

## 2016-03-31 LAB — TSH: TSH: 2.015 u[IU]/mL (ref 0.350–4.500)

## 2016-03-31 MED ORDER — SODIUM CHLORIDE 0.9 % IV SOLN
240.0000 mg | Freq: Once | INTRAVENOUS | Status: AC
  Administered 2016-03-31: 240 mg via INTRAVENOUS
  Filled 2016-03-31: qty 20

## 2016-03-31 MED ORDER — SODIUM CHLORIDE 0.9 % IV SOLN
Freq: Once | INTRAVENOUS | Status: AC
  Administered 2016-03-31: 11:00:00 via INTRAVENOUS
  Filled 2016-03-31: qty 1000

## 2016-03-31 NOTE — Progress Notes (Signed)
[facility] day:  03/31/16   Chief Complaint: Marc Blake is a 60 y.o. male with metastatic renal cell carcinoma who is seen for assessment prior to cycle #5 nivolumab.  HPI: The patient was last seen in the medical oncology clinic on 03/17/2016.  At that time, he noted tingling in his arms when he was short of breath.  He had been prescribed oxygen.  He received cycle #4 nivolumab.  He received the influenza vaccine.  He had an appointment with [facility] on 04/05/2016.  He was told his hardware is failing. He has 2 options.  He can have the rod in his leg switched out to a larger rod.  Another possibility is a prosthetic femur including hip replacement.  Symptomatically, he feels wonderful.  He notes the swelling in his left leg has decreased.  His breathing is better. He denies any concerns   Past Medical History:  Diagnosis Date  . Acid reflux   . Anxiety   . Arthritis   . Depression   . Depression   . Hyperlipidemia   . Hypertension   . Myocardial infarction   . Renal cancer ([place])   . Renal cell carcinoma ([place])   . Skin cancer     Past Surgical History:  Procedure Laterality Date  . BACK SURGERY    . CARDIAC CATHETERIZATION     with stent  . CYSTOSCOPY WITH BIOPSY  03/06/2016   Procedure: CYSTOSCOPY WITH BIOPSY;  Surgeon: [name], MD;  Location: [facility] ORS;  Service: Urology;;  . [name] WITH URETHRAL DILATATION N/A 03/06/2016   Procedure: CYSTOSCOPY WITH URETHRAL DILATATION WITH CATHETER PLACEMENT;  Surgeon: [name], MD;  Location: [facility] OR[facility];  Service: Urology;  Laterality: N/A;  . KNEE SURGERY Left   . LEG SURGERY Left   . TONSILLECTOMY      Family History  Problem Relation Age of Onset  . Hypertension Father   . Heart attack Father   . Stroke Sister   . Hypertension Brother   . Stroke Maternal Uncle     Social History:  reports that he has never smoked. He has never used smokeless tobacco.  He reports that he does not drink alcohol or use drugs.  He is originally from [place].  He then lived in [place] for 17 years.  He moved to [place] with his wife.  The patient is alone today.  Allergies: No Known Allergies  Current Medications: Current Outpatient Prescriptions  Medication Sig Dispense Refill  . albuterol (PROVENTIL HFA;VENTOLIN HFA) 108 (90 Base) MCG/ACT inhaler Inhale into the lungs.    Marland Kitchen amLODipine (NORVASC) 2.5 MG tablet TAKE 1 TABLET ONCE A DAY FOR 30 DAYS  2  . atorvastatin (LIPITOR) 20 MG tablet TAKE 1 TABLET ONCE A DAY (AT BEDTIME) FOR 30 DAYS  2  . busPIRone (BUSPAR) 5 MG tablet TAKE 1 TABLET 3 TIMES A DAY FOR 30 DAYS  2  . carvedilol (COREG) 12.5 MG tablet TAKE 1 TABLET BY MOUTH TWICE A DAY FOR 30 DAYS  3  . fluconazole (DIFLUCAN) 100 MG tablet Take 1 tablet (100 mg total) by mouth daily. 7 tablet 0  . gabapentin (NEURONTIN) 300 MG capsule Take 300 mg by mouth 3 (three) times daily.     Marland Kitchen ibuprofen (ADVIL,MOTRIN) 200 MG tablet Take 200 mg by mouth every 8 (eight) hours as needed for moderate pain. 4 every 8 hours    . lisinopril (PRINIVIL,ZESTRIL) 20 MG tablet TAKE  1 TABLET ONCE A DAY FOR 30 DAYS  3  . morphine (MS CONTIN) 15 MG 12 hr tablet Take 15 mg by mouth every 12 (twelve) hours.  0  . nitrofurantoin (MACRODANTIN) 50 MG capsule TAKE 1 CAPSULE BY MOUTH AT BEDTIME FOR 30 DAYS  3  . Nivolumab (OPDIVO IV) Inject into the vein.    Marland Kitchen oxybutynin (DITROPAN) 5 MG tablet Take 1 tablet (5 mg total) by mouth every 8 (eight) hours as needed for bladder spasms. 30 tablet 0  . oxyCODONE (OXY IR/ROXICODONE) 5 MG immediate release tablet Take 5 mg by mouth every 8 (eight) hours as needed (patient takes 2 tabs in the evening only).     Marland Kitchen oxyCODONE (OXY IR/ROXICODONE) 5 MG immediate release tablet Take 1 tablet (5 mg total) by mouth every 4 (four) hours as needed for severe pain. 10 tablet 0  . senna (SENOKOT) 8.6 MG tablet Take 2 tablets by mouth at bedtime.     .  sertraline (ZOLOFT) 100 MG tablet Take 100 mg by mouth at bedtime.  1  . tamsulosin (FLOMAX) 0.4 MG CAPS capsule Take 0.8 mg by mouth at bedtime.    . traZODone (DESYREL) 50 MG tablet Take 50 mg by mouth at bedtime.   3  . triamcinolone cream (KENALOG) 0.5 % Apply topically.    Alveda Reasons 20 MG TABS tablet Take 20 mg by mouth daily.  1   No current facility-administered medications for this visit.     Review of Systems:  GENERAL:  Feels wonderful.  No fevers or sweats.  Weight down 12 pounds (fluid weight). PERFORMANCE STATUS (ECOG):  1 HEENT:  No visual changes, runny nose, sore throat, mouth sores or tenderness. Lungs:  No shortness of breath, cough or wheezing.  No hemoptysis. Cardiac:  No chest pain, palpitations, orthopnea, or PND. GI:  No nausea, vomiting, constipation, melena or hematochezia. GU:  Voiding well.  No urgency, frequency, dysuria, or hematuria. Musculoskeletal:  Femur issues (see HPI).  No muscle tenderness. Extremities:  Improvement in chronic left lower extremity swelling. Skin:  Eczema flare on elbows, improved with steroids. Neuro:  No headache, numbness or weakness.  Decreased feeling left leg (chronic). Endocrine:  No diabetes, thyroid issues, hot flashes or night sweats. Psych:  No mood changes, depression or anxiety. Pain:  No focal pain. Review of systems:  All other systems reviewed and found to be negative.  Physical Exam: Blood pressure 113/67, pulse 69, temperature 97.2 F (36.2 C), temperature source Tympanic, resp. rate 18, weight 248 lb 7.3 oz (112.7 kg). GENERAL:  Well developed, well nourished, gentleman sitting comfortably in the exam room in no acute distress.  He has a rolling walker at his side. MENTAL STATUS:  Alert and oriented to person, place and time. HEAD:  Long gray hair with goatee.  Normocephalic, atraumatic, face symmetric, no Cushingoid features. EYES:  Pupils equal round and reactive to light and accomodation.  No conjunctivitis or  scleral icterus. ENT:  Oropharynx clear without lesion.  Poor dentition.  Tongue normal. Mucous membranes moist.  RESPIRATORY:  Clear to auscultation without rales, wheezes or rhonchi. CARDIOVASCULAR:  Regular rate and rhythm without murmur, rub or gallop.  No JVD. ABDOMEN:  Soft, non-tender, with active bowel sounds, and no hepatosplenomegaly.  No masses. SKIN:  Tattoos. Eczema on dorsal surface of forearms (improved).  Patchy eczema right flank (stable).  EXTREMITIES: Chronic dense lower extremity edema, markedly improved (left > right).  No skin discoloration or tenderness.  No  palpable cords. LYMPH NODES: No palpable cervical, supraclavicular, axillary or inguinal adenopathy  NEUROLOGICAL:  Stable.  No change in ambulation. PSYCH:  Appropriate.  Marland Kitchen  Appointment on 03/31/2016  Component Date Value Ref Range Status  . WBC 03/31/2016 5.1  3.8 - 10.6 K/uL Final  . RBC 03/31/2016 3.74* 4.40 - 5.90 MIL/uL Final  . Hemoglobin 03/31/2016 10.6* 13.0 - 18.0 g/dL Final  . HCT 03/31/2016 32.2* 40.0 - 52.0 % Final  . MCV 03/31/2016 86.2  80.0 - 100.0 fL Final  . MCH 03/31/2016 28.3  26.0 - 34.0 pg Final  . MCHC 03/31/2016 32.8  32.0 - 36.0 g/dL Final  . RDW 03/31/2016 17.1* 11.5 - 14.5 % Final  . Platelets 03/31/2016 182  150 - 440 K/uL Final  . Neutrophils Relative % 03/31/2016 67  % Final  . Neutro Abs 03/31/2016 3.5  1.4 - 6.5 K/uL Final  . Lymphocytes Relative 03/31/2016 15  % Final  . Lymphs Abs 03/31/2016 0.8* 1.0 - 3.6 K/uL Final  . Monocytes Relative 03/31/2016 12  % Final  . Monocytes Absolute 03/31/2016 0.6  0.2 - 1.0 K/uL Final  . Eosinophils Relative 03/31/2016 5  % Final  . Eosinophils Absolute 03/31/2016 0.2  0 - 0.7 K/uL Final  . Basophils Relative 03/31/2016 1  % Final  . Basophils Absolute 03/31/2016 0.0  0 - 0.1 K/uL Final  . Sodium 03/31/2016 140  135 - 145 mmol/L Final  . Potassium 03/31/2016 4.3  3.5 - 5.1 mmol/L Final  . Chloride 03/31/2016 104  101 - 111 mmol/L Final   . CO2 03/31/2016 29  22 - 32 mmol/L Final  . Glucose, Bld 03/31/2016 116* 65 - 99 mg/dL Final  . BUN 03/31/2016 19  6 - 20 mg/dL Final  . Creatinine, Ser 03/31/2016 0.98  0.61 - 1.24 mg/dL Final  . Calcium 03/31/2016 9.0  8.9 - 10.3 mg/dL Final  . Total Protein 03/31/2016 7.2  6.5 - 8.1 g/dL Final  . Albumin 03/31/2016 3.8  3.5 - 5.0 g/dL Final  . AST 03/31/2016 16  15 - 41 U/L Final  . ALT 03/31/2016 12* 17 - 63 U/L Final  . Alkaline Phosphatase 03/31/2016 93  38 - 126 U/L Final  . Total Bilirubin 03/31/2016 0.7  0.3 - 1.2 mg/dL Final  . GFR calc non Af Amer 03/31/2016 >60  >60 mL/min Final  . GFR calc Af Amer 03/31/2016 >60  >60 mL/min Final   Comment: (NOTE) The eGFR has been calculated using the CKD EPI equation. This calculation has not been validated in all clinical situations. eGFR's persistently <60 mL/min signify possible Chronic Kidney Disease.   . Anion gap 03/31/2016 7  5 - 15 Final  . Magnesium 03/31/2016 2.0  1.7 - 2.4 mg/dL Final    Assessment:  Marc Blake is a 60 y.o. male with metastatic renal cell carcinoma presenting in 12/2013.  He had a large left renal mass, rib and vertebral body involvement, and multiple pulmonary nodules.  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    He was on Votrient (pazopanib) from 02/13/2014 - 01/21/2016.  He began nivolumab on 01/28/2016.  Abdominal and pelvic CT scan on 01/12/2014 revealed an 8.5 x 10.2 x 9.2 cm irregularly enhancing mass of the left kidney with some internal calcifications. There were enlarged left sided renal hilar lymph nodes. There were multiple pulmonary nodules in both lower lobes (largest 1.1 cm). There was a destructive  bone lesion with an irregular 8.6 x 5.1 cm soft tissue mass invasive of the spinal canal narrowing at least 50% and disturbing the pedicle and portions of the left transverse process rib and T8 vertebral body.   Chest CT on 01/15/2014 revealed  innumerable pulmonary nodules, a large metastatic lesion at T8 and left eighth rib with invasion into the spinal canal and moderate canal stenosis. There was a left upper pole right kidney mass.  Head MRI was negative.  He received 3000 cGy to T7 - T9 and associated ribs beginning 05/11/2014.  On 06/15/2014, he received 1 dose of nivolumab.  He presented with progressive paralysis from the waist down on 07/06/2014.  He underwent left posterolateral thoracotomy with left chest wall resection with T8 corpectomy and interbody fusion with donor bone on 07/06/2014.  Pathology revealed metastatic renal cell carcinoma. Post-operatively, he developed sepsis secondary to a submandibular abscess dental abscess.  He underwent tracheostomy, I&D of a right neck abscess, tooth extraction, and PEG tube placement.  Zometa has subsequently been on hold.  He developed a pathologic fracture of the left femur.  He is s/p intramedullary nailing on 01/27/2015.  He received radiation to the left leg.  He was admitted on 04/26/2015 with aspiration pneumonia and opioid overdose. He is currently taking MSContin 15 mg BID and oxycodone 5 mg q day.  Chest, abdomen, and pelvic CT scan on 05/31/2015 revealed slight enlargement of multiple pulmonary nodules.  The left renal mass was similar (12.2 x 9.6 cm).  The right kidney lesion was stable (lower pole lesion 2 x 1.7 cm).  Bone scan on 05/31/2015 revealed slight increased activity in the left costal vertebral junction at T7.  Chest, abdomen, and pelvic CT scan on 12/14/2015 revealed an irregular 10.7 cm heterogeneous renal cortical mass in the upper left kidney and a heterogeneous 2.3 cm renal cortical mass in the lateral lower right kidney.  There was mild bilateral hydroureteronephrosis with bladder distention.  There was mild left para-aortic lymphadenopathy.  There were numerous (greater than 20) pulmonary metastases.  Largest nodule was 2.1 cm in the RLL. There was a partially  visualized mildly expansile lytic lesion in the left proximal femur status post surgical transfixation.  Thre was a small probably loculated pleural effusion at the left eighth rib resection site.  There were no other bone abnormalities.  Bone scan on 12/23/2015 revealed photopenia from prior removal of the left eighth rib. There was increased  uptake in the medial thoracic spine from T7 and T9 is felt to be due to postoperative fusion.  There was increased uptake throughout much of the left femur as well as increased uptake in the soft tissues of much of the left femur which may be due to previous surgery and radiation therapy change.  He developed a left lower extremity DVT (popliteal and soleal vein) on 06/07/2015.  Left lower extremity ultrasound on 02/07/2016 revealed no evidence of DVT.  He is on long term Xarelto.  Patient has a long standing history of an obstructive uropathy and elevated PVR.  He has undergone urethral dilatation on several occasions.  He underwent cystoscopy, balloon urethral dilatation, and bladder biopsy with Foley catheter placement on 03/06/2016.  Bladder biopsy revealed cystitis with reactive changes, negative for atypia and malignancy.  Foley catheter remains in place.  He is s/p 4 cycles of nivolumab (01/28/2016 - 03/17/2016).  He tolerated his infusions well.  TSH was 6.239 (0.35-4.50) with a normal free T4 (0.79) on 02/25/2016.  He  was admitted to [facility] from 02/14/2016 - 02/16/2016 with chest pain.  Chest CT angiogram on 02/15/2016 ruled out pulmonary embolism. Imaging revealed slight progression of mediastinal left hilar adenopathy was pulmonary metastatic disease. Stress test on 02/15/2016 ruled out cardiac ischemia.  He was treated with Augmentin for presumed lower extremity cellulitis.  Chest CT on 03/10/2016 revealed no evidence of pneumonia or pneumonitis.  There was left mild atelectasis small effusion similar to prior.  There were bilateral multiple metastasis  (stable).   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  There is possible increase in change in the lytic lesion with some movement at the fracture site.  He has broken the distal locking screw.  He ambulates with a rolling walker.  He has been referred to [place].  He has restrictive lung disease secondary to his weight and prior radiation.  He has been prescribed oxygen (not received).    His psoriasis flared on nivolumab, but has improved with topical steroids.  He is postponing dental assessment until after 06/19/2016 Aaron Edelman on hold).  Symptomatically, feels good.  He is breathing better. He has decreased swelling in the left lower extremity.  Plan: 1.  Labs today:  CBC with diff, CMP, TSH. 2.  Cycle #5 nivolumab. 3.  Phone follow-up with [facility]. 4.  Continue topical steroids for eczema. 5.  Discuss plans for restaging scans after next cycle. 6.  RTC in 2 weeks for MD assessment, labs (CBC with diff, CMP, LDH, TSH), and cycle #6 nivolumab.   [name], MD  03/31/2016 , 10:20 AM

## 2016-03-31 NOTE — Progress Notes (Signed)
Patient states he is feeling pretty well today.  States he does not have "zip".  (No strength or energy).

## 2016-04-01 ENCOUNTER — Encounter: Payer: Self-pay | Admitting: Hematology and Oncology

## 2016-04-03 ENCOUNTER — Other Ambulatory Visit: Payer: Self-pay | Admitting: Hematology and Oncology

## 2016-04-03 ENCOUNTER — Inpatient Hospital Stay
Admission: RE | Admit: 2016-04-03 | Discharge: 2016-04-03 | Disposition: A | Discharge status: Home / Self Care | Payer: Self-pay | Source: Ambulatory Visit | Attending: Hematology and Oncology | Admitting: Hematology and Oncology

## 2016-04-03 DIAGNOSIS — C649 Malignant neoplasm of unspecified kidney, except renal pelvis: Secondary | ICD-10-CM

## 2016-04-14 ENCOUNTER — Other Ambulatory Visit: Payer: Self-pay | Admitting: *Deleted

## 2016-04-14 ENCOUNTER — Inpatient Hospital Stay: Payer: 59

## 2016-04-14 ENCOUNTER — Inpatient Hospital Stay (HOSPITAL_BASED_OUTPATIENT_CLINIC_OR_DEPARTMENT_OTHER): Payer: 59 | Admitting: Hematology and Oncology

## 2016-04-14 VITALS — BP 123/75 | HR 64 | Temp 97.1°F | Resp 18 | Wt 243.4 lb

## 2016-04-14 DIAGNOSIS — Z86718 Personal history of other venous thrombosis and embolism: Secondary | ICD-10-CM

## 2016-04-14 DIAGNOSIS — Z7901 Long term (current) use of anticoagulants: Secondary | ICD-10-CM

## 2016-04-14 DIAGNOSIS — R0602 Shortness of breath: Secondary | ICD-10-CM

## 2016-04-14 DIAGNOSIS — C642 Malignant neoplasm of left kidney, except renal pelvis: Secondary | ICD-10-CM

## 2016-04-14 DIAGNOSIS — I10 Essential (primary) hypertension: Secondary | ICD-10-CM

## 2016-04-14 DIAGNOSIS — C7800 Secondary malignant neoplasm of unspecified lung: Secondary | ICD-10-CM

## 2016-04-14 DIAGNOSIS — Z8781 Personal history of (healed) traumatic fracture: Secondary | ICD-10-CM

## 2016-04-14 DIAGNOSIS — N3289 Other specified disorders of bladder: Secondary | ICD-10-CM

## 2016-04-14 DIAGNOSIS — J984 Other disorders of lung: Secondary | ICD-10-CM

## 2016-04-14 DIAGNOSIS — Z923 Personal history of irradiation: Secondary | ICD-10-CM

## 2016-04-14 DIAGNOSIS — C7951 Secondary malignant neoplasm of bone: Secondary | ICD-10-CM

## 2016-04-14 DIAGNOSIS — E785 Hyperlipidemia, unspecified: Secondary | ICD-10-CM

## 2016-04-14 DIAGNOSIS — K219 Gastro-esophageal reflux disease without esophagitis: Secondary | ICD-10-CM

## 2016-04-14 DIAGNOSIS — F329 Major depressive disorder, single episode, unspecified: Secondary | ICD-10-CM

## 2016-04-14 DIAGNOSIS — I252 Old myocardial infarction: Secondary | ICD-10-CM

## 2016-04-14 DIAGNOSIS — F419 Anxiety disorder, unspecified: Secondary | ICD-10-CM

## 2016-04-14 DIAGNOSIS — Z85828 Personal history of other malignant neoplasm of skin: Secondary | ICD-10-CM

## 2016-04-14 DIAGNOSIS — Z79899 Other long term (current) drug therapy: Secondary | ICD-10-CM

## 2016-04-14 DIAGNOSIS — L309 Dermatitis, unspecified: Secondary | ICD-10-CM

## 2016-04-14 DIAGNOSIS — M129 Arthropathy, unspecified: Secondary | ICD-10-CM

## 2016-04-14 LAB — COMPREHENSIVE METABOLIC PANEL
ALT: 13 U/L — ABNORMAL LOW (ref 17–63)
AST: 18 U/L (ref 15–41)
Albumin: 4.3 g/dL (ref 3.5–5.0)
Alkaline Phosphatase: 92 U/L (ref 38–126)
Anion gap: 9 (ref 5–15)
BUN: 22 mg/dL — ABNORMAL HIGH (ref 6–20)
CO2: 28 mmol/L (ref 22–32)
Calcium: 9.3 mg/dL (ref 8.9–10.3)
Chloride: 101 mmol/L (ref 101–111)
Creatinine, Ser: 1.05 mg/dL (ref 0.61–1.24)
GFR calc Af Amer: >60 mL/min (ref >60)
GFR calc non Af Amer: >60 mL/min (ref >60)
Glucose, Bld: 114 mg/dL — ABNORMAL HIGH (ref 65–99)
Potassium: 3.8 mmol/L (ref 3.5–5.1)
Sodium: 138 mmol/L (ref 135–145)
Total Bilirubin: 0.5 mg/dL (ref 0.3–1.2)
Total Protein: 7.8 g/dL (ref 6.5–8.1)

## 2016-04-14 LAB — CBC WITH DIFFERENTIAL/PLATELET
Basophils Absolute: 0.1 10*3/uL (ref 0–0.1)
Basophils Relative: 1 %
Eosinophils Absolute: 0.2 10*3/uL (ref 0–0.7)
Eosinophils Relative: 3 %
HCT: 34.6 % — ABNORMAL LOW (ref 40.0–52.0)
Hemoglobin: 11.3 g/dL — ABNORMAL LOW (ref 13.0–18.0)
Lymphocytes Relative: 14 %
Lymphs Abs: 0.9 10*3/uL — ABNORMAL LOW (ref 1.0–3.6)
MCH: 27.4 pg (ref 26.0–34.0)
MCHC: 32.6 g/dL (ref 32.0–36.0)
MCV: 84 fL (ref 80.0–100.0)
Monocytes Absolute: 0.7 10*3/uL (ref 0.2–1.0)
Monocytes Relative: 10 %
Neutro Abs: 4.8 10*3/uL (ref 1.4–6.5)
Neutrophils Relative %: 72 %
Platelets: 205 10*3/uL (ref 150–440)
RBC: 4.12 MIL/uL — ABNORMAL LOW (ref 4.40–5.90)
RDW: 17 % — ABNORMAL HIGH (ref 11.5–14.5)
WBC: 6.7 10*3/uL (ref 3.8–10.6)

## 2016-04-14 LAB — TSH: TSH: 4.194 u[IU]/mL (ref 0.350–4.500)

## 2016-04-14 MED ORDER — SODIUM CHLORIDE 0.9 % IV SOLN
Freq: Once | INTRAVENOUS | Status: AC
  Administered 2016-04-14: 10:00:00 via INTRAVENOUS
  Filled 2016-04-14: qty 1000

## 2016-04-14 MED ORDER — SODIUM CHLORIDE 0.9% FLUSH
10.0000 mL | INTRAVENOUS | Status: DC | PRN
  Filled 2016-04-14: qty 10

## 2016-04-14 MED ORDER — HEPARIN SOD (PORK) LOCK FLUSH 100 UNIT/ML IV SOLN: 500.0000 [IU] | Freq: Once | INTRAVENOUS | Status: DC | PRN

## 2016-04-14 MED ORDER — SODIUM CHLORIDE 0.9 % IV SOLN
240.0000 mg | Freq: Once | INTRAVENOUS | Status: AC
  Administered 2016-04-14: 240 mg via INTRAVENOUS
  Filled 2016-04-14: qty 20

## 2016-04-14 NOTE — Progress Notes (Signed)
[facility] day:  04/14/16   Chief Complaint: Marc Blake is a 60 y.o. male with metastatic renal cell carcinoma who is seen for assessment prior to cycle #6 nivolumab.  HPI: The patient was last seen in the medical oncology clinic on 03/31/2016.  At that time, he felt wonderful.  Swelling in his left leg had decreased.  His breathing was better. He denied any concerns.   Symptomatically, he notes a "drab week".  He describes not being strong and having no 0 blood. He did not go to work for 3 days. He denies any specific symptoms. He continues to have no edema in his left leg.  He denies any respiratory symptoms.  He denies any nausea vomiting or diarrhea.  He has decided to have surgery at [facility] on his left leg.   Past Medical History:  Diagnosis Date  . Acid reflux   . Anxiety   . Arthritis   . Depression   . Depression   . Hyperlipidemia   . Hypertension   . Myocardial infarction   . Renal cancer ([place])   . Renal cell carcinoma ([place])   . Skin cancer     Past Surgical History:  Procedure Laterality Date  . BACK SURGERY    . CARDIAC CATHETERIZATION     with stent  . CYSTOSCOPY WITH BIOPSY  03/06/2016   Procedure: CYSTOSCOPY WITH BIOPSY;  Surgeon: [name], MD;  Location: [facility] ORS;  Service: Urology;;  . [name] WITH URETHRAL DILATATION N/A 03/06/2016   Procedure: CYSTOSCOPY WITH URETHRAL DILATATION WITH CATHETER PLACEMENT;  Surgeon: [name], MD;  Location: [facility] ORS;  Service: Urology;  Laterality: N/A;  . KNEE SURGERY Left   . LEG SURGERY Left   . TONSILLECTOMY      Family History  Problem Relation Age of Onset  . Hypertension Father   . Heart attack Father   . Stroke Sister   . Hypertension Brother   . Stroke Maternal Uncle     Social History:  reports that he has never smoked. He has never used smokeless tobacco. He reports that he does not drink alcohol or use drugs.  He is originally from  [place].  He then lived in [place] for 17 years.  He moved to [place] with his wife.  The patient is alone today.  Allergies: No Known Allergies  Current Medications: Current Outpatient Prescriptions  Medication Sig Dispense Refill  . amLODipine (NORVASC) 2.5 MG tablet TAKE 1 TABLET ONCE A DAY FOR 30 DAYS  2  . atorvastatin (LIPITOR) 20 MG tablet TAKE 1 TABLET ONCE A DAY (AT BEDTIME) FOR 30 DAYS  2  . busPIRone (BUSPAR) 5 MG tablet TAKE 1 TABLET 3 TIMES A DAY FOR 30 DAYS  2  . carvedilol (COREG) 12.5 MG tablet TAKE 1 TABLET BY MOUTH TWICE A DAY FOR 30 DAYS  3  . gabapentin (NEURONTIN) 300 MG capsule Take 300 mg by mouth 3 (three) times daily.     Marland Kitchen ibuprofen (ADVIL,MOTRIN) 200 MG tablet Take 200 mg by mouth every 8 (eight) hours as needed for moderate pain. 4 every 8 hours    . lisinopril (PRINIVIL,ZESTRIL) 20 MG tablet TAKE 1 TABLET ONCE A DAY FOR 30 DAYS  3  . morphine (MS CONTIN) 15 MG 12 hr tablet Take 15 mg by mouth every 12 (twelve) hours.  0  . Nivolumab (OPDIVO IV) Inject into the vein.    Marland Kitchen oxyCODONE (OXY IR/ROXICODONE) 5  MG immediate release tablet Take 5 mg by mouth every 8 (eight) hours as needed (patient takes 2 tabs in the evening only).     Marland Kitchen senna (SENOKOT) 8.6 MG tablet Take 2 tablets by mouth at bedtime.     . sertraline (ZOLOFT) 100 MG tablet Take 100 mg by mouth at bedtime.  1  . tamsulosin (FLOMAX) 0.4 MG CAPS capsule Take 0.8 mg by mouth at bedtime.    . traZODone (DESYREL) 50 MG tablet Take 50 mg by mouth at bedtime.   3  . triamcinolone cream (KENALOG) 0.5 % Apply topically.    Alveda Reasons 20 MG TABS tablet Take 20 mg by mouth daily.  1   No current facility-administered medications for this visit.     Review of Systems:  GENERAL:  No zip this week.  No fevers or sweats.  Weight down 5 pounds. PERFORMANCE STATUS (ECOG):  1 HEENT:  No visual changes, runny nose, sore throat, mouth sores or tenderness.  Hasn't decided about fixing teeth. Lungs:  No shortness  of breath, cough or wheezing.  No hemoptysis. Cardiac:  No chest pain, palpitations, orthopnea, or PND. GI:  Decreased appetite (75% of normal).  No nausea, vomiting, constipation, melena or hematochezia. GU:  Voiding well.  No urgency, frequency, dysuria, or hematuria. Musculoskeletal:  Femur issues (plans to have surgery at [facility]).  No muscle tenderness. Extremities:  Improvement in chronic left lower extremity swelling. Skin:  Eczema on elbows, improved with steroids. Neuro:  No headache, numbness or weakness.  Decreased feeling left leg (chronic). Endocrine:  No diabetes, thyroid issues, hot flashes or night sweats. Psych:  No mood changes, depression or anxiety. Pain:  No focal pain. Review of systems:  All other systems reviewed and found to be negative.  Physical Exam: Blood pressure 123/75, pulse 64, temperature 97.1 F (36.2 C), temperature source Tympanic, resp. rate 18, weight 243 lb 6.2 oz (110.4 kg). GENERAL:  Well developed, well nourished, gentleman sitting comfortably in the exam room in no acute distress.  He has a rolling walker at his side. MENTAL STATUS:  Alert and oriented to person, place and time. HEAD:  Long gray hair with goatee.  Normocephalic, atraumatic, face symmetric, no Cushingoid features. EYES:  Pupils equal round and reactive to light and accomodation.  No conjunctivitis or scleral icterus. ENT:  Oropharynx clear without lesion.  Poor dentition.  Tongue normal. Mucous membranes moist.  RESPIRATORY:  Clear to auscultation without rales, wheezes or rhonchi. CARDIOVASCULAR:  Regular rate and rhythm without murmur, rub or gallop.  No JVD. ABDOMEN:  Soft, non-tender, with active bowel sounds, and no hepatosplenomegaly.  No masses. SKIN:  Tattoos. Eczema on dorsal surface of forearms (stable).  Patchy eczema right flank (improved).  EXTREMITIES: Minimal left lower extremity edema.  No skin discoloration or tenderness.  No palpable cords. LYMPH NODES: No palpable  cervical, supraclavicular, axillary or inguinal adenopathy  NEUROLOGICAL:  Stable.  No change in ambulation. PSYCH:  Appropriate.  Marland Kitchen  Appointment on 04/14/2016  Component Date Value Ref Range Status  . WBC 04/14/2016 6.7  3.8 - 10.6 K/uL Final  . RBC 04/14/2016 4.12* 4.40 - 5.90 MIL/uL Final  . Hemoglobin 04/14/2016 11.3* 13.0 - 18.0 g/dL Final  . HCT 04/14/2016 34.6* 40.0 - 52.0 % Final  . MCV 04/14/2016 84.0  80.0 - 100.0 fL Final  . MCH 04/14/2016 27.4  26.0 - 34.0 pg Final  . MCHC 04/14/2016 32.6  32.0 - 36.0 g/dL Final  . RDW  04/14/2016 17.0* 11.5 - 14.5 % Final  . Platelets 04/14/2016 205  150 - 440 K/uL Final  . Neutrophils Relative % 04/14/2016 72  % Final  . Neutro Abs 04/14/2016 4.8  1.4 - 6.5 K/uL Final  . Lymphocytes Relative 04/14/2016 14  % Final  . Lymphs Abs 04/14/2016 0.9* 1.0 - 3.6 K/uL Final  . Monocytes Relative 04/14/2016 10  % Final  . Monocytes Absolute 04/14/2016 0.7  0.2 - 1.0 K/uL Final  . Eosinophils Relative 04/14/2016 3  % Final  . Eosinophils Absolute 04/14/2016 0.2  0 - 0.7 K/uL Final  . Basophils Relative 04/14/2016 1  % Final  . Basophils Absolute 04/14/2016 0.1  0 - 0.1 K/uL Final  . Sodium 04/14/2016 138  135 - 145 mmol/L Final  . Potassium 04/14/2016 3.8  3.5 - 5.1 mmol/L Final  . Chloride 04/14/2016 101  101 - 111 mmol/L Final  . CO2 04/14/2016 28  22 - 32 mmol/L Final  . Glucose, Bld 04/14/2016 114* 65 - 99 mg/dL Final  . BUN 04/14/2016 22* 6 - 20 mg/dL Final  . Creatinine, Ser 04/14/2016 1.05  0.61 - 1.24 mg/dL Final  . Calcium 04/14/2016 9.3  8.9 - 10.3 mg/dL Final  . Total Protein 04/14/2016 7.8  6.5 - 8.1 g/dL Final  . Albumin 04/14/2016 4.3  3.5 - 5.0 g/dL Final  . AST 04/14/2016 18  15 - 41 U/L Final  . ALT 04/14/2016 13* 17 - 63 U/L Final  . Alkaline Phosphatase 04/14/2016 92  38 - 126 U/L Final  . Total Bilirubin 04/14/2016 0.5  0.3 - 1.2 mg/dL Final  . GFR calc non Af Amer 04/14/2016 >60  >60 mL/min Final  . GFR calc Af Amer  04/14/2016 >60  >60 mL/min Final   Comment: (NOTE) The eGFR has been calculated using the CKD EPI equation. This calculation has not been validated in all clinical situations. eGFR's persistently <60 mL/min signify possible Chronic Kidney Disease.   . Anion gap 04/14/2016 9  5 - 15 Final    Assessment:  Marc Blake is a 60 y.o. male with metastatic renal cell carcinoma presenting in 12/2013.  He had a large left renal mass, rib and vertebral body involvement, and multiple pulmonary nodules.  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    He was on Votrient (pazopanib) from 02/13/2014 - 01/21/2016.  He began nivolumab on 01/28/2016.  Abdominal and pelvic CT scan on 01/12/2014 revealed an 8.5 x 10.2 x 9.2 cm irregularly enhancing mass of the left kidney with some internal calcifications. There were enlarged left sided renal hilar lymph nodes. There were multiple pulmonary nodules in both lower lobes (largest 1.1 cm). There was a destructive bone lesion with an irregular 8.6 x 5.1 cm soft tissue mass invasive of the spinal canal narrowing at least 50% and disturbing the pedicle and portions of the left transverse process rib and T8 vertebral body.   Chest CT on 01/15/2014 revealed innumerable pulmonary nodules, a large metastatic lesion at T8 and left eighth rib with invasion into the spinal canal and moderate canal stenosis. There was a left upper pole right kidney mass.  Head MRI was negative.  He received 3000 cGy to T7 - T9 and associated ribs beginning 05/11/2014.  On 06/15/2014, he received 1 dose of nivolumab.  He presented with progressive paralysis from the waist down on 07/06/2014.  He underwent left posterolateral thoracotomy with left chest wall  resection with T8 corpectomy and interbody fusion with donor bone on 07/06/2014.  Pathology revealed metastatic renal cell carcinoma. Post-operatively, he developed sepsis secondary to a  submandibular abscess dental abscess.  He underwent tracheostomy, I&D of a right neck abscess, tooth extraction, and PEG tube placement.  Zometa has subsequently been on hold.  He developed a pathologic fracture of the left femur.  He is s/p intramedullary nailing on 01/27/2015.  He received radiation to the left leg.  He was admitted on 04/26/2015 with aspiration pneumonia and opioid overdose. He is currently taking MSContin 15 mg BID and oxycodone 5 mg q day.  Chest, abdomen, and pelvic CT scan on 05/31/2015 revealed slight enlargement of multiple pulmonary nodules.  The left renal mass was similar (12.2 x 9.6 cm).  The right kidney lesion was stable (lower pole lesion 2 x 1.7 cm).  Bone scan on 05/31/2015 revealed slight increased activity in the left costal vertebral junction at T7.  Chest, abdomen, and pelvic CT scan on 12/14/2015 revealed an irregular 10.7 cm heterogeneous renal cortical mass in the upper left kidney and a heterogeneous 2.3 cm renal cortical mass in the lateral lower right kidney.  There was mild bilateral hydroureteronephrosis with bladder distention.  There was mild left para-aortic lymphadenopathy.  There were numerous (greater than 20) pulmonary metastases.  Largest nodule was 2.1 cm in the RLL. There was a partially visualized mildly expansile lytic lesion in the left proximal femur status post surgical transfixation.  Thre was a small probably loculated pleural effusion at the left eighth rib resection site.  There were no other bone abnormalities.  Bone scan on 12/23/2015 revealed photopenia from prior removal of the left eighth rib. There was increased  uptake in the medial thoracic spine from T7 and T9 is felt to be due to postoperative fusion.  There was increased uptake throughout much of the left femur as well as increased uptake in the soft tissues of much of the left femur which may be due to previous surgery and radiation therapy change.  He developed a left lower  extremity DVT (popliteal and soleal vein) on 06/07/2015.  Left lower extremity ultrasound on 02/07/2016 revealed no evidence of DVT.  He is on long term Xarelto.  Patient has a long standing history of an obstructive uropathy and elevated PVR.  He has undergone urethral dilatation on several occasions.  He underwent cystoscopy, balloon urethral dilatation, and bladder biopsy with Foley catheter placement on 03/06/2016.  Bladder biopsy revealed cystitis with reactive changes, negative for atypia and malignancy.  Foley catheter remains in place.  He is s/p 5 cycles of nivolumab (01/28/2016 - 03/31/2016).  He tolerated his infusions well.  TSH was 6.239 (0.35-4.50) with a normal free T4 (0.79) on 02/25/2016.  He was admitted to [facility] from 02/14/2016 - 02/16/2016 with chest pain.  Chest CT angiogram on 02/15/2016 ruled out pulmonary embolism. Imaging revealed slight progression of mediastinal left hilar adenopathy was pulmonary metastatic disease. Stress test on 02/15/2016 ruled out cardiac ischemia.  He was treated with Augmentin for presumed lower extremity cellulitis.  Chest CT on 03/10/2016 revealed no evidence of pneumonia or pneumonitis.  There was left mild atelectasis small effusion similar to prior.  There were bilateral multiple metastasis (stable).   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  There is possible increase in change in the lytic lesion with some movement at the fracture site.  He has broken the distal locking screw.  He ambulates with  a rolling walker.  He has been referred to [place].  He has restrictive lung disease secondary to his weight and prior radiation.  He has been prescribed oxygen (not received).    His psoriasis flared on nivolumab, but has improved with topical steroids.  He is postponing dental assessment until after 06/19/2016 Aaron Edelman on hold).  Symptomatically, feels good.  He is breathing better. He has decreased swelling in the left lower  extremity.  Plan: 1.  Labs today:  CBC with diff, CMP, TSH. 2.  Cycle #6 nivolumab. 3.  Schedule chest, abdomen, and pelvic CT scan on 04/26/2016 4.  Coordinate care with [facility]. 5.  RTC on 04/28/2016 for MD assessment, labs (CBC with diff, CMP, LDH), and cycle #7 nivolumab.   [name], MD  04/14/2016 , 9:20 AM

## 2016-04-16 ENCOUNTER — Encounter: Payer: Self-pay | Admitting: Hematology and Oncology

## 2016-04-18 LAB — LEUKOCYTE ALKALINE PHOSPHATASE: Leukocyte Alkaline  Phos Stain: 81 (ref 25–130)

## 2016-04-26 ENCOUNTER — Ambulatory Visit
Admission: RE | Admit: 2016-04-26 | Discharge: 2016-04-26 | Disposition: A | Discharge status: Home / Self Care | Payer: 59 | Source: Ambulatory Visit | Attending: Hematology and Oncology | Admitting: Hematology and Oncology

## 2016-04-26 DIAGNOSIS — C7951 Secondary malignant neoplasm of bone: Secondary | ICD-10-CM | POA: Diagnosis not present

## 2016-04-26 DIAGNOSIS — C642 Malignant neoplasm of left kidney, except renal pelvis: Secondary | ICD-10-CM | POA: Insufficient documentation

## 2016-04-26 DIAGNOSIS — I700 Atherosclerosis of aorta: Secondary | ICD-10-CM | POA: Diagnosis not present

## 2016-04-26 DIAGNOSIS — K429 Umbilical hernia without obstruction or gangrene: Secondary | ICD-10-CM | POA: Insufficient documentation

## 2016-04-26 DIAGNOSIS — C7800 Secondary malignant neoplasm of unspecified lung: Secondary | ICD-10-CM | POA: Diagnosis not present

## 2016-04-26 DIAGNOSIS — N329 Bladder disorder, unspecified: Secondary | ICD-10-CM | POA: Insufficient documentation

## 2016-04-26 HISTORY — DX: Heart failure, unspecified: I50.9

## 2016-04-26 MED ORDER — IOPAMIDOL (ISOVUE-300) INJECTION 61%
100.0000 mL | Freq: Once | INTRAVENOUS | Status: AC | PRN
  Administered 2016-04-26: 100 mL via INTRAVENOUS

## 2016-04-28 ENCOUNTER — Other Ambulatory Visit: Payer: Self-pay | Admitting: Hematology and Oncology

## 2016-04-28 ENCOUNTER — Inpatient Hospital Stay (HOSPITAL_BASED_OUTPATIENT_CLINIC_OR_DEPARTMENT_OTHER): Payer: 59 | Admitting: Hematology and Oncology

## 2016-04-28 ENCOUNTER — Inpatient Hospital Stay: Payer: 59

## 2016-04-28 ENCOUNTER — Inpatient Hospital Stay: Payer: 59 | Attending: Hematology and Oncology

## 2016-04-28 VITALS — BP 103/64 | HR 62 | Temp 95.9°F | Resp 18 | Wt 256.0 lb

## 2016-04-28 DIAGNOSIS — F329 Major depressive disorder, single episode, unspecified: Secondary | ICD-10-CM

## 2016-04-28 DIAGNOSIS — R609 Edema, unspecified: Secondary | ICD-10-CM | POA: Diagnosis not present

## 2016-04-28 DIAGNOSIS — Z86718 Personal history of other venous thrombosis and embolism: Secondary | ICD-10-CM | POA: Diagnosis not present

## 2016-04-28 DIAGNOSIS — C7800 Secondary malignant neoplasm of unspecified lung: Secondary | ICD-10-CM | POA: Diagnosis not present

## 2016-04-28 DIAGNOSIS — F419 Anxiety disorder, unspecified: Secondary | ICD-10-CM | POA: Insufficient documentation

## 2016-04-28 DIAGNOSIS — N139 Obstructive and reflux uropathy, unspecified: Secondary | ICD-10-CM | POA: Insufficient documentation

## 2016-04-28 DIAGNOSIS — C642 Malignant neoplasm of left kidney, except renal pelvis: Secondary | ICD-10-CM

## 2016-04-28 DIAGNOSIS — Z8781 Personal history of (healed) traumatic fracture: Secondary | ICD-10-CM

## 2016-04-28 DIAGNOSIS — Z923 Personal history of irradiation: Secondary | ICD-10-CM

## 2016-04-28 DIAGNOSIS — Z79899 Other long term (current) drug therapy: Secondary | ICD-10-CM | POA: Insufficient documentation

## 2016-04-28 DIAGNOSIS — K219 Gastro-esophageal reflux disease without esophagitis: Secondary | ICD-10-CM

## 2016-04-28 DIAGNOSIS — Z7901 Long term (current) use of anticoagulants: Secondary | ICD-10-CM

## 2016-04-28 DIAGNOSIS — Z5112 Encounter for antineoplastic immunotherapy: Secondary | ICD-10-CM | POA: Diagnosis not present

## 2016-04-28 DIAGNOSIS — E785 Hyperlipidemia, unspecified: Secondary | ICD-10-CM | POA: Diagnosis not present

## 2016-04-28 DIAGNOSIS — I10 Essential (primary) hypertension: Secondary | ICD-10-CM

## 2016-04-28 DIAGNOSIS — I509 Heart failure, unspecified: Secondary | ICD-10-CM | POA: Insufficient documentation

## 2016-04-28 DIAGNOSIS — C7951 Secondary malignant neoplasm of bone: Secondary | ICD-10-CM

## 2016-04-28 DIAGNOSIS — M129 Arthropathy, unspecified: Secondary | ICD-10-CM | POA: Insufficient documentation

## 2016-04-28 DIAGNOSIS — I252 Old myocardial infarction: Secondary | ICD-10-CM | POA: Diagnosis not present

## 2016-04-28 DIAGNOSIS — Z85828 Personal history of other malignant neoplasm of skin: Secondary | ICD-10-CM

## 2016-04-28 LAB — CBC WITH DIFFERENTIAL/PLATELET
Basophils Absolute: 0 10*3/uL (ref 0–0.1)
Basophils Relative: 1 %
Eosinophils Absolute: 0.2 10*3/uL (ref 0–0.7)
Eosinophils Relative: 3 %
HCT: 34.6 % — ABNORMAL LOW (ref 40.0–52.0)
Hemoglobin: 11.1 g/dL — ABNORMAL LOW (ref 13.0–18.0)
Lymphocytes Relative: 17 %
Lymphs Abs: 1 10*3/uL (ref 1.0–3.6)
MCH: 26.8 pg (ref 26.0–34.0)
MCHC: 32.2 g/dL (ref 32.0–36.0)
MCV: 83.2 fL (ref 80.0–100.0)
Monocytes Absolute: 0.7 10*3/uL (ref 0.2–1.0)
Monocytes Relative: 12 %
Neutro Abs: 4.3 10*3/uL (ref 1.4–6.5)
Neutrophils Relative %: 67 %
Platelets: 191 10*3/uL (ref 150–440)
RBC: 4.16 MIL/uL — ABNORMAL LOW (ref 4.40–5.90)
RDW: 16.7 % — ABNORMAL HIGH (ref 11.5–14.5)
WBC: 6.3 10*3/uL (ref 3.8–10.6)

## 2016-04-28 LAB — LACTATE DEHYDROGENASE: LDH: 123 U/L (ref 98–192)

## 2016-04-28 LAB — COMPREHENSIVE METABOLIC PANEL
ALT: 11 U/L — ABNORMAL LOW (ref 17–63)
AST: 16 U/L (ref 15–41)
Albumin: 3.9 g/dL (ref 3.5–5.0)
Alkaline Phosphatase: 88 U/L (ref 38–126)
Anion gap: 7 (ref 5–15)
BUN: 17 mg/dL (ref 6–20)
CO2: 30 mmol/L (ref 22–32)
Calcium: 9.1 mg/dL (ref 8.9–10.3)
Chloride: 103 mmol/L (ref 101–111)
Creatinine, Ser: 1.14 mg/dL (ref 0.61–1.24)
GFR calc Af Amer: >60 mL/min (ref >60)
GFR calc non Af Amer: >60 mL/min (ref >60)
Glucose, Bld: 123 mg/dL — ABNORMAL HIGH (ref 65–99)
Potassium: 3.9 mmol/L (ref 3.5–5.1)
Sodium: 140 mmol/L (ref 135–145)
Total Bilirubin: 0.5 mg/dL (ref 0.3–1.2)
Total Protein: 7.3 g/dL (ref 6.5–8.1)

## 2016-04-28 LAB — T4, FREE: Free T4: 0.71 ng/dL (ref 0.61–1.12)

## 2016-04-28 LAB — MAGNESIUM: Magnesium: 2 mg/dL (ref 1.7–2.4)

## 2016-04-28 LAB — TSH: TSH: 5.423 u[IU]/mL — ABNORMAL HIGH (ref 0.350–4.500)

## 2016-04-28 MED ORDER — SODIUM CHLORIDE 0.9 % IV SOLN
Freq: Once | INTRAVENOUS | Status: AC
  Administered 2016-04-28: 10:00:00 via INTRAVENOUS
  Filled 2016-04-28: qty 1000

## 2016-04-28 MED ORDER — NIVOLUMAB CHEMO INJECTION 100 MG/10ML
240.0000 mg | Freq: Once | INTRAVENOUS | Status: AC
  Administered 2016-04-28: 240 mg via INTRAVENOUS
  Filled 2016-04-28: qty 10

## 2016-04-28 MED ORDER — TRIAMCINOLONE ACETONIDE 0.5 % EX CREA: TOPICAL_CREAM | Freq: Two times a day (BID) | CUTANEOUS | 1 refills | Status: DC

## 2016-04-28 NOTE — Progress Notes (Signed)
Patient requesting refill for triamcinolone cream.

## 2016-04-28 NOTE — Progress Notes (Signed)
[facility] day:  04/28/16   Chief Complaint: Marc Blake is a 60 y.o. male with metastatic renal cell carcinoma who is seen for assessment prior to cycle #7 nivolumab.  HPI: The patient was last seen in the medical oncology clinic on 04/14/2016.  At that time, he noted a "drab week".  He denied any specific symptoms. He continues to have no edema in his left leg.  He denied any respiratory symptoms.  He denied any nausea vomiting or diarrhea. He received cycle #6 nivolumab.  Chest, abdomen, and pelvic CT scan on 04/26/2016 revealed continued slow growth of widespread pulmonary metastatic disease.  Many nodules were similar in size.  Some nodules grew (2 mm).  There was no other progressive disease identified. The bilateral renal masses appeared grossly unchanged.  There was stable chronic osseous metastases and postsurgical changes.  Symptomatically, he is doing well.  He feels "chipper".  He states he is 60:40 (60% good, 40% not good).  The "not good part" is "too much thinking in my head".  He states that he can now get on a motorcycle (couldn't do this 3 months ago).  He is scheduled for surgery on his left leg at [facility] on 06/16/2016.  He has an anesthesis pre-op appointment on 05/19/2016.  He has been cleared by cardiology.    Past Medical History:  Diagnosis Date  . Acid reflux   . Anxiety   . Arthritis   . CHF (congestive heart failure) ([place])   . Depression   . Depression   . Hyperlipidemia   . Hypertension   . Myocardial infarction   . Renal cancer ([place])   . Renal cell carcinoma ([place])   . Skin cancer     Past Surgical History:  Procedure Laterality Date  . BACK SURGERY    . CARDIAC CATHETERIZATION     with stent  . CYSTOSCOPY WITH BIOPSY  03/06/2016   Procedure: CYSTOSCOPY WITH BIOPSY;  Surgeon: [name], MD;  Location: [facility] ORS;  Service: Urology;;  . [name] WITH URETHRAL DILATATION N/A 03/06/2016   Procedure:  CYSTOSCOPY WITH URETHRAL DILATATION WITH CATHETER PLACEMENT;  Surgeon: [name], MD;  Location: [facility] ORS;  Service: Urology;  Laterality: N/A;  . KNEE SURGERY Left   . LEG SURGERY Left   . TONSILLECTOMY      Family History  Problem Relation Age of Onset  . Hypertension Father   . Heart attack Father   . Stroke Sister   . Hypertension Brother   . Stroke Maternal Uncle     Social History:  reports that he has never smoked. He has never used smokeless tobacco. He reports that he does not drink alcohol or use drugs.  He is originally from [place].  He then lived in [place] for 17 years.  He moved to [place] with his wife.  The patient is alone today.  Allergies: No Known Allergies  Current Medications: Current Outpatient Prescriptions  Medication Sig Dispense Refill  . amLODipine (NORVASC) 2.5 MG tablet TAKE 1 TABLET ONCE A DAY FOR 30 DAYS  2  . atorvastatin (LIPITOR) 20 MG tablet TAKE 1 TABLET ONCE A DAY (AT BEDTIME) FOR 30 DAYS  2  . busPIRone (BUSPAR) 5 MG tablet TAKE 1 TABLET 3 TIMES A DAY FOR 30 DAYS  2  . carvedilol (COREG) 12.5 MG tablet TAKE 1 TABLET BY MOUTH TWICE A DAY FOR 30 DAYS  3  . gabapentin (NEURONTIN) 300 MG capsule  Take 300 mg by mouth 3 (three) times daily.     Marland Kitchen ibuprofen (ADVIL,MOTRIN) 200 MG tablet Take 200 mg by mouth every 8 (eight) hours as needed for moderate pain. 4 every 8 hours    . lisinopril (PRINIVIL,ZESTRIL) 20 MG tablet TAKE 1 TABLET ONCE A DAY FOR 30 DAYS  3  . morphine (MS CONTIN) 15 MG 12 hr tablet Take 15 mg by mouth every 12 (twelve) hours.  0  . Nivolumab (OPDIVO IV) Inject into the vein.    Marland Kitchen oxyCODONE (OXY IR/ROXICODONE) 5 MG immediate release tablet Take 5 mg by mouth every 8 (eight) hours as needed (patient takes 2 tabs in the evening only).     Marland Kitchen senna (SENOKOT) 8.6 MG tablet Take 2 tablets by mouth at bedtime.     . sertraline (ZOLOFT) 100 MG tablet Take 100 mg by mouth at bedtime.  1  . tamsulosin (FLOMAX) 0.4 MG CAPS capsule  Take 0.8 mg by mouth at bedtime.    . traZODone (DESYREL) 50 MG tablet Take 50 mg by mouth at bedtime.   3  . triamcinolone cream (KENALOG) 0.5 % Apply topically.    Alveda Reasons 20 MG TABS tablet Take 20 mg by mouth daily.  1   No current facility-administered medications for this visit.     Review of Systems:  GENERAL:  Feels "chipper".  No fevers or sweats.  Weight up 12 pounds. PERFORMANCE STATUS (ECOG):  1 HEENT:  No visual changes, runny nose, sore throat, mouth sores or tenderness.  Hasn't decided about fixing teeth. Lungs:  No shortness of breath, cough or wheezing.  No hemoptysis. Cardiac:  No chest pain, palpitations, orthopnea, or PND. GI:  Eating well.  No nausea, vomiting, constipation, melena or hematochezia. GU:  Voiding well.  No urgency, frequency, dysuria, or hematuria. Musculoskeletal:  Femur issues (surgery at [facility] on 06[facility]16/2016).  No muscle tenderness. Extremities:  Improvement in chronic left lower extremity swelling. Skin:  Eczema on elbows, worse without topical steroids. Neuro:  No headache, numbness or weakness.  Decreased feeling left leg (chronic). Endocrine:  No diabetes, thyroid issues, hot flashes or night sweats. Psych:  No mood changes, depression or anxiety. Pain:  No focal pain. Review of systems:  All other systems reviewed and found to be negative.  Physical Exam: Blood pressure 103/64, pulse 62, temperature (!) 95.9 F (35.5 C), temperature source Tympanic, resp. rate 18, weight 255 lb 15.3 oz (116.1 kg). GENERAL:  Well developed, well nourished, gentleman sitting comfortably in the exam room in no acute distress.  He has a cane at his side. MENTAL STATUS:  Alert and oriented to person, place and time. HEAD:  Long gray hair with goatee.  Normocephalic, atraumatic, face symmetric, no Cushingoid features. EYES:  Pupils equal round and reactive to light and accomodation.  No conjunctivitis or scleral icterus. ENT:  Oropharynx clear without lesion.  Poor  dentition.  Tongue normal. Mucous membranes moist.  RESPIRATORY:  Clear to auscultation without rales, wheezes or rhonchi. CARDIOVASCULAR:  Regular rate and rhythm without murmur, rub or gallop.  No JVD. ABDOMEN:  Soft, non-tender, with active bowel sounds, and no hepatosplenomegaly.  No masses. SKIN:  Tattoos. Eczema on dorsal surface of forearms.  Patchy eczema right flank (slightly worse).  EXTREMITIES: Minimal left lower extremity edema.  No skin discoloration or tenderness.  No palpable cords. LYMPH NODES: No palpable cervical, supraclavicular, axillary or inguinal adenopathy  NEUROLOGICAL:  Stable.  No change in ambulation. PSYCH:  Appropriate.  Marland Kitchen  Appointment on 04/28/2016  Component Date Value Ref Range Status  . Magnesium 04/28/2016 2.0  1.7 - 2.4 mg/dL Final  . WBC 04/28/2016 6.3  3.8 - 10.6 K/uL Final  . RBC 04/28/2016 4.16* 4.40 - 5.90 MIL/uL Final  . Hemoglobin 04/28/2016 11.1* 13.0 - 18.0 g/dL Final  . HCT 04/28/2016 34.6* 40.0 - 52.0 % Final  . MCV 04/28/2016 83.2  80.0 - 100.0 fL Final  . MCH 04/28/2016 26.8  26.0 - 34.0 pg Final  . MCHC 04/28/2016 32.2  32.0 - 36.0 g/dL Final  . RDW 04/28/2016 16.7* 11.5 - 14.5 % Final  . Platelets 04/28/2016 191  150 - 440 K/uL Final  . Neutrophils Relative % 04/28/2016 67  % Final  . Neutro Abs 04/28/2016 4.3  1.4 - 6.5 K/uL Final  . Lymphocytes Relative 04/28/2016 17  % Final  . Lymphs Abs 04/28/2016 1.0  1.0 - 3.6 K/uL Final  . Monocytes Relative 04/28/2016 12  % Final  . Monocytes Absolute 04/28/2016 0.7  0.2 - 1.0 K/uL Final  . Eosinophils Relative 04/28/2016 3  % Final  . Eosinophils Absolute 04/28/2016 0.2  0 - 0.7 K/uL Final  . Basophils Relative 04/28/2016 1  % Final  . Basophils Absolute 04/28/2016 0.0  0 - 0.1 K/uL Final  . Sodium 04/28/2016 140  135 - 145 mmol/L Final  . Potassium 04/28/2016 3.9  3.5 - 5.1 mmol/L Final  . Chloride 04/28/2016 103  101 - 111 mmol/L Final  . CO2 04/28/2016 30  22 - 32 mmol/L Final  .  Glucose, Bld 04/28/2016 123* 65 - 99 mg/dL Final  . BUN 04/28/2016 17  6 - 20 mg/dL Final  . Creatinine, Ser 04/28/2016 1.14  0.61 - 1.24 mg/dL Final  . Calcium 04/28/2016 9.1  8.9 - 10.3 mg/dL Final  . Total Protein 04/28/2016 7.3  6.5 - 8.1 g/dL Final  . Albumin 04/28/2016 3.9  3.5 - 5.0 g/dL Final  . AST 04/28/2016 16  15 - 41 U/L Final  . ALT 04/28/2016 11* 17 - 63 U/L Final  . Alkaline Phosphatase 04/28/2016 88  38 - 126 U/L Final  . Total Bilirubin 04/28/2016 0.5  0.3 - 1.2 mg/dL Final  . GFR calc non Af Amer 04/28/2016 >60  >60 mL/min Final  . GFR calc Af Amer 04/28/2016 >60  >60 mL/min Final   Comment: (NOTE) The eGFR has been calculated using the CKD EPI equation. This calculation has not been validated in all clinical situations. eGFR's persistently <60 mL/min signify possible Chronic Kidney Disease.   . Anion gap 04/28/2016 7  5 - 15 Final  . LDH 04/28/2016 123  98 - 192 U/L Final    Assessment:  Marc Blake is a 60 y.o. male with metastatic renal cell carcinoma presenting in 12/2013.  He had a large left renal mass, rib and vertebral body involvement, and multiple pulmonary nodules.  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    He was on Votrient (pazopanib) from 02/13/2014 - 01/21/2016.  He began nivolumab on 01/28/2016.  Abdominal and pelvic CT scan on 01/12/2014 revealed an 8.5 x 10.2 x 9.2 cm irregularly enhancing mass of the left kidney with some internal calcifications. There were enlarged left sided renal hilar lymph nodes. There were multiple pulmonary nodules in both lower lobes (largest 1.1 cm). There was a destructive bone lesion with an irregular 8.6 x 5.1 cm soft tissue mass invasive of the  spinal canal narrowing at least 50% and disturbing the pedicle and portions of the left transverse process rib and T8 vertebral body.   Chest CT on 01/15/2014 revealed innumerable pulmonary nodules, a large metastatic lesion  at T8 and left eighth rib with invasion into the spinal canal and moderate canal stenosis. There was a left upper pole right kidney mass.  Head MRI was negative.  He received 3000 cGy to T7 - T9 and associated ribs beginning 05/11/2014.  On 06/15/2014, he received 1 dose of nivolumab.  He presented with progressive paralysis from the waist down on 07/06/2014.  He underwent left posterolateral thoracotomy with left chest wall resection with T8 corpectomy and interbody fusion with donor bone on 07/06/2014.  Pathology revealed metastatic renal cell carcinoma. Post-operatively, he developed sepsis secondary to a submandibular abscess dental abscess.  He underwent tracheostomy, I&D of a right neck abscess, tooth extraction, and PEG tube placement.  Zometa has subsequently been on hold.  He developed a pathologic fracture of the left femur.  He is s/p intramedullary nailing on 01/27/2015.  He received radiation to the left leg.  He was admitted on 04/26/2015 with aspiration pneumonia and opioid overdose. He is currently taking MSContin 15 mg BID and oxycodone 5 mg q day.  Chest, abdomen, and pelvic CT scan on 05/31/2015 revealed slight enlargement of multiple pulmonary nodules.  The left renal mass was similar (12.2 x 9.6 cm).  The right kidney lesion was stable (lower pole lesion 2 x 1.7 cm).  Bone scan on 05/31/2015 revealed slight increased activity in the left costal vertebral junction at T7.  Chest, abdomen, and pelvic CT scan on 12/14/2015 revealed an irregular 10.7 cm heterogeneous renal cortical mass in the upper left kidney and a heterogeneous 2.3 cm renal cortical mass in the lateral lower right kidney.  There was mild bilateral hydroureteronephrosis with bladder distention.  There was mild left para-aortic lymphadenopathy.  There were numerous (greater than 20) pulmonary metastases.  Largest nodule was 2.1 cm in the RLL. There was a partially visualized mildly expansile lytic lesion in the left  proximal femur status post surgical transfixation.  Thre was a small probably loculated pleural effusion at the left eighth rib resection site.  There were no other bone abnormalities.  Bone scan on 12/23/2015 revealed photopenia from prior removal of the left eighth rib. There was increased  uptake in the medial thoracic spine from T7 and T9 is felt to be due to postoperative fusion.  There was increased uptake throughout much of the left femur as well as increased uptake in the soft tissues of much of the left femur which may be due to previous surgery and radiation therapy change.  He developed a left lower extremity DVT (popliteal and soleal vein) on 06/07/2015.  Left lower extremity ultrasound on 02/07/2016 revealed no evidence of DVT.  He is on long term Xarelto.  Patient has a long standing history of an obstructive uropathy and elevated PVR.  He has undergone urethral dilatation on several occasions.  He underwent cystoscopy, balloon urethral dilatation, and bladder biopsy with Foley catheter placement on 03/06/2016.  Bladder biopsy revealed cystitis with reactive changes, negative for atypia and malignancy.  Foley catheter remains in place.  He is s/p 6 cycles of nivolumab (01/28/2016 - 04/14/2016).  He tolerated his infusions well.  TSH was 6.239 (0.35-4.50) with a normal free T4 (0.79) on 02/25/2016.  He was admitted to [facility] from 02/14/2016 - 02/16/2016 with chest pain.  Chest CT angiogram  on 02/15/2016 ruled out pulmonary embolism. Imaging revealed slight progression of mediastinal left hilar adenopathy was pulmonary metastatic disease. Stress test on 02/15/2016 ruled out cardiac ischemia.  He was treated with Augmentin for presumed lower extremity cellulitis.  Chest CT on 03/10/2016 revealed no evidence of pneumonia or pneumonitis.  There was left mild atelectasis small effusion similar to prior.  There were bilateral multiple metastasis (stable).   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  There is possible increase in change in the lytic lesion with some movement at the fracture site.  He has broken the distal locking screw.  He ambulates with a rolling walker.  He has been referred to [place].  He has restrictive lung disease secondary to his weight and prior radiation.  He has been prescribed oxygen (not received).    His psoriasis flared on nivolumab, but has improved with topical steroids.  He is postponing dental assessment until after 06/19/2016 Aaron Edelman on hold).  Symptomatically, feels good.  He is breathing better. He has decreased swelling in the left lower extremity.  Plan: 1.  Labs today:  CBC with diff, CMP, LDH. 2.  Review imaging studies.  Most disease is stable.  Few nodules increased in size by 2 mm.  Discuss plan to continue current therapy with reimaging after 3-4 additional cycles. 3.  Cycle #7 nivolumab. 4.  Coordinate care with [facility] for surgery on 06/16/2016. 5.  Rx: trimcinolone cream 0.5%. 6.  RTC on 05/15/2016 for MD assessment, labs (CBC with diff, CMP, LDH), and cycle #8 nivolumab.   [name], MD  04/28/2016 , 9:00 AM

## 2016-04-29 ENCOUNTER — Encounter: Payer: Self-pay | Admitting: Hematology and Oncology

## 2016-05-01 ENCOUNTER — Telehealth: Payer: Self-pay | Admitting: *Deleted

## 2016-05-01 NOTE — Telephone Encounter (Signed)
Elmyra Ricks from Dr. Loyce Dys office called to let [name] know patient, Marc Blake is scheduled for surgery on 06-16-16.  Wants confirmation that this is okay with his treatment?   Would like call back to clarify.  [phone].

## 2016-05-02 NOTE — Telephone Encounter (Signed)
Called back to [facility] to speak to [phone]. She is in clinic today and I was sent to her voicemail and left her message that pt was rcving opdivo every 2 weeks and he should be fine to have surgery.  No contraindications for surgery and opdivo.  After his surgery if he needs rehab or difficult to come into office we may need to puch put appt 1-2 weeks.  Will f/u after surgery to see how he is doing. If she has  More questions they are welcome to call

## 2016-05-14 ENCOUNTER — Other Ambulatory Visit: Payer: Self-pay | Admitting: Hematology and Oncology

## 2016-05-14 DIAGNOSIS — C642 Malignant neoplasm of left kidney, except renal pelvis: Secondary | ICD-10-CM

## 2016-05-14 NOTE — Progress Notes (Unsigned)
[facility] day:  05/14/16   Chief Complaint: Marc Blake is a 60 y.o. male with metastatic renal cell carcinoma who is seen for assessment prior to cycle #8 nivolumab.  HPI: The patient was last seen in the medical oncology clinic on 04/28/2016.  At that time, he felt good.  He was breathing better. He had decreased swelling in the left lower extremity.  Imaging studies revealed stable disease with some nodules 2 mm larger.  Decision was made to continue therapy with follow-up imaging in 3-4 cycles.  TSH was 5.423 (0.35 -4.5) with a free T4 of 0.71 (0.61 - 1.12).  He received cycle #7 nivolumab.  During the interim,     Past Medical History:  Diagnosis Date  . Acid reflux   . Anxiety   . Arthritis   . CHF (congestive heart failure) ([place])   . Depression   . Depression   . Hyperlipidemia   . Hypertension   . Myocardial infarction   . Renal cancer ([place])   . Renal cell carcinoma ([place])   . Skin cancer     Past Surgical History:  Procedure Laterality Date  . BACK SURGERY    . CARDIAC CATHETERIZATION     with stent  . CYSTOSCOPY WITH BIOPSY  03/06/2016   Procedure: CYSTOSCOPY WITH BIOPSY;  Surgeon: [name], MD;  Location: [facility] ORS;  Service: Urology;;  . [name] WITH URETHRAL DILATATION N/A 03/06/2016   Procedure: CYSTOSCOPY WITH URETHRAL DILATATION WITH CATHETER PLACEMENT;  Surgeon: [name], MD;  Location: [facility] ORS;  Service: Urology;  Laterality: N/A;  . KNEE SURGERY Left   . LEG SURGERY Left   . TONSILLECTOMY      Family History  Problem Relation Age of Onset  . Hypertension Father   . Heart attack Father   . Stroke Sister   . Hypertension Brother   . Stroke Maternal Uncle     Social History:  reports that he has never smoked. He has never used smokeless tobacco. He reports that he does not drink alcohol or use drugs.  He is originally from [place].  He then lived in [place] for 17 years.  He moved  to [place] with his wife.  The patient is alone today.  Allergies: No Known Allergies  Current Medications: Current Outpatient Prescriptions  Medication Sig Dispense Refill  . amLODipine (NORVASC) 2.5 MG tablet TAKE 1 TABLET ONCE A DAY FOR 30 DAYS  2  . atorvastatin (LIPITOR) 20 MG tablet TAKE 1 TABLET ONCE A DAY (AT BEDTIME) FOR 30 DAYS  2  . busPIRone (BUSPAR) 5 MG tablet TAKE 1 TABLET 3 TIMES A DAY FOR 30 DAYS  2  . carvedilol (COREG) 12.5 MG tablet TAKE 1 TABLET BY MOUTH TWICE A DAY FOR 30 DAYS  3  . gabapentin (NEURONTIN) 300 MG capsule Take 300 mg by mouth 3 (three) times daily.     Marland Kitchen ibuprofen (ADVIL,MOTRIN) 200 MG tablet Take 200 mg by mouth every 8 (eight) hours as needed for moderate pain. 4 every 8 hours    . lisinopril (PRINIVIL,ZESTRIL) 20 MG tablet TAKE 1 TABLET ONCE A DAY FOR 30 DAYS  3  . morphine (MS CONTIN) 15 MG 12 hr tablet Take 15 mg by mouth every 12 (twelve) hours.  0  . Nivolumab (OPDIVO IV) Inject into the vein.    Marland Kitchen oxyCODONE (OXY IR/ROXICODONE) 5 MG immediate release tablet Take 5 mg by mouth every 8 (eight) hours  as needed (patient takes 2 tabs in the evening only).     Marland Kitchen senna (SENOKOT) 8.6 MG tablet Take 2 tablets by mouth at bedtime.     . sertraline (ZOLOFT) 100 MG tablet Take 100 mg by mouth at bedtime.  1  . tamsulosin (FLOMAX) 0.4 MG CAPS capsule Take 0.8 mg by mouth at bedtime.    . traZODone (DESYREL) 50 MG tablet Take 50 mg by mouth at bedtime.   3  . triamcinolone cream (KENALOG) 0.5 % Apply topically 2 (two) times daily. 30 g 1  . XARELTO 20 MG TABS tablet Take 20 mg by mouth daily.  1   No current facility-administered medications for this visit.     Review of Systems:  GENERAL:  Feels "chipper".  No fevers or sweats.  Weight up 12 pounds. PERFORMANCE STATUS (ECOG):  1 HEENT:  No visual changes, runny nose, sore throat, mouth sores or tenderness.  Hasn't decided about fixing teeth. Lungs:  No shortness of breath, cough or wheezing.  No  hemoptysis. Cardiac:  No chest pain, palpitations, orthopnea, or PND. GI:  Eating well.  No nausea, vomiting, constipation, melena or hematochezia. GU:  Voiding well.  No urgency, frequency, dysuria, or hematuria. Musculoskeletal:  Femur issues (surgery at [facility] on 06/16/2016).  No muscle tenderness. Extremities:  Improvement in chronic left lower extremity swelling. Skin:  Eczema on elbows, worse without topical steroids. Neuro:  No headache, numbness or weakness.  Decreased feeling left leg (chronic). Endocrine:  No diabetes, thyroid issues, hot flashes or night sweats. Psych:  No mood changes, depression or anxiety. Pain:  No focal pain. Review of systems:  All other systems reviewed and found to be negative.  Physical Exam: There were no vitals taken for this visit. GENERAL:  Well developed, well nourished, gentleman sitting comfortably in the exam room in no acute distress.  He has a cane at his side. MENTAL STATUS:  Alert and oriented to person, place and time. HEAD:  Long gray hair with goatee.  Normocephalic, atraumatic, face symmetric, no Cushingoid features. EYES:  Pupils equal round and reactive to light and accomodation.  No conjunctivitis or scleral icterus. ENT:  Oropharynx clear without lesion.  Poor dentition.  Tongue normal. Mucous membranes moist.  RESPIRATORY:  Clear to auscultation without rales, wheezes or rhonchi. CARDIOVASCULAR:  Regular rate and rhythm without murmur, rub or gallop.  No JVD. ABDOMEN:  Soft, non-tender, with active bowel sounds, and no hepatosplenomegaly.  No masses. SKIN:  Tattoos. Eczema on dorsal surface of forearms.  Patchy eczema right flank (slightly worse).  EXTREMITIES: Minimal left lower extremity edema.  No skin discoloration or tenderness.  No palpable cords. LYMPH NODES: No palpable cervical, supraclavicular, axillary or inguinal adenopathy  NEUROLOGICAL:  Stable.  No change in ambulation. PSYCH:  Appropriate.  Marland Kitchen  No visits with results  within 3 Day(s) from this visit.  Latest known visit with results is:  Appointment on 04/28/2016  Component Date Value Ref Range Status  . Magnesium 04/28/2016 2.0  1.7 - 2.4 mg/dL Final  . WBC 04/28/2016 6.3  3.8 - 10.6 K/uL Final  . RBC 04/28/2016 4.16* 4.40 - 5.90 MIL/uL Final  . Hemoglobin 04/28/2016 11.1* 13.0 - 18.0 g/dL Final  . HCT 04/28/2016 34.6* 40.0 - 52.0 % Final  . MCV 04/28/2016 83.2  80.0 - 100.0 fL Final  . MCH 04/28/2016 26.8  26.0 - 34.0 pg Final  . MCHC 04/28/2016 32.2  32.0 - 36.0 g/dL Final  . RDW  04/28/2016 16.7* 11.5 - 14.5 % Final  . Platelets 04/28/2016 191  150 - 440 K/uL Final  . Neutrophils Relative % 04/28/2016 67  % Final  . Neutro Abs 04/28/2016 4.3  1.4 - 6.5 K/uL Final  . Lymphocytes Relative 04/28/2016 17  % Final  . Lymphs Abs 04/28/2016 1.0  1.0 - 3.6 K/uL Final  . Monocytes Relative 04/28/2016 12  % Final  . Monocytes Absolute 04/28/2016 0.7  0.2 - 1.0 K/uL Final  . Eosinophils Relative 04/28/2016 3  % Final  . Eosinophils Absolute 04/28/2016 0.2  0 - 0.7 K/uL Final  . Basophils Relative 04/28/2016 1  % Final  . Basophils Absolute 04/28/2016 0.0  0 - 0.1 K/uL Final  . Sodium 04/28/2016 140  135 - 145 mmol/L Final  . Potassium 04/28/2016 3.9  3.5 - 5.1 mmol/L Final  . Chloride 04/28/2016 103  101 - 111 mmol/L Final  . CO2 04/28/2016 30  22 - 32 mmol/L Final  . Glucose, Bld 04/28/2016 123* 65 - 99 mg/dL Final  . BUN 04/28/2016 17  6 - 20 mg/dL Final  . Creatinine, Ser 04/28/2016 1.14  0.61 - 1.24 mg/dL Final  . Calcium 04/28/2016 9.1  8.9 - 10.3 mg/dL Final  . Total Protein 04/28/2016 7.3  6.5 - 8.1 g/dL Final  . Albumin 04/28/2016 3.9  3.5 - 5.0 g/dL Final  . AST 04/28/2016 16  15 - 41 U/L Final  . ALT 04/28/2016 11* 17 - 63 U/L Final  . Alkaline Phosphatase 04/28/2016 88  38 - 126 U/L Final  . Total Bilirubin 04/28/2016 0.5  0.3 - 1.2 mg/dL Final  . GFR calc non Af Amer 04/28/2016 >60  >60 mL/min Final  . GFR calc Af Amer 04/28/2016 >60   >60 mL/min Final   Comment: (NOTE) The eGFR has been calculated using the CKD EPI equation. This calculation has not been validated in all clinical situations. eGFR's persistently <60 mL/min signify possible Chronic Kidney Disease.   . Anion gap 04/28/2016 7  5 - 15 Final  . LDH 04/28/2016 123  98 - 192 U/L Final  . TSH 04/28/2016 5.423* 0.350 - 4.500 uIU/mL Final  . Free T4 04/28/2016 0.71  0.61 - 1.12 ng/dL Final   Comment: (NOTE) Biotin ingestion may interfere with free T4 tests. If the results are inconsistent with the TSH level, previous test results, or the clinical presentation, then consider biotin interference. If needed, order repeat testing after stopping biotin.     Assessment:  BRAIDON CHERMAK is a 60 y.o. male with metastatic renal cell carcinoma presenting in 12/2013.  He had a large left renal mass, rib and vertebral body involvement, and multiple pulmonary nodules.  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    He was on Votrient (pazopanib) from 02/13/2014 - 01/21/2016.  He began nivolumab on 01/28/2016.  Abdominal and pelvic CT scan on 01/12/2014 revealed an 8.5 x 10.2 x 9.2 cm irregularly enhancing mass of the left kidney with some internal calcifications. There were enlarged left sided renal hilar lymph nodes. There were multiple pulmonary nodules in both lower lobes (largest 1.1 cm). There was a destructive bone lesion with an irregular 8.6 x 5.1 cm soft tissue mass invasive of the spinal canal narrowing at least 50% and disturbing the pedicle and portions of the left transverse process rib and T8 vertebral body.   Chest CT on 01/15/2014 revealed innumerable pulmonary nodules, a large metastatic  lesion at T8 and left eighth rib with invasion into the spinal canal and moderate canal stenosis. There was a left upper pole right kidney mass.  Head MRI was negative.  He received 3000 cGy to T7 - T9 and associated ribs  beginning 05/11/2014.  On 06/15/2014, he received 1 dose of nivolumab.  He presented with progressive paralysis from the waist down on 07/06/2014.  He underwent left posterolateral thoracotomy with left chest wall resection with T8 corpectomy and interbody fusion with donor bone on 07/06/2014.  Pathology revealed metastatic renal cell carcinoma. Post-operatively, he developed sepsis secondary to a submandibular abscess dental abscess.  He underwent tracheostomy, I&D of a right neck abscess, tooth extraction, and PEG tube placement.  Zometa has subsequently been on hold.  He developed a pathologic fracture of the left femur.  He is s/p intramedullary nailing on 01/27/2015.  He received radiation to the left leg.  He was admitted on 04/26/2015 with aspiration pneumonia and opioid overdose. He is currently taking MSContin 15 mg BID and oxycodone 5 mg q day.  Chest, abdomen, and pelvic CT scan on 05/31/2015 revealed slight enlargement of multiple pulmonary nodules.  The left renal mass was similar (12.2 x 9.6 cm).  The right kidney lesion was stable (lower pole lesion 2 x 1.7 cm).  Bone scan on 05/31/2015 revealed slight increased activity in the left costal vertebral junction at T7.  Chest, abdomen, and pelvic CT scan on 12/14/2015 revealed an irregular 10.7 cm heterogeneous renal cortical mass in the upper left kidney and a heterogeneous 2.3 cm renal cortical mass in the lateral lower right kidney.  There was mild bilateral hydroureteronephrosis with bladder distention.  There was mild left para-aortic lymphadenopathy.  There were numerous (greater than 20) pulmonary metastases.  Largest nodule was 2.1 cm in the RLL. There was a partially visualized mildly expansile lytic lesion in the left proximal femur status post surgical transfixation.  Thre was a small probably loculated pleural effusion at the left eighth rib resection site.  There were no other bone abnormalities.  Bone scan on 12/23/2015 revealed  photopenia from prior removal of the left eighth rib. There was increased  uptake in the medial thoracic spine from T7 and T9 is felt to be due to postoperative fusion.  There was increased uptake throughout much of the left femur as well as increased uptake in the soft tissues of much of the left femur which may be due to previous surgery and radiation therapy change.  He developed a left lower extremity DVT (popliteal and soleal vein) on 06/07/2015.  Left lower extremity ultrasound on 02/07/2016 revealed no evidence of DVT.  He is on long term Xarelto.  Patient has a long standing history of an obstructive uropathy and elevated PVR.  He has undergone urethral dilatation on several occasions.  He underwent cystoscopy, balloon urethral dilatation, and bladder biopsy with Foley catheter placement on 03/06/2016.  Bladder biopsy revealed cystitis with reactive changes, negative for atypia and malignancy.  Foley catheter remains in place.  He is s/p 7 cycles of nivolumab (01/28/2016 - 04/28/2016).  He tolerated his infusions well.  TSH was 6.239 (0.35-4.50) with a normal free T4 (0.79) on 02/25/2016.  He was admitted to [facility] from 02/14/2016 - 02/16/2016 with chest pain.  Chest CT angiogram on 02/15/2016 ruled out pulmonary embolism. Imaging revealed slight progression of mediastinal left hilar adenopathy was pulmonary metastatic disease. Stress test on 02/15/2016 ruled out cardiac ischemia.  He was treated with Augmentin for presumed  lower extremity cellulitis.  Chest CT on 03/10/2016 revealed no evidence of pneumonia or pneumonitis.  There was left mild atelectasis small effusion similar to prior.  There were bilateral multiple metastasis (stable).   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  There is possible increase in change in the lytic lesion with some movement at the fracture site.  He has broken the distal locking screw.  He ambulates with a rolling walker.  He is scheduled for  surgery at [facility] on 06/16/2016.  He has restrictive lung disease secondary to his weight and prior radiation.  He has been prescribed oxygen (not received).    His psoriasis flared on nivolumab, but has improved with topical steroids.  He is postponing dental assessment until after 06/19/2016 Aaron Edelman on hold).  Symptomatically, feels good.  He is breathing better. He has decreased swelling in the left lower extremity.  Plan: 1.  Labs today:  CBC with diff, CMP, LDH, TSH, free T4. 2.  Cycle #8 nivolumab. 3.  Coordinate care with [facility] for surgery on 06/16/2016. 4.  RTC in 2 weeks for MD assessment, labs (CBC with diff, CMP, LDH), and cycle #9 nivolumab.   [name], MD  05/14/2016 , 1:48 PM

## 2016-05-15 ENCOUNTER — Inpatient Hospital Stay: Payer: 59

## 2016-05-15 ENCOUNTER — Inpatient Hospital Stay: Payer: 59 | Admitting: Hematology and Oncology

## 2016-05-19 ENCOUNTER — Encounter: Payer: Self-pay | Admitting: Hematology and Oncology

## 2016-05-19 ENCOUNTER — Inpatient Hospital Stay: Payer: 59

## 2016-05-19 ENCOUNTER — Inpatient Hospital Stay: Payer: 59 | Attending: Hematology and Oncology

## 2016-05-19 ENCOUNTER — Inpatient Hospital Stay (HOSPITAL_BASED_OUTPATIENT_CLINIC_OR_DEPARTMENT_OTHER): Payer: 59 | Admitting: Hematology and Oncology

## 2016-05-19 ENCOUNTER — Other Ambulatory Visit: Payer: Self-pay | Admitting: Hematology and Oncology

## 2016-05-19 VITALS — BP 112/69 | HR 66 | Temp 96.2°F | Resp 18 | Wt 241.6 lb

## 2016-05-19 DIAGNOSIS — Z87891 Personal history of nicotine dependence: Secondary | ICD-10-CM | POA: Insufficient documentation

## 2016-05-19 DIAGNOSIS — R634 Abnormal weight loss: Secondary | ICD-10-CM

## 2016-05-19 DIAGNOSIS — E785 Hyperlipidemia, unspecified: Secondary | ICD-10-CM | POA: Diagnosis not present

## 2016-05-19 DIAGNOSIS — Z5111 Encounter for antineoplastic chemotherapy: Secondary | ICD-10-CM | POA: Insufficient documentation

## 2016-05-19 DIAGNOSIS — I959 Hypotension, unspecified: Secondary | ICD-10-CM | POA: Insufficient documentation

## 2016-05-19 DIAGNOSIS — Z5112 Encounter for antineoplastic immunotherapy: Secondary | ICD-10-CM | POA: Insufficient documentation

## 2016-05-19 DIAGNOSIS — C642 Malignant neoplasm of left kidney, except renal pelvis: Secondary | ICD-10-CM

## 2016-05-19 DIAGNOSIS — K219 Gastro-esophageal reflux disease without esophagitis: Secondary | ICD-10-CM | POA: Diagnosis not present

## 2016-05-19 DIAGNOSIS — Z923 Personal history of irradiation: Secondary | ICD-10-CM | POA: Insufficient documentation

## 2016-05-19 DIAGNOSIS — I10 Essential (primary) hypertension: Secondary | ICD-10-CM | POA: Insufficient documentation

## 2016-05-19 DIAGNOSIS — C7800 Secondary malignant neoplasm of unspecified lung: Secondary | ICD-10-CM | POA: Insufficient documentation

## 2016-05-19 DIAGNOSIS — L409 Psoriasis, unspecified: Secondary | ICD-10-CM | POA: Insufficient documentation

## 2016-05-19 DIAGNOSIS — R7989 Other specified abnormal findings of blood chemistry: Secondary | ICD-10-CM

## 2016-05-19 DIAGNOSIS — J690 Pneumonitis due to inhalation of food and vomit: Secondary | ICD-10-CM | POA: Diagnosis not present

## 2016-05-19 DIAGNOSIS — Z8701 Personal history of pneumonia (recurrent): Secondary | ICD-10-CM | POA: Insufficient documentation

## 2016-05-19 DIAGNOSIS — N1330 Unspecified hydronephrosis: Secondary | ICD-10-CM | POA: Diagnosis not present

## 2016-05-19 DIAGNOSIS — R197 Diarrhea, unspecified: Secondary | ICD-10-CM

## 2016-05-19 DIAGNOSIS — Z86718 Personal history of other venous thrombosis and embolism: Secondary | ICD-10-CM | POA: Insufficient documentation

## 2016-05-19 DIAGNOSIS — M7989 Other specified soft tissue disorders: Secondary | ICD-10-CM | POA: Insufficient documentation

## 2016-05-19 DIAGNOSIS — N3289 Other specified disorders of bladder: Secondary | ICD-10-CM

## 2016-05-19 DIAGNOSIS — F329 Major depressive disorder, single episode, unspecified: Secondary | ICD-10-CM

## 2016-05-19 DIAGNOSIS — I509 Heart failure, unspecified: Secondary | ICD-10-CM | POA: Diagnosis not present

## 2016-05-19 DIAGNOSIS — Z79899 Other long term (current) drug therapy: Secondary | ICD-10-CM | POA: Insufficient documentation

## 2016-05-19 DIAGNOSIS — C7951 Secondary malignant neoplasm of bone: Secondary | ICD-10-CM | POA: Insufficient documentation

## 2016-05-19 DIAGNOSIS — R42 Dizziness and giddiness: Secondary | ICD-10-CM | POA: Diagnosis not present

## 2016-05-19 DIAGNOSIS — I252 Old myocardial infarction: Secondary | ICD-10-CM

## 2016-05-19 DIAGNOSIS — F419 Anxiety disorder, unspecified: Secondary | ICD-10-CM

## 2016-05-19 DIAGNOSIS — R918 Other nonspecific abnormal finding of lung field: Secondary | ICD-10-CM | POA: Insufficient documentation

## 2016-05-19 DIAGNOSIS — Z85828 Personal history of other malignant neoplasm of skin: Secondary | ICD-10-CM

## 2016-05-19 DIAGNOSIS — M129 Arthropathy, unspecified: Secondary | ICD-10-CM | POA: Insufficient documentation

## 2016-05-19 DIAGNOSIS — T402X1A Poisoning by other opioids, accidental (unintentional), initial encounter: Secondary | ICD-10-CM | POA: Insufficient documentation

## 2016-05-19 DIAGNOSIS — Z8781 Personal history of (healed) traumatic fracture: Secondary | ICD-10-CM | POA: Insufficient documentation

## 2016-05-19 LAB — CBC WITH DIFFERENTIAL/PLATELET
Basophils Absolute: 0.1 10*3/uL (ref 0–0.1)
Basophils Relative: 1 %
Eosinophils Absolute: 0.3 10*3/uL (ref 0–0.7)
Eosinophils Relative: 4 %
HCT: 33.1 % — ABNORMAL LOW (ref 40.0–52.0)
Hemoglobin: 10.6 g/dL — ABNORMAL LOW (ref 13.0–18.0)
Lymphocytes Relative: 12 %
Lymphs Abs: 1 10*3/uL (ref 1.0–3.6)
MCH: 25.7 pg — ABNORMAL LOW (ref 26.0–34.0)
MCHC: 32 g/dL (ref 32.0–36.0)
MCV: 80.5 fL (ref 80.0–100.0)
Monocytes Absolute: 0.8 10*3/uL (ref 0.2–1.0)
Monocytes Relative: 10 %
Neutro Abs: 6 10*3/uL (ref 1.4–6.5)
Neutrophils Relative %: 73 %
Platelets: 292 10*3/uL (ref 150–440)
RBC: 4.12 MIL/uL — ABNORMAL LOW (ref 4.40–5.90)
RDW: 17.1 % — ABNORMAL HIGH (ref 11.5–14.5)
WBC: 8.2 10*3/uL (ref 3.8–10.6)

## 2016-05-19 LAB — COMPREHENSIVE METABOLIC PANEL
ALT: 24 U/L (ref 17–63)
AST: 25 U/L (ref 15–41)
Albumin: 3.8 g/dL (ref 3.5–5.0)
Alkaline Phosphatase: 75 U/L (ref 38–126)
Anion gap: 9 (ref 5–15)
BUN: 24 mg/dL — ABNORMAL HIGH (ref 6–20)
CO2: 26 mmol/L (ref 22–32)
Calcium: 9.2 mg/dL (ref 8.9–10.3)
Chloride: 103 mmol/L (ref 101–111)
Creatinine, Ser: 1.16 mg/dL (ref 0.61–1.24)
GFR calc Af Amer: >60 mL/min (ref >60)
GFR calc non Af Amer: >60 mL/min (ref >60)
Glucose, Bld: 109 mg/dL — ABNORMAL HIGH (ref 65–99)
Potassium: 4 mmol/L (ref 3.5–5.1)
Sodium: 138 mmol/L (ref 135–145)
Total Bilirubin: 0.5 mg/dL (ref 0.3–1.2)
Total Protein: 7.8 g/dL (ref 6.5–8.1)

## 2016-05-19 LAB — TSH: TSH: 2.713 u[IU]/mL (ref 0.350–4.500)

## 2016-05-19 LAB — LACTATE DEHYDROGENASE: LDH: 151 U/L (ref 98–192)

## 2016-05-19 LAB — T4, FREE: Free T4: 0.79 ng/dL (ref 0.61–1.12)

## 2016-05-19 LAB — MAGNESIUM: Magnesium: 2.1 mg/dL (ref 1.7–2.4)

## 2016-05-19 NOTE — Progress Notes (Signed)
**Note Marc-Identified via Obfuscation** [facility] day:  05/19/16   Chief Complaint: Marc Blake is a 60 y.o. male with metastatic renal cell carcinoma who is seen for assessment prior to cycle #8 nivolumab.  HPI: The patient was last seen in the medical oncology clinic on 04/28/2016.  At that time, he felt good.  He was breathing better. He had decreased swelling in the left lower extremity.  Imaging studies revealed stable disease with some nodules 2 mm larger.  Decision was made to continue therapy with follow-up imaging in 3-4 cycles.  TSH was 5.423 (0.35 - 4.5) with a free T4 of 0.71 (0.61 - 1.12).  He received cycle #7 nivolumab.  During the interim, his pain medications got "messed up".  He notes diarrhea beginning on Friday, 05/12/2016 (day after Thanksgiving).  He had 5-6 bowel movements on Saturday, Sunday, and Monday.  Yesterday he had 2-3 bowel movements.  His oral intake has been poor.  He is resting much of the day.  Stool is "soft'.  He has had no diarrhea today.  He has lost weight.  He denies any fever.  He initially didn't feel good.  He has felt fine yesterday and today.   Past Medical History:  Diagnosis Date  . Acid reflux   . Anxiety   . Arthritis   . CHF (congestive heart failure) ([place])   . Depression   . Depression   . Hyperlipidemia   . Hypertension   . Myocardial infarction   . Renal cancer ([place])   . Renal cell carcinoma ([place])   . Skin cancer     Past Surgical History:  Procedure Laterality Date  . BACK SURGERY    . CARDIAC CATHETERIZATION     with stent  . CYSTOSCOPY WITH BIOPSY  03/06/2016   Procedure: CYSTOSCOPY WITH BIOPSY;  Surgeon: [name], MD;  Location: [facility] ORS;  Service: Urology;;  . [name] WITH URETHRAL DILATATION N/A 03/06/2016   Procedure: CYSTOSCOPY WITH URETHRAL DILATATION WITH CATHETER PLACEMENT;  Surgeon: [name], MD;  Location: [facility] ORS;  Service: Urology;  Laterality: N/A;  . KNEE SURGERY Left   . LEG  SURGERY Left   . TONSILLECTOMY      Family History  Problem Relation Age of Onset  . Hypertension Father   . Heart attack Father   . Stroke Sister   . Hypertension Brother   . Stroke Maternal Uncle     Social History:  reports that he has never smoked. He has never used smokeless tobacco. He reports that he does not drink alcohol or use drugs.  He is originally from [place].  He then lived in [place] for 17 years.  He moved to [place] with his wife.  The patient is alone today.  Allergies: No Known Allergies  Current Medications: Current Outpatient Prescriptions  Medication Sig Dispense Refill  . amLODipine (NORVASC) 2.5 MG tablet TAKE 1 TABLET ONCE A DAY FOR 30 DAYS  2  . atorvastatin (LIPITOR) 20 MG tablet TAKE 1 TABLET ONCE A DAY (AT BEDTIME) FOR 30 DAYS  2  . busPIRone (BUSPAR) 5 MG tablet TAKE 1 TABLET 3 TIMES A DAY FOR 30 DAYS  2  . carvedilol (COREG) 12.5 MG tablet TAKE 1 TABLET BY MOUTH TWICE A DAY FOR 30 DAYS  3  . gabapentin (NEURONTIN) 300 MG capsule Take 300 mg by mouth 3 (three) times daily.     Marland Kitchen ibuprofen (ADVIL,MOTRIN) 200 MG tablet Take 200 mg by mouth  every 8 (eight) hours as needed for moderate pain. 4 every 8 hours    . lisinopril (PRINIVIL,ZESTRIL) 20 MG tablet TAKE 1 TABLET ONCE A DAY FOR 30 DAYS  3  . Nivolumab (OPDIVO IV) Inject into the vein.    Marland Kitchen oxyCODONE (OXY IR/ROXICODONE) 5 MG immediate release tablet Take 5 mg by mouth every 8 (eight) hours as needed (patient takes 2 tabs in the evening only).     Marland Kitchen senna (SENOKOT) 8.6 MG tablet Take 2 tablets by mouth at bedtime.     . sertraline (ZOLOFT) 100 MG tablet Take 100 mg by mouth at bedtime.  1  . tamsulosin (FLOMAX) 0.4 MG CAPS capsule Take 0.8 mg by mouth at bedtime.    . traZODone (DESYREL) 50 MG tablet Take 50 mg by mouth at bedtime.   3  . triamcinolone cream (KENALOG) 0.5 % Apply topically 2 (two) times daily. 30 g 1  . XARELTO 20 MG TABS tablet Take 20 mg by mouth daily.  1  . morphine (MS  CONTIN) 15 MG 12 hr tablet Take 15 mg by mouth every 12 (twelve) hours.  0   No current facility-administered medications for this visit.     Review of Systems:  GENERAL:  Feels fine today.  No fevers or sweats.  Weight down 14 pounds. PERFORMANCE STATUS (ECOG):  1 HEENT:  No visual changes, runny nose, sore throat, mouth sores or tenderness.  Hasn't decided about fixing teeth. Lungs:  No shortness of breath, cough or wheezing.  No hemoptysis. Cardiac:  No chest pain, palpitations, orthopnea, or PND. GI:  Decreased oral intake x 1 week.  Diarrhea, resolving.  No nausea, vomiting, constipation, melena or hematochezia. GU:  Voiding well.  No urgency, frequency, dysuria, or hematuria. Musculoskeletal:  Femur issues (surgery at [facility] on 06/16/2016).  No muscle tenderness. Extremities:  Chronic left lower extremity swelling. Skin:  Eczema on elbows, worse without topical steroids. Neuro:  "Memory sucks".  No headache, numbness or weakness.  Decreased feeling left leg (chronic). Endocrine:  No diabetes, thyroid issues, hot flashes or night sweats. Psych:  No mood changes, depression or anxiety. Pain:  No focal pain. Review of systems:  All other systems reviewed and found to be negative.  Physical Exam: Blood pressure 112/69, pulse 66, temperature (!) 96.2 F (35.7 C), temperature source Tympanic, resp. rate 18, weight 241 lb 10 oz (109.6 kg). GENERAL:  Well developed, well nourished, slightly fatigued appearing gentleman sitting comfortably in the exam room in no acute distress.  He has a cane at his side. MENTAL STATUS:  Alert and oriented to person, place and time. HEAD:  Long gray hair with goatee.  Normocephalic, atraumatic, face symmetric, no Cushingoid features. EYES:  Pupils equal round and reactive to light and accomodation.  No conjunctivitis or scleral icterus. ENT:  Oropharynx clear without lesion.  Poor dentition.  Tongue normal. Mucous membranes moist.  RESPIRATORY:  Clear to  auscultation without rales, wheezes or rhonchi. CARDIOVASCULAR:  Regular rate and rhythm without murmur, rub or gallop.  No JVD. ABDOMEN:  Soft, non-tender, with active bowel sounds, and no hepatosplenomegaly.  No masses. SKIN:  Tattoos. Eczema on dorsal surface of forearms.  Patchy eczema right flank (stable).  EXTREMITIES: Minimal left lower extremity edema.  No skin discoloration or tenderness.  No palpable cords. LYMPH NODES: No palpable cervical, supraclavicular, axillary or inguinal adenopathy  NEUROLOGICAL:  Stable.  No change in ambulation. PSYCH:  Appropriate.  Marland Kitchen  Appointment on 05/19/2016  Component  Date Value Ref Range Status  . Magnesium 05/19/2016 2.1  1.7 - 2.4 mg/dL Final  . WBC 05/19/2016 8.2  3.8 - 10.6 K/uL Final  . RBC 05/19/2016 4.12* 4.40 - 5.90 MIL/uL Final  . Hemoglobin 05/19/2016 10.6* 13.0 - 18.0 g/dL Final  . HCT 05/19/2016 33.1* 40.0 - 52.0 % Final  . MCV 05/19/2016 80.5  80.0 - 100.0 fL Final  . MCH 05/19/2016 25.7* 26.0 - 34.0 pg Final  . MCHC 05/19/2016 32.0  32.0 - 36.0 g/dL Final  . RDW 05/19/2016 17.1* 11.5 - 14.5 % Final  . Platelets 05/19/2016 292  150 - 440 K/uL Final  . Neutrophils Relative % 05/19/2016 73  % Final  . Neutro Abs 05/19/2016 6.0  1.4 - 6.5 K/uL Final  . Lymphocytes Relative 05/19/2016 12  % Final  . Lymphs Abs 05/19/2016 1.0  1.0 - 3.6 K/uL Final  . Monocytes Relative 05/19/2016 10  % Final  . Monocytes Absolute 05/19/2016 0.8  0.2 - 1.0 K/uL Final  . Eosinophils Relative 05/19/2016 4  % Final  . Eosinophils Absolute 05/19/2016 0.3  0 - 0.7 K/uL Final  . Basophils Relative 05/19/2016 1  % Final  . Basophils Absolute 05/19/2016 0.1  0 - 0.1 K/uL Final  . Sodium 05/19/2016 138  135 - 145 mmol/L Final  . Potassium 05/19/2016 4.0  3.5 - 5.1 mmol/L Final  . Chloride 05/19/2016 103  101 - 111 mmol/L Final  . CO2 05/19/2016 26  22 - 32 mmol/L Final  . Glucose, Bld 05/19/2016 109* 65 - 99 mg/dL Final  . BUN 05/19/2016 24* 6 - 20 mg/dL  Final  . Creatinine, Ser 05/19/2016 1.16  0.61 - 1.24 mg/dL Final  . Calcium 05/19/2016 9.2  8.9 - 10.3 mg/dL Final  . Total Protein 05/19/2016 7.8  6.5 - 8.1 g/dL Final  . Albumin 05/19/2016 3.8  3.5 - 5.0 g/dL Final  . AST 05/19/2016 25  15 - 41 U/L Final  . ALT 05/19/2016 24  17 - 63 U/L Final  . Alkaline Phosphatase 05/19/2016 75  38 - 126 U/L Final  . Total Bilirubin 05/19/2016 0.5  0.3 - 1.2 mg/dL Final  . GFR calc non Af Amer 05/19/2016 >60  >60 mL/min Final  . GFR calc Af Amer 05/19/2016 >60  >60 mL/min Final   Comment: (NOTE) The eGFR has been calculated using the CKD EPI equation. This calculation has not been validated in all clinical situations. eGFR's persistently <60 mL/min signify possible Chronic Kidney Disease.   . Anion gap 05/19/2016 9  5 - 15 Final  . LDH 05/19/2016 151  98 - 192 U/L Final    Assessment:  UNKNOWN SCHLEYER is a 60 y.o. male with metastatic renal cell carcinoma presenting in 12/2013.  He had a large left renal mass, rib and vertebral body involvement, and multiple pulmonary nodules.  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    He was on Votrient (pazopanib) from 02/13/2014 - 01/21/2016.  He began nivolumab on 01/28/2016.  Abdominal and pelvic CT scan on 01/12/2014 revealed an 8.5 x 10.2 x 9.2 cm irregularly enhancing mass of the left kidney with some internal calcifications. There were enlarged left sided renal hilar lymph nodes. There were multiple pulmonary nodules in both lower lobes (largest 1.1 cm). There was a destructive bone lesion with an irregular 8.6 x 5.1 cm soft tissue mass invasive of the spinal canal narrowing at least 50%  and disturbing the pedicle and portions of the left transverse process rib and T8 vertebral body.   Chest CT on 01/15/2014 revealed innumerable pulmonary nodules, a large metastatic lesion at T8 and left eighth rib with invasion into the spinal canal and moderate canal  stenosis. There was a left upper pole right kidney mass.  Head MRI was negative.  He received 3000 cGy to T7 - T9 and associated ribs beginning 05/11/2014.  On 06/15/2014, he received 1 dose of nivolumab.  He presented with progressive paralysis from the waist down on 07/06/2014.  He underwent left posterolateral thoracotomy with left chest wall resection with T8 corpectomy and interbody fusion with donor bone on 07/06/2014.  Pathology revealed metastatic renal cell carcinoma. Post-operatively, he developed sepsis secondary to a submandibular abscess dental abscess.  He underwent tracheostomy, I&D of a right neck abscess, tooth extraction, and PEG tube placement.  Zometa has subsequently been on hold.  He developed a pathologic fracture of the left femur.  He is s/p intramedullary nailing on 01/27/2015.  He received radiation to the left leg.  He was admitted on 04/26/2015 with aspiration pneumonia and opioid overdose. He is currently taking MSContin 15 mg BID and oxycodone 5 mg q day.  Chest, abdomen, and pelvic CT scan on 05/31/2015 revealed slight enlargement of multiple pulmonary nodules.  The left renal mass was similar (12.2 x 9.6 cm).  The right kidney lesion was stable (lower pole lesion 2 x 1.7 cm).  Bone scan on 05/31/2015 revealed slight increased activity in the left costal vertebral junction at T7.  Chest, abdomen, and pelvic CT scan on 12/14/2015 revealed an irregular 10.7 cm heterogeneous renal cortical mass in the upper left kidney and a heterogeneous 2.3 cm renal cortical mass in the lateral lower right kidney.  There was mild bilateral hydroureteronephrosis with bladder distention.  There was mild left para-aortic lymphadenopathy.  There were numerous (greater than 20) pulmonary metastases.  Largest nodule was 2.1 cm in the RLL. There was a partially visualized mildly expansile lytic lesion in the left proximal femur status post surgical transfixation.  Thre was a small probably  loculated pleural effusion at the left eighth rib resection site.  There were no other bone abnormalities.  Bone scan on 12/23/2015 revealed photopenia from prior removal of the left eighth rib. There was increased  uptake in the medial thoracic spine from T7 and T9 is felt to be due to postoperative fusion.  There was increased uptake throughout much of the left femur as well as increased uptake in the soft tissues of much of the left femur which may be due to previous surgery and radiation therapy change.  He developed a left lower extremity DVT (popliteal and soleal vein) on 06/07/2015.  Left lower extremity ultrasound on 02/07/2016 revealed no evidence of DVT.  He is on long term Xarelto.  Patient has a long standing history of an obstructive uropathy and elevated PVR.  He has undergone urethral dilatation on several occasions.  He underwent cystoscopy, balloon urethral dilatation, and bladder biopsy with Foley catheter placement on 03/06/2016.  Bladder biopsy revealed cystitis with reactive changes, negative for atypia and malignancy.  He is s/p 7 cycles of nivolumab (01/28/2016 - 04/28/2016).  He tolerated his infusions well.  TSH was 6.239 (0.35-4.50) with a normal free T4 (0.79) on 02/25/2016.  He was admitted to [facility] from 02/14/2016 - 02/16/2016 with chest pain.  Chest CT angiogram on 02/15/2016 ruled out pulmonary embolism. Imaging revealed slight progression of mediastinal  left hilar adenopathy was pulmonary metastatic disease. Stress test on 02/15/2016 ruled out cardiac ischemia.  He was treated with Augmentin for presumed lower extremity cellulitis.  Chest CT on 03/10/2016 revealed no evidence of pneumonia or pneumonitis.  There was left mild atelectasis small effusion similar to prior.  There were bilateral multiple metastasis (stable).   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  There is possible increase in change in the lytic lesion with some movement at the fracture  site.  He has broken the distal locking screw.  He ambulates with a rolling walker.  He is scheduled for surgery at [facility] on 06/16/2016.  He has restrictive lung disease secondary to his weight and prior radiation.  He has been prescribed oxygen (not received).    His psoriasis flared on nivolumab, but has improved with topical steroids.  He is postponing dental assessment until after 06/19/2016 Elie Confer on hold).  Symptomatically, has had diarrhea for several days after Thanksgiving and after running out of his pain medications.  He has lost 14 pounds.    Plan: 1.  Labs today:  CBC with diff, CMP, LDH, TSH, free T4. 2.  No treatment today. 3.  Stool for C diff. 4.  Discuss fluid and caloric intake.  Discuss pain medication issues and potential withdrawal.  Discuss Opdivo induced colitis (doubt) and treatment with steroids if diarrhea is culture negative and persists. 5.  RTC in 1 week for MD assessment, labs (CBC with diff, CMP, Mg) and cycle #8 Opdivo.   [name], MD  05/19/2016 , 10:58 AM

## 2016-05-19 NOTE — Progress Notes (Signed)
Patient reports have more frequent loose stools for the past week.  Also loss of appetite with a weight loss of 14 lbs since last office visit on 04/28/16.  He missed an appt with the pain clinic so he has been out of his Morphine since 04/28/16 and has enough Oxycodone for the rest of the week.  Has been trying to contact the pain clinic office with no response.

## 2016-05-22 ENCOUNTER — Telehealth: Payer: Self-pay | Admitting: *Deleted

## 2016-05-22 NOTE — Telephone Encounter (Signed)
Called pt on Friday and left him a message stating that I spoke to staff at dr. Primus Bravo office and they were sorry to hear that he did not get rtn cal from messages he left and they would be happy to see him either mon or tues  12/4 or 12/5 at 8:45 and I can call them back and leave message on voicemail that he can come one of those days.  I got a call back from pt this afternoon that he wants to go tom. 12/5.  So I did call [facility] [phone]. And left message that pt chooses 12/5 at 8:45.  Fax # to their offic eif needed [phone]

## 2016-05-26 ENCOUNTER — Inpatient Hospital Stay: Payer: 59

## 2016-05-26 ENCOUNTER — Encounter: Payer: Self-pay | Admitting: Hematology and Oncology

## 2016-05-26 ENCOUNTER — Other Ambulatory Visit: Payer: Self-pay | Admitting: *Deleted

## 2016-05-26 ENCOUNTER — Inpatient Hospital Stay (HOSPITAL_BASED_OUTPATIENT_CLINIC_OR_DEPARTMENT_OTHER): Payer: 59 | Admitting: Hematology and Oncology

## 2016-05-26 ENCOUNTER — Other Ambulatory Visit: Payer: Self-pay | Admitting: Hematology and Oncology

## 2016-05-26 ENCOUNTER — Telehealth: Payer: Self-pay | Admitting: *Deleted

## 2016-05-26 VITALS — BP 84/53 | HR 61 | Temp 96.9°F | Resp 18 | Wt 250.0 lb

## 2016-05-26 DIAGNOSIS — J690 Pneumonitis due to inhalation of food and vomit: Secondary | ICD-10-CM

## 2016-05-26 DIAGNOSIS — R634 Abnormal weight loss: Secondary | ICD-10-CM

## 2016-05-26 DIAGNOSIS — I959 Hypotension, unspecified: Secondary | ICD-10-CM

## 2016-05-26 DIAGNOSIS — Z85828 Personal history of other malignant neoplasm of skin: Secondary | ICD-10-CM

## 2016-05-26 DIAGNOSIS — C642 Malignant neoplasm of left kidney, except renal pelvis: Secondary | ICD-10-CM | POA: Diagnosis not present

## 2016-05-26 DIAGNOSIS — C7800 Secondary malignant neoplasm of unspecified lung: Secondary | ICD-10-CM

## 2016-05-26 DIAGNOSIS — R42 Dizziness and giddiness: Secondary | ICD-10-CM | POA: Diagnosis not present

## 2016-05-26 DIAGNOSIS — Z86718 Personal history of other venous thrombosis and embolism: Secondary | ICD-10-CM

## 2016-05-26 DIAGNOSIS — C7951 Secondary malignant neoplasm of bone: Secondary | ICD-10-CM | POA: Diagnosis not present

## 2016-05-26 DIAGNOSIS — E785 Hyperlipidemia, unspecified: Secondary | ICD-10-CM

## 2016-05-26 DIAGNOSIS — I252 Old myocardial infarction: Secondary | ICD-10-CM

## 2016-05-26 DIAGNOSIS — R197 Diarrhea, unspecified: Secondary | ICD-10-CM

## 2016-05-26 DIAGNOSIS — N3289 Other specified disorders of bladder: Secondary | ICD-10-CM

## 2016-05-26 DIAGNOSIS — I509 Heart failure, unspecified: Secondary | ICD-10-CM

## 2016-05-26 DIAGNOSIS — F329 Major depressive disorder, single episode, unspecified: Secondary | ICD-10-CM

## 2016-05-26 DIAGNOSIS — M7989 Other specified soft tissue disorders: Secondary | ICD-10-CM

## 2016-05-26 DIAGNOSIS — R918 Other nonspecific abnormal finding of lung field: Secondary | ICD-10-CM

## 2016-05-26 DIAGNOSIS — M129 Arthropathy, unspecified: Secondary | ICD-10-CM

## 2016-05-26 DIAGNOSIS — I10 Essential (primary) hypertension: Secondary | ICD-10-CM

## 2016-05-26 DIAGNOSIS — F419 Anxiety disorder, unspecified: Secondary | ICD-10-CM

## 2016-05-26 DIAGNOSIS — N1330 Unspecified hydronephrosis: Secondary | ICD-10-CM

## 2016-05-26 DIAGNOSIS — K219 Gastro-esophageal reflux disease without esophagitis: Secondary | ICD-10-CM

## 2016-05-26 DIAGNOSIS — Z923 Personal history of irradiation: Secondary | ICD-10-CM

## 2016-05-26 DIAGNOSIS — Z8781 Personal history of (healed) traumatic fracture: Secondary | ICD-10-CM

## 2016-05-26 DIAGNOSIS — L409 Psoriasis, unspecified: Secondary | ICD-10-CM

## 2016-05-26 DIAGNOSIS — Z8701 Personal history of pneumonia (recurrent): Secondary | ICD-10-CM

## 2016-05-26 DIAGNOSIS — Z87891 Personal history of nicotine dependence: Secondary | ICD-10-CM

## 2016-05-26 DIAGNOSIS — Z79899 Other long term (current) drug therapy: Secondary | ICD-10-CM

## 2016-05-26 LAB — COMPREHENSIVE METABOLIC PANEL
ALT: 16 U/L — ABNORMAL LOW (ref 17–63)
AST: 15 U/L (ref 15–41)
Albumin: 3.6 g/dL (ref 3.5–5.0)
Alkaline Phosphatase: 66 U/L (ref 38–126)
Anion gap: 8 (ref 5–15)
BUN: 32 mg/dL — ABNORMAL HIGH (ref 6–20)
CO2: 26 mmol/L (ref 22–32)
Calcium: 8.8 mg/dL — ABNORMAL LOW (ref 8.9–10.3)
Chloride: 103 mmol/L (ref 101–111)
Creatinine, Ser: 1.5 mg/dL — ABNORMAL HIGH (ref 0.61–1.24)
GFR calc Af Amer: 57 mL/min — ABNORMAL LOW (ref >60)
GFR calc non Af Amer: 49 mL/min — ABNORMAL LOW (ref >60)
Glucose, Bld: 141 mg/dL — ABNORMAL HIGH (ref 65–99)
Potassium: 4.3 mmol/L (ref 3.5–5.1)
Sodium: 137 mmol/L (ref 135–145)
Total Bilirubin: 0.4 mg/dL (ref 0.3–1.2)
Total Protein: 7.6 g/dL (ref 6.5–8.1)

## 2016-05-26 LAB — CBC WITH DIFFERENTIAL/PLATELET
Basophils Absolute: 0 10*3/uL (ref 0–0.1)
Basophils Relative: 1 %
Eosinophils Absolute: 0.2 10*3/uL (ref 0–0.7)
Eosinophils Relative: 3 %
HCT: 28.6 % — ABNORMAL LOW (ref 40.0–52.0)
Hemoglobin: 9.1 g/dL — ABNORMAL LOW (ref 13.0–18.0)
Lymphocytes Relative: 12 %
Lymphs Abs: 0.9 10*3/uL — ABNORMAL LOW (ref 1.0–3.6)
MCH: 25.7 pg — ABNORMAL LOW (ref 26.0–34.0)
MCHC: 31.9 g/dL — ABNORMAL LOW (ref 32.0–36.0)
MCV: 80.4 fL (ref 80.0–100.0)
Monocytes Absolute: 0.9 10*3/uL (ref 0.2–1.0)
Monocytes Relative: 12 %
Neutro Abs: 5.1 10*3/uL (ref 1.4–6.5)
Neutrophils Relative %: 72 %
Platelets: 283 10*3/uL (ref 150–440)
RBC: 3.55 MIL/uL — ABNORMAL LOW (ref 4.40–5.90)
RDW: 16.8 % — ABNORMAL HIGH (ref 11.5–14.5)
WBC: 7.2 10*3/uL (ref 3.8–10.6)

## 2016-05-26 LAB — CORTISOL: Cortisol, Plasma: 7.9 ug/dL

## 2016-05-26 LAB — MAGNESIUM: Magnesium: 2.2 mg/dL (ref 1.7–2.4)

## 2016-05-26 NOTE — Progress Notes (Signed)
No new diagnosis since last visit. No voiced concerns at this time. Orthostatic bp taken  Sitting 87/47, hr 67  Standing 84/57 hr 69

## 2016-05-26 NOTE — Progress Notes (Signed)
[facility] day:  05/26/16   Chief Complaint: Marc Blake is a 60 y.o. male with metastatic renal cell carcinoma who is seen for assessment prior to cycle #8 nivolumab.  HPI: The patient was last seen in the medical oncology clinic on 05/19/2016.  At that time, he noted diarrhea since Thanksgiving and running out of his pain medications.  TSH was 2.713 (normal) and free T4 0.79 (normal).  Stool culture and C diff was requested (not performed).  He was felt to possibly have narcotic withdrawal or dietary indiscretion causing his diarrhea.  As nivolumab induced colitis was a possibility, decision was made to hold treatment.  No steroids were instituted.  He notes that his diarrhea has resolved. Overall he feels fine and in general better. His pain medications were refilled. Today, he notes lightheadedness when bending over. Psoriasis is worse. Prior to instituting his pain medications, he had joint pain in her shoulders, elbows, and wrist. Since back on his pain medications for the past 3 days, denies any joint pain.   Past Medical History:  Diagnosis Date  . Acid reflux   . Anxiety   . Arthritis   . CHF (congestive heart failure) ([place])   . Depression   . Depression   . Hyperlipidemia   . Hypertension   . Myocardial infarction   . Renal cancer ([place])   . Renal cell carcinoma ([place])   . Skin cancer     Past Surgical History:  Procedure Laterality Date  . BACK SURGERY    . CARDIAC CATHETERIZATION     with stent  . CYSTOSCOPY WITH BIOPSY  03/06/2016   Procedure: CYSTOSCOPY WITH BIOPSY;  Surgeon: [name], MD;  Location: [facility] ORS;  Service: Urology;;  . [name] WITH URETHRAL DILATATION N/A 03/06/2016   Procedure: CYSTOSCOPY WITH URETHRAL DILATATION WITH CATHETER PLACEMENT;  Surgeon: [name], MD;  Location: [facility] ORS;  Service: Urology;  Laterality: N/A;  . KNEE SURGERY Left   . LEG SURGERY Left   . TONSILLECTOMY       Family History  Problem Relation Age of Onset  . Hypertension Father   . Heart attack Father   . Stroke Sister   . Hypertension Brother   . Stroke Maternal Uncle     Social History:  reports that he has never smoked. He has never used smokeless tobacco. He reports that he does not drink alcohol or use drugs.  He is originally from [place].  He then lived in [place] for 17 years.  He moved to [place] with his wife.  The patient is alone today.  Allergies: No Known Allergies  Current Medications: Current Outpatient Prescriptions  Medication Sig Dispense Refill  . amLODipine (NORVASC) 2.5 MG tablet TAKE 1 TABLET ONCE A DAY FOR 30 DAYS  2  . atorvastatin (LIPITOR) 20 MG tablet TAKE 1 TABLET ONCE A DAY (AT BEDTIME) FOR 30 DAYS  2  . busPIRone (BUSPAR) 5 MG tablet TAKE 1 TABLET 3 TIMES A DAY FOR 30 DAYS  2  . carvedilol (COREG) 12.5 MG tablet TAKE 1 TABLET BY MOUTH TWICE A DAY FOR 30 DAYS  3  . gabapentin (NEURONTIN) 300 MG capsule Take 300 mg by mouth 3 (three) times daily.     Marland Kitchen ibuprofen (ADVIL,MOTRIN) 200 MG tablet Take 200 mg by mouth every 8 (eight) hours as needed for moderate pain. 4 every 8 hours    . lisinopril (PRINIVIL,ZESTRIL) 20 MG tablet TAKE 1 TABLET  ONCE A DAY FOR 30 DAYS  3  . morphine (MS CONTIN) 15 MG 12 hr tablet Take 15 mg by mouth every 12 (twelve) hours.  0  . Nivolumab (OPDIVO IV) Inject into the vein.    Marland Kitchen oxyCODONE (OXY IR/ROXICODONE) 5 MG immediate release tablet Take 5 mg by mouth every 8 (eight) hours as needed (patient takes 2 tabs in the evening only).     Marland Kitchen senna (SENOKOT) 8.6 MG tablet Take 2 tablets by mouth at bedtime.     . sertraline (ZOLOFT) 100 MG tablet Take 100 mg by mouth at bedtime.  1  . traZODone (DESYREL) 50 MG tablet Take 50 mg by mouth at bedtime.   3  . triamcinolone cream (KENALOG) 0.5 % Apply topically 2 (two) times daily. 30 g 1  . XARELTO 20 MG TABS tablet Take 20 mg by mouth daily.  1   No current facility-administered  medications for this visit.     Review of Systems:  GENERAL:  Feels fine today.  No fevers or sweats.  Weight up 9 pounds. PERFORMANCE STATUS (ECOG):  1 HEENT:  No visual changes, runny nose, sore throat, mouth sores or tenderness.  Waiting until after the first of next year about decision to fix teeth. Lungs:  No shortness of breath, cough or wheezing.  No hemoptysis. Cardiac:  No chest pain, palpitations, orthopnea, or PND.  Dizzy when bending over. GI:  Diarrhea, resolved.  No nausea, vomiting, constipation, melena or hematochezia. GU:  Voiding well.  No urgency, frequency, dysuria, or hematuria. Musculoskeletal:  Femur issues (surgery at [facility] on 06/16/2016).  Joint pain until back on pain medications.  No muscle tenderness. Extremities:  Chronic left lower extremity swelling. Skin:  Eczema worse. Neuro:  "Memory sucks".  No headache, numbness or weakness.  Decreased feeling left leg (chronic). Endocrine:  No diabetes, thyroid issues, hot flashes or night sweats. Psych:  No mood changes, depression or anxiety. Pain:  No focal pain. Review of systems:  All other systems reviewed and found to be negative.  Physical Exam: Blood pressure (!) 84/57, pulse 69, temperature (!) 96.9 F (36.1 C), resp. rate 18, weight 250 lb (113.4 kg). GENERAL:  Well developed, well nourished, gentleman sitting comfortably in the exam room in no acute distress.  He has a cane at his side. MENTAL STATUS:  Alert and oriented to person, place and time. HEAD:  Long gray hair with goatee.  Normocephalic, atraumatic, face symmetric, no Cushingoid features. EYES:  Pupils equal round and reactive to light and accomodation.  No conjunctivitis or scleral icterus. ENT:  Oropharynx clear without lesion.  Poor dentition.  Tongue normal. Mucous membranes moist.  RESPIRATORY:  Clear to auscultation without rales, wheezes or rhonchi. CARDIOVASCULAR:  Regular rate and rhythm without murmur, rub or gallop.  No JVD. ABDOMEN:   Soft, non-tender, with active bowel sounds, and no hepatosplenomegaly.  No masses. SKIN:  Tattoos. Eczema on dorsal surface of forearms and lower back. EXTREMITIES: Minimal left lower extremity edema.  No skin discoloration or tenderness.  No palpable cords. LYMPH NODES: No palpable cervical, supraclavicular, axillary or inguinal adenopathy  NEUROLOGICAL:  Stable.  No change in ambulation. PSYCH:  Appropriate.  Marland Kitchen  Appointment on 05/26/2016  Component Date Value Ref Range Status  . WBC 05/26/2016 7.2  3.8 - 10.6 K/uL Final  . RBC 05/26/2016 3.55* 4.40 - 5.90 MIL/uL Final  . Hemoglobin 05/26/2016 9.1* 13.0 - 18.0 g/dL Final  . HCT 05/26/2016 28.6* 40.0 - 52.0 %  Final  . MCV 05/26/2016 80.4  80.0 - 100.0 fL Final  . MCH 05/26/2016 25.7* 26.0 - 34.0 pg Final  . MCHC 05/26/2016 31.9* 32.0 - 36.0 g/dL Final  . RDW 05/26/2016 16.8* 11.5 - 14.5 % Final  . Platelets 05/26/2016 283  150 - 440 K/uL Final  . Neutrophils Relative % 05/26/2016 72  % Final  . Neutro Abs 05/26/2016 5.1  1.4 - 6.5 K/uL Final  . Lymphocytes Relative 05/26/2016 12  % Final  . Lymphs Abs 05/26/2016 0.9* 1.0 - 3.6 K/uL Final  . Monocytes Relative 05/26/2016 12  % Final  . Monocytes Absolute 05/26/2016 0.9  0.2 - 1.0 K/uL Final  . Eosinophils Relative 05/26/2016 3  % Final  . Eosinophils Absolute 05/26/2016 0.2  0 - 0.7 K/uL Final  . Basophils Relative 05/26/2016 1  % Final  . Basophils Absolute 05/26/2016 0.0  0 - 0.1 K/uL Final  . Sodium 05/26/2016 137  135 - 145 mmol/L Final  . Potassium 05/26/2016 4.3  3.5 - 5.1 mmol/L Final  . Chloride 05/26/2016 103  101 - 111 mmol/L Final  . CO2 05/26/2016 26  22 - 32 mmol/L Final  . Glucose, Bld 05/26/2016 141* 65 - 99 mg/dL Final  . BUN 05/26/2016 32* 6 - 20 mg/dL Final  . Creatinine, Ser 05/26/2016 1.50* 0.61 - 1.24 mg/dL Final  . Calcium 05/26/2016 8.8* 8.9 - 10.3 mg/dL Final  . Total Protein 05/26/2016 7.6  6.5 - 8.1 g/dL Final  . Albumin 05/26/2016 3.6  3.5 - 5.0 g/dL  Final  . AST 05/26/2016 15  15 - 41 U/L Final  . ALT 05/26/2016 16* 17 - 63 U/L Final  . Alkaline Phosphatase 05/26/2016 66  38 - 126 U/L Final  . Total Bilirubin 05/26/2016 0.4  0.3 - 1.2 mg/dL Final  . GFR calc non Af Amer 05/26/2016 49* >60 mL/min Final  . GFR calc Af Amer 05/26/2016 57* >60 mL/min Final   Comment: (NOTE) The eGFR has been calculated using the CKD EPI equation. This calculation has not been validated in all clinical situations. eGFR's persistently <60 mL/min signify possible Chronic Kidney Disease.   . Anion gap 05/26/2016 8  5 - 15 Final  . Magnesium 05/26/2016 2.2  1.7 - 2.4 mg/dL Final    Assessment:  KOURTLAND COOPMAN is a 60 y.o. male with metastatic renal cell carcinoma presenting in 12/2013.  He had a large left renal mass, rib and vertebral body involvement, and multiple pulmonary nodules.  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    He was on Votrient (pazopanib) from 02/13/2014 - 01/21/2016.  He began nivolumab on 01/28/2016.  Abdominal and pelvic CT scan on 01/12/2014 revealed an 8.5 x 10.2 x 9.2 cm irregularly enhancing mass of the left kidney with some internal calcifications. There were enlarged left sided renal hilar lymph nodes. There were multiple pulmonary nodules in both lower lobes (largest 1.1 cm). There was a destructive bone lesion with an irregular 8.6 x 5.1 cm soft tissue mass invasive of the spinal canal narrowing at least 50% and disturbing the pedicle and portions of the left transverse process rib and T8 vertebral body.   Chest CT on 01/15/2014 revealed innumerable pulmonary nodules, a large metastatic lesion at T8 and left eighth rib with invasion into the spinal canal and moderate canal stenosis. There was a left upper pole right kidney mass.  Head MRI was negative.  He received  3000 cGy to T7 - T9 and associated ribs beginning 05/11/2014.  On 06/15/2014, he received 1 dose of nivolumab.  He  presented with progressive paralysis from the waist down on 07/06/2014.  He underwent left posterolateral thoracotomy with left chest wall resection with T8 corpectomy and interbody fusion with donor bone on 07/06/2014.  Pathology revealed metastatic renal cell carcinoma. Post-operatively, he developed sepsis secondary to a submandibular abscess dental abscess.  He underwent tracheostomy, I&D of a right neck abscess, tooth extraction, and PEG tube placement.  Zometa has subsequently been on hold.  He developed a pathologic fracture of the left femur.  He is s/p intramedullary nailing on 01/27/2015.  He received radiation to the left leg.  He was admitted on 04/26/2015 with aspiration pneumonia and opioid overdose. He is currently taking MSContin 15 mg BID and oxycodone 5 mg q day.  Chest, abdomen, and pelvic CT scan on 05/31/2015 revealed slight enlargement of multiple pulmonary nodules.  The left renal mass was similar (12.2 x 9.6 cm).  The right kidney lesion was stable (lower pole lesion 2 x 1.7 cm).  Bone scan on 05/31/2015 revealed slight increased activity in the left costal vertebral junction at T7.  Chest, abdomen, and pelvic CT scan on 12/14/2015 revealed an irregular 10.7 cm heterogeneous renal cortical mass in the upper left kidney and a heterogeneous 2.3 cm renal cortical mass in the lateral lower right kidney.  There was mild bilateral hydroureteronephrosis with bladder distention.  There was mild left para-aortic lymphadenopathy.  There were numerous (greater than 20) pulmonary metastases.  Largest nodule was 2.1 cm in the RLL. There was a partially visualized mildly expansile lytic lesion in the left proximal femur status post surgical transfixation.  Thre was a small probably loculated pleural effusion at the left eighth rib resection site.  There were no other bone abnormalities.  Bone scan on 12/23/2015 revealed photopenia from prior removal of the left eighth rib. There was increased   uptake in the medial thoracic spine from T7 and T9 is felt to be due to postoperative fusion.  There was increased uptake throughout much of the left femur as well as increased uptake in the soft tissues of much of the left femur which may be due to previous surgery and radiation therapy change.  He developed a left lower extremity DVT (popliteal and soleal vein) on 06/07/2015.  Left lower extremity ultrasound on 02/07/2016 revealed no evidence of DVT.  He is on long term Xarelto.  Patient has a long standing history of an obstructive uropathy and elevated PVR.  He has undergone urethral dilatation on several occasions.  He underwent cystoscopy, balloon urethral dilatation, and bladder biopsy with Foley catheter placement on 03/06/2016.  Bladder biopsy revealed cystitis with reactive changes, negative for atypia and malignancy.  He is s/p 7 cycles of nivolumab (01/28/2016 - 04/28/2016).  He tolerated his infusions well.  TSH was 6.239 (0.35-4.50) with a normal free T4 (0.79) on 02/25/2016.   TSH was 2.713 (normal) and free T4 0.79 (normal) on 05/19/2016.  Treatment was held on 05/19/2016 secondary to diarrhea.  He was admitted to [facility] from 02/14/2016 - 02/16/2016 with chest pain.  Chest CT angiogram on 02/15/2016 ruled out pulmonary embolism. Imaging revealed slight progression of mediastinal left hilar adenopathy was pulmonary metastatic disease. Stress test on 02/15/2016 ruled out cardiac ischemia.  He was treated with Augmentin for presumed lower extremity cellulitis.  Chest CT on 03/10/2016 revealed no evidence of pneumonia or pneumonitis.  There was  left mild atelectasis small effusion similar to prior.  There were bilateral multiple metastasis (stable).   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  There is possible increase in change in the lytic lesion with some movement at the fracture site.  He has broken the distal locking screw.  He ambulates with a rolling walker.  He is  scheduled for surgery at [facility] on 06/16/2016.  He has restrictive lung disease secondary to his weight and prior radiation.  He has been prescribed oxygen (not received).    His psoriasis flared on nivolumab, but has improved with topical steroids.  He is postponing dental assessment until after 06/19/2016 Elie Confer on hold).  Symptomatically, his diarrhea resolved.  He has new hypotension.  He is on 3 anti-hypertensive medications (amlodipine, carvedilol, and lisinopril).  Plan: 1.  Labs today:  CBC with diff, CMP, Mg. 2.  Discuss concern for possible adrenal insufficiency secondary to nivolumab.  Patient previously had been on stable dose of anti-hypertensive medications.  No dehydration. 3.  Add: cortisol, ACTH. 4.  No treatment today. 5.  RTC in 1 week for MD assessment, labs (CBC with diff, CMP, Mg) and cycle #8 Opdivo.  Addendum:  Spoke with [name].  BP medications will be held.  BP will be followed in the outpatient department with reinstitution of carvedilol at 1/2 tablet BID if systolic BP > 941 or pulse > 100.   [name], MD  05/26/2016 , 9:32 AM

## 2016-05-26 NOTE — Telephone Encounter (Signed)
Spoke to pt after [name] called and spoke to [name] at [facility].  His b/p was low today and pt felt a little lightheaded.  After the doctors spoke [name] said that pt stop the coreg, amlodopine, and lisinipril.  Take his b/p twice a day and if top number is over 140 or pulse is over 100. Then he will start back the coreg 1/2 tablet twice a day.  He will also see [name] on 12/11 at 3:45.  I went over all this info above and pt willing to go to [facility] and have it checked or he may even buy a cuff at the drugstore to check it and he is agreeable to the md appt next week to see PCP.

## 2016-05-26 NOTE — Patient Instructions (Addendum)
Hold all BP medicines: Coreg, Lisinopril and Amlodipine  Check your Blood Pressure and Pulse twice daily and keep record, if BP (top Number) over 140 or pulse over 100 restart coreg at 1/2 tablet twice daily.    Appointment to see Dr. Doy Mince Monday 12-11 at 3:45 pm

## 2016-05-29 ENCOUNTER — Inpatient Hospital Stay: Payer: 59

## 2016-05-29 ENCOUNTER — Other Ambulatory Visit: Payer: Self-pay

## 2016-05-29 DIAGNOSIS — C7800 Secondary malignant neoplasm of unspecified lung: Secondary | ICD-10-CM

## 2016-05-29 DIAGNOSIS — C642 Malignant neoplasm of left kidney, except renal pelvis: Secondary | ICD-10-CM | POA: Diagnosis not present

## 2016-05-29 DIAGNOSIS — C7951 Secondary malignant neoplasm of bone: Secondary | ICD-10-CM

## 2016-05-29 LAB — CBC WITH DIFFERENTIAL/PLATELET
Basophils Absolute: 0 10*3/uL (ref 0–0.1)
Basophils Relative: 1 %
Eosinophils Absolute: 0.3 10*3/uL (ref 0–0.7)
Eosinophils Relative: 4 %
HCT: 30.1 % — ABNORMAL LOW (ref 40.0–52.0)
Hemoglobin: 9.7 g/dL — ABNORMAL LOW (ref 13.0–18.0)
Lymphocytes Relative: 8 %
Lymphs Abs: 0.6 10*3/uL — ABNORMAL LOW (ref 1.0–3.6)
MCH: 25.4 pg — ABNORMAL LOW (ref 26.0–34.0)
MCHC: 32.1 g/dL (ref 32.0–36.0)
MCV: 79.1 fL — ABNORMAL LOW (ref 80.0–100.0)
Monocytes Absolute: 0.7 10*3/uL (ref 0.2–1.0)
Monocytes Relative: 10 %
Neutro Abs: 5.8 10*3/uL (ref 1.4–6.5)
Neutrophils Relative %: 77 %
Platelets: 273 10*3/uL (ref 150–440)
RBC: 3.8 MIL/uL — ABNORMAL LOW (ref 4.40–5.90)
RDW: 16.8 % — ABNORMAL HIGH (ref 11.5–14.5)
WBC: 7.5 10*3/uL (ref 3.8–10.6)

## 2016-05-29 LAB — COMPREHENSIVE METABOLIC PANEL
ALT: 12 U/L — ABNORMAL LOW (ref 17–63)
AST: 14 U/L — ABNORMAL LOW (ref 15–41)
Albumin: 3.6 g/dL (ref 3.5–5.0)
Alkaline Phosphatase: 75 U/L (ref 38–126)
Anion gap: 8 (ref 5–15)
BUN: 17 mg/dL (ref 6–20)
CO2: 29 mmol/L (ref 22–32)
Calcium: 8.9 mg/dL (ref 8.9–10.3)
Chloride: 101 mmol/L (ref 101–111)
Creatinine, Ser: 0.91 mg/dL (ref 0.61–1.24)
GFR calc Af Amer: >60 mL/min (ref >60)
GFR calc non Af Amer: >60 mL/min (ref >60)
Glucose, Bld: 104 mg/dL — ABNORMAL HIGH (ref 65–99)
Potassium: 4 mmol/L (ref 3.5–5.1)
Sodium: 138 mmol/L (ref 135–145)
Total Bilirubin: 0.5 mg/dL (ref 0.3–1.2)
Total Protein: 7.9 g/dL (ref 6.5–8.1)

## 2016-05-29 LAB — MAGNESIUM: Magnesium: 2 mg/dL (ref 1.7–2.4)

## 2016-05-29 LAB — ACTH: C206 ACTH: 39.1 pg/mL (ref 7.2–63.3)

## 2016-05-29 LAB — CORTISOL: Cortisol, Plasma: 10.3 ug/dL

## 2016-05-29 NOTE — Telephone Encounter (Signed)
Later on 12/8 after 5 pm dr Mike Gip wanted me to call pt and get him in here 12/11 at 8:15 to get cortisol repeated and I called him and left this message on his voicemail and he did come in 12/11 and get blood work done

## 2016-05-30 ENCOUNTER — Other Ambulatory Visit: Payer: Self-pay | Admitting: *Deleted

## 2016-05-30 ENCOUNTER — Telehealth: Payer: Self-pay | Admitting: *Deleted

## 2016-05-30 DIAGNOSIS — C649 Malignant neoplasm of unspecified kidney, except renal pelvis: Secondary | ICD-10-CM

## 2016-05-30 NOTE — Telephone Encounter (Signed)
-----   Message from [name], MD sent at 05/30/2016  3:38 AM EST ----- Regarding: Please call patient   Cortisol level is good.  How is blood pressure?  M  ----- Message ----- From: Interface, Lab In [place] Sent: 05/29/2016   8:48 AM To: [name], MD

## 2016-05-30 NOTE — Telephone Encounter (Signed)
Called patient's phone numbers and unable to get call through.  Called spouse's cell phone and lvm that cortisol levels are good.  Asked patient to call us back if he continues to have problems with BP.

## 2016-06-02 ENCOUNTER — Ambulatory Visit: Payer: 59

## 2016-06-02 ENCOUNTER — Ambulatory Visit: Payer: 59 | Admitting: Hematology and Oncology

## 2016-06-02 ENCOUNTER — Other Ambulatory Visit: Payer: 59

## 2016-06-02 ENCOUNTER — Telehealth: Payer: Self-pay | Admitting: *Deleted

## 2016-06-02 ENCOUNTER — Other Ambulatory Visit: Payer: Self-pay | Admitting: Hematology and Oncology

## 2016-06-02 ENCOUNTER — Inpatient Hospital Stay: Payer: 59

## 2016-06-02 ENCOUNTER — Inpatient Hospital Stay (HOSPITAL_BASED_OUTPATIENT_CLINIC_OR_DEPARTMENT_OTHER): Payer: 59 | Admitting: Hematology and Oncology

## 2016-06-02 VITALS — BP 89/57 | HR 66

## 2016-06-02 VITALS — BP 76/48 | HR 65 | Temp 96.5°F | Resp 18 | Wt 249.6 lb

## 2016-06-02 DIAGNOSIS — C7951 Secondary malignant neoplasm of bone: Secondary | ICD-10-CM | POA: Diagnosis not present

## 2016-06-02 DIAGNOSIS — C649 Malignant neoplasm of unspecified kidney, except renal pelvis: Secondary | ICD-10-CM

## 2016-06-02 DIAGNOSIS — I252 Old myocardial infarction: Secondary | ICD-10-CM

## 2016-06-02 DIAGNOSIS — F329 Major depressive disorder, single episode, unspecified: Secondary | ICD-10-CM

## 2016-06-02 DIAGNOSIS — M7989 Other specified soft tissue disorders: Secondary | ICD-10-CM

## 2016-06-02 DIAGNOSIS — Z8781 Personal history of (healed) traumatic fracture: Secondary | ICD-10-CM

## 2016-06-02 DIAGNOSIS — Z85828 Personal history of other malignant neoplasm of skin: Secondary | ICD-10-CM

## 2016-06-02 DIAGNOSIS — F419 Anxiety disorder, unspecified: Secondary | ICD-10-CM

## 2016-06-02 DIAGNOSIS — R42 Dizziness and giddiness: Secondary | ICD-10-CM

## 2016-06-02 DIAGNOSIS — N3289 Other specified disorders of bladder: Secondary | ICD-10-CM

## 2016-06-02 DIAGNOSIS — I951 Orthostatic hypotension: Secondary | ICD-10-CM

## 2016-06-02 DIAGNOSIS — C642 Malignant neoplasm of left kidney, except renal pelvis: Secondary | ICD-10-CM | POA: Diagnosis not present

## 2016-06-02 DIAGNOSIS — Z79899 Other long term (current) drug therapy: Secondary | ICD-10-CM

## 2016-06-02 DIAGNOSIS — R634 Abnormal weight loss: Secondary | ICD-10-CM

## 2016-06-02 DIAGNOSIS — R197 Diarrhea, unspecified: Secondary | ICD-10-CM

## 2016-06-02 DIAGNOSIS — Z87891 Personal history of nicotine dependence: Secondary | ICD-10-CM

## 2016-06-02 DIAGNOSIS — Z923 Personal history of irradiation: Secondary | ICD-10-CM

## 2016-06-02 DIAGNOSIS — C7800 Secondary malignant neoplasm of unspecified lung: Secondary | ICD-10-CM | POA: Diagnosis not present

## 2016-06-02 DIAGNOSIS — I10 Essential (primary) hypertension: Secondary | ICD-10-CM

## 2016-06-02 DIAGNOSIS — I82532 Chronic embolism and thrombosis of left popliteal vein: Secondary | ICD-10-CM

## 2016-06-02 DIAGNOSIS — Z86718 Personal history of other venous thrombosis and embolism: Secondary | ICD-10-CM

## 2016-06-02 DIAGNOSIS — R918 Other nonspecific abnormal finding of lung field: Secondary | ICD-10-CM

## 2016-06-02 DIAGNOSIS — I959 Hypotension, unspecified: Secondary | ICD-10-CM

## 2016-06-02 DIAGNOSIS — I509 Heart failure, unspecified: Secondary | ICD-10-CM

## 2016-06-02 DIAGNOSIS — N1330 Unspecified hydronephrosis: Secondary | ICD-10-CM

## 2016-06-02 DIAGNOSIS — L409 Psoriasis, unspecified: Secondary | ICD-10-CM

## 2016-06-02 DIAGNOSIS — Z8701 Personal history of pneumonia (recurrent): Secondary | ICD-10-CM

## 2016-06-02 DIAGNOSIS — K219 Gastro-esophageal reflux disease without esophagitis: Secondary | ICD-10-CM

## 2016-06-02 DIAGNOSIS — M129 Arthropathy, unspecified: Secondary | ICD-10-CM

## 2016-06-02 DIAGNOSIS — E785 Hyperlipidemia, unspecified: Secondary | ICD-10-CM

## 2016-06-02 LAB — CBC WITH DIFFERENTIAL/PLATELET
Basophils Absolute: 0.1 10*3/uL (ref 0–0.1)
Basophils Relative: 1 %
Eosinophils Absolute: 0.2 10*3/uL (ref 0–0.7)
Eosinophils Relative: 3 %
HCT: 28.9 % — ABNORMAL LOW (ref 40.0–52.0)
Hemoglobin: 9.1 g/dL — ABNORMAL LOW (ref 13.0–18.0)
Lymphocytes Relative: 12 %
Lymphs Abs: 0.8 10*3/uL — ABNORMAL LOW (ref 1.0–3.6)
MCH: 25.2 pg — ABNORMAL LOW (ref 26.0–34.0)
MCHC: 31.6 g/dL — ABNORMAL LOW (ref 32.0–36.0)
MCV: 79.6 fL — ABNORMAL LOW (ref 80.0–100.0)
Monocytes Absolute: 0.7 10*3/uL (ref 0.2–1.0)
Monocytes Relative: 11 %
Neutro Abs: 4.9 10*3/uL (ref 1.4–6.5)
Neutrophils Relative %: 73 %
Platelets: 261 10*3/uL (ref 150–440)
RBC: 3.63 MIL/uL — ABNORMAL LOW (ref 4.40–5.90)
RDW: 17.6 % — ABNORMAL HIGH (ref 11.5–14.5)
WBC: 6.8 10*3/uL (ref 3.8–10.6)

## 2016-06-02 LAB — COMPREHENSIVE METABOLIC PANEL
ALT: 9 U/L — ABNORMAL LOW (ref 17–63)
AST: 12 U/L — ABNORMAL LOW (ref 15–41)
Albumin: 3.5 g/dL (ref 3.5–5.0)
Alkaline Phosphatase: 68 U/L (ref 38–126)
Anion gap: 7 (ref 5–15)
BUN: 27 mg/dL — ABNORMAL HIGH (ref 6–20)
CO2: 29 mmol/L (ref 22–32)
Calcium: 9 mg/dL (ref 8.9–10.3)
Chloride: 101 mmol/L (ref 101–111)
Creatinine, Ser: 1.24 mg/dL (ref 0.61–1.24)
GFR calc Af Amer: >60 mL/min (ref >60)
GFR calc non Af Amer: >60 mL/min (ref >60)
Glucose, Bld: 129 mg/dL — ABNORMAL HIGH (ref 65–99)
Potassium: 3.9 mmol/L (ref 3.5–5.1)
Sodium: 137 mmol/L (ref 135–145)
Total Bilirubin: 0.5 mg/dL (ref 0.3–1.2)
Total Protein: 7.6 g/dL (ref 6.5–8.1)

## 2016-06-02 LAB — MAGNESIUM: Magnesium: 2 mg/dL (ref 1.7–2.4)

## 2016-06-02 MED ORDER — SODIUM CHLORIDE 0.9 % IV SOLN
INTRAVENOUS | Status: AC
  Administered 2016-06-02: 12:00:00 via INTRAVENOUS
  Filled 2016-06-02: qty 1000

## 2016-06-02 NOTE — Progress Notes (Signed)
Patient states his right leg is giving him problems when he stands and tries to walk.  He has to take about 6 steps before he can really get going. Has a lot of joint pain.  Having problems sleeping.  Otherwise, no complaints.  Patient's BP sitting - 91/52  HR 69   Standing 76/48  Hr 65.  Patient asymptomatic.  States he feels good today.   Patient states his PCP started him back on Coreg.  Amlodipine and lisinopril on hold.

## 2016-06-02 NOTE — Progress Notes (Addendum)
[facility] day:  06/02/16   Chief Complaint: Marc Blake is a 60 y.o. male with metastatic renal cell carcinoma who is seen for assessment prior to cycle #8 nivolumab.  HPI: The patient was last seen in the medical oncology clinic on 05/26/2016.  At that time, treatment was held secondary to unexplained hypotension.  He was on 3 blood pressure medication.  Medications were held.  Cortisol level was 7.9 with a repeat of 10.3 (normal) on 05/29/2016 at 8:26 AM.  ACTH was 39.1 (normal) at 10 AM on 05/26/2016.  He states that his blood pressure was 163/87 on 05/29/2016. He has been back on his Coreg and has felt good.  He worked this week. He feels lightheaded today.  He comments that he was feeling great until now.  He also notes arthritis on the right side of his body including his right shoulder, elbow, wrist, and 2 fingers.  He has no joint symptoms on the left side except his left index finger. He states that his left leg is feeling 10 times better than his right leg which is unusual as his problems are in his left leg.  Hee is scheduled to have surgery on his left leg on 06/16/2016.   Past Medical History:  Diagnosis Date  . Acid reflux   . Anxiety   . Arthritis   . CHF (congestive heart failure) ([place])   . Depression   . Depression   . Hyperlipidemia   . Hypertension   . Myocardial infarction   . Renal cancer ([place])   . Renal cell carcinoma ([place])   . Skin cancer     Past Surgical History:  Procedure Laterality Date  . BACK SURGERY    . CARDIAC CATHETERIZATION     with stent  . CYSTOSCOPY WITH BIOPSY  03/06/2016   Procedure: CYSTOSCOPY WITH BIOPSY;  Surgeon: [name], MD;  Location: [facility] ORS;  Service: Urology;;  . [name] WITH URETHRAL DILATATION N/A 03/06/2016   Procedure: CYSTOSCOPY WITH URETHRAL DILATATION WITH CATHETER PLACEMENT;  Surgeon: [name], MD;  Location: [facility] ORS;  Service: Urology;  Laterality: N/A;  .  KNEE SURGERY Left   . LEG SURGERY Left   . TONSILLECTOMY      Family History  Problem Relation Age of Onset  . Hypertension Father   . Heart attack Father   . Stroke Sister   . Hypertension Brother   . Stroke Maternal Uncle     Social History:  reports that he has never smoked. He has never used smokeless tobacco. He reports that he does not drink alcohol or use drugs.  He is originally from [place].  He then lived in [place] for 17 years.  He moved to [place] with his wife.  The patient is alone today.  Allergies: No Known Allergies  Current Medications: Current Outpatient Prescriptions  Medication Sig Dispense Refill  . atorvastatin (LIPITOR) 20 MG tablet TAKE 1 TABLET ONCE A DAY (AT BEDTIME) FOR 30 DAYS  2  . busPIRone (BUSPAR) 5 MG tablet TAKE 1 TABLET 3 TIMES A DAY FOR 30 DAYS  2  . carvedilol (COREG) 12.5 MG tablet TAKE 1 TABLET BY MOUTH TWICE A DAY FOR 30 DAYS  3  . gabapentin (NEURONTIN) 300 MG capsule Take 300 mg by mouth 3 (three) times daily.     Marland Kitchen ibuprofen (ADVIL,MOTRIN) 200 MG tablet Take 200 mg by mouth every 8 (eight) hours as needed for moderate pain. 4  every 8 hours    . morphine (MS CONTIN) 15 MG 12 hr tablet Take 15 mg by mouth every 12 (twelve) hours.  0  . Nivolumab (OPDIVO IV) Inject into the vein.    Marland Kitchen oxyCODONE (OXY IR/ROXICODONE) 5 MG immediate release tablet Take 5 mg by mouth every 8 (eight) hours as needed (patient takes 2 tabs in the evening only).     Marland Kitchen senna (SENOKOT) 8.6 MG tablet Take 2 tablets by mouth at bedtime.     . sertraline (ZOLOFT) 100 MG tablet Take 100 mg by mouth at bedtime.  1  . traZODone (DESYREL) 50 MG tablet Take 50 mg by mouth at bedtime.   3  . triamcinolone cream (KENALOG) 0.5 % Apply topically 2 (two) times daily. 30 g 1  . XARELTO 20 MG TABS tablet Take 20 mg by mouth daily.  1  . amLODipine (NORVASC) 2.5 MG tablet TAKE 1 TABLET ONCE A DAY FOR 30 DAYS  2  . lisinopril (PRINIVIL,ZESTRIL) 20 MG tablet TAKE 1 TABLET ONCE A  DAY FOR 30 DAYS  3   No current facility-administered medications for this visit.     Review of Systems:  GENERAL:  Feels lightheaded.  No fevers or sweats.  Weight down 1 pound. PERFORMANCE STATUS (ECOG):  1 HEENT:  No visual changes, runny nose, sore throat, mouth sores or tenderness.  Waiting until after 06/19/2016 about decision to fix teeth. Lungs:  No shortness of breath, cough or wheezing.  No hemoptysis. Cardiac:  No chest pain, palpitations, orthopnea, or PND. GI:  No nausea, vomiting, diarrhea, constipation, melena or hematochezia. GU:  Voiding well.  No urgency, frequency, dysuria, or hematuria. Musculoskeletal:  Femur issues (surgery at [facility] on 06/16/2016).  Joint pain on left side (see HPI).  No muscle tenderness. Extremities:  Chronic left lower extremity swelling. Skin:  Eczema on elbows and back. Neuro:  "Memory sucks".  No headache, numbness or weakness.  Decreased feeling left leg (chronic).  Left leg feels better than right leg. Endocrine:  No diabetes, thyroid issues, hot flashes or night sweats. Psych:  No mood changes, depression or anxiety. Pain:  No focal pain. Review of systems:  All other systems reviewed and found to be negative.  Physical Exam: Blood pressure (!) 76/48, pulse 65, weight 249 lb 9 oz (113.2 kg). GENERAL:  Well developed, well nourished, slightly fatigued appearing gentleman sitting comfortably in the exam room in no acute distress.  He has a cane at his side. MENTAL STATUS:  Alert and oriented to person, place and time. HEAD:  Long gray hair with goatee.  Normocephalic, atraumatic, face symmetric, no Cushingoid features. EYES:  Pupils equal round and reactive to light and accomodation.  No conjunctivitis or scleral icterus. ENT:  Oropharynx clear without lesion.  Poor dentition.  Tongue normal. Mucous membranes moist.  RESPIRATORY:  Clear to auscultation without rales, wheezes or rhonchi. CARDIOVASCULAR:  Regular rate and rhythm without murmur,  rub or gallop.  No JVD. ABDOMEN:  Soft, non-tender, with active bowel sounds, and no hepatosplenomegaly.  No masses. SKIN:  Tattoos. Eczema on dorsal surface of forearms. Eczema on lower back. EXTREMITIES: Minimal left lower extremity edema.  No skin discoloration or tenderness.  No palpable cords. LYMPH NODES: No palpable cervical, supraclavicular, axillary or inguinal adenopathy  NEUROLOGICAL:  Stable.  No change in ambulation. PSYCH:  Appropriate.  Marland Kitchen  Appointment on 06/02/2016  Component Date Value Ref Range Status  . WBC 06/02/2016 6.8  3.8 - 10.6 K/uL  Final  . RBC 06/02/2016 3.63* 4.40 - 5.90 MIL/uL Final  . Hemoglobin 06/02/2016 9.1* 13.0 - 18.0 g/dL Final  . HCT 06/02/2016 28.9* 40.0 - 52.0 % Final  . MCV 06/02/2016 79.6* 80.0 - 100.0 fL Final  . MCH 06/02/2016 25.2* 26.0 - 34.0 pg Final  . MCHC 06/02/2016 31.6* 32.0 - 36.0 g/dL Final  . RDW 06/02/2016 17.6* 11.5 - 14.5 % Final  . Platelets 06/02/2016 261  150 - 440 K/uL Final  . Neutrophils Relative % 06/02/2016 73  % Final  . Neutro Abs 06/02/2016 4.9  1.4 - 6.5 K/uL Final  . Lymphocytes Relative 06/02/2016 12  % Final  . Lymphs Abs 06/02/2016 0.8* 1.0 - 3.6 K/uL Final  . Monocytes Relative 06/02/2016 11  % Final  . Monocytes Absolute 06/02/2016 0.7  0.2 - 1.0 K/uL Final  . Eosinophils Relative 06/02/2016 3  % Final  . Eosinophils Absolute 06/02/2016 0.2  0 - 0.7 K/uL Final  . Basophils Relative 06/02/2016 1  % Final  . Basophils Absolute 06/02/2016 0.1  0 - 0.1 K/uL Final  . Sodium 06/02/2016 137  135 - 145 mmol/L Final  . Potassium 06/02/2016 3.9  3.5 - 5.1 mmol/L Final  . Chloride 06/02/2016 101  101 - 111 mmol/L Final  . CO2 06/02/2016 29  22 - 32 mmol/L Final  . Glucose, Bld 06/02/2016 129* 65 - 99 mg/dL Final  . BUN 06/02/2016 27* 6 - 20 mg/dL Final  . Creatinine, Ser 06/02/2016 1.24  0.61 - 1.24 mg/dL Final  . Calcium 06/02/2016 9.0  8.9 - 10.3 mg/dL Final  . Total Protein 06/02/2016 7.6  6.5 - 8.1 g/dL Final  .  Albumin 06/02/2016 3.5  3.5 - 5.0 g/dL Final  . AST 06/02/2016 12* 15 - 41 U/L Final  . ALT 06/02/2016 9* 17 - 63 U/L Final  . Alkaline Phosphatase 06/02/2016 68  38 - 126 U/L Final  . Total Bilirubin 06/02/2016 0.5  0.3 - 1.2 mg/dL Final  . GFR calc non Af Amer 06/02/2016 >60  >60 mL/min Final  . GFR calc Af Amer 06/02/2016 >60  >60 mL/min Final   Comment: (NOTE) The eGFR has been calculated using the CKD EPI equation. This calculation has not been validated in all clinical situations. eGFR's persistently <60 mL/min signify possible Chronic Kidney Disease.   . Anion gap 06/02/2016 7  5 - 15 Final  . Magnesium 06/02/2016 2.0  1.7 - 2.4 mg/dL Final    Assessment:  ROBET CRUTCHFIELD is a 60 y.o. male with metastatic renal cell carcinoma presenting in 12/2013.  He had a large left renal mass, rib and vertebral body involvement, and multiple pulmonary nodules.  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    He was on Votrient (pazopanib) from 02/13/2014 - 01/21/2016.  He began nivolumab on 01/28/2016.  Abdominal and pelvic CT scan on 01/12/2014 revealed an 8.5 x 10.2 x 9.2 cm irregularly enhancing mass of the left kidney with some internal calcifications. There were enlarged left sided renal hilar lymph nodes. There were multiple pulmonary nodules in both lower lobes (largest 1.1 cm). There was a destructive bone lesion with an irregular 8.6 x 5.1 cm soft tissue mass invasive of the spinal canal narrowing at least 50% and disturbing the pedicle and portions of the left transverse process rib and T8 vertebral body.   Chest CT on 01/15/2014 revealed innumerable pulmonary nodules, a large metastatic lesion at  T8 and left eighth rib with invasion into the spinal canal and moderate canal stenosis. There was a left upper pole right kidney mass.  Head MRI was negative.  He received 3000 cGy to T7 - T9 and associated ribs beginning 05/11/2014.  On 06/15/2014,  he received 1 dose of nivolumab.  He presented with progressive paralysis from the waist down on 07/06/2014.  He underwent left posterolateral thoracotomy with left chest wall resection with T8 corpectomy and interbody fusion with donor bone on 07/06/2014.  Pathology revealed metastatic renal cell carcinoma. Post-operatively, he developed sepsis secondary to a submandibular abscess dental abscess.  He underwent tracheostomy, I&D of a right neck abscess, tooth extraction, and PEG tube placement.  Zometa has subsequently been on hold.  He developed a pathologic fracture of the left femur.  He is s/p intramedullary nailing on 01/27/2015.  He received radiation to the left leg.  He was admitted on 04/26/2015 with aspiration pneumonia and opioid overdose. He is currently taking MSContin 15 mg BID and oxycodone 5 mg q day.  Chest, abdomen, and pelvic CT scan on 05/31/2015 revealed slight enlargement of multiple pulmonary nodules.  The left renal mass was similar (12.2 x 9.6 cm).  The right kidney lesion was stable (lower pole lesion 2 x 1.7 cm).  Bone scan on 05/31/2015 revealed slight increased activity in the left costal vertebral junction at T7.  Chest, abdomen, and pelvic CT scan on 12/14/2015 revealed an irregular 10.7 cm heterogeneous renal cortical mass in the upper left kidney and a heterogeneous 2.3 cm renal cortical mass in the lateral lower right kidney.  There was mild bilateral hydroureteronephrosis with bladder distention.  There was mild left para-aortic lymphadenopathy.  There were numerous (greater than 20) pulmonary metastases.  Largest nodule was 2.1 cm in the RLL. There was a partially visualized mildly expansile lytic lesion in the left proximal femur status post surgical transfixation.  Thre was a small probably loculated pleural effusion at the left eighth rib resection site.  There were no other bone abnormalities.  Bone scan on 12/23/2015 revealed photopenia from prior removal of the  left eighth rib. There was increased  uptake in the medial thoracic spine from T7 and T9 is felt to be due to postoperative fusion.  There was increased uptake throughout much of the left femur as well as increased uptake in the soft tissues of much of the left femur which may be due to previous surgery and radiation therapy change.  He developed a left lower extremity DVT (popliteal and soleal vein) on 06/07/2015.  Left lower extremity ultrasound on 02/07/2016 revealed no evidence of DVT.  He is on long term Xarelto.  Patient has a long standing history of an obstructive uropathy and elevated PVR.  He has undergone urethral dilatation on several occasions.  He underwent cystoscopy, balloon urethral dilatation, and bladder biopsy with Foley catheter placement on 03/06/2016.  Bladder biopsy revealed cystitis with reactive changes, negative for atypia and malignancy.  He is s/p 7 cycles of nivolumab (01/28/2016 - 04/28/2016).  He tolerated his infusions well.  TSH was 6.239 (0.35-4.50) with a normal free T4 (0.79) on 02/25/2016.  TSH was 2.713 (normal) and free T4 0.79 (normal) on 05/19/2016.  Treatment was held on 05/19/2016 secondary to diarrhea and on 05/26/2016 secondary to unexplained hypotension.   Cortisol level was 7.9 with a repeat of 10.3 (normal) on 05/29/2016 at 8:26 AM.  ACTH was 39.1 (normal) at 10 AM on 05/26/2016.  He was admitted to  [facility] from 02/14/2016 - 02/16/2016 with chest pain.  Chest CT angiogram on 02/15/2016 ruled out pulmonary embolism. Imaging revealed slight progression of mediastinal left hilar adenopathy was pulmonary metastatic disease. Stress test on 02/15/2016 ruled out cardiac ischemia.  He was treated with Augmentin for presumed lower extremity cellulitis.  Chest CT on 03/10/2016 revealed no evidence of pneumonia or pneumonitis.  There was left mild atelectasis small effusion similar to prior.  There were bilateral multiple metastasis (stable).   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  There is possible increase in change in the lytic lesion with some movement at the fracture site.  He has broken the distal locking screw.  He ambulates with a rolling walker.  He is scheduled for [facility] at [facility] on 06/16/2016.  He has restrictive lung disease secondary to his weight and prior radiation.  He has been prescribed oxygen (not received).    His psoriasis flared on nivolumab, but has improved with topical steroids.  He is postponing dental assessment until after 06/19/2016 Elie Confer on hold).  Symptomatically, he has persistent unexplained hypotension.  He has been back on his Coreg.  Plan: 1.  Labs today:  CBC with diff, CMP, Mg. 2.  Review work-up from last clinic visit.  No evidence of hypophysitis (ACTH normal) or adrenal insufficiency by AM cortisol level (could consider Cortrosyn stimulation test).  Consider cardiac abnormality.  Contact patient's cardiologist, Dr Nehemiah Massed.  Stop Coreg.  Patient to be seen today. 3.  IVF today- 500 cc then repeat orthostatics. 4.  Follow-up with cardiology today. 5.  RTC on 06/06/2016 for MD assessment, labs (CBC with diff, BMP), and +/- cycle #8 Opdivo.   [name], MD  06/02/2016 , 10:56 AM

## 2016-06-02 NOTE — Telephone Encounter (Signed)
Dr Clayborn Bigness and dr Mike Gip spoke about pt and his usual md is [facility] and he is covering.  He told corcoran on the phone that he would see him and I called to get the appt.  Betsy told me 2 pm.  I told her depending on how long it takes the fluids he may be late. She said to just call and let them know.  Someone will take him over in wheelchair and I asked if someone there could bring him back.  They are agreeable.

## 2016-06-05 ENCOUNTER — Other Ambulatory Visit: Payer: Self-pay | Admitting: *Deleted

## 2016-06-05 DIAGNOSIS — C642 Malignant neoplasm of left kidney, except renal pelvis: Secondary | ICD-10-CM

## 2016-06-05 DIAGNOSIS — C7800 Secondary malignant neoplasm of unspecified lung: Secondary | ICD-10-CM

## 2016-06-05 DIAGNOSIS — C7951 Secondary malignant neoplasm of bone: Secondary | ICD-10-CM

## 2016-06-06 ENCOUNTER — Encounter: Payer: Self-pay | Admitting: Oncology

## 2016-06-06 ENCOUNTER — Encounter: Payer: Self-pay | Admitting: Hematology and Oncology

## 2016-06-06 ENCOUNTER — Encounter: Payer: Self-pay | Admitting: Urology

## 2016-06-06 ENCOUNTER — Other Ambulatory Visit: Payer: Self-pay | Admitting: Oncology

## 2016-06-06 ENCOUNTER — Inpatient Hospital Stay: Payer: 59

## 2016-06-06 ENCOUNTER — Inpatient Hospital Stay (HOSPITAL_BASED_OUTPATIENT_CLINIC_OR_DEPARTMENT_OTHER): Payer: 59 | Admitting: Oncology

## 2016-06-06 ENCOUNTER — Ambulatory Visit: Payer: 59 | Admitting: Urology

## 2016-06-06 VITALS — BP 114/70 | HR 75 | Temp 97.9°F | Ht 75.0 in | Wt 248.0 lb

## 2016-06-06 VITALS — BP 141/66 | HR 109 | Temp 99.6°F | Ht 75.0 in | Wt 250.0 lb

## 2016-06-06 DIAGNOSIS — I10 Essential (primary) hypertension: Secondary | ICD-10-CM

## 2016-06-06 DIAGNOSIS — N3000 Acute cystitis without hematuria: Secondary | ICD-10-CM

## 2016-06-06 DIAGNOSIS — Z923 Personal history of irradiation: Secondary | ICD-10-CM

## 2016-06-06 DIAGNOSIS — I509 Heart failure, unspecified: Secondary | ICD-10-CM

## 2016-06-06 DIAGNOSIS — R634 Abnormal weight loss: Secondary | ICD-10-CM

## 2016-06-06 DIAGNOSIS — C7800 Secondary malignant neoplasm of unspecified lung: Secondary | ICD-10-CM

## 2016-06-06 DIAGNOSIS — Z87891 Personal history of nicotine dependence: Secondary | ICD-10-CM

## 2016-06-06 DIAGNOSIS — F329 Major depressive disorder, single episode, unspecified: Secondary | ICD-10-CM

## 2016-06-06 DIAGNOSIS — N1330 Unspecified hydronephrosis: Secondary | ICD-10-CM

## 2016-06-06 DIAGNOSIS — C7951 Secondary malignant neoplasm of bone: Secondary | ICD-10-CM

## 2016-06-06 DIAGNOSIS — Z79899 Other long term (current) drug therapy: Secondary | ICD-10-CM

## 2016-06-06 DIAGNOSIS — Z86718 Personal history of other venous thrombosis and embolism: Secondary | ICD-10-CM

## 2016-06-06 DIAGNOSIS — R339 Retention of urine, unspecified: Secondary | ICD-10-CM

## 2016-06-06 DIAGNOSIS — C642 Malignant neoplasm of left kidney, except renal pelvis: Secondary | ICD-10-CM

## 2016-06-06 DIAGNOSIS — Z85828 Personal history of other malignant neoplasm of skin: Secondary | ICD-10-CM

## 2016-06-06 DIAGNOSIS — R197 Diarrhea, unspecified: Secondary | ICD-10-CM

## 2016-06-06 DIAGNOSIS — N390 Urinary tract infection, site not specified: Secondary | ICD-10-CM | POA: Diagnosis not present

## 2016-06-06 DIAGNOSIS — R42 Dizziness and giddiness: Secondary | ICD-10-CM

## 2016-06-06 DIAGNOSIS — R918 Other nonspecific abnormal finding of lung field: Secondary | ICD-10-CM

## 2016-06-06 DIAGNOSIS — T402X1A Poisoning by other opioids, accidental (unintentional), initial encounter: Secondary | ICD-10-CM

## 2016-06-06 DIAGNOSIS — N400 Enlarged prostate without lower urinary tract symptoms: Secondary | ICD-10-CM

## 2016-06-06 DIAGNOSIS — Z8701 Personal history of pneumonia (recurrent): Secondary | ICD-10-CM

## 2016-06-06 DIAGNOSIS — I959 Hypotension, unspecified: Secondary | ICD-10-CM

## 2016-06-06 DIAGNOSIS — N99111 Postprocedural bulbous urethral stricture: Secondary | ICD-10-CM

## 2016-06-06 DIAGNOSIS — M129 Arthropathy, unspecified: Secondary | ICD-10-CM

## 2016-06-06 DIAGNOSIS — J690 Pneumonitis due to inhalation of food and vomit: Secondary | ICD-10-CM

## 2016-06-06 DIAGNOSIS — F419 Anxiety disorder, unspecified: Secondary | ICD-10-CM

## 2016-06-06 DIAGNOSIS — Z8781 Personal history of (healed) traumatic fracture: Secondary | ICD-10-CM

## 2016-06-06 DIAGNOSIS — K219 Gastro-esophageal reflux disease without esophagitis: Secondary | ICD-10-CM

## 2016-06-06 DIAGNOSIS — I252 Old myocardial infarction: Secondary | ICD-10-CM

## 2016-06-06 DIAGNOSIS — E785 Hyperlipidemia, unspecified: Secondary | ICD-10-CM

## 2016-06-06 DIAGNOSIS — M7989 Other specified soft tissue disorders: Secondary | ICD-10-CM

## 2016-06-06 DIAGNOSIS — N3289 Other specified disorders of bladder: Secondary | ICD-10-CM

## 2016-06-06 DIAGNOSIS — L409 Psoriasis, unspecified: Secondary | ICD-10-CM

## 2016-06-06 LAB — COMPREHENSIVE METABOLIC PANEL
ALT: 11 U/L — ABNORMAL LOW (ref 17–63)
AST: 17 U/L (ref 15–41)
Albumin: 3.2 g/dL — ABNORMAL LOW (ref 3.5–5.0)
Alkaline Phosphatase: 71 U/L (ref 38–126)
Anion gap: 9 (ref 5–15)
BUN: 38 mg/dL — ABNORMAL HIGH (ref 6–20)
CO2: 27 mmol/L (ref 22–32)
Calcium: 9.1 mg/dL (ref 8.9–10.3)
Chloride: 101 mmol/L (ref 101–111)
Creatinine, Ser: 1.55 mg/dL — ABNORMAL HIGH (ref 0.61–1.24)
GFR calc Af Amer: 54 mL/min — ABNORMAL LOW (ref >60)
GFR calc non Af Amer: 47 mL/min — ABNORMAL LOW (ref >60)
Glucose, Bld: 138 mg/dL — ABNORMAL HIGH (ref 65–99)
Potassium: 3.8 mmol/L (ref 3.5–5.1)
Sodium: 137 mmol/L (ref 135–145)
Total Bilirubin: 0.5 mg/dL (ref 0.3–1.2)
Total Protein: 7.8 g/dL (ref 6.5–8.1)

## 2016-06-06 LAB — CBC WITH DIFFERENTIAL/PLATELET
Basophils Absolute: 0 10*3/uL (ref 0–0.1)
Basophils Relative: 1 %
Eosinophils Absolute: 0.3 10*3/uL (ref 0–0.7)
Eosinophils Relative: 4 %
HCT: 25.7 % — ABNORMAL LOW (ref 40.0–52.0)
Hemoglobin: 8.3 g/dL — ABNORMAL LOW (ref 13.0–18.0)
Lymphocytes Relative: 9 %
Lymphs Abs: 0.6 10*3/uL — ABNORMAL LOW (ref 1.0–3.6)
MCH: 25.1 pg — ABNORMAL LOW (ref 26.0–34.0)
MCHC: 32.3 g/dL (ref 32.0–36.0)
MCV: 77.8 fL — ABNORMAL LOW (ref 80.0–100.0)
Monocytes Absolute: 0.7 10*3/uL (ref 0.2–1.0)
Monocytes Relative: 10 %
Neutro Abs: 5.7 10*3/uL (ref 1.4–6.5)
Neutrophils Relative %: 78 %
Platelets: 260 10*3/uL (ref 150–440)
RBC: 3.3 MIL/uL — ABNORMAL LOW (ref 4.40–5.90)
RDW: 17.9 % — ABNORMAL HIGH (ref 11.5–14.5)
WBC: 7.4 10*3/uL (ref 3.8–10.6)

## 2016-06-06 LAB — URINALYSIS, COMPLETE (UACMP) WITH MICROSCOPIC
BILIRUBIN URINE: NEGATIVE
GLUCOSE, UA: NEGATIVE mg/dL
KETONES UR: NEGATIVE mg/dL
NITRITE: NEGATIVE
PH: 5 (ref 5.0–8.0)
PROTEIN: 100 mg/dL — AB
Specific Gravity, Urine: 1.015 (ref 1.005–1.030)

## 2016-06-06 LAB — BLADDER SCAN AMB NON-IMAGING

## 2016-06-06 MED ORDER — LEVOFLOXACIN 500 MG PO TABS: 500.0000 mg | ORAL_TABLET | Freq: Every day | ORAL | 0 refills | Status: AC

## 2016-06-06 MED ORDER — SODIUM CHLORIDE 0.9 % IV SOLN
Freq: Once | INTRAVENOUS | Status: AC
  Administered 2016-06-06: 12:00:00 via INTRAVENOUS
  Filled 2016-06-06: qty 1000

## 2016-06-06 MED ORDER — SODIUM CHLORIDE 0.9 % IV SOLN
Freq: Once | INTRAVENOUS | Status: AC
  Administered 2016-06-06: 11:00:00 via INTRAVENOUS
  Filled 2016-06-06: qty 1000

## 2016-06-06 MED ORDER — NIVOLUMAB CHEMO INJECTION 100 MG/10ML
240.0000 mg | Freq: Once | INTRAVENOUS | Status: AC
  Administered 2016-06-06: 240 mg via INTRAVENOUS
  Filled 2016-06-06: qty 20

## 2016-06-06 MED ORDER — HEPARIN SOD (PORK) LOCK FLUSH 100 UNIT/ML IV SOLN: 250.0000 [IU] | Freq: Once | INTRAVENOUS | Status: DC | PRN

## 2016-06-06 NOTE — Progress Notes (Signed)
06/06/2016 4:37 PM   Marc Blake 19-Jul-1955 [id number]  Referring provider: [name], [facility] Blake, [place] [id number]  Chief Complaint  Patient presents with  . Recurrent UTI    dark color, cloudy urine    HPI:   60 year old male with multiple GU issues. He is primarily seen today for fever, foul-smelling urine concerning for infection.  1 - Metastatic Renal Cell Carcinoma - initial DX 2015 in [place] with large left 12cm and right 3cm masses as well as multifocal pulmonary and bone metastasis. Votrient (TKI) initially, then transitioned to Nivolomab (PD-1 inhibitor) 12/2015 for progression. Has had spine radiation and surgery for cord compression.   Recent sumarized Course -  04/2016 - CT stable left and right renal masses, bone mets with progression of pulmonary diease --> nivolomab per cancer center Marc Blake.  Pathologic fracture of femur awaiting surgery at [facility]  2 - Prostatic Hypertrophy - s/p TURP 2005 and 2010 previously in [place].  3 - Urethral Stricture - s/p dilation most recently 2015 in [place]. Recurrence of high grade but short segments bulbar stricture by cysto 01/2016.     Underwent  cystoscopy, balloon urethral dilatation, and bladder biopsy with Foley catheter placement on 03/06/2016. Bladder biopsy revealed cystitis with reactive changes, negative for atypia and malignancy.    Self cathing prn, catheterized twice since Saturday but admits to only doing this ~1 per week  4 - Incomplete Bladder Emptying - very large bladder distension noted on CT 2017 and PVR's 956m range. No recent cysto of bladder funcitonal studies. He has h/o spine surgery / compression (possible neurogenic course) as well and obstruction as per above.    PVR today ~600 cc  Cr fluctuating between 0.91 -1.55 over past 2 months  5- UTI - Complains of cloudy foul-smelling urine on Saturday. Also experienced fever to 102 on the  day. No fever since.  Denies any pain with urination. No sensation of incomplete bladder emptying.  UA obtained earlier today is highly suspicious for infection, urine culture pending. Started on Levaquin by Dr. [name]   PMH: Past Medical History:  Diagnosis Date  . Acid reflux   . Anxiety   . Arthritis   . CHF (congestive heart failure) ([facility]   . Depression   . Depression   . Hyperlipidemia   . Hypertension   . Myocardial infarction   . Renal cancer ([facility]   . Renal cell carcinoma ([facility]   . Skin cancer     Surgical History: Past Surgical History:  Procedure Laterality Date  . BACK SURGERY    . CARDIAC CATHETERIZATION     with stent  . CYSTOSCOPY WITH BIOPSY  03/06/2016   Procedure: CYSTOSCOPY WITH BIOPSY;  Surgeon: Marc Blake;  Location: [facility] ORS;  Service: Urology;;  . CConsuela MimesWITH URETHRAL DILATATION N/A 03/06/2016   Procedure: CYSTOSCOPY WITH URETHRAL DILATATION WITH CATHETER PLACEMENT;  Surgeon: Marc Blake;  Location: [facility] ORS;  Service: Urology;  Laterality: N/A;  . KNEE SURGERY Left   . LEG SURGERY Left   . TONSILLECTOMY      Home Medications:  Allergies as of 06/06/2016   No Known Allergies     Medication List       Accurate as of 06/06/16  4:37 PM. Always use your most recent med list.          amLODipine 2.5 MG tablet Commonly known as:  NORVASC TAKE 1 TABLET ONCE A DAY FOR  30 DAYS   atorvastatin 20 MG tablet Commonly known as:  LIPITOR TAKE 1 TABLET ONCE A DAY (AT BEDTIME) FOR 30 DAYS   busPIRone 5 MG tablet Commonly known as:  BUSPAR TAKE 1 TABLET 3 TIMES A DAY FOR 30 DAYS   carvedilol 12.5 MG tablet Commonly known as:  COREG TAKE 1 TABLET BY MOUTH TWICE A DAY FOR 30 DAYS   gabapentin 300 MG capsule Commonly known as:  NEURONTIN Take 300 mg by mouth 3 (three) times daily.   ibuprofen 200 MG tablet Commonly known as:  ADVIL,MOTRIN Take 200 mg by mouth every 8 (eight) hours as needed for moderate pain. 4 every 8  hours   levofloxacin 500 MG tablet Commonly known as:  LEVAQUIN Take 1 tablet (500 mg total) by mouth daily.   lisinopril 20 MG tablet Commonly known as:  PRINIVIL,ZESTRIL TAKE 1 TABLET ONCE A DAY FOR 30 DAYS   morphine 15 MG 12 hr tablet Commonly known as:  MS CONTIN Take 15 mg by mouth every 12 (twelve) hours.   OPDIVO IV Inject into the vein.   oxyCODONE 5 MG immediate release tablet Commonly known as:  Oxy IR/ROXICODONE Take 5 mg by mouth every 8 (eight) hours as needed (patient takes 2 tabs in the evening only).   senna 8.6 MG tablet Commonly known as:  SENOKOT Take 2 tablets by mouth at bedtime.   sertraline 100 MG tablet Commonly known as:  ZOLOFT Take 100 mg by mouth at bedtime.   traZODone 50 MG tablet Commonly known as:  DESYREL Take 50 mg by mouth at bedtime.   triamcinolone cream 0.5 % Commonly known as:  KENALOG Apply topically 2 (two) times daily.   XARELTO 20 MG Tabs tablet Generic drug:  rivaroxaban Take 20 mg by mouth daily.       Allergies: No Known Allergies  Family History: Family History  Problem Relation Age of Onset  . Hypertension Father   . Heart attack Father   . Stroke Sister   . Hypertension Brother   . Stroke Maternal Uncle     Social History:  reports that he has never smoked. He has never used smokeless tobacco. He reports that he does not drink alcohol or use drugs.    Review of Systems  Gastrointestinal (upper)  : Negative for upper GI symptoms  Gastrointestinal (lower) : Negative for lower GI symptoms  Constitutional : Negative for symptoms  Skin: Negative for skin symptoms  Eyes: Negative for eye symptoms  Ear/Nose/Throat : Negative for Ear/Nose/Throat symptoms  Hematologic/Lymphatic: Negative for Hematologic/Lymphatic symptoms  Cardiovascular : Negative for cardiovascular symptoms  Respiratory : Negative for respiratory symptoms  Endocrine: Negative for endocrine  symptoms  Musculoskeletal: Negative for musculoskeletal symptoms  Neurological: Negative for neurological symptoms  Psychologic: Negative for psychiatric symptoms  Gastrointestinal Nausea?: No Vomiting?: No Indigestion/heartburn?: No Diarrhea?: No Constipation?: No  Physical Exam: BP (!) 141/66   Pulse (!) 109   Temp 99.6 F (37.6 C)   Ht '6\' 3"'$  (1.905 m)   Wt 250 lb (113.4 kg)   BMI 31.25 kg/m   Constitutional:  Alert and oriented, No acute distress. Uses Cane. Appears older than stated age.  HEENT: [place] AT, moist mucus membranes.  Trachea midline, no masses. Cardiovascular: No clubbing, cyanosis, or edema. Respiratory: Normal respiratory effort, no increased work of breathing. GI: Abdomen is soft, nontender, nondistended, no abdominal masses Skin: Significant psoriatic plaques on arm appreciated Neurologic: Grossly intact, no focal deficits, moving all 4 extremities.  Psychiatric: Normal mood and affect.  Laboratory Data: Lab Results  Component Value Date   WBC 7.4 06/06/2016   HGB 8.3 (L) 06/06/2016   HCT 25.7 (L) 06/06/2016   MCV 77.8 (L) 06/06/2016   PLT 260 06/06/2016    Lab Results  Component Value Date   CREATININE 1.55 (H) 06/06/2016   UA Component     Latest Ref Rng & Units 06/06/2016  Color, Urine     YELLOW YELLOW (A)  Appearance     CLEAR TURBID (A)  Specific Gravity, Urine     1.005 - 1.030 1.015  pH     5.0 - 8.0 5.0  Glucose     NEGATIVE mg/dL NEGATIVE  Hgb urine dipstick     NEGATIVE MODERATE (A)  Bilirubin Urine     NEGATIVE NEGATIVE  Ketones, ur     NEGATIVE mg/dL NEGATIVE  Protein     NEGATIVE mg/dL 100 (A)  Nitrite     NEGATIVE NEGATIVE  Leukocytes, UA     NEGATIVE LARGE (A)  RBC / HPF     0 - 5 RBC/hpf TOO NUMEROUS TO COUNT  WBC, UA     0 - 5 WBC/hpf TOO NUMEROUS TO COUNT  Bacteria, UA     NONE SEEN FEW (A)  Squamous Epithelial / LPF     NONE SEEN 6-30 (A)  WBC Clumps      PRESENT  Mucous      PRESENT     Pertinent Imaging: Results for orders placed or performed in visit on 06/06/16  BLADDER SCAN AMB NON-IMAGING  Result Value Ref Range   Scan Result 642m      Assessment & Plan:    1 - Metastatic Renal Cell Carcinoma - he has good understanding in incurable nature of disease. No role for renal surgery as widespread metastatic disease. Agree with second line therapy per cancer center.    2 - Prostatic Hypertrophy - CIC as below.  Given multiple significant comorbidities, would avoid any outlet procedure if possible.  3 - Urethral Stricture - recurent bulbar urethral stricture s/p dilation  4 - Incomplete Bladder Emptying -suspect component of neurogenic bladder based on significantly elevated postvoid residual today without sensation  I have advised the patient to begin self-catheterization at least 3 times a day and advised to keep her voiding diary in the interim, we'll titrate based on catheterized volumes  Additional catheters were ordered today, he will be doing this indefinitely.  5- UTI- Agree with abx per Dr. CChrisandra Carotaas needed based on culture and sensitivity data    REcheck in early January as previously scheduled   Marc Blake  [facility] [place] [phone]

## 2016-06-06 NOTE — Addendum Note (Signed)
Addended by: Randa Evens C on: 06/06/2016 12:41 PM   Modules accepted: Orders

## 2016-06-06 NOTE — Progress Notes (Addendum)
Hematology/Oncology Consult note [facility]  [phone] [phone]  Patient Care Team: [name], MD as PCP - General (Family Medicine) Dereck Leep, MD (Orthopedic Surgery) Corey Skains, MD as Consulting Physician (Cardiology) Erby Pian, MD as Referring Physician (Specialist)   Name of the patient: Marc Blake  [id number]  [birth year]   Date of visit: 06/06/16  Diagnosis- metastatic renal cell carcinoma  Chief complaint/ Reason for visit- routine on treatment follow-up of metastatic renal cell carcinoma on Depo  Heme/Onc history: Patient is a 60 year old male was diagnosed with metastatic RCC in July 2015. He had a large left renal mass with rib and vertebral body involvement as well as multiple pulmonary nodules. CT-guided biopsy of the left eighth rib confirmed clear cell renal cell carcinoma. He was on Votrient 02/13/2014 to 01/21/2016. He had disease progression and was switched to the whole mass on 01/28/2016. Patient is receiving palliative radiation from T7-T9 in November 2015. In January 2016 he presented with progressive paralysis from waist down and underwent left posterolateral practical to me with left chest wall resection with T8 corpectomy and interbody fusion with donor bone on 07/06/2014. Pathologically again revealed metastatic RCC. Postoperatively he developed sepsis secondary to submandibular abscess/dental abscess. He underwent tracheostomy and I&D of the neck abscess along with extraction and PEG tube placement. Zometa has been on hold since then. In August 2016 he also had a pathologic fracture of the left femur status post intramedullary nailing. In November 2016 he presented with opioid overdose and aspiration pneumonia. Patient is a long-standing history of obstructive uropathy and has undergone cystoscopy, balloon urethral dilation and bladder biopsy with Foley catheter placement in September 2017.  He also has left lower extremity DVT diagnosed in December 2016 for which he is been on Xarelto  Interval history- overall he is doing well and reports no fevers, chills. He does have mild fatigue and also reports dark cloudy urine. Denies any dysuria or frequency. He will be seeing the urologist today.  ECOG PS- 1  Pain scale- 0 Opioid associated constipation- no  Review of systems- Review of Systems  Constitutional: Negative for chills, fever, malaise/fatigue and weight loss.  HENT: Negative for congestion, ear discharge and nosebleeds.   Eyes: Negative for blurred vision.  Respiratory: Negative for cough, hemoptysis, sputum production, shortness of breath and wheezing.   Cardiovascular: Negative for chest pain, palpitations, orthopnea and claudication.  Gastrointestinal: Negative for abdominal pain, blood in stool, constipation, diarrhea, heartburn, melena, nausea and vomiting.  Genitourinary: Negative for dysuria, flank pain, frequency, hematuria and urgency.       Dark cloudy urine  Musculoskeletal: Negative for back pain, joint pain and myalgias.  Skin: Negative for rash.  Neurological: Negative for dizziness, tingling, focal weakness, seizures, weakness and headaches.  Endo/Heme/Allergies: Does not bruise/bleed easily.  Psychiatric/Behavioral: Negative for depression and suicidal ideas. The patient does not have insomnia.      Current treatment- status post cycle # 7 of opdivo  No Known Allergies   Past Medical History:  Diagnosis Date   Acid reflux    Anxiety    Arthritis    CHF (congestive heart failure) (HCC)    Depression    Depression    Hyperlipidemia    Hypertension    Myocardial infarction    Renal cancer (HCC)    Renal cell carcinoma (HCC)    Skin cancer      Past Surgical History:  Procedure Laterality Date   BACK  SURGERY     CARDIAC CATHETERIZATION     with stent   CYSTOSCOPY WITH BIOPSY  03/06/2016   Procedure: CYSTOSCOPY WITH  BIOPSY;  Surgeon: [name], MD;  Location: [facility] ORS;  Service: Urology;;   CYSTOSCOPY WITH URETHRAL DILATATION N/A 03/06/2016   Procedure: CYSTOSCOPY WITH URETHRAL DILATATION WITH CATHETER PLACEMENT;  Surgeon: [name], MD;  Location: [facility] ORS;  Service: Urology;  Laterality: N/A;   KNEE SURGERY Left    LEG SURGERY Left    TONSILLECTOMY      Social History   Social History   Marital status: Married    Spouse name: N/A   Number of children: N/A   Years of education: N/A   Occupational History   Not on file.   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   Drug use: No   Sexual activity: Not on file   Other Topics Concern   Not on file   Social History Narrative   No narrative on file    Family History  Problem Relation Age of Onset   Hypertension Father    Heart attack Father    Stroke Sister    Hypertension Brother    Stroke Maternal Uncle      Current Outpatient Prescriptions:    amLODipine (NORVASC) 2.5 MG tablet, TAKE 1 TABLET ONCE A DAY FOR 30 DAYS, Disp: , Rfl: 2   atorvastatin (LIPITOR) 20 MG tablet, TAKE 1 TABLET ONCE A DAY (AT BEDTIME) FOR 30 DAYS, Disp: , Rfl: 2   busPIRone (BUSPAR) 5 MG tablet, TAKE 1 TABLET 3 TIMES A DAY FOR 30 DAYS, Disp: , Rfl: 2   carvedilol (COREG) 12.5 MG tablet, TAKE 1 TABLET BY MOUTH TWICE A DAY FOR 30 DAYS, Disp: , Rfl: 3   gabapentin (NEURONTIN) 300 MG capsule, Take 300 mg by mouth 3 (three) times daily. , Disp: , Rfl:    ibuprofen (ADVIL,MOTRIN) 200 MG tablet, Take 200 mg by mouth every 8 (eight) hours as needed for moderate pain. 4 every 8 hours, Disp: , Rfl:    lisinopril (PRINIVIL,ZESTRIL) 20 MG tablet, TAKE 1 TABLET ONCE A DAY FOR 30 DAYS, Disp: , Rfl: 3   morphine (MS CONTIN) 15 MG 12 hr tablet, Take 15 mg by mouth every 12 (twelve) hours., Disp: , Rfl: 0   Nivolumab (OPDIVO IV),  Inject into the vein., Disp: , Rfl:    oxyCODONE (OXY IR/ROXICODONE) 5 MG immediate release tablet, Take 5 mg by mouth every 8 (eight) hours as needed (patient takes 2 tabs in the evening only). , Disp: , Rfl:    senna (SENOKOT) 8.6 MG tablet, Take 2 tablets by mouth at bedtime. , Disp: , Rfl:    sertraline (ZOLOFT) 100 MG tablet, Take 100 mg by mouth at bedtime., Disp: , Rfl: 1   traZODone (DESYREL) 50 MG tablet, Take 50 mg by mouth at bedtime. , Disp: , Rfl: 3   triamcinolone cream (KENALOG) 0.5 %, Apply topically 2 (two) times daily., Disp: 30 g, Rfl: 1   XARELTO 20 MG TABS tablet, Take 20 mg by mouth daily., Disp: , Rfl: 1  Physical exam:  Vitals:   06/06/16 0959  BP: 114/70  Pulse: 75  Temp: 97.9 F (36.6 C)  TempSrc: Tympanic  Weight: 248 lb 0.3 oz (112.5 kg)  Height: '6\' 3"'$  (1.905 m)   Physical Exam  Constitutional: He is oriented to person, place, and time and well-developed, well-nourished, and in no distress.  HENT:  Head: Normocephalic and atraumatic.  Eyes: EOM are normal. Pupils are equal, round, and reactive to light.  Neck: Normal range of motion.  Cardiovascular: Normal rate, regular rhythm and normal heart sounds.   Pulmonary/Chest: Effort normal and breath sounds normal.  Abdominal: Soft. Bowel sounds are normal.  Neurological: He is alert and oriented to person, place, and time.  Skin: Skin is warm and dry.     CMP Latest Ref Rng & Units 06/02/2016  Glucose 65 - 99 mg/dL 129(H)  BUN 6 - 20 mg/dL 27(H)  Creatinine 0.61 - 1.24 mg/dL 1.24  Sodium 135 - 145 mmol/L 137  Potassium 3.5 - 5.1 mmol/L 3.9  Chloride 101 - 111 mmol/L 101  CO2 22 - 32 mmol/L 29  Calcium 8.9 - 10.3 mg/dL 9.0  Total Protein 6.5 - 8.1 g/dL 7.6  Total Bilirubin 0.3 - 1.2 mg/dL 0.5  Alkaline Phos 38 - 126 U/L 68  AST 15 - 41 U/L 12(L)  ALT 17 - 63 U/L 9(L)   CBC Latest Ref Rng & Units 06/06/2016  WBC 3.8 - 10.6 K/uL 7.4  Hemoglobin 13.0 - 18.0 g/dL 8.3(L)  Hematocrit 40.0 -  52.0 % 25.7(L)  Platelets 150 - 440 K/uL 260     No results found.   Assessment and plan- Patient is a 59 y.o. male with a history of metastatic renal cell carcinoma with metastases to the bone and lung who is currently on palliative immunotherapy opdivo  1. Counts are okay to proceed with cycle number 8 of opdivo today. He is a little more anemic as compared to his prior blood work and has microcytic anemia. We will therefore add iron studies to his blood work today.  2. Continue Xarelto for DVT  but assess for bleeding risk with the next visit given ongoing microcytic anemia   3. We did check a UA today which shows numerous WBCs and I will prescribe him a course of Levaquin for presumptive UTI. Urine cultures are pending. And he will be following up with urology later today   Visit Diagnosis 1. Renal cell carcinoma of left kidney (HCC)   2. Acute cystitis without hematuria      Addendum: The patient was noted to have an elevated creatinine of 1.55 which could be possibly secondary to UTI. 500 mL normal saline boluses given today in the infusion along with opdivo Dr. Randa Evens, MD, MPH Accident at [facility] Pager-  06/06/2016 8:54 AM

## 2016-06-06 NOTE — Progress Notes (Signed)
Pt feels like he is drinking good, his urine is still cloudy. He is not dizzy standing up

## 2016-06-07 LAB — URINE CULTURE: Culture: >=100000 — AB

## 2016-06-15 ENCOUNTER — Telehealth: Payer: Self-pay | Admitting: *Deleted

## 2016-06-15 NOTE — Telephone Encounter (Signed)
Pt states that he is hurting and stiff every am for about 5 weeks and feel like he is locked up. It involves his right shoulder, arm, hand, fingers and after 4 liq. advil and 2 oxycodone and morphine ER and about 2 hours later to be able to move. He says that it is worse when he gets opdivo.  I told him I would check with corcoran and let him know.  I did check with her and she says she has never seen it but when I looked it up on Up to date it does show that pt can have musculoskelatal pain and arthalgias.  She said that she would give steroids but he is having surgery tom. And should not get steroids before surgery but after he has recovered from surgery and comes back to be seen then we can decide what to do for him. I have cancelled the 06/20/16 appt for him because he will be in hospital per pt and we will recheck on him 06/26/2016 and he is agreeable to above

## 2016-06-16 DIAGNOSIS — Z923 Personal history of irradiation: Secondary | ICD-10-CM | POA: Insufficient documentation

## 2016-06-20 ENCOUNTER — Telehealth: Payer: Self-pay | Admitting: Urology

## 2016-06-20 ENCOUNTER — Inpatient Hospital Stay: Payer: 59

## 2016-06-20 ENCOUNTER — Inpatient Hospital Stay: Payer: 59 | Admitting: Hematology and Oncology

## 2016-06-20 NOTE — Telephone Encounter (Signed)
pt is in the hospital at [facility] he had leg surgery he cx his follow up appt but will call back to reschd later  [facility]

## 2016-06-21 ENCOUNTER — Ambulatory Visit: Payer: 59 | Admitting: Urology

## 2016-06-23 ENCOUNTER — Ambulatory Visit: Payer: 59 | Admitting: Urology

## 2016-06-26 ENCOUNTER — Telehealth: Payer: Self-pay | Admitting: *Deleted

## 2016-06-26 NOTE — Telephone Encounter (Signed)
Called and left message with pt. Asked if pt has recuperated from his surgery at [facility] that was done in late dec.  If he is ready to start back on his treatment. Asked him to call me. Left my [place] number for today and he has the number for [place] office.

## 2016-07-11 ENCOUNTER — Telehealth: Payer: Self-pay | Admitting: *Deleted

## 2016-07-11 NOTE — Telephone Encounter (Signed)
Patient returned [name]'s call in [place] [facility] today. Wanted to leave a msg for [name] team. Stated that Marc Spice, RN called him yesterday and left a msg. He is returning her call. States that to let [name] know that he is still in rehab after his surgery and wanted to [name] to know that he is doing well. He does not request a return phone call. I told him that I would notify the clinical team.

## 2016-07-11 NOTE — Telephone Encounter (Signed)
Called pt to let him know that we passed on the information to [name] and when he should start back on opdivo. Per Dr Mike Gip she would like to talk to the doctor that did the surgery and get clearance to start back with opdivo.  Once the 2 doctors speak then the [place] team will let pt know

## 2016-07-14 ENCOUNTER — Telehealth: Payer: Self-pay | Admitting: *Deleted

## 2016-07-14 NOTE — Telephone Encounter (Signed)
Called patient and left message for him to call back to get restarted on chemo as [name] had spoken with surgeon at [facility] and we were cleared to restart.

## 2016-07-17 NOTE — Telephone Encounter (Signed)
Patient reports that he will be out of rehab in 2 weeks and he could get back in for treatment then.

## 2016-07-17 NOTE — Telephone Encounter (Signed)
Marc Blake is making him an appointment to see you in 2 weeks

## 2016-07-30 NOTE — Progress Notes (Signed)
[facility] day:  07/31/16   Chief Complaint: Marc Blake is a 61 y.o. male with metastatic renal cell carcinoma who is seen for reassessment after interval surgery.  HPI: The patient was last seen by me in the medical oncology clinic on 06/02/2016.  At that time, he had persistent unexplained hypotension.  He was back on his Coreg.  I spoke with his cardiologist, Dr. Nehemiah Massed.  Coreg was held.  He received IVF.  He saw Dr. Janese Banks in my absence on 06/06/2016.  At that time, he noted fatigue and dark cloudy urine.  Blood pressure was 114/70.  Hematocrit was 25.7 with a hemoglobin of 8.3.  Creatinine was 1.55.  He received cycle #8 Opdivo.  He was prescribed a course of Levaquin for a presumed UTI.  Urin culture grew > 100,000 colonies of streptococcus pyogenes.  He was admitted to [facility] from 06/16/2016 - 01/08/208.  He underwent removal of prior rod/intramedullary nail, radical resection of left femur, resection of tumor, and hemiarthroplasty by Dr. Jackqulyn Livings on 06/16/2016.  Pathology of the left proximal femur revealed metastatic carcinoma compatible with renal cell carcinoma.  There were changes c/w fracture.  Estimated blood loss was 350 cc.  He received 2 units of PRBCs secondary to anemia.    B[facility] scan at [facility] on 06/22/2016 revealed no definite evidence of osseous metastatic disease.  There were photopenic regions overlying the left posterior eighth rib and the proximal left femur, likely representing prior surgical resection.  He was discharged to rehabiliation.  Pain medications were adjusted.  He was seen by Dr. Nydia Bouton for follow-up on 07/27/2016.  At that time, he was recovering well from surgery.  Sutures were removed in clinic. X-rays look good. Abduction brace was discontinued.  He was instructed to started slowly advancing weight bearing (walker at first and then advance to cane).  He is to begin outpatient physical therapy through the  [facility].  He was cleared to restart treatment.  He has had good days and bad days.  He is eating fine, but drinking poorly.  He feels dizzy and lightheaded today.  He has pain in his shoulders, elbows, and wrists unrelated to treatment (off therapy x 2 months).  He has trouble opening bottles.    His psoriasis has cleared up off treatment.   Past Medical History:  Diagnosis Date  . Acid reflux   . Anxiety   . Arthritis   . CHF (congestive heart failure) ([place])   . Depression   . Depression   . Hyperlipidemia   . Hypertension   . Myocardial infarction   . Renal cancer ([place])   . Renal cell carcinoma ([place])   . Skin cancer     Past Surgical History:  Procedure Laterality Date  . BACK SURGERY    . CARDIAC CATHETERIZATION     with stent  . CYSTOSCOPY WITH BIOPSY  03/06/2016   Procedure: CYSTOSCOPY WITH BIOPSY;  Surgeon: [name], MD;  Location: [facility] ORS;  Service: Urology;;  . [name] WITH URETHRAL DILATATION N/A 03/06/2016   Procedure: CYSTOSCOPY WITH URETHRAL DILATATION WITH CATHETER PLACEMENT;  Surgeon: [name], MD;  Location: [facility] ORS;  Service: Urology;  Laterality: N/A;  . KNEE SURGERY Left   . LEG SURGERY Left   . TONSILLECTOMY      Family History  Problem Relation Age of Onset  . Hypertension Father   . Heart attack Father   . Stroke Sister   .  Hypertension Brother   . Stroke Maternal Uncle     Social History:  reports that he has never smoked. He has never used smokeless tobacco. He reports that he does not drink alcohol or use drugs.  He is originally from [place].  He then lived in [place] for 17 years.  He moved to [place] with his wife.  The patient is alone today.  Allergies: No Known Allergies  Current Medications: Current Outpatient Prescriptions  Medication Sig Dispense Refill  . atorvastatin (LIPITOR) 20 MG tablet TAKE 1 TABLET ONCE A DAY (AT BEDTIME) FOR 30 DAYS  2  . busPIRone (BUSPAR) 7.5 MG tablet     . carvedilol  (COREG) 12.5 MG tablet TAKE 1 TABLET BY MOUTH TWICE A DAY FOR 30 DAYS  3  . [employer] GENTLE LAXATIVE 10 MG suppository INSERT 1 SUPPOSITORY RECTALLY EVERY 24HRS AS NEEDED FOR CONSTIPATION  0  . [employer] STOOL SOFTENER 8.6-50 MG tablet TAKE 1 TABLET BY MOUTH TWICE A DAY AS NEEDED CONSTIPATION  0  . fluocinonide cream (LIDEX) 0.05 %     . gabapentin (NEURONTIN) 300 MG capsule Take 300 mg by mouth 3 (three) times daily.     Marland Kitchen ibuprofen (ADVIL,MOTRIN) 200 MG tablet Take 200 mg by mouth every 8 (eight) hours as needed for moderate pain. 4 every 8 hours    . ipratropium-albuterol (DUONEB) 0.5-2.5 (3) MG/3ML SOLN     . lisinopril (PRINIVIL,ZESTRIL) 10 MG tablet     . morphine (MS CONTIN) 15 MG 12 hr tablet Take 30 mg by mouth every 12 (twelve) hours.   0  . morphine (MS CONTIN) 30 MG 12 hr tablet     . oxyCODONE (OXY IR/ROXICODONE) 5 MG immediate release tablet Take 10 mg by mouth every 6 (six) hours as needed (every 6 h prn).     . senna (SENOKOT) 8.6 MG tablet Take 2 tablets by mouth at bedtime.     . sertraline (ZOLOFT) 100 MG tablet Take 100 mg by mouth at bedtime.  1  . sertraline (ZOLOFT) 100 MG tablet Take by mouth.    . traZODone (DESYREL) 50 MG tablet Take 50 mg by mouth at bedtime.   3  . triamcinolone cream (KENALOG) 0.5 % Apply topically 2 (two) times daily. 30 g 1  . XARELTO 20 MG TABS tablet Take 20 mg by mouth daily.  1  . amLODipine (NORVASC) 10 MG tablet Take 10 mg by mouth daily.  3  . Nivolumab (OPDIVO IV) Inject into the vein.     No current facility-administered medications for this visit.     Review of Systems:  GENERAL:  Feels lightheaded.  No fevers or sweats.  Weight down 1 pound. PERFORMANCE STATUS (ECOG):  1 HEENT:  No visual changes, runny nose, sore throat, mouth sores or tenderness.  Waiting until after 06/19/2016 about decision to fix teeth. Lungs:  No shortness of breath, cough or wheezing.  No hemoptysis. Cardiac:  No chest pain, palpitations, orthopnea, or PND. GI:  No  nausea, vomiting, diarrhea, constipation, melena or hematochezia. GU:  Voiding well.  No urgency, frequency, dysuria, or hematuria. Musculoskeletal:  Femur issues (surgery at [facility] on 06/16/2016).  Joint pain on left side (see HPI).  No muscle tenderness. Extremities:  Chronic left lower extremity swelling. Skin:  Eczema on elbows and back. Neuro:  "Memory sucks".  No headache, numbness or weakness.  Decreased feeling left leg (chronic).  Left leg feels better than right leg. Endocrine:  No diabetes, thyroid  issues, hot flashes or night sweats. Psych:  No mood changes, depression or anxiety. Pain:  No focal pain. Review of systems:  All other systems reviewed and found to be negative.  Physical Exam: Blood pressure (!) 88/47, pulse 73, temperature 97.4 F (36.3 C), temperature source Tympanic, resp. rate 20, weight 241 lb 4.7 oz (109.5 kg). GENERAL:  Well developed, well nourished, slightly fatigued appearing gentleman sitting comfortably in the exam room in no acute distress.  He has a cane at his side. MENTAL STATUS:  Alert and oriented to person, place and time. HEAD:  Long gray hair with goatee.  Normocephalic, atraumatic, face symmetric, no Cushingoid features. EYES:  Pupils equal round and reactive to light and accomodation.  No conjunctivitis or scleral icterus. ENT:  Oropharynx clear without lesion.  Poor dentition.  Tongue normal. Mucous membranes moist.  RESPIRATORY:  Clear to auscultation without rales, wheezes or rhonchi. CARDIOVASCULAR:  Regular rate and rhythm without murmur, rub or gallop.  No JVD. ABDOMEN:  Soft, non-tender, with active bowel sounds, and no hepatosplenomegaly.  No masses. SKIN:  Tattoos. Eczema on dorsal surface of forearms and lower back, markedly improved. EXTREMITIES:  Right wrist splint.  No skin discoloration or tenderness.  No palpable cords. LYMPH NODES: No palpable cervical, supraclavicular, axillary or inguinal adenopathy  NEUROLOGICAL:  Stable.  No  change in ambulation. PSYCH:  Appropriate.  Marland Kitchen  No visits with results within 3 Day(s) from this visit.  Latest known visit with results is:  Office Visit on 06/06/2016  Component Date Value Ref Range Status  . Scan Result 06/06/2016 634m   Final    Assessment:  RCASHEL BELLINAis a 61y.o. male with metastatic renal cell carcinoma presenting in 12/2013.  He had a large left renal mass, rib and vertebral body involvement, and multiple pulmonary nodules.  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    He was on Votrient (pazopanib) from 02/13/2014 - 01/21/2016.  He began nivolumab on 01/28/2016.  Abdominal and pelvic CT scan on 01/12/2014 revealed an 8.5 x 10.2 x 9.2 cm irregularly enhancing mass of the left kidney with some internal calcifications. There were enlarged left sided renal hilar lymph nodes. There were multiple pulmonary nodules in both lower lobes (largest 1.1 cm). There was a destructive bone lesion with an irregular 8.6 x 5.1 cm soft tissue mass invasive of the spinal canal narrowing at least 50% and disturbing the pedicle and portions of the left transverse process rib and T8 vertebral body.   Chest CT on 01/15/2014 revealed innumerable pulmonary nodules, a large metastatic lesion at T8 and left eighth rib with invasion into the spinal canal and moderate canal stenosis. There was a left upper pole right kidney mass.  Head MRI was negative.  He received 3000 cGy to T7 - T9 and associated ribs beginning 05/11/2014.  On 06/15/2014, he received 1 dose of nivolumab.  He presented with progressive paralysis from the waist down on 07/06/2014.  He underwent left posterolateral thoracotomy with left chest wall resection with T8 corpectomy and interbody fusion with donor bone on 07/06/2014.  Pathology revealed metastatic renal cell carcinoma. Post-operatively, he developed sepsis secondary to a submandibular abscess dental abscess.  He  underwent tracheostomy, I&D of a right neck abscess, tooth extraction, and PEG tube placement.  Zometa has subsequently been on hold.  He developed a pathologic fracture of the left femur.  He is s/p intramedullary nailing on 01/27/2015.  He received radiation to the left leg.  He was admitted on 04/26/2015 with aspiration pneumonia and opioid overdose. He is currently taking MSContin 15 mg BID and oxycodone 5 mg q day.  Chest, abdomen, and pelvic CT scan on 05/31/2015 revealed slight enlargement of multiple pulmonary nodules.  The left renal mass was similar (12.2 x 9.6 cm).  The right kidney lesion was stable (lower pole lesion 2 x 1.7 cm).  Bone scan on 05/31/2015 revealed slight increased activity in the left costal vertebral junction at T7.  Chest, abdomen, and pelvic CT scan on 12/14/2015 revealed an irregular 10.7 cm heterogeneous renal cortical mass in the upper left kidney and a heterogeneous 2.3 cm renal cortical mass in the lateral lower right kidney.  There was mild bilateral hydroureteronephrosis with bladder distention.  There was mild left para-aortic lymphadenopathy.  There were numerous (greater than 20) pulmonary metastases.  Largest nodule was 2.1 cm in the RLL. There was a partially visualized mildly expansile lytic lesion in the left proximal femur status post surgical transfixation.  Thre was a small probably loculated pleural effusion at the left eighth rib resection site.  There were no other bone abnormalities.  Bone scan on 12/23/2015 revealed photopenia from prior removal of the left eighth rib. There was increased  uptake in the medial thoracic spine from T7 and T9 is felt to be due to postoperative fusion.  There was increased uptake throughout much of the left femur as well as increased uptake in the soft tissues of much of the left femur which may be due to previous surgery and radiation therapy change.  He developed a left lower extremity DVT (popliteal and soleal vein) on  06/07/2015.  Left lower extremity ultrasound on 02/07/2016 revealed no evidence of DVT.  He is on long term Xarelto.  Patient has a long standing history of an obstructive uropathy and elevated PVR.  He has undergone urethral dilatation on several occasions.  He underwent cystoscopy, balloon urethral dilatation, and bladder biopsy with Foley catheter placement on 03/06/2016.  Bladder biopsy revealed cystitis with reactive changes, negative for atypia and malignancy.  He is s/p 8 cycles of nivolumab (01/28/2016 - 06/06/2016).  He tolerated his infusions well.  TSH was 6.239 (0.35-4.50) with a normal free T4 (0.79) on 02/25/2016.  TSH was 2.713 (normal) and free T4 0.79 (normal) on 05/19/2016.  Treatment was held on 05/19/2016 secondary to diarrhea and on 05/26/2016 secondary to unexplained hypotension.   Cortisol level was 7.9 with a repeat of 10.3 (normal) on 05/29/2016 at 8:26 AM.  ACTH was 39.1 (normal) at 10 AM on 05/26/2016.  He was admitted to [facility] from 02/14/2016 - 02/16/2016 with chest pain.  Chest CT angiogram on 02/15/2016 ruled out pulmonary embolism. Imaging revealed slight progression of mediastinal left hilar adenopathy was pulmonary metastatic disease. Stress test on 02/15/2016 ruled out cardiac ischemia.  He was treated with Augmentin for presumed lower extremity cellulitis.  Chest CT on 03/10/2016 revealed no evidence of pneumonia or pneumonitis.  There was left mild atelectasis small effusion similar to prior.  There were bilateral multiple metastasis (stable).   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  There is possible increase in change in the lytic lesion with some movement at the fracture site.  He has broken the distal locking screw.  He ambulates with a rolling walker.  B[facility] scan at [facility] on 06/22/2016 revealed no definite evidence of osseous metastatic disease.  There were photopenic regions overlying  the left posterior eighth rib and the proximal left femur, likely  representing prior surgical resection  He underwent removal of prior rod/intramedullary nail, radical resection of left femur, resection of tumor, and left hemiarthroplasty at [facility] on 06/16/2016.  Pathology of the left proximal femur revealed metastatic carcinoma compatible with renal cell carcinoma.  He is scheduled to begin physical therapy.   He has a normocytic anemia.  After his surgery at [facility] in 05/2016, he required 2 units of PRBCs.  He has restrictive lung disease secondary to his weight and prior radiation.  He has been prescribed oxygen (not received).    His psoriasis flared on nivolumab, but has improved with topical steroids.  Psoriasis is back to baseline after 2 months off nivolumab.  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  Symptomatically, he is fatigued.  Fluid intake has been poor.  He has diffuse joint pain unrelated to nivolumab.  He has orthostatic hypotension.  Plan: 1.  Labs today:  CBC with diff, CMP, ferritin, iron studies, retic, sed rate. 2.  Discuss results from surgery.  Discuss plans for reinitiation of nivolumab.  Discuss new baseline CT scans prior to reinitiation of therapy. 3.  Discuss referral to rheumatology to assess and treat arthritis unrelated to nivolumab. 4.  Hold blood pressure medications.  IVF today- 500 cc with repeat orthostatics and additional fluid if needed. 5.  Phone follow-up with [name] re: blood pressure-done.  Patient to be seen by [name] for follow-up. 6.  Chest, abdomen, and pelvic CT scan this week. 7.  Consult Dr. Precious Reel (rheumatology) re: severe joint pain. 8.  RTC in 1 week for MD assess, labs (CBC with diff, CMP, Mg), and reinitiation of nivolumab.   [name], MD  07/31/2016 , 10:07 AM

## 2016-07-31 ENCOUNTER — Inpatient Hospital Stay: Payer: 59

## 2016-07-31 ENCOUNTER — Inpatient Hospital Stay: Payer: 59 | Attending: Hematology and Oncology | Admitting: Hematology and Oncology

## 2016-07-31 VITALS — BP 99/61 | HR 67

## 2016-07-31 VITALS — BP 88/47 | HR 73 | Temp 97.4°F | Resp 20 | Wt 241.3 lb

## 2016-07-31 DIAGNOSIS — I509 Heart failure, unspecified: Secondary | ICD-10-CM | POA: Diagnosis not present

## 2016-07-31 DIAGNOSIS — C642 Malignant neoplasm of left kidney, except renal pelvis: Secondary | ICD-10-CM

## 2016-07-31 DIAGNOSIS — R42 Dizziness and giddiness: Secondary | ICD-10-CM | POA: Diagnosis not present

## 2016-07-31 DIAGNOSIS — Z85828 Personal history of other malignant neoplasm of skin: Secondary | ICD-10-CM | POA: Insufficient documentation

## 2016-07-31 DIAGNOSIS — C7951 Secondary malignant neoplasm of bone: Secondary | ICD-10-CM

## 2016-07-31 DIAGNOSIS — C342 Malignant neoplasm of middle lobe, bronchus or lung: Secondary | ICD-10-CM

## 2016-07-31 DIAGNOSIS — Z86718 Personal history of other venous thrombosis and embolism: Secondary | ICD-10-CM | POA: Diagnosis not present

## 2016-07-31 DIAGNOSIS — Z79899 Other long term (current) drug therapy: Secondary | ICD-10-CM | POA: Diagnosis not present

## 2016-07-31 DIAGNOSIS — Z5112 Encounter for antineoplastic immunotherapy: Secondary | ICD-10-CM | POA: Insufficient documentation

## 2016-07-31 DIAGNOSIS — Z7901 Long term (current) use of anticoagulants: Secondary | ICD-10-CM | POA: Insufficient documentation

## 2016-07-31 DIAGNOSIS — K219 Gastro-esophageal reflux disease without esophagitis: Secondary | ICD-10-CM | POA: Diagnosis not present

## 2016-07-31 DIAGNOSIS — F419 Anxiety disorder, unspecified: Secondary | ICD-10-CM | POA: Insufficient documentation

## 2016-07-31 DIAGNOSIS — I252 Old myocardial infarction: Secondary | ICD-10-CM | POA: Insufficient documentation

## 2016-07-31 DIAGNOSIS — E785 Hyperlipidemia, unspecified: Secondary | ICD-10-CM | POA: Insufficient documentation

## 2016-07-31 DIAGNOSIS — I951 Orthostatic hypotension: Secondary | ICD-10-CM

## 2016-07-31 DIAGNOSIS — M129 Arthropathy, unspecified: Secondary | ICD-10-CM | POA: Insufficient documentation

## 2016-07-31 DIAGNOSIS — I10 Essential (primary) hypertension: Secondary | ICD-10-CM | POA: Diagnosis not present

## 2016-07-31 DIAGNOSIS — C7800 Secondary malignant neoplasm of unspecified lung: Secondary | ICD-10-CM | POA: Diagnosis not present

## 2016-07-31 DIAGNOSIS — R5383 Other fatigue: Secondary | ICD-10-CM | POA: Insufficient documentation

## 2016-07-31 DIAGNOSIS — F329 Major depressive disorder, single episode, unspecified: Secondary | ICD-10-CM | POA: Diagnosis not present

## 2016-07-31 DIAGNOSIS — N3289 Other specified disorders of bladder: Secondary | ICD-10-CM | POA: Insufficient documentation

## 2016-07-31 DIAGNOSIS — Z923 Personal history of irradiation: Secondary | ICD-10-CM | POA: Diagnosis not present

## 2016-07-31 DIAGNOSIS — D649 Anemia, unspecified: Secondary | ICD-10-CM | POA: Diagnosis not present

## 2016-07-31 DIAGNOSIS — M2550 Pain in unspecified joint: Secondary | ICD-10-CM | POA: Diagnosis not present

## 2016-07-31 DIAGNOSIS — M1990 Unspecified osteoarthritis, unspecified site: Secondary | ICD-10-CM

## 2016-07-31 LAB — CBC WITH DIFFERENTIAL/PLATELET
Basophils Absolute: 0.1 10*3/uL (ref 0–0.1)
Basophils Relative: 1 %
Eosinophils Absolute: 0.3 10*3/uL (ref 0–0.7)
Eosinophils Relative: 3 %
HCT: 24 % — ABNORMAL LOW (ref 40.0–52.0)
Hemoglobin: 7.5 g/dL — ABNORMAL LOW (ref 13.0–18.0)
Lymphocytes Relative: 7 %
Lymphs Abs: 0.6 10*3/uL — ABNORMAL LOW (ref 1.0–3.6)
MCH: 23.2 pg — ABNORMAL LOW (ref 26.0–34.0)
MCHC: 31.1 g/dL — ABNORMAL LOW (ref 32.0–36.0)
MCV: 74.6 fL — ABNORMAL LOW (ref 80.0–100.0)
Monocytes Absolute: 1.1 10*3/uL — ABNORMAL HIGH (ref 0.2–1.0)
Monocytes Relative: 13 %
Neutro Abs: 6.9 10*3/uL — ABNORMAL HIGH (ref 1.4–6.5)
Neutrophils Relative %: 76 %
Platelets: 354 10*3/uL (ref 150–440)
RBC: 3.21 MIL/uL — ABNORMAL LOW (ref 4.40–5.90)
RDW: 19.8 % — ABNORMAL HIGH (ref 11.5–14.5)
WBC: 9 10*3/uL (ref 3.8–10.6)

## 2016-07-31 LAB — RETICULOCYTES
RBC.: 3.22 MIL/uL — ABNORMAL LOW (ref 4.40–5.90)
Retic Count, Absolute: 80.5 10*3/uL (ref 19.0–183.0)
Retic Ct Pct: 2.5 % (ref 0.4–3.1)

## 2016-07-31 LAB — IRON AND TIBC
Iron: 27 ug/dL — ABNORMAL LOW (ref 45–182)
Saturation Ratios: 12 % — ABNORMAL LOW (ref 17.9–39.5)
TIBC: 217 ug/dL — ABNORMAL LOW (ref 250–450)
UIBC: 190 ug/dL

## 2016-07-31 LAB — COMPREHENSIVE METABOLIC PANEL
ALT: 9 U/L — ABNORMAL LOW (ref 17–63)
AST: 13 U/L — ABNORMAL LOW (ref 15–41)
Albumin: 3.1 g/dL — ABNORMAL LOW (ref 3.5–5.0)
Alkaline Phosphatase: 64 U/L (ref 38–126)
Anion gap: 6 (ref 5–15)
BUN: 32 mg/dL — ABNORMAL HIGH (ref 6–20)
CO2: 30 mmol/L (ref 22–32)
Calcium: 9 mg/dL (ref 8.9–10.3)
Chloride: 100 mmol/L — ABNORMAL LOW (ref 101–111)
Creatinine, Ser: 1.54 mg/dL — ABNORMAL HIGH (ref 0.61–1.24)
GFR calc Af Amer: 55 mL/min — ABNORMAL LOW (ref >60)
GFR calc non Af Amer: 47 mL/min — ABNORMAL LOW (ref >60)
Glucose, Bld: 108 mg/dL — ABNORMAL HIGH (ref 65–99)
Potassium: 4.7 mmol/L (ref 3.5–5.1)
Sodium: 136 mmol/L (ref 135–145)
Total Bilirubin: 0.6 mg/dL (ref 0.3–1.2)
Total Protein: 8 g/dL (ref 6.5–8.1)

## 2016-07-31 LAB — FERRITIN: Ferritin: 318 ng/mL (ref 24–336)

## 2016-07-31 LAB — SEDIMENTATION RATE: Sed Rate: >140 mm/hr — ABNORMAL HIGH (ref 0–20)

## 2016-07-31 MED ORDER — SODIUM CHLORIDE 0.9 % IV SOLN
Freq: Once | INTRAVENOUS | Status: AC
  Filled 2016-07-31: qty 1000

## 2016-07-31 MED ORDER — SODIUM CHLORIDE 0.9 % IV SOLN
Freq: Once | INTRAVENOUS | Status: AC
  Administered 2016-07-31: 14:00:00 via INTRAVENOUS
  Filled 2016-07-31: qty 1000

## 2016-07-31 MED ORDER — SODIUM CHLORIDE 0.9 % IV SOLN
Freq: Once | INTRAVENOUS | Status: AC
  Administered 2016-07-31: 11:00:00 via INTRAVENOUS
  Filled 2016-07-31: qty 1000

## 2016-07-31 NOTE — Progress Notes (Signed)
Here for follow up. Left leg surgery Dec 29/17-prosthetic femur/ hip repalcment.  In rehab x 1 month. D/c one week ago. BP today sitting 88/47,p73. Standing bp 73/42,p 74. Encouraged to drink more water. Pt stated felt a little dizzy on standing. Dr Mike Gip informed.

## 2016-08-01 ENCOUNTER — Encounter: Payer: Self-pay | Admitting: Hematology and Oncology

## 2016-08-01 DIAGNOSIS — D649 Anemia, unspecified: Secondary | ICD-10-CM | POA: Insufficient documentation

## 2016-08-01 DIAGNOSIS — M1990 Unspecified osteoarthritis, unspecified site: Secondary | ICD-10-CM | POA: Insufficient documentation

## 2016-08-02 ENCOUNTER — Ambulatory Visit
Admission: RE | Admit: 2016-08-02 | Discharge: 2016-08-02 | Disposition: A | Discharge status: Home / Self Care | Payer: 59 | Source: Ambulatory Visit | Attending: Hematology and Oncology | Admitting: Hematology and Oncology

## 2016-08-02 DIAGNOSIS — Z96642 Presence of left artificial hip joint: Secondary | ICD-10-CM | POA: Insufficient documentation

## 2016-08-02 DIAGNOSIS — N200 Calculus of kidney: Secondary | ICD-10-CM | POA: Diagnosis not present

## 2016-08-02 DIAGNOSIS — I700 Atherosclerosis of aorta: Secondary | ICD-10-CM | POA: Insufficient documentation

## 2016-08-02 DIAGNOSIS — C7951 Secondary malignant neoplasm of bone: Secondary | ICD-10-CM

## 2016-08-02 DIAGNOSIS — R918 Other nonspecific abnormal finding of lung field: Secondary | ICD-10-CM | POA: Insufficient documentation

## 2016-08-02 DIAGNOSIS — K429 Umbilical hernia without obstruction or gangrene: Secondary | ICD-10-CM | POA: Diagnosis not present

## 2016-08-02 DIAGNOSIS — C642 Malignant neoplasm of left kidney, except renal pelvis: Secondary | ICD-10-CM | POA: Diagnosis present

## 2016-08-02 DIAGNOSIS — M1990 Unspecified osteoarthritis, unspecified site: Secondary | ICD-10-CM | POA: Insufficient documentation

## 2016-08-02 DIAGNOSIS — C7800 Secondary malignant neoplasm of unspecified lung: Secondary | ICD-10-CM | POA: Diagnosis not present

## 2016-08-02 DIAGNOSIS — D649 Anemia, unspecified: Secondary | ICD-10-CM | POA: Insufficient documentation

## 2016-08-08 ENCOUNTER — Inpatient Hospital Stay (HOSPITAL_BASED_OUTPATIENT_CLINIC_OR_DEPARTMENT_OTHER): Payer: 59 | Admitting: Hematology and Oncology

## 2016-08-08 ENCOUNTER — Inpatient Hospital Stay: Payer: 59

## 2016-08-08 VITALS — BP 119/72 | HR 79 | Temp 98.9°F | Resp 18 | Wt 239.9 lb

## 2016-08-08 DIAGNOSIS — Z85828 Personal history of other malignant neoplasm of skin: Secondary | ICD-10-CM

## 2016-08-08 DIAGNOSIS — D649 Anemia, unspecified: Secondary | ICD-10-CM

## 2016-08-08 DIAGNOSIS — F329 Major depressive disorder, single episode, unspecified: Secondary | ICD-10-CM

## 2016-08-08 DIAGNOSIS — K219 Gastro-esophageal reflux disease without esophagitis: Secondary | ICD-10-CM

## 2016-08-08 DIAGNOSIS — M1990 Unspecified osteoarthritis, unspecified site: Secondary | ICD-10-CM

## 2016-08-08 DIAGNOSIS — R5383 Other fatigue: Secondary | ICD-10-CM

## 2016-08-08 DIAGNOSIS — I10 Essential (primary) hypertension: Secondary | ICD-10-CM

## 2016-08-08 DIAGNOSIS — I252 Old myocardial infarction: Secondary | ICD-10-CM

## 2016-08-08 DIAGNOSIS — I951 Orthostatic hypotension: Secondary | ICD-10-CM

## 2016-08-08 DIAGNOSIS — F419 Anxiety disorder, unspecified: Secondary | ICD-10-CM | POA: Diagnosis not present

## 2016-08-08 DIAGNOSIS — C642 Malignant neoplasm of left kidney, except renal pelvis: Secondary | ICD-10-CM

## 2016-08-08 DIAGNOSIS — I509 Heart failure, unspecified: Secondary | ICD-10-CM

## 2016-08-08 DIAGNOSIS — M129 Arthropathy, unspecified: Secondary | ICD-10-CM | POA: Diagnosis not present

## 2016-08-08 DIAGNOSIS — C7800 Secondary malignant neoplasm of unspecified lung: Secondary | ICD-10-CM

## 2016-08-08 DIAGNOSIS — Z79899 Other long term (current) drug therapy: Secondary | ICD-10-CM

## 2016-08-08 DIAGNOSIS — C7951 Secondary malignant neoplasm of bone: Secondary | ICD-10-CM

## 2016-08-08 DIAGNOSIS — M2550 Pain in unspecified joint: Secondary | ICD-10-CM | POA: Diagnosis not present

## 2016-08-08 DIAGNOSIS — Z86718 Personal history of other venous thrombosis and embolism: Secondary | ICD-10-CM

## 2016-08-08 DIAGNOSIS — R42 Dizziness and giddiness: Secondary | ICD-10-CM

## 2016-08-08 DIAGNOSIS — Z923 Personal history of irradiation: Secondary | ICD-10-CM

## 2016-08-08 DIAGNOSIS — N3289 Other specified disorders of bladder: Secondary | ICD-10-CM

## 2016-08-08 DIAGNOSIS — I82532 Chronic embolism and thrombosis of left popliteal vein: Secondary | ICD-10-CM

## 2016-08-08 DIAGNOSIS — E785 Hyperlipidemia, unspecified: Secondary | ICD-10-CM

## 2016-08-08 DIAGNOSIS — Z7901 Long term (current) use of anticoagulants: Secondary | ICD-10-CM

## 2016-08-08 LAB — COMPREHENSIVE METABOLIC PANEL
ALT: 8 U/L — ABNORMAL LOW (ref 17–63)
AST: 14 U/L — ABNORMAL LOW (ref 15–41)
Albumin: 3.1 g/dL — ABNORMAL LOW (ref 3.5–5.0)
Alkaline Phosphatase: 60 U/L (ref 38–126)
Anion gap: 7 (ref 5–15)
BUN: 16 mg/dL (ref 6–20)
CO2: 29 mmol/L (ref 22–32)
Calcium: 9 mg/dL (ref 8.9–10.3)
Chloride: 100 mmol/L — ABNORMAL LOW (ref 101–111)
Creatinine, Ser: 0.94 mg/dL (ref 0.61–1.24)
GFR calc Af Amer: >60 mL/min (ref >60)
GFR calc non Af Amer: >60 mL/min (ref >60)
Glucose, Bld: 104 mg/dL — ABNORMAL HIGH (ref 65–99)
Potassium: 4.1 mmol/L (ref 3.5–5.1)
Sodium: 136 mmol/L (ref 135–145)
Total Bilirubin: 0.5 mg/dL (ref 0.3–1.2)
Total Protein: 8 g/dL (ref 6.5–8.1)

## 2016-08-08 LAB — CBC WITH DIFFERENTIAL/PLATELET
Basophils Absolute: 0.1 10*3/uL (ref 0–0.1)
Basophils Relative: 1 %
Eosinophils Absolute: 0.3 10*3/uL (ref 0–0.7)
Eosinophils Relative: 3 %
HCT: 24.5 % — ABNORMAL LOW (ref 40.0–52.0)
Hemoglobin: 7.7 g/dL — ABNORMAL LOW (ref 13.0–18.0)
Lymphocytes Relative: 11 %
Lymphs Abs: 1 10*3/uL (ref 1.0–3.6)
MCH: 22.7 pg — ABNORMAL LOW (ref 26.0–34.0)
MCHC: 31.3 g/dL — ABNORMAL LOW (ref 32.0–36.0)
MCV: 72.3 fL — ABNORMAL LOW (ref 80.0–100.0)
Monocytes Absolute: 0.9 10*3/uL (ref 0.2–1.0)
Monocytes Relative: 10 %
Neutro Abs: 6.8 10*3/uL — ABNORMAL HIGH (ref 1.4–6.5)
Neutrophils Relative %: 75 %
Platelets: 338 10*3/uL (ref 150–440)
RBC: 3.39 MIL/uL — ABNORMAL LOW (ref 4.40–5.90)
RDW: 20.1 % — ABNORMAL HIGH (ref 11.5–14.5)
WBC: 8.9 10*3/uL (ref 3.8–10.6)

## 2016-08-08 LAB — SAMPLE TO BLOOD BANK

## 2016-08-08 LAB — MAGNESIUM: Magnesium: 1.8 mg/dL (ref 1.7–2.4)

## 2016-08-08 MED ORDER — SODIUM CHLORIDE 0.9 % IV SOLN
240.0000 mg | Freq: Once | INTRAVENOUS | Status: AC
  Administered 2016-08-08: 240 mg via INTRAVENOUS
  Filled 2016-08-08: qty 20

## 2016-08-08 MED ORDER — SODIUM CHLORIDE 0.9% FLUSH
10.0000 mL | INTRAVENOUS | Status: DC | PRN
  Filled 2016-08-08: qty 10

## 2016-08-08 MED ORDER — SODIUM CHLORIDE 0.9 % IV SOLN
Freq: Once | INTRAVENOUS | Status: AC
Start: 2016-08-08 — End: 2016-08-08
  Administered 2016-08-08: 11:00:00 via INTRAVENOUS
  Filled 2016-08-08: qty 1000

## 2016-08-08 MED ORDER — HEPARIN SOD (PORK) LOCK FLUSH 100 UNIT/ML IV SOLN: 500.0000 [IU] | Freq: Once | INTRAVENOUS | Status: DC | PRN

## 2016-08-08 NOTE — Progress Notes (Signed)
Patient is not eating much at all.  Does supplement with Ensure.  Not sleeping well.  States he has been taken off his BP meds.  Exertional SOB. Accompanied by wife today.

## 2016-08-08 NOTE — Progress Notes (Signed)
[facility] day:  08/08/16   Chief Complaint: Marc Blake is a 61 y.o. male with metastatic renal cell carcinoma who is seen for assessment prior to reinitiation of Opdivo.  HPI: The patient was last seen n the medical oncology clinic on 07/31/2016.  At that time, he was seen for re-evaluation after radical resection of the left femur and hemiarthoplasty.  He was fatigued.  Fluid intake was poor.  He had diffuse joint pain unrelated to nivolumab.  He had orthostatic hypotension.  He received IVF.  His blood pressure medications were held.  He was to follow-up with Dr. Netty Blake regarding his blood pressure.  Dr. Jefm Blake was consulted regarding his arthritis.  CBC at last visit included a hematocrit of 24.0, hemoglobin 7.5, MCV 74.6, platelets 354,000, white count 9000 with an [place] of 900.  Creatinine was 1.54.  Ferritin was 318 with an iron saturation of 12% and a TIBC of 217 (low).  Sedimentation rate was > 140.  Retic was 2.5%.  Restaging CT scans showed decreasing left kidney lesion, stable to increase in multifocal pulmonary nodules and a similar appearance of prominent upper abdominal lymph nodes that were worrisome for metastatic adenopathy.    Symptomatically,  he has been feeling fatigued.  He has not had an appetite.  He occasionally drinks an Ensure. Weight down is 2 lbs. He has been checking his BP daily and it is much better.  He continues to hold his BP medications per  Dr. Netty Blake.  He is having trouble getting in and out of bed and having energy to do anything. He would rather just lay in bed because of his joint pain. He states his diffuse joint pain is intolerable. He uses a bedside commode.  He has an appt with Dr. Annalee Blake (rheumatology) next Tuesday, 08/15/2016.    Past Medical History:  Diagnosis Date  . Acid reflux   . Anxiety   . Arthritis   . CHF (congestive heart failure) ([place])   . Depression   . Depression   .  Hyperlipidemia   . Hypertension   . Myocardial infarction   . Renal cancer ([place])   . Renal cell carcinoma ([place])   . Skin cancer     Past Surgical History:  Procedure Laterality Date  . BACK SURGERY    . CARDIAC CATHETERIZATION     with stent  . CYSTOSCOPY WITH BIOPSY  03/06/2016   Procedure: CYSTOSCOPY WITH BIOPSY;  Surgeon: Marc Espy, MD;  Location: [facility] ORS;  Service: Urology;;  . Marc Blake WITH URETHRAL DILATATION N/A 03/06/2016   Procedure: CYSTOSCOPY WITH URETHRAL DILATATION WITH CATHETER PLACEMENT;  Surgeon: Marc Espy, MD;  Location: [facility] ORS;  Service: Urology;  Laterality: N/A;  . KNEE SURGERY Left   . LEG SURGERY Left   . TONSILLECTOMY      Family History  Problem Relation Age of Onset  . Hypertension Father   . Heart attack Father   . Stroke Sister   . Hypertension Brother   . Stroke Maternal Uncle     Social History:  reports that he has never smoked. He has never used smokeless tobacco. He reports that he does not drink alcohol or use drugs.  He is originally from [place].  He then lived in [place] for 17 years.  He moved to [place] with his wife.  The patient is accompanied by his wife today.  Allergies: No Known Allergies  Current Medications: Current Outpatient Prescriptions  Medication  Sig Dispense Refill  . atorvastatin (LIPITOR) 20 MG tablet TAKE 1 TABLET ONCE A DAY (AT BEDTIME) FOR 30 DAYS  2  . busPIRone (BUSPAR) 7.5 MG tablet     . [facility] GENTLE LAXATIVE 10 MG suppository INSERT 1 SUPPOSITORY RECTALLY EVERY 24HRS AS NEEDED FOR CONSTIPATION  0  . [facility] STOOL SOFTENER 8.6-50 MG tablet TAKE 1 TABLET BY MOUTH TWICE A DAY AS NEEDED CONSTIPATION  0  . fluocinonide cream (LIDEX) 0.05 %     . gabapentin (NEURONTIN) 300 MG capsule Take 300 mg by mouth 3 (three) times daily.     Marland Kitchen morphine (MS CONTIN) 30 MG 12 hr tablet     . oxyCODONE (OXY IR/ROXICODONE) 5 MG immediate release tablet Take 10 mg by mouth every 6 (six) hours as needed (every 6 h prn).      . sertraline (ZOLOFT) 100 MG tablet Take 100 mg by mouth at bedtime.  1  . traZODone (DESYREL) 50 MG tablet Take 50 mg by mouth at bedtime.   3  . XARELTO 20 MG TABS tablet Take 20 mg by mouth daily.  1  . amLODipine (NORVASC) 10 MG tablet Take 10 mg by mouth daily.  3  . carvedilol (COREG) 12.5 MG tablet TAKE 1 TABLET BY MOUTH TWICE A DAY FOR 30 DAYS  3  . ibuprofen (ADVIL,MOTRIN) 200 MG tablet Take 200 mg by mouth every 8 (eight) hours as needed for moderate pain. 4 every 8 hours    . ipratropium-albuterol (DUONEB) 0.5-2.5 (3) MG/3ML SOLN     . lisinopril (PRINIVIL,ZESTRIL) 10 MG tablet     . Nivolumab (OPDIVO IV) Inject into the vein.    Marland Kitchen senna (SENOKOT) 8.6 MG tablet Take 2 tablets by mouth at bedtime.     . triamcinolone cream (KENALOG) 0.5 % Apply topically 2 (two) times daily. (Patient not taking: Reported on 08/08/2016) 30 g 1   Current Facility-Administered Medications  Medication Dose Route Frequency Provider Last Rate Last Dose  . 0.9 %  sodium chloride infusion   Intravenous Once [name], MD       Facility-Administered Medications Ordered in Other Visits  Medication Dose Route Frequency Provider Last Rate Last Dose  . heparin lock flush 100 unit/mL  500 Units Intracatheter Once PRN [name], MD      . sodium chloride flush (NS) 0.9 % injection 10 mL  10 mL Intracatheter PRN [name], MD        Review of Systems:  GENERAL:  Feels tired.  Could sleep 24/7.  No fevers or sweats.  Weight down 2 pounds. PERFORMANCE STATUS (ECOG):  1 HEENT:  No visual changes, runny nose, sore throat, mouth sores or tenderness.  Waiting on decision to fix teeth. Lungs:  No shortness of breath, cough or wheezing.  No hemoptysis. Cardiac:  No chest pain, palpitations, orthopnea, or PND. GI:  Appetite is poor.  No nausea, vomiting, diarrhea, constipation, melena or hematochezia. GU:  Voiding well.  No urgency, frequency, dysuria, or hematuria. Musculoskeletal:   Healing well s/p surgery at [facility].  Joint pain diffuse(see HPI).  No muscle tenderness. Extremities:  Chronic left lower extremity swelling. Skin:  Eczema on elbows and back, improved off nivolumab. Neuro:  Poor memeory.  No headache, numbness or weakness.  Decreased feeling left leg (chronic).  Left leg feels better than right leg. Endocrine:  No diabetes, thyroid issues, hot flashes or night sweats. Psych:  Wife believes "fight not in him".  Worn  down by joint pain.  Easier to just "lay in bed".  Sleep is "so-so". Pain: Joint pain. Review of systems:  All other systems reviewed and found to be negative.  Physical Exam: Blood pressure 119/72, pulse 79, temperature 98.9 F (37.2 C), temperature source Tympanic, resp. rate 18, weight 239 lb 13.8 oz (108.8 kg). GENERAL:  Well developed, well nourished, slightly fatigued appearing gentleman sitting comfortably in a wheelchair in the exam room in no acute distress.  MENTAL STATUS:  Alert and oriented to person, place and time. HEAD:  Long gray hair with goatee.  Normocephalic, atraumatic, face symmetric, no Cushingoid features. EYES:  Pupils equal round and reactive to light and accomodation.  No conjunctivitis or scleral icterus. ENT:  Oropharynx clear without lesion.  Poor dentition.  Tongue normal. Mucous membranes moist.  RESPIRATORY:  Clear to auscultation without rales, wheezes or rhonchi. CARDIOVASCULAR:  Regular rate and rhythm without murmur, rub or gallop.  No JVD. ABDOMEN:  Soft, non-tender, with active bowel sounds, and no hepatosplenomegaly.  No masses. SKIN:  Tattoos. Eczema on dorsal surface of forearms and lower back, markedly improved. EXTREMITIES:  Right wrist splint.  No skin discoloration or tenderness.  No palpable cords. LYMPH NODES: No palpable cervical, supraclavicular, axillary or inguinal adenopathy  NEUROLOGICAL:  Stable.  Sitting in wheelchair. PSYCH:  Appropriate.  . Imaging studies:  12/28/2013:  Abdominal and  pelvic CT scan revealed an 8.5 x 10.2 x 9.2 cm irregularly enhancing mass of the left kidney with some internal calcifications. There were enlarged left sided renal hilar lymph nodes. There were multiple pulmonary nodules in both lower lobes (largest 1.1 cm). There was a destructive bone lesion with an irregular 8.6 x 5.1 cm soft tissue mass invasive of the spinal canal narrowing at least 50% and disturbing the pedicle and portions of the left transverse process rib and T8 vertebral body.  01/15/2014:  Chest CT revealed innumerable pulmonary nodules, a large metastatic lesion at T8 and left eighth rib with invasion into the spinal canal and moderate canal stenosis. There was a left upper pole right kidney mass.  Head MRI was negative. 05/31/2015:  Chest, abdomen, and pelvic CT revealed slight enlargement of multiple pulmonary nodules.  The left renal mass was similar (12.2 x 9.6 cm).  The right kidney lesion was stable (lower pole lesion 2 x 1.7 cm).  Bone scan on 05/31/2015 revealed slight increased activity in the left costal vertebral junction at T7. 12/14/2015:  Chest, abdomen, and pelvic CT revealed an irregular 10.7 cm heterogeneous renal cortical mass in the upper left kidney and a heterogeneous 2.3 cm renal cortical mass in the lateral lower right kidney.  There was mild bilateral hydroureteronephrosis with bladder distention.  There was mild left para-aortic lymphadenopathy.  There were numerous (greater than 20) pulmonary metastases.  Largest nodule was 2.1 cm in the RLL. There was a partially visualized mildly expansile lytic lesion in the left proximal femur status post surgical transfixation.  Thre was a small probably loculated pleural effusion at the left eighth rib resection site.  There were no other bone abnormalities. 12/24/2015:  Bone scan revealed photopenia from prior removal of the left eighth rib. There was increased  uptake in the medial thoracic spine from T7 and T9 is felt to be due to  postoperative fusion.  There was increased uptake throughout much of the left femur as well as increased uptake in the soft tissues of much of the left femur which may be due to previous  surgery and radiation therapy change. 03/10/2016:  Chest CT revealed unchanged bilateral pulmonary nodules and a left renal mass. 08/08/2016:  Chest, abdomen, and pelvic CT revealed decreasing left kidney lesion, stable to increase in multifocal pulmonary nodules and a similar appearance of prominent upper abdominal lymph nodes that were worrisome for metastatic adenopathy.     Appointment on 08/08/2016  Component Date Value Ref Range Status  . WBC 08/08/2016 8.9  3.8 - 10.6 K/uL Final  . RBC 08/08/2016 3.39* 4.40 - 5.90 MIL/uL Final  . Hemoglobin 08/08/2016 7.7* 13.0 - 18.0 g/dL Final  . HCT 02/62/6923 24.5* 40.0 - 52.0 % Final  . MCV 08/08/2016 72.3* 80.0 - 100.0 fL Final  . MCH 08/08/2016 22.7* 26.0 - 34.0 pg Final  . MCHC 08/08/2016 31.3* 32.0 - 36.0 g/dL Final  . RDW 90/74/9736 20.1* 11.5 - 14.5 % Final  . Platelets 08/08/2016 338  150 - 440 K/uL Final  . Neutrophils Relative % 08/08/2016 75  % Final  . Neutro Abs 08/08/2016 6.8* 1.4 - 6.5 K/uL Final  . Lymphocytes Relative 08/08/2016 11  % Final  . Lymphs Abs 08/08/2016 1.0  1.0 - 3.6 K/uL Final  . Monocytes Relative 08/08/2016 10  % Final  . Monocytes Absolute 08/08/2016 0.9  0.2 - 1.0 K/uL Final  . Eosinophils Relative 08/08/2016 3  % Final  . Eosinophils Absolute 08/08/2016 0.3  0 - 0.7 K/uL Final  . Basophils Relative 08/08/2016 1  % Final  . Basophils Absolute 08/08/2016 0.1  0 - 0.1 K/uL Final  . Sodium 08/08/2016 136  135 - 145 mmol/L Final  . Potassium 08/08/2016 4.1  3.5 - 5.1 mmol/L Final  . Chloride 08/08/2016 100* 101 - 111 mmol/L Final  . CO2 08/08/2016 29  22 - 32 mmol/L Final  . Glucose, Bld 08/08/2016 104* 65 - 99 mg/dL Final  . BUN 66/59/6607 16  6 - 20 mg/dL Final  . Creatinine, Ser 08/08/2016 0.94  0.61 - 1.24 mg/dL Final  .  Calcium 21/08/7481 9.0  8.9 - 10.3 mg/dL Final  . Total Protein 08/08/2016 8.0  6.5 - 8.1 g/dL Final  . Albumin 64/15/5577 3.1* 3.5 - 5.0 g/dL Final  . AST 70/17/9712 14* 15 - 41 U/L Final  . ALT 08/08/2016 8* 17 - 63 U/L Final  . Alkaline Phosphatase 08/08/2016 60  38 - 126 U/L Final  . Total Bilirubin 08/08/2016 0.5  0.3 - 1.2 mg/dL Final  . GFR calc non Af Amer 08/08/2016 >60  >60 mL/min Final  . GFR calc Af Amer 08/08/2016 >60  >60 mL/min Final   Comment: (NOTE) The eGFR has been calculated using the CKD EPI equation. This calculation has not been validated in all clinical situations. eGFR's persistently <60 mL/min signify possible Chronic Kidney Disease.   . Anion gap 08/08/2016 7  5 - 15 Final  . Magnesium 08/08/2016 1.8  1.7 - 2.4 mg/dL Final  . Blood Bank Specimen 08/08/2016 SAMPLE AVAILABLE FOR TESTING   Final  . Sample Expiration 08/08/2016 08/11/2016   Final    Assessment:  MARLEY CHARLOT is a 61 y.o. male with metastatic renal cell carcinoma presenting in 12/2013.  He had a large left renal mass, rib and vertebral body involvement, and multiple pulmonary nodules.  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    He was on Votrient (pazopanib) from 02/13/2014 - 01/21/2016.  He began nivolumab on 01/28/2016.  He received 3000 cGy  to T7 - T9 and associated ribs beginning 05/11/2014.  On 06/15/2014, he received 1 dose of nivolumab.  He presented with progressive paralysis from the waist down on 07/06/2014.  He underwent left posterolateral thoracotomy with left chest wall resection with T8 corpectomy and interbody fusion with donor bone on 07/06/2014.  Pathology revealed metastatic renal cell carcinoma. Post-operatively, he developed sepsis secondary to a submandibular abscess dental abscess.  He underwent tracheostomy, I&D of a right neck abscess, tooth extraction, and PEG tube placement.  Zometa has subsequently been on hold.  He  developed a pathologic fracture of the left femur.  He is s/p intramedullary nailing on 01/27/2015.  He received radiation to the left leg.  He was admitted on 04/26/2015 with aspiration pneumonia and opioid overdose. He is currently taking MSContin 15 mg BID and oxycodone 5 mg q day.  He developed a left lower extremity DVT (popliteal and soleal vein) on 06/07/2015.  Left lower extremity ultrasound on 02/07/2016 revealed no evidence of DVT.  He is on long term Xarelto.  Patient has a long standing history of an obstructive uropathy and elevated PVR.  He has undergone urethral dilatation on several occasions.  He underwent cystoscopy, balloon urethral dilatation, and bladder biopsy with Foley catheter placement on 03/06/2016.  Bladder biopsy revealed cystitis with reactive changes, negative for atypia and malignancy.  He is s/p 8 cycles of nivolumab (01/28/2016 - 06/06/2016).  He tolerated his infusions well.  TSH was 6.239 (0.35-4.50) with a normal free T4 (0.79) on 02/25/2016.  TSH was 2.713 (normal) and free T4 0.79 (normal) on 05/19/2016.  Treatment was held on 05/19/2016 secondary to diarrhea and on 05/26/2016 secondary to unexplained hypotension.   Cortisol level was 7.9 with a repeat of 10.3 (normal) on 05/29/2016 at 8:26 AM.  ACTH was 39.1 (normal) at 10 AM on 05/26/2016.  He was admitted to [facility] from 02/14/2016 - 02/16/2016 with chest pain.  Chest CT angiogram on 02/15/2016 ruled out pulmonary embolism. Imaging revealed slight progression of mediastinal left hilar adenopathy was pulmonary metastatic disease. Stress test on 02/15/2016 ruled out cardiac ischemia.  He was treated with Augmentin for presumed lower extremity cellulitis.  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  There is possible increase in change in the lytic lesion with some movement at the fracture site.  He has broken the distal locking screw.  He ambulates with a rolling walker.  Bone scan at [facility] on 06/22/2016  revealed no definite evidence of osseous metastatic disease.  There were photopenic regions overlying the left posterior eighth rib and the proximal left femur, likely representing prior surgical resection  He underwent removal of prior rod/intramedullary nail, radical resection of left femur, resection of tumor, and left hemiarthroplasty at [facility] on 06/16/2016.  Pathology of the left proximal femur revealed metastatic carcinoma compatible with renal cell carcinoma.  He is scheduled to begin physical therapy.   He has a progressive microcytic anemia c/w iron deficiency and anemia of chronic disease.  After his surgery at [facility] in 05/2016, he required 2 units of PRBCs.  MCV is 74.  Anemia work-up on 07/31/2016 revealed a ferritin 318 (falsely elevated secondary to ESR > 140), iron saturation 12%, TIBC 217 (low), and retic 2.5%.  He has restrictive lung disease secondary to his weight and prior radiation.  He has been prescribed oxygen (not received).    His psoriasis flared on nivolumab, but has improved with topical steroids.  Psoriasis is back to baseline after  2 months off nivolumab.  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  Symptomatically, he is fatigued and does not have an appetite. He continues to have diffuse joint pain unrelated to nivolumab. His blood pressure has improved.   Plan: 1.  Labs today:  CBC with diff, CMP, Mg. 2.  Hypotension: 119/72- better today. Continue to hold BP meds per PCP. 3.  Rheumatology consult on 08/15/2016 with Dr. Annalee Blake.  4.  Iron deficient anemia: Hgb 7.7, HCT 24.5, MCV 72.3, RDW 20.1, Iron sat 12%. Discussed oral iron supplementation BID.  5.  Preauth Venofer. 6.  Constipation: Continue Colace and Senokot prn 7.  Dehydration: Renal function improved. Encouraged him to drink more fluids.  8.  Cycle #9 Opdivo (nivolumab) today. 9.  RTC in 2 weeks for MD assessment, labs (CBC with diff, CMP, Mg), Opdivo, and +/- Venofer  The patient  was seen and examined.  The assessment and plan was discussed with the patient.  Multiple questions were asked and answered.   We discussed the plan for consultation with rheumatology to assist with his debilitating arthritis unrelated to treatment.  We discussed his declining hematocrit.  We will try oral iron, but if unsuccessful, he will receive IV iron.  Faythe Casa, NP  [name], MD 08/08/2016 , 12:34 PM

## 2016-08-09 ENCOUNTER — Encounter: Payer: Self-pay | Admitting: Occupational Therapy

## 2016-08-09 ENCOUNTER — Other Ambulatory Visit: Payer: Self-pay | Admitting: Hematology and Oncology

## 2016-08-09 ENCOUNTER — Ambulatory Visit: Payer: 59 | Attending: Family Medicine | Admitting: Occupational Therapy

## 2016-08-09 DIAGNOSIS — R278 Other lack of coordination: Secondary | ICD-10-CM | POA: Insufficient documentation

## 2016-08-09 DIAGNOSIS — M6281 Muscle weakness (generalized): Secondary | ICD-10-CM | POA: Insufficient documentation

## 2016-08-09 NOTE — Therapy (Signed)
[place] MAIN [facility] SERVICES [address] [place], [place], [address] Phone: [phone]   Fax:  [phone]  Occupational Therapy Evaluation  Patient Details  Name: Marc Blake MRN: [id number] Date of Birth: 1955/11/08 Referring Provider: Richarda Overlie  Encounter Date: 08/09/2016      OT End of Session - 08/09/16 0626    Visit Number 1   Number of Visits 24   Date for OT Re-Evaluation 11/01/16   Authorization Type 20 visits   OT Start Time 1400   OT Stop Time 1505   OT Time Calculation (min) 65 min   Activity Tolerance Patient tolerated treatment well   Behavior During Therapy [facility] for tasks assessed/performed      Past Medical History:  Diagnosis Date  . Acid reflux   . Anxiety   . Arthritis   . CHF (congestive heart failure) ([place])   . Depression   . Depression   . Hyperlipidemia   . Hypertension   . Myocardial infarction   . Renal cancer ([place])   . Renal cell carcinoma ([place])   . Skin cancer     Past Surgical History:  Procedure Laterality Date  . BACK SURGERY    . CARDIAC CATHETERIZATION     with stent  . CYSTOSCOPY WITH BIOPSY  03/06/2016   Procedure: CYSTOSCOPY WITH BIOPSY;  Surgeon: [name], MD;  Location: [facility] ORS;  Service: Urology;;  . [name] WITH URETHRAL DILATATION N/A 03/06/2016   Procedure: CYSTOSCOPY WITH URETHRAL DILATATION WITH CATHETER PLACEMENT;  Surgeon: [name], MD;  Location: [facility] ORS;  Service: Urology;  Laterality: N/A;  . KNEE SURGERY Left   . LEG SURGERY Left   . TONSILLECTOMY      There were no vitals filed for this visit.      Subjective Assessment - 08/09/16 1417    Subjective  Pt. reports he would like to walk again. Pt. reports he would like to be able to return to work.   Pertinent History Pt. is a 61 y.o. year-old male who was admitted to [facility] for a Radical Resection of Tumor, Left Femur Resection, and Hemiarthroplasty, secondary to a pathological Fracture with IM  Nailing Repair in August of 2017.  Pt. Orthopedic Physician is Dr. Nydia Bouton with [facility]. The patient reports that he was diagnosed with metastatic RCC in January of 2016; he required a large oncologic resection of the kidney leaving him with BLE paralysis.   Patient Stated Goals To be able to walk again, and improve balance.   Pain Score 6    Pain Location Hip   Pain Orientation Left   Pain Descriptors / Indicators Aching   Pain Frequency Constant   Pain Score 6  past 6 months   Pain Location Shoulder   Pain Orientation Right   Pain Descriptors / Indicators Aching;Constant   Pain Type Chronic pain   Pain Onset More than a month ago   Pain Score 6   Pain Location Elbow   Pain Orientation Right   Pain Descriptors / Indicators Aching   Pain Onset More than a month ago  6 months ago   Pain Score 6   Pain Location Wrist   Pain Orientation Right   Pain Descriptors / Indicators Aching   Pain Type Chronic pain   Pain Onset More than a month ago           [facility] OT Assessment - 08/09/16 1420      Assessment   Diagnosis Radical resection  of Tumor, Left Femur Resection, Hemiarthroplasty, Metastatic RCC.   Referring Provider [place]   Onset Date 02/01/15   Prior Therapy OT, PT at inpatient rehab     Precautions   Precautions Posterior Hip   Precaution Comments Hip abductor Brace removed on 07/27/2016     Restrictions   Weight Bearing Restrictions Yes   LLE Weight Bearing Weight bearing as tolerated   Other Position/Activity Restrictions --  2/8 physician ordered TTWB to be progressed slowly to WBAT     Balance Screen   Has the patient fallen in the past 6 months No   Has the patient had a decrease in activity level because of a fear of falling?  No   Is the patient reluctant to leave their home because of a fear of falling?  No     Home  Environment   Family/patient expects to be discharged to: Private residence   Living Arrangements Spouse/significant other   Available Help  at Discharge Family   Type of [place] One level   Bathroom Shower/Tub Walk-in Shower   Bathroom Toilet Standard  Pt. now using a [place].   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 wife assist with socks.   Toilet [place] Independent     IADL   Shopping Needs to be accompanied on any shopping trip  Uses electric cart   Light Housekeeping Does personal laundry completely  w/c   Meal Prep Able to complete simple cold meal and snack prep   Medication Management Is responsible for taking medication in correct dosages at correct time  pillbox   Financial Management --  Wife has transitioned to handling finances.     Written Expression   Dominant Hand Left  writes & eats with the left, throws a ball with the right.    Handwriting --  Impaired legibility     Vision - History   Baseline Vision Wears glasses only for reading     Activity Tolerance   Activity Tolerance Tolerates < 10 min activity with changes in vital signs  Standing activity     Cognition   Overall Cognitive Status Within Functional Limits for tasks assessed     Sensation   Additional Comments Numbness, and tingling in bilateral 2nd, and 3rd digits.     Coordination   Right 9 Hole Peg Test 26   Left 9 Hole Peg Test 21     ROM / Strength   AROM / PROM / Strength AROM     AROM   Overall AROM Comments WFL. Pt. reports it takes awhile  thhroughout the day to achieve full ROM in right shoulder.     Strength   Overall Strength Comments BUE shoulder flexion 4/5, abduction, 4/5, elbow flexion, extension 5/5, wrist extension 4+/5.     Hand Function   Right Hand Grip (lbs) 35   Right Hand Lateral Pinch  14 lbs   Right Hand 3 Point Pinch 16 lbs   Left Hand Grip (lbs) 42   Left Hand Lateral Pinch 12 lbs   Left 3 point pinch 13 lbs                         OT Education - 08/09/16 1750    Education Details OT services   Person(s) Educated Patient   Methods Explanation;Demonstration;Tactile cues;Verbal cues   Comprehension Verbalized understanding;Returned demonstration             OT Long Term Goals - 08/09/16 1805      OT LONG TERM GOAL #1   Title Pt. will improve standing tolerance to be able to complete a standing ADL/IADL task for 11 min. safely with minimal rest breaks.   Baseline limited standing tolerance for ADLs.   Time 12   Period Weeks   Status New     OT LONG TERM GOAL #2   Title Pt. will demonstrate good dynamic standing balance during ADLs/IADLs    Baseline Limited   Time 12   Period Weeks   Status New     OT LONG TERM GOAL #3   Title Pt. will demonstrate energy conservation/work simplification techniques 100% of the time during ADL/IADL tasks.   Baseline Limited knowledge of energy conservation/work simplififcation techniques   Time 12   Period Weeks   Status New     OT LONG TERM GOAL #4   Title Pt. will Improve UE strength by 2 muscle grades to assist with aADL/IADLs.   Baseline Limited UE strength   Time 12   Period Weeks   Status New     OT LONG TERM GOAL #5   Title Pt. will improve [facility] skills to be able to independently write legibly, and efficiently   Baseline limited legibility   Time 12   Period Weeks   Status New     Long Term Additional Goals   Additional Long Term Goals Yes               Plan - 08/09/16 1755    Clinical Impression Statement  Pt. is a 61 y.o. male who was admited to [facility] for a Radical Resection of Tumor, Left Femur Resection, and Hemiarthroplasty of an and IM Nailing Repair following a pathological Femur Fracture. Pt. has Metastatic RCC. Pt. was in rehab for 4 weeks, and now is ready for outpatient OT services. Pt. presents with posterior hip precautions, impaired functional mobility, weakness, pain in multple sites.  Pt. could benefit from skilled OT services to work on improving UE functioning, strength, balance, and activity tolerance in standing during ADL and IADL tasks.   Rehab Potential Good   Clinical Impairments Affecting Rehab Potential Postive indicators: age, motivation, family support. Negative Indicators; Multiple comorbidities.   OT Frequency 2x / week   OT Duration 12 weeks   OT Treatment/Interventions Self-care/ADL training;Therapeutic exercise;Patient/family education;Manual Therapy;Neuromuscular education;Therapeutic exercises;Therapeutic activities;Energy conservation;DME and/or AE instruction;Moist Heat      Patient will benefit from skilled therapeutic intervention in order to improve the following deficits and impairments:  Decreased balance, Decreased strength, Decreased mobility, Impaired UE functional use, Decreased knowledge of precautions, Decreased range of motion, Decreased coordination, Decreased endurance, Impaired flexibility, Difficulty walking, Pain, Decreased activity tolerance, Decreased safety awareness  Visit Diagnosis: Muscle weakness (generalized)  Other lack of coordination    Problem List Patient Active Problem List   Diagnosis Date Noted  . Arthritis  08/01/2016  . Anemia 08/01/2016  . Elevated TSH 03/10/2016  . Mild eczema 02/27/2016  . Dyspnea on exertion 02/14/2016  . Elevated troponin 02/14/2016  . Fracture of right lower extremity 02/03/2016  . H/O resection of rib 02/03/2016  . Enlarged prostate without urinary obstruction 02/01/2016  . Urethral stricture  02/01/2016  . Personal history of deep vein thrombosis 02/01/2016  . CAD (coronary artery disease) 01/19/2016  . Mixed hyperlipidemia 01/19/2016  . Shortness of breath 01/19/2016  . Hydronephrosis 01/10/2016  . Incomplete bladder emptying 01/10/2016  . Chronic pain due to neoplasm 12/13/2015  . Essential hypertension 12/13/2015  . Major depressive disorder with single episode, in partial remission ([place]) 12/13/2015  . Deep venous thrombosis of left popliteal vein ([place]) 06/07/2015  . Renal cell carcinoma ([place]) 01/21/2014  . Bone metastasis ([place]) 01/21/2014  . Lung metastases ([place]) 01/21/2014    Harrel Carina, MS, OTR/L 08/09/2016, 6:14 PM  [place] MAIN [facility] SERVICES [address] [place], [place], [address] Phone: [phone]   Fax:  [phone]  Name: Marc Blake MRN: [id number] Date of Birth: [birth year]

## 2016-08-14 ENCOUNTER — Ambulatory Visit: Payer: 59 | Admitting: Occupational Therapy

## 2016-08-14 DIAGNOSIS — M6281 Muscle weakness (generalized): Secondary | ICD-10-CM | POA: Diagnosis not present

## 2016-08-14 DIAGNOSIS — R278 Other lack of coordination: Secondary | ICD-10-CM

## 2016-08-14 NOTE — Therapy (Signed)
[place] MAIN [facility] SERVICES [address] [place], [place], [address] Phone: [phone]   Fax:  [phone]  Occupational Therapy Treatment  Patient Details  Name: Marc Blake MRN: [id number] Date of Birth: 1956/04/05 Referring Provider: Richarda Overlie  Encounter Date: 08/14/2016      OT End of Session - 08/14/16 8250    Visit Number 2   Number of Visits 24   Date for OT Re-Evaluation 11/01/16   Authorization Type 20 visits   OT Start Time 1350   OT Stop Time 1436   OT Time Calculation (min) 46 min   Activity Tolerance Patient tolerated treatment well   Behavior During Therapy [facility] for tasks assessed/performed      Past Medical History:  Diagnosis Date  . Acid reflux   . Anxiety   . Arthritis   . CHF (congestive heart failure) ([place])   . Depression   . Depression   . Hyperlipidemia   . Hypertension   . Myocardial infarction   . Renal cancer ([place])   . Renal cell carcinoma ([place])   . Skin cancer     Past Surgical History:  Procedure Laterality Date  . BACK SURGERY    . CARDIAC CATHETERIZATION     with stent  . CYSTOSCOPY WITH BIOPSY  03/06/2016   Procedure: CYSTOSCOPY WITH BIOPSY;  Surgeon: [name], MD;  Location: [facility] ORS;  Service: Urology;;  . [name] WITH URETHRAL DILATATION N/A 03/06/2016   Procedure: CYSTOSCOPY WITH URETHRAL DILATATION WITH CATHETER PLACEMENT;  Surgeon: [name], MD;  Location: [facility] ORS;  Service: Urology;  Laterality: N/A;  . KNEE SURGERY Left   . LEG SURGERY Left   . TONSILLECTOMY      There were no vitals filed for this visit.      Subjective Assessment - 08/14/16 1745    Subjective  Pt. reports having left wrist swelling on the volar surface.   Pertinent History Pt. is a 61 y.o. year-old male who was admitted to [facility] for a Radical Resection of Tumor, Left Femur Resection, and Hemiarthroplasty, secondary to a pathological Fracture with IM Nailing Repair in August of 2017.  Pt.  Orthopedic Physician is Dr. Nydia Bouton with [facility]. The patient reports that he was diagnosed with metastatic RCC in January of 2016; he required a large oncologic resection of the kidney leaving him with BLE paralysis.   Patient Stated Goals To be able to walk again, and improve balance.   Currently in Pain? Yes   Pain Score 6    Pain Location Back  intermiiteant pain   Pain Orientation Mid                      OT Treatments/Exercises (OP) - 08/14/16 1804      Neurological Re-education Exercises   Other Exercises 1 Pt. worked on the [place] for 10 min. with constant monitoring of the BUEs. Pt. worked on changing, and alternating forward reverse position every 2 min. Rest breaks were required. Pt. performed 5# dowel ex. for UE strengthening secondary to weakness bilateral shoulder flexion, chest press, circular patterns, and elbow flexion/extension were performed. Pt. Worked with gross gripper with 2 red band ressistance, and the digiflex 1.5#. Pt. Had difficulty with the right hand isolating each digit, and alternating.                     OT Long Term Goals - 08/09/16 1805      OT LONG  TERM GOAL #1   Title Pt. will improve standing tolerance to be able to complete a standing ADL/IADL task for 11 min. safely with minimal rest breaks.   Baseline limited standing tolerance for ADLs.   Time 12   Period Weeks   Status New     OT LONG TERM GOAL #2   Title Pt. will demonstrate good dynamic standing balance during ADLs/IADLs    Baseline Limited   Time 12   Period Weeks   Status New     OT LONG TERM GOAL #3   Title Pt. will demonstrate energy conservation/work simplification techniques 100% of the time during ADL/IADL tasks.   Baseline Limited knowledge of energy conservation/work simplififcation techniques   Time 12   Period Weeks   Status New     OT LONG TERM GOAL #4   Title Pt. will Improve UE strength by 2 muscle grades to assist with aADL/IADLs.   Baseline  Limited UE strength   Time 12   Period Weeks   Status New     OT LONG TERM GOAL #5   Title Pt. will improve [facility] skills to be able to independently write legibly, and efficiently   Baseline limited legibility   Time 12   Period Weeks   Status New     Long Term Additional Goals   Additional Long Term Goals Yes               Plan - 08/14/16 1749    Clinical Impression Statement Pt. reports having localized edema just proximal to the left wrist on the volar surface. Pt. continues to work on overall UE strength, balance, and activity tolerance for ADL, and IADL functioning. Pt. continues to have posterior hip precautions. Pt. scored an 82 on the MAM-20, with indentfying wringing out a towel, and opening a medicine bottle as being very hard to do; and picking up a 1/2 full pitcher, turning a key, and taking things/cards out of a wallet as being a little hard to do. Pt. Was able to tolerate session without difficulty.   Rehab Potential Good   Clinical Impairments Affecting Rehab Potential Postive indicators: age, motivation, family support. Negative Indicators; Multiple comorbidities.   OT Frequency 2x / week   OT Duration 12 weeks   OT Treatment/Interventions Self-care/ADL training;Therapeutic exercise;Patient/family education;Manual Therapy;Neuromuscular education;Therapeutic exercises;Therapeutic activities;Energy conservation;DME and/or AE instruction;Moist Heat   Consulted and Agree with Plan of Care Patient      Patient will benefit from skilled therapeutic intervention in order to improve the following deficits and impairments:  Decreased balance, Decreased strength, Decreased mobility, Impaired UE functional use, Decreased knowledge of precautions, Decreased range of motion, Decreased coordination, Decreased endurance, Impaired flexibility, Difficulty walking, Pain, Decreased activity tolerance, Decreased safety awareness  Visit Diagnosis: Muscle weakness (generalized)  Other  lack of coordination    Problem List Patient Active Problem List   Diagnosis Date Noted  . Arthritis 08/01/2016  . Anemia 08/01/2016  . Elevated TSH 03/10/2016  . Mild eczema 02/27/2016  . Dyspnea on exertion 02/14/2016  . Elevated troponin 02/14/2016  . Fracture of right lower extremity 02/03/2016  . H/O resection of rib 02/03/2016  . Enlarged prostate without urinary obstruction 02/01/2016  . Urethral stricture 02/01/2016  . Personal history of deep vein thrombosis 02/01/2016  . CAD (coronary artery disease) 01/19/2016  . Mixed hyperlipidemia 01/19/2016  . Shortness of breath 01/19/2016  . Hydronephrosis 01/10/2016  . Incomplete bladder emptying 01/10/2016  . Chronic pain due to neoplasm 12/13/2015  .  Essential hypertension 12/13/2015  . Major depressive disorder with single episode, in partial remission ([place]) 12/13/2015  . Deep venous thrombosis of left popliteal vein ([place]) 06/07/2015  . Renal cell carcinoma ([place]) 01/21/2014  . Bone metastasis ([place]) 01/21/2014  . Lung metastases ([place]) 01/21/2014    Harrel Carina, MS, OTR/L 08/14/2016, 6:05 PM  [place] MAIN [facility] SERVICES [address] [place], [place], [address] Phone: [phone]   Fax:  [phone]  Name: Marc Blake MRN: [id number] Date of Birth: 09/22/1955

## 2016-08-14 NOTE — Patient Instructions (Signed)
OT TREATMENT     Neuro muscular re-education:  Therapeutic Exercise:  Pt. worked on the [employer] for 10 min. with constant monitoring of the BUEs. Pt. worked on changing, and alternating forward reverse position every 2 min. Rest breaks were required. Pt. performed 5# dowel ex. for UE strengthening secondary to weakness bilateral shoulder flexion, chest press, circular patterns, and elbow flexion/extension were performed. Pt. Worked with gross gripper with 2 red band ressistance, and the digiflex 1.5#. Pt. Had difficulty with the right hand isolating each digit, and alternating.  Selfcare:  Manual Therapy:

## 2016-08-15 ENCOUNTER — Encounter: Payer: Self-pay | Admitting: Hematology and Oncology

## 2016-08-15 DIAGNOSIS — M2550 Pain in unspecified joint: Secondary | ICD-10-CM | POA: Insufficient documentation

## 2016-08-17 ENCOUNTER — Other Ambulatory Visit: Payer: Self-pay | Admitting: Pain Medicine

## 2016-08-17 ENCOUNTER — Ambulatory Visit: Payer: 59 | Attending: Family Medicine | Admitting: Occupational Therapy

## 2016-08-17 ENCOUNTER — Encounter: Payer: Self-pay | Admitting: Occupational Therapy

## 2016-08-17 DIAGNOSIS — R278 Other lack of coordination: Secondary | ICD-10-CM | POA: Insufficient documentation

## 2016-08-17 DIAGNOSIS — R262 Difficulty in walking, not elsewhere classified: Secondary | ICD-10-CM | POA: Diagnosis present

## 2016-08-17 DIAGNOSIS — M6281 Muscle weakness (generalized): Secondary | ICD-10-CM | POA: Diagnosis not present

## 2016-08-17 NOTE — Therapy (Signed)
[place] MAIN [facility] SERVICES [address] [place], [place], [address] Phone: [phone]   Fax:  [phone]  Occupational Therapy Treatment  Patient Details  Name: Marc Blake MRN: [id number] Date of Birth: 1955-09-11 Referring Provider: Richarda Overlie  Encounter Date: 08/17/2016      OT End of Session - 08/17/16 1506    Visit Number 3   Number of Visits 24   Date for OT Re-Evaluation 11/01/16   Authorization Type 20 visits   OT Start Time 1027   OT Stop Time 1430   OT Time Calculation (min) 45 min   Activity Tolerance Patient tolerated treatment well   Behavior During Therapy [facility] for tasks assessed/performed      Past Medical History:  Diagnosis Date  . Acid reflux   . Anxiety   . Arthritis   . CHF (congestive heart failure) ([place])   . Depression   . Depression   . Hyperlipidemia   . Hypertension   . Myocardial infarction   . Renal cancer ([place])   . Renal cell carcinoma ([place])   . Skin cancer     Past Surgical History:  Procedure Laterality Date  . BACK SURGERY    . CARDIAC CATHETERIZATION     with stent  . CYSTOSCOPY WITH BIOPSY  03/06/2016   Procedure: CYSTOSCOPY WITH BIOPSY;  Surgeon: [name], MD;  Location: [facility] ORS;  Service: Urology;;  . [name] WITH URETHRAL DILATATION N/A 03/06/2016   Procedure: CYSTOSCOPY WITH URETHRAL DILATATION WITH CATHETER PLACEMENT;  Surgeon: [name], MD;  Location: [facility] ORS;  Service: Urology;  Laterality: N/A;  . KNEE SURGERY Left   . LEG SURGERY Left   . TONSILLECTOMY      There were no vitals filed for this visit.      Subjective Assessment - 08/17/16 1502    Subjective  Pt. continues to present with left wrist volar edema. Pt. reports going to the Rheumatologist last week.   Pertinent History Pt. is a 61 y.o. year-old male who was admitted to [facility] for a Radical Resection of Tumor, Left Femur Resection, and Hemiarthroplasty, secondary to a pathological Fracture with  IM Nailing Repair in August of 2017.  Pt. Orthopedic Physician is Dr. Nydia Bouton with [facility]. The patient reports that he was diagnosed with metastatic RCC in January of 2016; he required a large oncologic resection of the kidney leaving him with BLE paralysis.   Patient Stated Goals To be able to walk again, and improve balance.   Currently in Pain? No/denies                      OT Treatments/Exercises (OP) - 08/17/16 1515      Neurological Re-education Exercises   Other Exercises 1 Pt. performed [facility] skills with bilateral hand training to improve speed and dexterity needed for ADL tasks and writing. Pt. demonstrated grasping 1 inch sticks,  inch cylindrical collars, and  inch flat washers on the [employer] pegboard. Pt. performed grasping each item with her 2nd digit and thumb, and storing them in the palm. Pt. presented with difficulty storing  inch objects at a time in the palmar aspect of the hand. Pt. worked on alternating bilateral hand patterns to replace the pegs.    Other Exercises 2 Pt. Worked on the [employer] for 10 min. At level 5 With constant monitoring of the BUEs.  Pt. Worked on changing, and alternating forward reverse position every 2 min. Rest breaks were required. Pt.  performed gross gripping with grip strengthener. Pt. worked on sustaining grip while grasping pegs and reaching at various heights. Gripper was placed in the 4th resistive slot with the white resistive spring. Pt. worked on pinch strengthening in the left hand for lateral, and 3pt. pinch using yellow, red, green, blue, and black resistive clips. Pt. worked on placing the clips at various vertical and horizontal angles. Tactile and verbal cues were required for eliciting the desired movement.                OT Education - 08/17/16 1506    Education provided Yes   Education Details OT services   Person(s) Educated Patient   Methods Explanation;Demonstration;Tactile cues;Verbal cues   Comprehension  Returned demonstration;Verbalized understanding             OT Long Term Goals - 08/09/16 1805      OT LONG TERM GOAL #1   Title Pt. will improve standing tolerance to be able to complete a standing ADL/IADL task for 11 min. safely with minimal rest breaks.   Baseline limited standing tolerance for ADLs.   Time 12   Period Weeks   Status New     OT LONG TERM GOAL #2   Title Pt. will demonstrate good dynamic standing balance during ADLs/IADLs    Baseline Limited   Time 12   Period Weeks   Status New     OT LONG TERM GOAL #3   Title Pt. will demonstrate energy conservation/work simplification techniques 100% of the time during ADL/IADL tasks.   Baseline Limited knowledge of energy conservation/work simplififcation techniques   Time 12   Period Weeks   Status New     OT LONG TERM GOAL #4   Title Pt. will Improve UE strength by 2 muscle grades to assist with aADL/IADLs.   Baseline Limited UE strength   Time 12   Period Weeks   Status New     OT LONG TERM GOAL #5   Title Pt. will improve [facility] skills to be able to independently write legibly, and efficiently   Baseline limited legibility   Time 12   Period Weeks   Status New     Long Term Additional Goals   Additional Long Term Goals Yes               Plan - 08/17/16 1507    Clinical Impression Statement Pt. reports having had a Rheumatololgist appointment last week. Pt. reports having overall discomfort that they are attributing to bone Pain associated with the metastatic Renal cancer to the bone. Pt. continues to work on improving UE strength, and coordination skills needed for ADL and IADLs.   Rehab Potential Good   Clinical Impairments Affecting Rehab Potential Postive indicators: age, motivation, family support. Negative Indicators; Multiple comorbidities.   OT Frequency 2x / week   OT Duration 12 weeks   OT Treatment/Interventions Self-care/ADL training;Therapeutic exercise;Patient/family  education;Manual Therapy;Neuromuscular education;Therapeutic exercises;Therapeutic activities;Energy conservation;DME and/or AE instruction;Moist Heat   Consulted and Agree with Plan of Care Patient      Patient will benefit from skilled therapeutic intervention in order to improve the following deficits and impairments:  Decreased balance, Decreased strength, Decreased mobility, Impaired UE functional use, Decreased knowledge of precautions, Decreased range of motion, Decreased coordination, Decreased endurance, Impaired flexibility, Difficulty walking, Pain, Decreased activity tolerance, Decreased safety awareness  Visit Diagnosis: Muscle weakness (generalized)  Other lack of coordination    Problem List Patient Active Problem List   Diagnosis  Date Noted  . Arthralgia 08/15/2016  . Arthritis 08/01/2016  . Anemia 08/01/2016  . Elevated TSH 03/10/2016  . Mild eczema 02/27/2016  . Dyspnea on exertion 02/14/2016  . Elevated troponin 02/14/2016  . Fracture of right lower extremity 02/03/2016  . H/O resection of rib 02/03/2016  . Enlarged prostate without urinary obstruction 02/01/2016  . Urethral stricture 02/01/2016  . Personal history of deep vein thrombosis 02/01/2016  . CAD (coronary artery disease) 01/19/2016  . Mixed hyperlipidemia 01/19/2016  . Shortness of breath 01/19/2016  . Hydronephrosis 01/10/2016  . Incomplete bladder emptying 01/10/2016  . Chronic pain due to neoplasm 12/13/2015  . Essential hypertension 12/13/2015  . Major depressive disorder with single episode, in partial remission ([place]) 12/13/2015  . Deep venous thrombosis of left popliteal vein ([place]) 06/07/2015  . Renal cell carcinoma ([place]) 01/21/2014  . Bone metastasis ([place]) 01/21/2014  . Lung metastases ([place]) 01/21/2014    Harrel Carina, MS, OTR/L 08/17/2016, 4:10 PM  [place] MAIN [facility] SERVICES [address] [place], [place], [address] Phone: [phone]    Fax:  [phone]  Name: Marc Blake MRN: [id number] Date of Birth: 1955/07/31

## 2016-08-17 NOTE — Patient Instructions (Signed)
OT TREATMENT     Neuro muscular re-education: Pt. performed [facility] skills with bilateral hand training to improve speed and dexterity needed for ADL tasks and writing. Pt. demonstrated grasping 1 inch sticks,  inch cylindrical collars, and  inch flat washers on the [employer] pegboard. Pt. performed grasping each item with her 2nd digit and thumb, and storing them in the palm. Pt. presented with difficulty storing  inch objects at a time in the palmar aspect of the hand. Pt. worked on alternating bilateral hand patterns to replace the pegs.   Therapeutic Exercise:  Pt. Worked on the [employer] for 10 min. At level 5 With constant monitoring of the BUEs.  Pt. Worked on changing, and alternating forward reverse position every 2 min. Rest breaks were required. Pt. performed gross gripping with grip strengthener. Pt. worked on sustaining grip while grasping pegs and reaching at various heights. Gripper was placed in the 4th resistive slot with the white resistive spring. Pt. worked on pinch strengthening in the left hand for lateral, and 3pt. pinch using yellow, red, green, blue, and black resistive clips. Pt. worked on placing the clips at various vertical and horizontal angles. Tactile and verbal cues were required for eliciting the desired movement.    Selfcare:  Manual Therapy:

## 2016-08-21 ENCOUNTER — Other Ambulatory Visit: Payer: Self-pay | Admitting: Oncology

## 2016-08-21 ENCOUNTER — Ambulatory Visit: Payer: 59 | Admitting: Occupational Therapy

## 2016-08-21 DIAGNOSIS — R278 Other lack of coordination: Secondary | ICD-10-CM

## 2016-08-21 DIAGNOSIS — C642 Malignant neoplasm of left kidney, except renal pelvis: Secondary | ICD-10-CM

## 2016-08-21 DIAGNOSIS — M6281 Muscle weakness (generalized): Secondary | ICD-10-CM | POA: Diagnosis not present

## 2016-08-21 NOTE — Patient Instructions (Signed)
OT TREATMENT     Neuro muscular re-education:  Pt. worked on tasks to sustain lateral pinch on resistive tweezers while grasping and moving 2" toothpick sticks from a horizontal flat position to a vertical position in order to place it in the holder. Pt. was able to sustain grasp while positioning and extending the wrist/hand in the necessary alignment needed to place the stick through the top of the holder. Pt. performed [facility] skills training to improve speed and dexterity needed for ADL tasks and writing. Pt. demonstrated grasping 1 inch sticks,  inch cylindrical collars, and  inch flat washers on the [employer] pegboard. Pt. performed grasping each item with her 2nd digit and thumb, and storing them in the palm. Pt. presented with difficulty storing  inch objects at a time in the palmar aspect of the hand.    Therapeutic Exercise:  Pt. Worked on the [employer] for 8 min. On level 3.5 With constant monitoring of the BUEs. Pt. Worked on changing, and alternating forward reverse position every 2 min. Rest breaks were required. Pt. performed gross gripping with grip strengthener. Pt. worked on sustaining grip while grasping pegs and reaching at various heights. Gripper was placed in the 3rd resistive slot with the white resistive spring. Pt. Worked on pinch strengthening in the left hand for lateral, and 3pt. pinch using yellow, red, green, and blue resistive clips. Pt. worked on placing the clips at various vertical and horizontal angles. Tactile and verbal cues were required for eliciting the desired movement.   Manual Therapy:

## 2016-08-21 NOTE — Therapy (Signed)
[place] MAIN [facility] SERVICES [address] [place], [place], [address] Phone: [phone]   Fax:  [phone]  Occupational Therapy Treatment  Patient Details  Name: Marc Blake MRN: [id number] Date of Birth: December 20, 1955 Referring Provider: Richarda Overlie  Encounter Date: 08/21/2016      OT End of Session - 08/21/16 1722    Visit Number 4   Number of Visits 24   Date for OT Re-Evaluation 11/01/16   Authorization Type 20 visits   OT Start Time 4627   OT Stop Time 1430   OT Time Calculation (min) 45 min   Activity Tolerance Patient tolerated treatment well   Behavior During Therapy [facility] for tasks assessed/performed      Past Medical History:  Diagnosis Date  . Acid reflux   . Anxiety   . Arthritis   . CHF (congestive heart failure) ([place])   . Depression   . Depression   . Hyperlipidemia   . Hypertension   . Myocardial infarction   . Renal cancer ([place])   . Renal cell carcinoma ([place])   . Skin cancer     Past Surgical History:  Procedure Laterality Date  . BACK SURGERY    . CARDIAC CATHETERIZATION     with stent  . CYSTOSCOPY WITH BIOPSY  03/06/2016   Procedure: CYSTOSCOPY WITH BIOPSY;  Surgeon: [name], MD;  Location: [facility] ORS;  Service: Urology;;  . [name] WITH URETHRAL DILATATION N/A 03/06/2016   Procedure: CYSTOSCOPY WITH URETHRAL DILATATION WITH CATHETER PLACEMENT;  Surgeon: [name], MD;  Location: [facility] ORS;  Service: Urology;  Laterality: N/A;  . KNEE SURGERY Left   . LEG SURGERY Left   . TONSILLECTOMY      There were no vitals filed for this visit.      Subjective Assessment - 08/21/16 1721    Subjective  Pt. reports he was at work this morning.   Pertinent History Pt. is a 61 y.o. year-old male who was admitted to [facility] for a Radical Resection of Tumor, Left Femur Resection, and Hemiarthroplasty, secondary to a pathological Fracture with IM Nailing Repair in August of 2017.  Pt. Orthopedic Physician  is Dr. Nydia Bouton with [facility]. The patient reports that he was diagnosed with metastatic RCC in January of 2016; he required a large oncologic resection of the kidney leaving him with BLE paralysis.   Patient Stated Goals To be able to walk again, and improve balance.   Currently in Pain? No/denies                      OT Treatments/Exercises (OP) - 08/21/16 0001      Fine Motor Coordination   Other Fine Motor Exercises Pt. worked on tasks to sustain lateral pinch on resistive tweezers while grasping and moving 2" toothpick sticks from a horizontal flat position to a vertical position in order to place it in the holder. Pt. was able to sustain grasp while positioning and extending the wrist/hand in the necessary alignment needed to place the stick through the top of the holder. Pt. performed [facility] skills training to improve speed and dexterity needed for ADL tasks and writing. Pt. demonstrated grasping 1 inch sticks,  inch cylindrical collars, and  inch flat washers on the [employer] pegboard. Pt. performed grasping each item with her 2nd digit and thumb, and storing them in the palm. Pt. presented with difficulty storing  inch objects at a time in the palmar aspect of the hand.  Neurological Re-education Exercises   Other Exercises 1 Pt. Worked on the [employer] for 8 min. On level 3.5 With constant monitoring of the BUEs. Pt. Worked on changing, and alternating forward reverse position every 2 min. Rest breaks were required. Pt. performed gross gripping with grip strengthener. Pt. worked on sustaining grip while grasping pegs and reaching at various heights. Gripper was placed in the 3rd resistive slot with the white resistive spring. Pt. Worked on pinch strengthening in the left hand for lateral, and 3pt. pinch using yellow, red, green, and blue resistive clips. Pt. worked on placing the clips at various vertical and horizontal angles. Tactile and verbal cues were required for eliciting the  desired movement.                 OT Education - 08/21/16 1732    Education Details OT services   Person(s) Educated Patient   Methods Explanation;Demonstration;Tactile cues;Verbal cues   Comprehension Verbalized understanding;Returned demonstration             OT Long Term Goals - 08/09/16 1805      OT LONG TERM GOAL #1   Title Pt. will improve standing tolerance to be able to complete a standing ADL/IADL task for 11 min. safely with minimal rest breaks.   Baseline limited standing tolerance for ADLs.   Time 12   Period Weeks   Status New     OT LONG TERM GOAL #2   Title Pt. will demonstrate good dynamic standing balance during ADLs/IADLs    Baseline Limited   Time 12   Period Weeks   Status New     OT LONG TERM GOAL #3   Title Pt. will demonstrate energy conservation/work simplification techniques 100% of the time during ADL/IADL tasks.   Baseline Limited knowledge of energy conservation/work simplififcation techniques   Time 12   Period Weeks   Status New     OT LONG TERM GOAL #4   Title Pt. will Improve UE strength by 2 muscle grades to assist with aADL/IADLs.   Baseline Limited UE strength   Time 12   Period Weeks   Status New     OT LONG TERM GOAL #5   Title Pt. will improve [facility] skills to be able to independently write legibly, and efficiently   Baseline limited legibility   Time 12   Period Weeks   Status New     Long Term Additional Goals   Additional Long Term Goals Yes               Plan - 08/21/16 1722    Clinical Impression Statement Pt. continues to work on improving UE strength, and [facility] skills for use during ADLS, and IADL tasks. Pt. reports he appears to be weaker in his left hand, than his right, however his right hand coordination appears worse in his right hand during [place], and manipulation tasks.   Rehab Potential Good   Clinical Impairments Affecting Rehab Potential Postive indicators: age, motivation, family support.  Negative Indicators; Multiple comorbidities.   OT Frequency 2x / week   OT Duration 12 weeks   OT Treatment/Interventions Self-care/ADL training;Therapeutic exercise;Patient/family education;Manual Therapy;Neuromuscular education;Therapeutic exercises;Therapeutic activities;Energy conservation;DME and/or AE instruction;Moist Heat   Consulted and Agree with Plan of Care Patient      Patient will benefit from skilled therapeutic intervention in order to improve the following deficits and impairments:  Decreased balance, Decreased strength, Decreased mobility, Impaired UE functional use, Decreased knowledge of precautions, Decreased range of  motion, Decreased coordination, Decreased endurance, Impaired flexibility, Difficulty walking, Pain, Decreased activity tolerance, Decreased safety awareness  Visit Diagnosis: Muscle weakness (generalized)  Other lack of coordination    Problem List Patient Active Problem List   Diagnosis Date Noted  . Arthralgia 08/15/2016  . Arthritis 08/01/2016  . Anemia 08/01/2016  . Elevated TSH 03/10/2016  . Mild eczema 02/27/2016  . Dyspnea on exertion 02/14/2016  . Elevated troponin 02/14/2016  . Fracture of right lower extremity 02/03/2016  . H/O resection of rib 02/03/2016  . Enlarged prostate without urinary obstruction 02/01/2016  . Urethral stricture 02/01/2016  . Personal history of deep vein thrombosis 02/01/2016  . CAD (coronary artery disease) 01/19/2016  . Mixed hyperlipidemia 01/19/2016  . Shortness of breath 01/19/2016  . Hydronephrosis 01/10/2016  . Incomplete bladder emptying 01/10/2016  . Chronic pain due to neoplasm 12/13/2015  . Essential hypertension 12/13/2015  . Major depressive disorder with single episode, in partial remission ([place]) 12/13/2015  . Deep venous thrombosis of left popliteal vein ([place]) 06/07/2015  . Renal cell carcinoma ([place]) 01/21/2014  . Bone metastasis ([place]) 01/21/2014  . Lung metastases ([place]) 01/21/2014     Harrel Carina, MS, OTR/L 08/21/2016, 5:32 PM  [place] MAIN [facility] SERVICES [address] [place], [place], [address] Phone: [phone]   Fax:  [phone]  Name: Marc Blake MRN: [id number] Date of Birth: 03-14-1956

## 2016-08-21 NOTE — Progress Notes (Signed)
Marc Blake:  Marc Blake   Chief Complaint: Marc Blake is a 61 y.o. male with metastatic renal cell carcinoma who is seen for assessment prior to [place].  HPI: Patient return to clinic today for further evaluation and consideration of his next infusion of nivolumab.  He is complaining of swollen hands, but otherwise feels well. He does admit to persistent weakness and fatigue. He has a poor appetite. He denies any chest pain or shortness of breath. He denies any nausea, vomiting, constipation, or diarrhea. Patient offers no further specific complaints.   Past Medical History:  Diagnosis Date  . Acid reflux   . Anxiety   . Arthritis   . CHF (congestive heart failure) ([facility])   . Depression   . Depression   . Hyperlipidemia   . Hypertension   . Myocardial infarction   . Renal cancer ([place])   . Renal cell carcinoma ([place])   . Skin cancer     Past Surgical History:  Procedure Laterality Date  . BACK SURGERY    . CARDIAC CATHETERIZATION     with stent  . CYSTOSCOPY WITH BIOPSY  03/06/2016   Procedure: CYSTOSCOPY WITH BIOPSY;  Surgeon: [name], MD;  Location: [facility] ORS;  Service: Urology;;  . [name] WITH URETHRAL DILATATION N/A 03/06/2016   Procedure: CYSTOSCOPY WITH URETHRAL DILATATION WITH CATHETER PLACEMENT;  Surgeon: [name], MD;  Location: [facility] ORS;  Service: Urology;  Laterality: N/A;  . KNEE SURGERY Left   . LEG SURGERY Left   . TONSILLECTOMY      Family History  Problem Relation Age of Onset  . Hypertension Father   . Heart attack Father   . Stroke Sister   . Hypertension Brother   . Stroke Maternal Uncle     Social History:  reports that he has never smoked. He has never used smokeless tobacco. He reports that he does not drink alcohol or use drugs.  He is originally from [place].  He then lived in [place] for 17 years.  He moved to [place] with his wife.  The patient is accompanied by his wife  today.  Allergies: No Known Allergies  Current Medications: Current Outpatient Prescriptions  Medication Sig Dispense Refill  . atorvastatin (LIPITOR) 20 MG tablet TAKE 1 TABLET ONCE A Blake (AT BEDTIME) FOR 30 DAYS  2  . busPIRone (BUSPAR) 7.5 MG tablet     . carvedilol (COREG) 12.5 MG tablet TAKE 1 TABLET BY MOUTH TWICE A Blake FOR 30 DAYS  3  . [employer] GENTLE LAXATIVE 10 MG suppository INSERT 1 SUPPOSITORY RECTALLY EVERY 24HRS AS NEEDED FOR CONSTIPATION  0  . [employer] STOOL SOFTENER 8.6-50 MG tablet TAKE 1 TABLET BY MOUTH TWICE A Blake AS NEEDED CONSTIPATION  0  . fluocinonide cream (LIDEX) 0.05 %     . gabapentin (NEURONTIN) 300 MG capsule Take 300 mg by mouth 3 (three) times daily.     Marland Kitchen ibuprofen (ADVIL,MOTRIN) 200 MG tablet Take 200 mg by mouth every 8 (eight) hours as needed for moderate pain. 4 every 8 hours    . ipratropium-albuterol (DUONEB) 0.5-2.5 (3) MG/3ML SOLN     . morphine (MS CONTIN) 30 MG 12 hr tablet     . Nivolumab (OPDIVO IV) Inject into the vein.    Marland Kitchen oxyCODONE (OXY IR/ROXICODONE) 5 MG immediate release tablet Take 10 mg by mouth every 6 (six) hours as needed (every 6 h prn).     . senna (  SENOKOT) 8.6 MG tablet Take 2 tablets by mouth at bedtime.     . sertraline (ZOLOFT) 100 MG tablet Take 100 mg by mouth at bedtime.  1  . traZODone (DESYREL) 50 MG tablet Take 50 mg by mouth at bedtime.   3  . triamcinolone cream (KENALOG) 0.5 % Apply topically 2 (two) times daily. 30 g 1  . XARELTO 20 MG TABS tablet Take 20 mg by mouth daily.  1  . amLODipine (NORVASC) 10 MG tablet Take 10 mg by mouth daily.  3  . lisinopril (PRINIVIL,ZESTRIL) 10 MG tablet      Current Facility-Administered Medications  Medication Dose Route Frequency Provider Last Rate Last Dose  . 0.9 %  sodium chloride infusion   Intravenous Once Rosey Bath, MD        Review of Systems:  GENERAL:  Feels tired.  Could sleep 24/7.  No fevers or sweats. PERFORMANCE STATUS (ECOG):  1 HEENT:  No visual changes,  runny nose, sore throat, mouth sores or tenderness.  Waiting on decision to fix teeth. Lungs:  No shortness of breath, cough or wheezing.  No hemoptysis. Cardiac:  No chest pain, palpitations, orthopnea, or PND. GI:  Appetite is poor.  No nausea, vomiting, diarrhea, constipation, melena or hematochezia. GU:  Voiding well.  No urgency, frequency, dysuria, or hematuria. Musculoskeletal:  Healing well s/p surgery at [facility].  Joint pain diffuse(see HPI).  No muscle tenderness. Extremities:  Chronic left lower extremity swelling. Skin:  Eczema on elbows and back, improved off nivolumab. Neuro:  Poor memeory.  No headache, numbness or weakness.  Decreased feeling left leg (chronic).  Left leg feels better than right leg. Endocrine:  No diabetes, thyroid issues, hot flashes or night sweats. Psych:  Wife believes "fight not in him".  Worn down by joint pain.  Easier to just "lay in bed".  Sleep is "so-so". Pain: Joint pain. Review of systems:  All other systems reviewed and found to be negative.  Physical Exam: Blood pressure 132/78, pulse 77, temperature 97.4 F (36.3 C), temperature source Tympanic, resp. rate 18, weight 242 lb 8.1 oz (110 kg). GENERAL:  Well developed, well nourished, slightly fatigued appearing gentleman sitting comfortably in a wheelchair in the exam room in no acute distress.  MENTAL STATUS:  Alert and oriented to person, place and time. HEAD:  Long gray hair with goatee.  Normocephalic, atraumatic, face symmetric, no Cushingoid features. EYES:  Pupils equal round and reactive to light and accomodation.  No conjunctivitis or scleral icterus. ENT:  Oropharynx clear without lesion.  Poor dentition.  Tongue normal. Mucous membranes moist.  RESPIRATORY:  Clear to auscultation without rales, wheezes or rhonchi. CARDIOVASCULAR:  Regular rate and rhythm without murmur, rub or gallop.  No JVD. ABDOMEN:  Soft, non-tender, with active bowel sounds, and no hepatosplenomegaly.  No  masses. SKIN:  Tattoos. Eczema on dorsal surface of forearms and lower back, markedly improved. EXTREMITIES:  Right wrist splint.  No skin discoloration or tenderness.  No palpable cords. LYMPH NODES: No palpable cervical, supraclavicular, axillary or inguinal adenopathy  NEUROLOGICAL:  Stable.  Sitting in wheelchair. PSYCH:  Appropriate.  . Imaging studies:  12/28/2013:  Abdominal and pelvic CT scan revealed an 8.5 x 10.2 x 9.2 cm irregularly enhancing mass of the left kidney with some internal calcifications. There were enlarged left sided renal hilar lymph nodes. There were multiple pulmonary nodules in both lower lobes (largest 1.1 cm). There was a destructive bone lesion with an irregular 8.6  x 5.1 cm soft tissue mass invasive of the spinal canal narrowing at least 50% and disturbing the pedicle and portions of the left transverse process rib and T8 vertebral body.  01/15/2014:  Chest CT revealed innumerable pulmonary nodules, a large metastatic lesion at T8 and left eighth rib with invasion into the spinal canal and moderate canal stenosis. There was a left upper pole right kidney mass.  Head MRI was negative. 05/31/2015:  Chest, abdomen, and pelvic CT revealed slight enlargement of multiple pulmonary nodules.  The left renal mass was similar (12.2 x 9.6 cm).  The right kidney lesion was stable (lower pole lesion 2 x 1.7 cm).  Bone scan on 05/31/2015 revealed slight increased activity in the left costal vertebral junction at T7. 12/14/2015:  Chest, abdomen, and pelvic CT revealed an irregular 10.7 cm heterogeneous renal cortical mass in the upper left kidney and a heterogeneous 2.3 cm renal cortical mass in the lateral lower right kidney.  There was mild bilateral hydroureteronephrosis with bladder distention.  There was mild left para-aortic lymphadenopathy.  There were numerous (greater than 20) pulmonary metastases.  Largest nodule was 2.1 cm in the RLL. There was a partially visualized mildly  expansile lytic lesion in the left proximal femur status post surgical transfixation.  Thre was a small probably loculated pleural effusion at the left eighth rib resection site.  There were no other bone abnormalities. 12/24/2015:  Bone scan revealed photopenia from prior removal of the left eighth rib. There was increased  uptake in the medial thoracic spine from T7 and T9 is felt to be due to postoperative fusion.  There was increased uptake throughout much of the left femur as well as increased uptake in the soft tissues of much of the left femur which may be due to previous surgery and radiation therapy change. 03/10/2016:  Chest CT revealed unchanged bilateral pulmonary nodules and a left renal mass. 08/08/2016:  Chest, abdomen, and pelvic CT revealed decreasing left kidney lesion, stable to increase in multifocal pulmonary nodules and a similar appearance of prominent upper abdominal lymph nodes that were worrisome for metastatic adenopathy.     Appointment on 08/22/2016  Component Date Value Ref Range Status  . WBC 08/22/2016 7.3  3.8 - 10.6 K/uL Final  . RBC 08/22/2016 3.60* 4.40 - 5.90 MIL/uL Final  . Hemoglobin 08/22/2016 8.0* 13.0 - 18.0 g/dL Final  . HCT 08/22/2016 26.3* 40.0 - 52.0 % Final  . MCV 08/22/2016 73.1* 80.0 - 100.0 fL Final  . MCH 08/22/2016 22.3* 26.0 - 34.0 pg Final  . MCHC 08/22/2016 30.6* 32.0 - 36.0 g/dL Final  . RDW 08/22/2016 20.1* 11.5 - 14.5 % Final  . Platelets 08/22/2016 330  150 - 440 K/uL Final  . Neutrophils Relative % 08/22/2016 76  % Final  . Neutro Abs 08/22/2016 5.6  1.4 - 6.5 K/uL Final  . Lymphocytes Relative 08/22/2016 8  % Final  . Lymphs Abs 08/22/2016 0.6* 1.0 - 3.6 K/uL Final  . Monocytes Relative 08/22/2016 10  % Final  . Monocytes Absolute 08/22/2016 0.7  0.2 - 1.0 K/uL Final  . Eosinophils Relative 08/22/2016 5  % Final  . Eosinophils Absolute 08/22/2016 0.3  0 - 0.7 K/uL Final  . Basophils Relative 08/22/2016 1  % Final  . Basophils  Absolute 08/22/2016 0.1  0 - 0.1 K/uL Final  . Sodium 08/22/2016 137  135 - 145 mmol/L Final  . Potassium 08/22/2016 3.9  3.5 - 5.1 mmol/L Final  . Chloride  08/22/2016 102  101 - 111 mmol/L Final  . CO2 08/22/2016 28  22 - 32 mmol/L Final  . Glucose, Bld 08/22/2016 127* 65 - 99 mg/dL Final  . BUN 08/22/2016 18  6 - 20 mg/dL Final  . Creatinine, Ser 08/22/2016 0.93  0.61 - 1.24 mg/dL Final  . Calcium 08/22/2016 9.1  8.9 - 10.3 mg/dL Final  . Total Protein 08/22/2016 8.0  6.5 - 8.1 g/dL Final  . Albumin 08/22/2016 3.4* 3.5 - 5.0 g/dL Final  . AST 08/22/2016 19  15 - 41 U/L Final  . ALT 08/22/2016 8* 17 - 63 U/L Final  . Alkaline Phosphatase 08/22/2016 61  38 - 126 U/L Final  . Total Bilirubin 08/22/2016 0.5  0.3 - 1.2 mg/dL Final  . GFR calc non Af Amer 08/22/2016 >60  >60 mL/min Final  . GFR calc Af Amer 08/22/2016 >60  >60 mL/min Final   Comment: (NOTE) The eGFR has been calculated using the CKD EPI equation. This calculation has not been validated in all clinical situations. eGFR's persistently <60 mL/min signify possible Chronic Kidney Disease.   . Anion gap 08/22/2016 7  5 - 15 Final  . Magnesium 08/22/2016 1.9  1.7 - 2.4 mg/dL Final    Assessment:  Marc Blake is a 61 y.o. male with metastatic renal cell carcinoma presenting in 12/2013.  He had a large left renal mass, rib and vertebral body involvement, and multiple pulmonary nodules.  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    He was on Votrient (pazopanib) from 02/13/2014 - 01/21/2016.  He began nivolumab on 01/28/2016.  He received 3000 cGy to T7 - T9 and associated ribs beginning 05/11/2014.  On 06/15/2014, he received 1 dose of nivolumab.  He presented with progressive paralysis from the waist down on 07/06/2014.  He underwent left posterolateral thoracotomy with left chest wall resection with T8 corpectomy and interbody fusion with donor bone on 07/06/2014.  Pathology  revealed metastatic renal cell carcinoma. Post-operatively, he developed sepsis secondary to a submandibular abscess dental abscess.  He underwent tracheostomy, I&D of a right neck abscess, tooth extraction, and PEG tube placement.  Zometa has subsequently been on hold.  He developed a pathologic fracture of the left femur.  He is s/p intramedullary nailing on 01/27/2015.  He received radiation to the left leg.  He was admitted on 04/26/2015 with aspiration pneumonia and opioid overdose. He is currently taking MSContin 15 mg BID and oxycodone 5 mg q Blake.  He developed a left lower extremity DVT (popliteal and soleal vein) on 06/07/2015.  Left lower extremity ultrasound on 02/07/2016 revealed no evidence of DVT.  He is on long term Xarelto.  Patient has a long standing history of an obstructive uropathy and elevated PVR.  He has undergone urethral dilatation on several occasions.  He underwent cystoscopy, balloon urethral dilatation, and bladder biopsy with Foley catheter placement on 03/06/2016.  Bladder biopsy revealed cystitis with reactive changes, negative for atypia and malignancy.  He is s/p 8 cycles of nivolumab (01/28/2016 - 06/06/2016).  He tolerated his infusions well.  TSH was 6.239 (0.35-4.50) with a normal free T4 (0.79) on 02/25/2016.  TSH was 2.713 (normal) and free T4 0.79 (normal) on 05/19/2016.  Treatment was held on 05/19/2016 secondary to diarrhea and on 05/26/2016 secondary to unexplained hypotension.   Cortisol level was 7.9 with a repeat of 10.3 (normal) on 05/29/2016 at 8:26 AM.  ACTH was 39.1 (normal)  at 10 AM on 05/26/2016.  He was admitted to [facility] from 02/14/2016 - 02/16/2016 with chest pain.  Chest CT angiogram on 02/15/2016 ruled out pulmonary embolism. Imaging revealed slight progression of mediastinal left hilar adenopathy was pulmonary metastatic disease. Stress test on 02/15/2016 ruled out cardiac ischemia.  He was treated with Augmentin for presumed lower extremity  cellulitis.  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  There is possible increase in change in the lytic lesion with some movement at the fracture site.  He has broken the distal locking screw.  He ambulates with a rolling walker.  Bone scan at [facility] on 06/22/2016 revealed no definite evidence of osseous metastatic disease.  There were photopenic regions overlying the left posterior eighth rib and the proximal left femur, likely representing prior surgical resection  He underwent removal of prior rod/intramedullary nail, radical resection of left femur, resection of tumor, and left hemiarthroplasty at [facility] on 06/16/2016.  Pathology of the left proximal femur revealed metastatic carcinoma compatible with renal cell carcinoma.  He is scheduled to begin physical therapy.   He has a progressive microcytic anemia c/w iron deficiency and anemia of chronic disease.  After his surgery at [facility] in 05/2016, he required 2 units of PRBCs.  MCV is 74.  Anemia work-up on 07/31/2016 revealed a ferritin 318 (falsely elevated secondary to ESR > 140), iron saturation 12%, TIBC 217 (low), and retic 2.5%.  He has restrictive lung disease secondary to his weight and prior radiation.  He has been prescribed oxygen (not received).    His psoriasis flared on nivolumab, but has improved with topical steroids.  Psoriasis is back to baseline after 2 months off nivolumab.  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  Plan: 1.  Labs today:  CBC with diff, CMP, Mg. 2.  Hypotension: Improved. Continue to hold BP meds per PCP. 3.  Continue follow-up with [name] in rheumatology has indicated.  4.  Iron deficient anemia: Proceed with venofer today. 5.  Constipation: Continue Colace and Senokot prn 7.  Dehydration: Renal function improved. Encouraged him to drink more fluids.  8.  Cycle #10 Opdivo (nivolumab) today. 9.  RTC in 2 weeks for MD assessment, labs (CBC with diff, CMP, Mg), Opdivo,  and +/- Venofer  Lloyd Huger, MD 08/26/2016 , 6:33 PM

## 2016-08-22 ENCOUNTER — Other Ambulatory Visit: Payer: Self-pay | Admitting: Hematology and Oncology

## 2016-08-22 ENCOUNTER — Inpatient Hospital Stay: Payer: 59

## 2016-08-22 ENCOUNTER — Inpatient Hospital Stay: Payer: 59 | Attending: Hematology and Oncology | Admitting: Oncology

## 2016-08-22 VITALS — BP 132/78 | HR 77 | Temp 97.4°F | Resp 18 | Wt 242.5 lb

## 2016-08-22 DIAGNOSIS — Z79899 Other long term (current) drug therapy: Secondary | ICD-10-CM

## 2016-08-22 DIAGNOSIS — R630 Anorexia: Secondary | ICD-10-CM | POA: Diagnosis not present

## 2016-08-22 DIAGNOSIS — D649 Anemia, unspecified: Secondary | ICD-10-CM

## 2016-08-22 DIAGNOSIS — M129 Arthropathy, unspecified: Secondary | ICD-10-CM | POA: Diagnosis not present

## 2016-08-22 DIAGNOSIS — D509 Iron deficiency anemia, unspecified: Secondary | ICD-10-CM | POA: Insufficient documentation

## 2016-08-22 DIAGNOSIS — R5383 Other fatigue: Secondary | ICD-10-CM | POA: Diagnosis not present

## 2016-08-22 DIAGNOSIS — I509 Heart failure, unspecified: Secondary | ICD-10-CM | POA: Insufficient documentation

## 2016-08-22 DIAGNOSIS — R918 Other nonspecific abnormal finding of lung field: Secondary | ICD-10-CM | POA: Diagnosis not present

## 2016-08-22 DIAGNOSIS — C7951 Secondary malignant neoplasm of bone: Secondary | ICD-10-CM

## 2016-08-22 DIAGNOSIS — Z5111 Encounter for antineoplastic chemotherapy: Secondary | ICD-10-CM | POA: Diagnosis not present

## 2016-08-22 DIAGNOSIS — I252 Old myocardial infarction: Secondary | ICD-10-CM | POA: Diagnosis not present

## 2016-08-22 DIAGNOSIS — I10 Essential (primary) hypertension: Secondary | ICD-10-CM | POA: Diagnosis not present

## 2016-08-22 DIAGNOSIS — C7800 Secondary malignant neoplasm of unspecified lung: Secondary | ICD-10-CM

## 2016-08-22 DIAGNOSIS — Z923 Personal history of irradiation: Secondary | ICD-10-CM | POA: Insufficient documentation

## 2016-08-22 DIAGNOSIS — F329 Major depressive disorder, single episode, unspecified: Secondary | ICD-10-CM

## 2016-08-22 DIAGNOSIS — E785 Hyperlipidemia, unspecified: Secondary | ICD-10-CM

## 2016-08-22 DIAGNOSIS — K5900 Constipation, unspecified: Secondary | ICD-10-CM

## 2016-08-22 DIAGNOSIS — R531 Weakness: Secondary | ICD-10-CM | POA: Diagnosis not present

## 2016-08-22 DIAGNOSIS — C642 Malignant neoplasm of left kidney, except renal pelvis: Secondary | ICD-10-CM | POA: Insufficient documentation

## 2016-08-22 DIAGNOSIS — F419 Anxiety disorder, unspecified: Secondary | ICD-10-CM | POA: Diagnosis not present

## 2016-08-22 DIAGNOSIS — Z7901 Long term (current) use of anticoagulants: Secondary | ICD-10-CM | POA: Insufficient documentation

## 2016-08-22 DIAGNOSIS — Z85828 Personal history of other malignant neoplasm of skin: Secondary | ICD-10-CM | POA: Diagnosis not present

## 2016-08-22 DIAGNOSIS — I82532 Chronic embolism and thrombosis of left popliteal vein: Secondary | ICD-10-CM

## 2016-08-22 DIAGNOSIS — M7989 Other specified soft tissue disorders: Secondary | ICD-10-CM | POA: Insufficient documentation

## 2016-08-22 DIAGNOSIS — K219 Gastro-esophageal reflux disease without esophagitis: Secondary | ICD-10-CM | POA: Diagnosis not present

## 2016-08-22 LAB — COMPREHENSIVE METABOLIC PANEL
ALT: 8 U/L — ABNORMAL LOW (ref 17–63)
AST: 19 U/L (ref 15–41)
Albumin: 3.4 g/dL — ABNORMAL LOW (ref 3.5–5.0)
Alkaline Phosphatase: 61 U/L (ref 38–126)
Anion gap: 7 (ref 5–15)
BUN: 18 mg/dL (ref 6–20)
CO2: 28 mmol/L (ref 22–32)
Calcium: 9.1 mg/dL (ref 8.9–10.3)
Chloride: 102 mmol/L (ref 101–111)
Creatinine, Ser: 0.93 mg/dL (ref 0.61–1.24)
GFR calc Af Amer: >60 mL/min (ref >60)
GFR calc non Af Amer: >60 mL/min (ref >60)
Glucose, Bld: 127 mg/dL — ABNORMAL HIGH (ref 65–99)
Potassium: 3.9 mmol/L (ref 3.5–5.1)
Sodium: 137 mmol/L (ref 135–145)
Total Bilirubin: 0.5 mg/dL (ref 0.3–1.2)
Total Protein: 8 g/dL (ref 6.5–8.1)

## 2016-08-22 LAB — CBC WITH DIFFERENTIAL/PLATELET
Basophils Absolute: 0.1 10*3/uL (ref 0–0.1)
Basophils Relative: 1 %
Eosinophils Absolute: 0.3 10*3/uL (ref 0–0.7)
Eosinophils Relative: 5 %
HCT: 26.3 % — ABNORMAL LOW (ref 40.0–52.0)
Hemoglobin: 8 g/dL — ABNORMAL LOW (ref 13.0–18.0)
Lymphocytes Relative: 8 %
Lymphs Abs: 0.6 10*3/uL — ABNORMAL LOW (ref 1.0–3.6)
MCH: 22.3 pg — ABNORMAL LOW (ref 26.0–34.0)
MCHC: 30.6 g/dL — ABNORMAL LOW (ref 32.0–36.0)
MCV: 73.1 fL — ABNORMAL LOW (ref 80.0–100.0)
Monocytes Absolute: 0.7 10*3/uL (ref 0.2–1.0)
Monocytes Relative: 10 %
Neutro Abs: 5.6 10*3/uL (ref 1.4–6.5)
Neutrophils Relative %: 76 %
Platelets: 330 10*3/uL (ref 150–440)
RBC: 3.6 MIL/uL — ABNORMAL LOW (ref 4.40–5.90)
RDW: 20.1 % — ABNORMAL HIGH (ref 11.5–14.5)
WBC: 7.3 10*3/uL (ref 3.8–10.6)

## 2016-08-22 LAB — MAGNESIUM: Magnesium: 1.9 mg/dL (ref 1.7–2.4)

## 2016-08-22 MED ORDER — IRON SUCROSE 20 MG/ML IV SOLN: 200.0000 mg | Freq: Once | INTRAVENOUS | Status: DC

## 2016-08-22 MED ORDER — NIVOLUMAB CHEMO INJECTION 100 MG/10ML
240.0000 mg | Freq: Once | INTRAVENOUS | Status: AC
  Administered 2016-08-22: 240 mg via INTRAVENOUS
  Filled 2016-08-22: qty 20

## 2016-08-22 MED ORDER — SODIUM CHLORIDE 0.9 % IV SOLN
Freq: Once | INTRAVENOUS | Status: AC
  Filled 2016-08-22: qty 1000

## 2016-08-22 MED ORDER — HEPARIN SOD (PORK) LOCK FLUSH 100 UNIT/ML IV SOLN: 250.0000 [IU] | Freq: Once | INTRAVENOUS | Status: DC | PRN

## 2016-08-22 MED ORDER — IRON SUCROSE 20 MG/ML IV SOLN
200.0000 mg | Freq: Once | INTRAVENOUS | Status: AC
  Administered 2016-08-22: 200 mg via INTRAVENOUS
  Filled 2016-08-22: qty 10

## 2016-08-22 MED ORDER — ALTEPLASE 2 MG IJ SOLR: 2.0000 mg | Freq: Once | INTRAMUSCULAR | Status: DC | PRN

## 2016-08-22 MED ORDER — SODIUM CHLORIDE 0.9 % IV SOLN
Freq: Once | INTRAVENOUS | Status: AC
  Administered 2016-08-22: 11:00:00 via INTRAVENOUS
  Filled 2016-08-22: qty 1000

## 2016-08-22 MED ORDER — HEPARIN SOD (PORK) LOCK FLUSH 100 UNIT/ML IV SOLN: 500.0000 [IU] | Freq: Once | INTRAVENOUS | Status: DC | PRN

## 2016-08-22 MED ORDER — SODIUM CHLORIDE 0.9% FLUSH
10.0000 mL | INTRAVENOUS | Status: DC | PRN
  Filled 2016-08-22: qty 10

## 2016-08-22 NOTE — Progress Notes (Signed)
Patient states his hands are swollen today.  He has been taken off all his BP meds.

## 2016-08-23 ENCOUNTER — Other Ambulatory Visit: Payer: Self-pay | Admitting: *Deleted

## 2016-08-23 DIAGNOSIS — C649 Malignant neoplasm of unspecified kidney, except renal pelvis: Secondary | ICD-10-CM

## 2016-08-24 ENCOUNTER — Ambulatory Visit: Payer: 59

## 2016-08-24 ENCOUNTER — Encounter: Payer: 59 | Admitting: Occupational Therapy

## 2016-08-24 ENCOUNTER — Ambulatory Visit: Payer: 59 | Admitting: Occupational Therapy

## 2016-08-24 DIAGNOSIS — R278 Other lack of coordination: Secondary | ICD-10-CM

## 2016-08-24 DIAGNOSIS — M6281 Muscle weakness (generalized): Secondary | ICD-10-CM

## 2016-08-24 DIAGNOSIS — R262 Difficulty in walking, not elsewhere classified: Secondary | ICD-10-CM

## 2016-08-24 NOTE — Therapy (Signed)
[place] MAIN [facility] SERVICES [address] [place], [place], [address] Phone: [phone]   Fax:  [phone]  Occupational Therapy Treatment  Patient Details  Name: Marc Blake MRN: [id number] Date of Birth: Sep 12, 1955 Referring Provider: Richarda Overlie  Encounter Date: 08/24/2016      OT End of Session - 08/24/16 1717    Visit Number 6   Number of Visits 24   Date for OT Re-Evaluation 11/01/16   Authorization Type 20 visits   OT Start Time 1615   OT Stop Time 1700   OT Time Calculation (min) 45 min   Activity Tolerance Patient tolerated treatment well   Behavior During Therapy [facility] for tasks assessed/performed      Past Medical History:  Diagnosis Date  . Acid reflux   . Anxiety   . Arthritis   . CHF (congestive heart failure) ([place])   . Depression   . Depression   . Hyperlipidemia   . Hypertension   . Myocardial infarction   . Renal cancer ([place])   . Renal cell carcinoma ([place])   . Skin cancer     Past Surgical History:  Procedure Laterality Date  . BACK SURGERY    . CARDIAC CATHETERIZATION     with stent  . CYSTOSCOPY WITH BIOPSY  03/06/2016   Procedure: CYSTOSCOPY WITH BIOPSY;  Surgeon: [name], MD;  Location: [facility] ORS;  Service: Urology;;  . [name] WITH URETHRAL DILATATION N/A 03/06/2016   Procedure: CYSTOSCOPY WITH URETHRAL DILATATION WITH CATHETER PLACEMENT;  Surgeon: [name], MD;  Location: [facility] ORS;  Service: Urology;  Laterality: N/A;  . KNEE SURGERY Left   . LEG SURGERY Left   . TONSILLECTOMY      There were no vitals filed for this visit.      Subjective Assessment - 08/24/16 1714    Subjective  Pt. reports having had an infusion on Tuesday. Pt. reports having a bad day yesterday following the infusion the day before.   Pertinent History Pt. is a 61 y.o. year-old male who was admitted to [facility] for a Radical Resection of Tumor, Left Femur Resection, and Hemiarthroplasty, secondary to a  pathological Fracture with IM Nailing Repair in August of 2017.  Pt. Orthopedic Physician is Dr. Nydia Bouton with [facility]. The patient reports that he was diagnosed with metastatic RCC in January of 2016; he required a large oncologic resection of the kidney leaving him with BLE paralysis.   Patient Stated Goals To be able to walk again, and improve balance.   Currently in Pain? No/denies                      OT Treatments/Exercises (OP) - 08/24/16 1736      Fine Motor Coordination   Other Fine Motor Exercises Pt. Worked on grasping and manipulating long nosed tweezers, and placing small pegs upright  onto a small pegboard. Pt. worked with bilateral hands to complete. Pt. worked on translatory movements of the hand moving objects within the hand. Pt. worked on moving objects while alternating with the 4th, and 5th digits.      Neurological Re-education Exercises   Other Exercises 1 Pt. worked on the [place] for 8 min. With constant monitoring of the BUEs. Pt. worked on changing, and alternating forward reverse position every 2 min. Rest breaks were required. Pt. performed gross gripping with grip strengthener. Pt. worked on sustaining grip while grasping pegs and reaching at various heights. Gripper was placed in the  3rd resistive slot with the white resistive spring.  Pt. worked on the digiflex 1.5#. Pt. Had difficulty separating digits.                     OT Long Term Goals - 08/09/16 1805      OT LONG TERM GOAL #1   Title Pt. will improve standing tolerance to be able to complete a standing ADL/IADL task for 11 min. safely with minimal rest breaks.   Baseline limited standing tolerance for ADLs.   Time 12   Period Weeks   Status New     OT LONG TERM GOAL #2   Title Pt. will demonstrate good dynamic standing balance during ADLs/IADLs    Baseline Limited   Time 12   Period Weeks   Status New     OT LONG TERM GOAL #3   Title Pt. will demonstrate energy  conservation/work simplification techniques 100% of the time during ADL/IADL tasks.   Baseline Limited knowledge of energy conservation/work simplififcation techniques   Time 12   Period Weeks   Status New     OT LONG TERM GOAL #4   Title Pt. will Improve UE strength by 2 muscle grades to assist with aADL/IADLs.   Baseline Limited UE strength   Time 12   Period Weeks   Status New     OT LONG TERM GOAL #5   Title Pt. will improve [facility] skills to be able to independently write legibly, and efficiently   Baseline limited legibility   Time 12   Period Weeks   Status New     Long Term Additional Goals   Additional Long Term Goals Yes               Plan - 08/24/16 1717    Clinical Impression Statement Pt. continues to improve overall with UE functioning. Pt. continues to work on improving activity tolerance, UE strength, coordination, speed, and dexterity for ADL, and IADL functioning. Pt. continues to improve with UE sttrength, and [facility] skills.    Rehab Potential Good   Clinical Impairments Affecting Rehab Potential Postive indicators: age, motivation, family support. Negative Indicators; Multiple comorbidities.   OT Frequency 2x / week   OT Duration 12 weeks   OT Treatment/Interventions Self-care/ADL training;Therapeutic exercise;Patient/family education;Manual Therapy;Neuromuscular education;Therapeutic exercises;Therapeutic activities;Energy conservation;DME and/or AE instruction;Moist Heat   Consulted and Agree with Plan of Care Patient      Patient will benefit from skilled therapeutic intervention in order to improve the following deficits and impairments:  Decreased balance, Decreased strength, Decreased mobility, Impaired UE functional use, Decreased knowledge of precautions, Decreased range of motion, Decreased coordination, Decreased endurance, Impaired flexibility, Difficulty walking, Pain, Decreased activity tolerance, Decreased safety awareness  Visit  Diagnosis: Muscle weakness (generalized)  Other lack of coordination    Problem List Patient Active Problem List   Diagnosis Date Noted  . Arthralgia 08/15/2016  . Arthritis 08/01/2016  . Anemia 08/01/2016  . Elevated TSH 03/10/2016  . Mild eczema 02/27/2016  . Dyspnea on exertion 02/14/2016  . Elevated troponin 02/14/2016  . Fracture of right lower extremity 02/03/2016  . H/O resection of rib 02/03/2016  . Enlarged prostate without urinary obstruction 02/01/2016  . Urethral stricture 02/01/2016  . Personal history of deep vein thrombosis 02/01/2016  . CAD (coronary artery disease) 01/19/2016  . Mixed hyperlipidemia 01/19/2016  . Shortness of breath 01/19/2016  . Hydronephrosis 01/10/2016  . Incomplete bladder emptying 01/10/2016  . Chronic pain due to neoplasm  12/13/2015  . Essential hypertension 12/13/2015  . Major depressive disorder with single episode, in partial remission ([place]) 12/13/2015  . Deep venous thrombosis of left popliteal vein ([place]) 06/07/2015  . Renal cell carcinoma ([place]) 01/21/2014  . Bone metastasis ([place]) 01/21/2014  . Lung metastases ([place]) 01/21/2014    Harrel Carina, MS, OTR/L 08/24/2016, 5:37 PM  [place] MAIN [facility] SERVICES [address] [place], [place], [address] Phone: [phone]   Fax:  [phone]  Name: Marc Blake MRN: [id number] Date of Birth: 03-22-1956

## 2016-08-24 NOTE — Patient Instructions (Signed)
OT TREATMENT     Neuro muscular re-education:  Pt. Worked on grasping and manipulating long nosed tweezers, and placing small pegs upright  onto a small pegboard. Pt. worked with bilateral hands to complete. Pt. worked on translatory movements of the hand moving objects within the hand. Pt. worked on moving objects while alternating with the 4th, and 5th digits.   Therapeutic Exercise:  Pt. worked on the [employer] for 8 min. With constant monitoring of the BUEs. Pt. worked on changing, and alternating forward reverse position every 2 min. Rest breaks were required. Pt. performed gross gripping with grip strengthener. Pt. worked on sustaining grip while grasping pegs and reaching at various heights. Gripper was placed in the 3rd resistive slot with the white resistive spring.  Pt. worked on the digiflex 1.5#. Pt. Had difficulty separating digits.  Selfcare:  Manual Therapy:

## 2016-08-24 NOTE — Therapy (Signed)
[place] MAIN [facility] SERVICES [address] [place], [place], [address] Phone: [phone]   Fax:  [phone]  Physical Therapy Treatment  Patient Details  Name: Marc Blake MRN: [id number] Date of Birth: [birth year] Referring Provider: [name]  Encounter Date: 08/24/2016      PT End of Session - 08/24/16 1623    Visit Number 1   Number of Visits 16   Authorization Type 1/10 G code   PT Start Time 7124   PT Stop Time 1610   PT Time Calculation (min) 55 min   Equipment Utilized During Treatment Gait belt   Activity Tolerance Patient tolerated treatment well;No increased pain   Behavior During Therapy WFL for tasks assessed/performed      Past Medical History:  Diagnosis Date  . Acid reflux   . Anxiety   . Arthritis   . CHF (congestive heart failure) ([place])   . Depression   . Depression   . Hyperlipidemia   . Hypertension   . Myocardial infarction   . Renal cancer ([place])   . Renal cell carcinoma ([place])   . Skin cancer     Past Surgical History:  Procedure Laterality Date  . BACK SURGERY    . CARDIAC CATHETERIZATION     with stent  . CYSTOSCOPY WITH BIOPSY  03/06/2016   Procedure: CYSTOSCOPY WITH BIOPSY;  Surgeon: [name], MD;  Location: [facility] ORS;  Service: Urology;;  . [name] WITH URETHRAL DILATATION N/A 03/06/2016   Procedure: CYSTOSCOPY WITH URETHRAL DILATATION WITH CATHETER PLACEMENT;  Surgeon: [name], MD;  Location: [facility] ORS;  Service: Urology;  Laterality: N/A;  . KNEE SURGERY Left   . LEG SURGERY Left   . TONSILLECTOMY      There were no vitals filed for this visit.      Subjective Assessment - 08/24/16 1527    Subjective Patient reports he's having balance and walking difficulties. Patient states he has difficulties with walking and standing balance. Patient also reports he has to sit down in the wheelchair to prepare meals, patient reports he could stand for 55mn, increased difficulty  with performing sit to stands, and is unable to raise arms overhead when standing.    Pertinent History Patient presents with history of bone CA with a femur replacement and hip replacement, Patient reports he continues to have CA in his kidneys and lungs, prosthetic rib secondary to    Limitations Walking;Standing;Lifting   How long can you stand comfortably? 170m   How long can you walk comfortably? 100042f Patient Stated Goals Patient states he wants to walk without the walker   Currently in Pain? No/denies            [facility] Assessment - 08/24/16 1524      Assessment   Medical Diagnosis CVA   Referring Provider Dr. LinNetty StarringOnset Date/Surgical Date 06/16/17   Hand Dominance Right   Next MD Visit unknown   Prior Therapy Yes     Precautions   Precautions Posterior Hip     Restrictions   Weight Bearing Restrictions Yes   LLE Weight Bearing Weight bearing as tolerated     Balance Screen   Has the patient fallen in the past 6 months --     [place]idence   Living Arrangements Spouse/significant other   Available Help at Discharge Family   Type of Hom[place] Clem[place]cess Level entry  Home Layout One level   Furniture conservator/restorer - 4 wheels;Cane - quad     Prior Function   Level of Independence Independent with household mobility with device   Vocation Full time employment   Scientist, forensic in/out of a truck, driving   Leisure TV     Cognition   Overall Cognitive Status Within Functional Limits for tasks assessed     ROM / Strength   AROM / PROM / Strength Strength     Strength   Strength Assessment Site Hip;Knee;Ankle   Right/Left Hip Right;Left   Right Hip Flexion 3+/5   Right Hip ABduction 3/5   Right Hip ADduction 3-/5   Left Hip Flexion 2/5   Left Hip ABduction 2+/5   Left Hip ADduction 2+/5   Right/Left Knee Right;Left   Right Knee Flexion 3+/5   Right Knee Extension  4-/5   Left Knee Flexion 2+/5   Left Knee Extension 3-/5   Right/Left Ankle Right;Left   Right Ankle Dorsiflexion 4-/5   Left Ankle Dorsiflexion 3/5     Transfers   Five time sit to stand comments  18.2 with use of hands      Ambulation/Gait   Ambulation/Gait Yes   Ambulation/Gait Assistance 5: Supervision   Ambulation Distance (Feet) 810 Feet   Assistive device Rolling walker   Gait Pattern Decreased arm swing - right;Decreased arm swing - left;Decreased step length - left;Decreased step length - right;Decreased weight shift to left;Decreased stance time - left;Step-through pattern;Decreased stride length   Ambulation Surface Level   Gait Comments Patient volts over L LE to perform ambulation activities      6 minute walk test results    Aerobic Endurance Distance Walked 810     Balance   Balance Assessed Yes     Static Standing Balance   Static Standing - Balance Support No upper extremity supported  EC able to balance for 17sec     Standardized Balance Assessment   Standardized Balance Assessment Timed Up and Go Test;Five Times Sit to Stand;10 meter walk test   Five times sit to stand comments  18.2   10 Meter Walk .833 m/s     Timed Up and Go Test   Normal TUG (seconds) 20   TUG Comments Use of 2 wheeled walker to perform     Observation: Amb without AD for 90f with supervision assist    TREATMENT: Sit to stands - x10 Walking with cueing on speed and step length - 8122f      PT Education - 08/24/16 1623    Education provided Yes   Education Details HEP: sit to stands    Person(s) Educated Patient   Methods Explanation;Demonstration   Comprehension Verbalized understanding;Returned demonstration             PT Long Term Goals - 08/24/16 1642      PT LONG TERM GOAL #1   Title Patient will improve 5xSTS score to under 16 to demonstrate significant improvement in LE strength and to more easily transfer from sitting to standing   Baseline 5xSTS:  18.2sec   Time 10   Period Weeks   Status New     PT LONG TERM GOAL #2   Title Patient will improve TUG score to under 10sec to demonstrate significant improvement in fall risk and self managing hallways when at home   Baseline TUG: 20 sec   Time 10   Period Weeks   Status New  PT LONG TERM GOAL #3   Title Patient will decrease 54mnwt to >10035fto decrease 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   Baseline Dependent with form/technique   Time 10   Period Weeks   Status New               Plan - 08/2016/09/422    Clinical Impression Statement Pt is a [age] right hand dominant male who presents with difficulties with walking/standing, increased fall risk, difficulties with balancing and decreased strength in bilateral LEs. Patient's difficulties are indicated by difficulty with sharpened Rhomberg test with EC, decreased 5xSTS, decreased 18m51mk test, decreased 6min75m and decreased TUG scores. Patient will benefit from further skilled therapy focused on improving LE strength/balance and will benefit from further skilled therapy to return to prior level of function.    Rehab Potential Fair   Clinical Impairments Affecting Rehab Potential (+) highly motivated,  family support (-) history of CA   PT Frequency 2x / week   PT Duration 12 weeks   PT Treatment/Interventions ADLs/Self Care Home Management;Aquatic Therapy;Iontophoresis '4mg'$ /ml Dexamethasone;Moist Heat;Therapeutic exercise;Therapeutic activities;Stair training;Gait training;Neuromuscular re-education;Balance training;Patient/family education;Manual techniques   PT Next Visit Plan Ambulation with and without cane, progress strengthening and balance exercises   Consulted and Agree with Plan of Care Patient       Patient will benefit from skilled therapeutic intervention in order to improve the following deficits and impairments:  Abnormal gait, Increased fascial restricitons, Improper body mechanics, Impaired sensation, Increased muscle spasms, Postural dysfunction, Decreased mobility, Decreased coordination, Decreased strength, Decreased endurance, Difficulty walking, Decreased balance, Impaired flexibility  Visit Diagnosis: Difficulty in walking, not elsewhere classified  Muscle weakness (generalized)  Other lack of coordination       G-Codes - 08/802-Apr-2018    Functional Assessment Tool Used (Outpatient Only) 5xSTS, 18mWT80minWT73mUG, CLinical judgement   Functional Limitation Mobility: Walking and moving around   Mobility: Walking and Moving Around Current Status (G8978)([id number] 40 percent but less than 60 percent impaired, limited or restricted   Mobility: Walking and Moving Around Goal Status (G8979)[phone] 20 percent but less than 40 percent impaired, limited or restricted      Problem List Patient Active Problem List   Diagnosis Date Noted  . Arthralgia 08/15/2016  . Arthritis 08/01/2016  . Anemia 08/01/2016  . Elevated TSH 03/10/2016  . Mild eczema 02/27/2016  . Dyspnea on exertion 02/14/2016  . Elevated troponin 02/14/2016  . Fracture of right lower extremity 02/03/2016  . H/O resection of rib 02/03/2016  . Enlarged prostate without urinary obstruction 02/01/2016  . Urethral stricture 02/01/2016  . Personal history of deep vein thrombosis 02/01/2016  . CAD (coronary artery disease) 01/19/2016  . Mixed hyperlipidemia 01/19/2016  . Shortness of breath 01/19/2016  . Hydronephrosis 01/10/2016  . Incomplete bladder emptying 01/10/2016  . Chronic pain due to neoplasm 12/13/2015  . Essential hypertension 12/13/2015  . Major depressive disorder with single episode, in partial remission (HCC) 06[place]/2017  . Deep venous thrombosis of left popliteal vein (HCC) 12[place]/2016   . Renal cell carcinoma (HCC) 08[place]/2015  . Bone metastasis (HCC) 08[place]/2015  . Lung metastases (HCC) 08[place]/2015    Raywick Blythe StanfordT 3/8/20104-Apr-2018  5:03 PM  [place] MAIN [facility] SERVICES [address] [place], [place], [address] Phone: [phone]   Fax:  [phone]  Name: EAVEN SCHWAGER MRN: [id number] Date of Birth: 11-Apr-1956

## 2016-08-29 ENCOUNTER — Ambulatory Visit: Payer: 59

## 2016-08-29 ENCOUNTER — Ambulatory Visit: Payer: 59 | Admitting: Occupational Therapy

## 2016-08-29 DIAGNOSIS — M6281 Muscle weakness (generalized): Secondary | ICD-10-CM

## 2016-08-29 DIAGNOSIS — R262 Difficulty in walking, not elsewhere classified: Secondary | ICD-10-CM

## 2016-08-29 DIAGNOSIS — R278 Other lack of coordination: Secondary | ICD-10-CM

## 2016-08-29 NOTE — Therapy (Signed)
[place] MAIN [facility] SERVICES [address] [place], [place], [address] Phone: [phone]   Fax:  [phone]  Occupational Therapy Treatment  Patient Details  Name: Marc Blake MRN: [id number] Date of Birth: 24-May-1956 Referring Provider: Richarda Blake  Encounter Date: 08/29/2016      OT End of Session - 08/29/16 1657    Visit Number 7   Number of Visits 24   Date for OT Re-Evaluation 11/01/16   Authorization Type 20 visits   OT Start Time 1515   OT Stop Time 1600   OT Time Calculation (min) 45 min   Activity Tolerance Patient tolerated treatment well      Past Medical History:  Diagnosis Date  . Acid reflux   . Anxiety   . Arthritis   . CHF (congestive heart failure) ([place])   . Depression   . Depression   . Hyperlipidemia   . Hypertension   . Myocardial infarction   . Renal cancer ([place])   . Renal cell carcinoma ([place])   . Skin cancer     Past Surgical History:  Procedure Laterality Date  . BACK SURGERY    . CARDIAC CATHETERIZATION     with stent  . CYSTOSCOPY WITH BIOPSY  03/06/2016   Procedure: CYSTOSCOPY WITH BIOPSY;  Surgeon: [name], MD;  Location: [facility] ORS;  Service: Urology;;  . [name] WITH URETHRAL DILATATION N/A 03/06/2016   Procedure: CYSTOSCOPY WITH URETHRAL DILATATION WITH CATHETER PLACEMENT;  Surgeon: [name], MD;  Location: [facility] ORS;  Service: Urology;  Laterality: N/A;  . KNEE SURGERY Left   . LEG SURGERY Left   . TONSILLECTOMY      There were no vitals filed for this visit.      Subjective Assessment - 08/29/16 1541    Subjective  Pt. reports not feeling well over the weekend.   Pertinent History Pt. is a 61 y.o. year-old male who was admitted to [facility] for a Radical Resection of Tumor, Left Femur Resection, and Hemiarthroplasty, secondary to a pathological Fracture with IM Nailing Repair in August of 2017.  Pt. Orthopedic Physician is Dr. Nydia Blake with [facility]. The patient reports that he  was diagnosed with metastatic RCC in January of 2016; he required a large oncologic resection of the kidney leaving him with BLE paralysis.   Patient Stated Goals To be able to walk again, and improve balance.   Currently in Pain? No/denies   Pain Score 6    Pain Location Back   Pain Orientation Lower   Pain Descriptors / Indicators Squeezing                      OT Treatments/Exercises (OP) - 08/29/16 1657      Neurological Re-education Exercises   Other Exercises 1 Pt. worked on the [employer] for 8 min.  on level 3.5 with constant monitoring of the BUEs. Pt. Worked on changing, and alternating forward reverse position every 2 min. Rest breaks were required. 3# dumbbell ex. for elbow flexion and extension, forearm supination/pronation, 2# for wrist flexion/extension, and radial deviation. Pt. requires rest breaks and verbal cues for proper technique. Pt. Performed BUE gross gripping with grip strengthener. Pt. worked on sustaining grip while grasping pegs and reaching at various heights. Gripper was placed in the 3rd resistive slot with the white resistive spring. Pt. Worked on digiflex1.5 with bilateral hands. With alternating 1& 2 digits at a time.  OT Long Term Goals - 08/09/16 1805      OT LONG TERM GOAL #1   Title Pt. will improve standing tolerance to be able to complete a standing ADL/IADL task for 11 min. safely with minimal rest breaks.   Baseline limited standing tolerance for ADLs.   Time 12   Period Weeks   Status New     OT LONG TERM GOAL #2   Title Pt. will demonstrate good dynamic standing balance during ADLs/IADLs    Baseline Limited   Time 12   Period Weeks   Status New     OT LONG TERM GOAL #3   Title Pt. will demonstrate energy conservation/work simplification techniques 100% of the time during ADL/IADL tasks.   Baseline Limited knowledge of energy conservation/work simplififcation techniques   Time 12   Period Weeks    Status New     OT LONG TERM GOAL #4   Title Pt. will Improve UE strength by 2 muscle grades to assist with aADL/IADLs.   Baseline Limited UE strength   Time 12   Period Weeks   Status New     OT LONG TERM GOAL #5   Title Pt. will improve [facility] skills to be able to independently write legibly, and efficiently   Baseline limited legibility   Time 12   Period Weeks   Status New     Long Term Additional Goals   Additional Long Term Goals Yes               Plan - 08/29/16 1658    Clinical Impression Statement Pt. reports that he did not feel well this past weekend, and had a bad weekend. Pt. continues to present with UE weakness, and limited coordination skills however is improving oveall. Pt. continues to benefit from skilled OT services to improve ADL, and IADL functioning.   Rehab Potential Good   Clinical Impairments Affecting Rehab Potential Postive indicators: age, motivation, family support. Negative Indicators; Multiple comorbidities.   OT Frequency 2x / week   OT Duration 12 weeks   OT Treatment/Interventions Self-care/ADL training;Therapeutic exercise;Patient/family education;Manual Therapy;Neuromuscular education;Therapeutic exercises;Therapeutic activities;Energy conservation;DME and/or AE instruction;Moist Heat   Consulted and Agree with Plan of Care Patient      Patient will benefit from skilled therapeutic intervention in order to improve the following deficits and impairments:  Decreased balance, Decreased strength, Decreased mobility, Impaired UE functional use, Decreased knowledge of precautions, Decreased range of motion, Decreased coordination, Decreased endurance, Impaired flexibility, Difficulty walking, Pain, Decreased activity tolerance, Decreased safety awareness  Visit Diagnosis: Muscle weakness (generalized)  Other lack of coordination    Problem List Patient Active Problem List   Diagnosis Date Noted  . Arthralgia 08/15/2016  . Arthritis  08/01/2016  . Anemia 08/01/2016  . Elevated TSH 03/10/2016  . Mild eczema 02/27/2016  . Dyspnea on exertion 02/14/2016  . Elevated troponin 02/14/2016  . Fracture of right lower extremity 02/03/2016  . H/O resection of rib 02/03/2016  . Enlarged prostate without urinary obstruction 02/01/2016  . Urethral stricture 02/01/2016  . Personal history of deep vein thrombosis 02/01/2016  . CAD (coronary artery disease) 01/19/2016  . Mixed hyperlipidemia 01/19/2016  . Shortness of breath 01/19/2016  . Hydronephrosis 01/10/2016  . Incomplete bladder emptying 01/10/2016  . Chronic pain due to neoplasm 12/13/2015  . Essential hypertension 12/13/2015  . Major depressive disorder with single episode, in partial remission ([place]) 12/13/2015  . Deep venous thrombosis of left popliteal vein ([place]) 06/07/2015  . Renal  cell carcinoma ([place]) 01/21/2014  . Bone metastasis ([place]) 01/21/2014  . Lung metastases ([place]) 01/21/2014    Harrel Carina, MS, OTR/L 08/29/2016, 5:03 PM  [place] MAIN [facility] SERVICES [address] [place], [place], [address] Phone: [phone]   Fax:  [phone]  Name: RAYMAR JOINER MRN: [id number] Date of Birth: Oct 22, 1955

## 2016-08-29 NOTE — Patient Instructions (Signed)
OT TREATMENT     Neuro muscular re-education:  Therapeutic Exercise:  Pt. worked on the [employer] for 8 min.  on level 3.5 with constant monitoring of the BUEs. Pt. Worked on changing, and alternating forward reverse position every 2 min. Rest breaks were required. 3# dumbbell ex. for elbow flexion and extension, forearm supination/pronation, 2# for wrist flexion/extension, and radial deviation. Pt. requires rest breaks and verbal cues for proper technique. Pt. Performed BUE gross gripping with grip strengthener. Pt. worked on sustaining grip while grasping pegs and reaching at various heights. Gripper was placed in the 3rd resistive slot with the white resistive spring. Pt. Worked on digiflex1.5 with bilateral hands. With alternating 1& 2 digits at a time.  Selfcare:  Manual Therapy:

## 2016-08-29 NOTE — Therapy (Signed)
[place] MAIN [facility] SERVICES [address] [place], [place], [address] Phone: [phone]   Fax:  [phone]  Physical Therapy Treatment  Patient Details  Name: Marc Blake MRN: [id number] Date of Birth: [birth year] Referring Provider: [name]  Encounter Date: 08/29/2016      PT End of Session - 08/29/16 1656    Visit Number 2   Number of Visits 16   Authorization Type 2/10 G code   PT Start Time 1601   PT Stop Time 8127   PT Time Calculation (min) 46 min   Equipment Utilized During Treatment Gait belt   Activity Tolerance Patient tolerated treatment well;No increased pain   Behavior During Therapy WFL for tasks assessed/performed      Past Medical History:  Diagnosis Date  . Acid reflux   . Anxiety   . Arthritis   . CHF (congestive heart failure) ([place])   . Depression   . Depression   . Hyperlipidemia   . Hypertension   . Myocardial infarction   . Renal cancer ([place])   . Renal cell carcinoma ([place])   . Skin cancer     Past Surgical History:  Procedure Laterality Date  . BACK SURGERY    . CARDIAC CATHETERIZATION     with stent  . CYSTOSCOPY WITH BIOPSY  03/06/2016   Procedure: CYSTOSCOPY WITH BIOPSY;  Surgeon: [name], MD;  Location: [facility] ORS;  Service: Urology;;  . [name] WITH URETHRAL DILATATION N/A 03/06/2016   Procedure: CYSTOSCOPY WITH URETHRAL DILATATION WITH CATHETER PLACEMENT;  Surgeon: [name], MD;  Location: [facility] ORS;  Service: Urology;  Laterality: N/A;  . KNEE SURGERY Left   . LEG SURGERY Left   . TONSILLECTOMY      There were no vitals filed for this visit.      Subjective Assessment - 08/29/16 1626    Subjective Patient reports he did not have a good weekend and states he felt very ill.   Pertinent History Patient presents with history of bone CA with a femur replacement and hip replacement, Patient reports he continues to have CA in his kidneys and lungs, prosthetic rib  secondary to    Limitations Walking;Standing;Lifting   How long can you stand comfortably? 24mn   How long can you walk comfortably? 10076f  Patient Stated Goals Patient states he wants to walk without the walker   Currently in Pain? Yes   Pain Score 5    Pain Location Back   Pain Orientation Lower   Pain Descriptors / Indicators Squeezing;Aching   Pain Type Chronic pain   Pain Frequency Constant      TREATMENT: Nustep Seated position 14 - resistance level 2;  45m38mMarches in sitting with GTB around knees - 2 x 20; x10 on the L side  Seated hip abduction with GTB around knee - 2 x 20  Hip adduction with 4 # ball - x 25 with glute avct Leg Press at MATRIX - x20 90# with cueing to activate hip adductors on the left side  Stairs ascending/descending x 4 -- 4 steps with B UE support step over step  Ambulation with focus on heel strike and increasing step length - 2 x 104f62f    PT Education - 08/29/16 1655    Education provided Yes   Education Details Form/technique with exercise   Person(s) Educated Patient   Methods Explanation;Demonstration   Comprehension Verbalized understanding;Returned demonstration  PT Long Term Goals - 08/24/16 1642      PT LONG TERM GOAL #1   Title Patient will improve 5xSTS score to under 16 to demonstrate significant improvement in LE strength and to more easily transfer from sitting to standing   Baseline 5xSTS: 18.2sec   Time 10   Period Weeks   Status New     PT LONG TERM GOAL #2   Title Patient will improve TUG score to under 10sec to demonstrate significant improvement in fall risk and self managing hallways when at home   Baseline TUG: 20 sec   Time 10   Period Weeks   Status New     PT LONG TERM GOAL #3   Title Patient will decrease 79mnwt to >10072fto 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   Baseline Dependent with form/technique   Time 10   Period Weeks   Status New               Plan - 08/29/16 1657    Clinical Impression Statement Pt demonstrates decreased LE strength most noteably on the L LE; instructed pt on proper technique with lower extremity positioning with ambulating. Patient demonstrates increased postural sway with standing exercises and will benefit from further skilled therapy focused on improving functional capacity to return to prior level of function.    Rehab Potential Fair   Clinical Impairments Affecting Rehab Potential (+) highly motivated,  family support (-) history of CA   PT Frequency 2x / week   PT Duration 12 weeks   PT Treatment/Interventions ADLs/Self Care Home Management;Aquatic Therapy;Iontophoresis '4mg'$ /ml Dexamethasone;Moist Heat;Therapeutic exercise;Therapeutic activities;Stair training;Gait training;Neuromuscular re-education;Balance training;Patient/family education;Manual techniques   PT Next Visit Plan Ambulation with and without cane, progress strengthening and balance exercises   Consulted and Agree with Plan of Care Patient      Patient will benefit from skilled therapeutic intervention in order to improve the following deficits and impairments:  Abnormal gait, Increased fascial restricitons, Improper body mechanics, Impaired sensation, Increased muscle spasms, Postural dysfunction, Decreased mobility, Decreased coordination, Decreased strength, Decreased endurance, Difficulty walking, Decreased balance, Impaired flexibility  Visit Diagnosis: Muscle weakness (generalized)  Other lack of coordination  Difficulty in walking, not elsewhere classified     Problem List Patient Active Problem List   Diagnosis Date Noted  . Arthralgia 08/15/2016  . Arthritis 08/01/2016  . Anemia 08/01/2016  . Elevated TSH 03/10/2016  .  Mild eczema 02/27/2016  . Dyspnea on exertion 02/14/2016  . Elevated troponin 02/14/2016  . Fracture of right lower extremity 02/03/2016  . H/O resection of rib 02/03/2016  . Enlarged prostate without urinary obstruction 02/01/2016  . Urethral stricture 02/01/2016  . Personal history of deep vein thrombosis 02/01/2016  . CAD (coronary artery disease) 01/19/2016  . Mixed hyperlipidemia 01/19/2016  . Shortness of breath 01/19/2016  . Hydronephrosis 01/10/2016  . Incomplete bladder emptying 01/10/2016  . Chronic pain due to neoplasm 12/13/2015  . Essential hypertension 12/13/2015  . Major depressive disorder with single episode, in partial remission ([place]CC[place]6/26/2017  . Deep venous thrombosis of left popliteal vein (HCC[place]2/19/2016  . Renal cell carcinoma (HCC[place]8/10/2013  . Bone metastasis (HCCTell [place]8/10/2013  . Lung metastases (HCCBunker Hill [place]8/10/2013  Blythe Stanford, PT DPT 08/29/2016, 5:17 PM  [place] MAIN [facility] SERVICES [address] [place], [place], [address] Phone: [phone]   Fax:  [phone]  Name: Marc Blake MRN: [id number] Date of Birth: Jan 17, 1956

## 2016-08-31 ENCOUNTER — Ambulatory Visit: Payer: 59 | Admitting: Occupational Therapy

## 2016-08-31 ENCOUNTER — Ambulatory Visit: Payer: 59

## 2016-08-31 DIAGNOSIS — R278 Other lack of coordination: Secondary | ICD-10-CM

## 2016-08-31 DIAGNOSIS — M6281 Muscle weakness (generalized): Secondary | ICD-10-CM

## 2016-08-31 DIAGNOSIS — R262 Difficulty in walking, not elsewhere classified: Secondary | ICD-10-CM

## 2016-08-31 NOTE — Therapy (Signed)
[place] MAIN [facility] SERVICES [address] [place], [place], [address] Phone: [phone]   Fax:  [phone]  Physical Therapy Treatment  Patient Details  Name: Marc Blake MRN: [id number] Date of Birth: 1956/05/05 Referring Provider: [name]  Encounter Date: 08/31/2016      PT End of Session - 08/31/16 1612    Visit Number 3   Number of Visits 16   Authorization Type 3/10 G code   PT Start Time 1602   PT Stop Time 1645   PT Time Calculation (min) 43 min   Equipment Utilized During Treatment Gait belt   Activity Tolerance Patient tolerated treatment well;No increased pain   Behavior During Therapy WFL for tasks assessed/performed      Past Medical History:  Diagnosis Date  . Acid reflux   . Anxiety   . Arthritis   . CHF (congestive heart failure) ([place])   . Depression   . Depression   . Hyperlipidemia   . Hypertension   . Myocardial infarction   . Renal cancer ([place])   . Renal cell carcinoma ([place])   . Skin cancer     Past Surgical History:  Procedure Laterality Date  . BACK SURGERY    . CARDIAC CATHETERIZATION     with stent  . CYSTOSCOPY WITH BIOPSY  03/06/2016   Procedure: CYSTOSCOPY WITH BIOPSY;  Surgeon: [name], MD;  Location: [facility] ORS;  Service: Urology;;  . [name] WITH URETHRAL DILATATION N/A 03/06/2016   Procedure: CYSTOSCOPY WITH URETHRAL DILATATION WITH CATHETER PLACEMENT;  Surgeon: [name], MD;  Location: [facility] ORS;  Service: Urology;  Laterality: N/A;  . KNEE SURGERY Left   . LEG SURGERY Left   . TONSILLECTOMY      There were no vitals filed for this visit.      Subjective Assessment - 08/31/16 1610    Subjective Patient reports he is feeling pretty good and states no major changes since the previous visit.    Pertinent History Patient presents with history of bone CA with a femur replacement and hip replacement, Patient reports he continues to have CA in his kidneys and lungs,  prosthetic rib secondary to    Limitations Walking;Standing;Lifting   How long can you stand comfortably? 66mn   How long can you walk comfortably? 10063f  Patient Stated Goals Patient states he wants to walk without the walker   Currently in Pain? Yes   Pain Score 6    Pain Location Back   Pain Orientation Lower   Pain Descriptors / Indicators Aching   Pain Type Chronic pain   Pain Frequency Constant      TREATMENT: Nustep Seated position 14 - resistance level 5;  70m62mAmbulation with quad cane - 4 x 90f45fbulation without AD - 2 x 90ft86fing on weight progression  Marches in standing in  bars - 2 x 20 Wobbleboard balance lateral weight shifting - x30 Lateral weight shifting in standing - x 20 B Retrostep forwardback weight shifting - x20 B for quad activation Straight leg kicks in  bars - x 20B Leg Press at MATRINatural Steps with cueing to activate hip adductors on the left side        PT Education - 08/31/16 1611    Education provided Yes   Education Details Form/technique with exercise   Person(s) Educated Patient   Methods Explanation;Demonstration   Comprehension Verbalized understanding;Returned demonstration  PT Long Term Goals - 08/24/16 1642      PT LONG TERM GOAL #1   Title Patient will improve 5xSTS score to under 16 to demonstrate significant improvement in LE strength and to more easily transfer from sitting to standing   Baseline 5xSTS: 18.2sec   Time 10   Period Weeks   Status New     PT LONG TERM GOAL #2   Title Patient will improve TUG score to under 10sec to demonstrate significant improvement in fall risk and self managing hallways when at home   Baseline TUG: 20 sec   Time 10   Period Weeks   Status New     PT LONG TERM GOAL #3   Title Patient will decrease 71mnwt to >10028fto 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   Baseline Dependent with form/technique   Time 10   Period Weeks   Status New               Plan - 08/31/16 1631    Clinical Impression Statement Focused on improving weight shifting and weight acceptance on the R LE to allow for improved ambulation. Patient demonstrates improve ambulation after performing weight shifting exercises indicating improved weight transfer. Patient will benefit from further skilled therapy to return to prior level of function.    Rehab Potential Fair   Clinical Impairments Affecting Rehab Potential (+) highly motivated,  family support (-) history of CA   PT Frequency 2x / week   PT Duration 12 weeks   PT Treatment/Interventions ADLs/Self Care Home Management;Aquatic Therapy;Iontophoresis '4mg'$ /ml Dexamethasone;Moist Heat;Therapeutic exercise;Therapeutic activities;Stair training;Gait training;Neuromuscular re-education;Balance training;Patient/family education;Manual techniques   PT Next Visit Plan Ambulation with and without cane, progress strengthening and balance exercises   Consulted and Agree with Plan of Care Patient      Patient will benefit from skilled therapeutic intervention in order to improve the following deficits and impairments:  Abnormal gait, Increased fascial restricitons, Improper body mechanics, Impaired sensation, Increased muscle spasms, Postural dysfunction, Decreased mobility, Decreased coordination, Decreased strength, Decreased endurance, Difficulty walking, Decreased balance, Impaired flexibility  Visit Diagnosis: Muscle weakness (generalized)  Other lack of coordination  Difficulty in walking, not elsewhere classified     Problem List Patient Active Problem List   Diagnosis Date Noted  . Arthralgia 08/15/2016  . Arthritis 08/01/2016  . Anemia 08/01/2016  . Elevated TSH 03/10/2016  . Mild eczema  02/27/2016  . Dyspnea on exertion 02/14/2016  . Elevated troponin 02/14/2016  . Fracture of right lower extremity 02/03/2016  . H/O resection of rib 02/03/2016  . Enlarged prostate without urinary obstruction 02/01/2016  . Urethral stricture 02/01/2016  . Personal history of deep vein thrombosis 02/01/2016  . CAD (coronary artery disease) 01/19/2016  . Mixed hyperlipidemia 01/19/2016  . Shortness of breath 01/19/2016  . Hydronephrosis 01/10/2016  . Incomplete bladder emptying 01/10/2016  . Chronic pain due to neoplasm 12/13/2015  . Essential hypertension 12/13/2015  . Major depressive disorder with single episode, in partial remission (HCCMill [place]6/26/2017  . Deep venous thrombosis of left popliteal vein (HCCRock [place]2/19/2016  . Renal cell carcinoma (HCCMill [place]8/10/2013  . Bone metastasis (HCC[place]8/10/2013  . Lung metastases (HCCMi[place]8/10/2013    WesBlythe Stanford  PT DPT 08/31/2016, 4:44 PM  [place] MAIN [facility] SERVICES [address] [place], [place], [address] Phone: [phone]   Fax:  [phone]  Name: TATEN MERROW MRN: [id number] Date of Birth: 1955/07/17

## 2016-09-01 NOTE — Therapy (Signed)
[place] MAIN [facility] SERVICES [address] [place], [place], [address] Phone: [phone]   Fax:  [phone]  Occupational Therapy Treatment  Patient Details  Name: Marc Blake MRN: [id number] Date of Birth: 03-01-1956 Referring Provider: Richarda Overlie  Encounter Date: 08/31/2016      OT End of Session - 08/31/16 1711    Visit Number 8   Number of Visits 24   Date for OT Re-Evaluation 11/01/16   Authorization Type 20 visits   OT Start Time 1515   OT Stop Time 1600   OT Time Calculation (min) 45 min   Activity Tolerance Patient tolerated treatment well   Behavior During Therapy [facility] for tasks assessed/performed      Past Medical History:  Diagnosis Date  . Acid reflux   . Anxiety   . Arthritis   . CHF (congestive heart failure) ([place])   . Depression   . Depression   . Hyperlipidemia   . Hypertension   . Myocardial infarction   . Renal cancer ([place])   . Renal cell carcinoma ([place])   . Skin cancer     Past Surgical History:  Procedure Laterality Date  . BACK SURGERY    . CARDIAC CATHETERIZATION     with stent  . CYSTOSCOPY WITH BIOPSY  03/06/2016   Procedure: CYSTOSCOPY WITH BIOPSY;  Surgeon: [name], MD;  Location: [facility] ORS;  Service: Urology;;  . [name] WITH URETHRAL DILATATION N/A 03/06/2016   Procedure: CYSTOSCOPY WITH URETHRAL DILATATION WITH CATHETER PLACEMENT;  Surgeon: [name], MD;  Location: [facility] ORS;  Service: Urology;  Laterality: N/A;  . KNEE SURGERY Left   . LEG SURGERY Left   . TONSILLECTOMY      There were no vitals filed for this visit.      Subjective Assessment - 09/01/16 1226    Subjective  Pt. reports feeling stiff today.   Pertinent History Pt. is a 61 y.o. year-old male who was admitted to [facility] for a Radical Resection of Tumor, Left Femur Resection, and Hemiarthroplasty, secondary to a pathological Fracture with IM Nailing Repair in August of 2017.  Pt. Orthopedic Physician is Dr.  Nydia Bouton with [facility]. The patient reports that he was diagnosed with metastatic RCC in January of 2016; he required a large oncologic resection of the kidney leaving him with BLE paralysis.   Currently in Pain? No/denies   Pain Score 3    Pain Location Back   Pain Descriptors / Indicators Aching   Pain Type Chronic pain                      OT Treatments/Exercises (OP) - 09/01/16 1245      Fine Motor Coordination   Other Fine Motor Exercises Pt. worked on untying knots with bilateal hands. Pt. Worked on opening various sizes of medication bottles. Cues were required.     Neurological Re-education Exercises   Other Exercises 1 Pt. worked on the [place] for 8 min. With constant monitoring of the BUEs. Pt. Worked on changing, and alternating forward reverse position every 2 min. Rest breaks were required. Pt. performed gross gripping for BUE's with grip strengthener. Pt. worked on sustaining grip while grasping pegs and reaching at various heights. Gripper was placed in the resistive slot with the white resistive spring. Pt. Worked on pinch strengthening in the left hand for lateral, and 3pt. pinch using red, and green resistive clips. Pt. worked on placing the clips at various vertical and  horizontal angles. Tactile and verbal cues were required for eliciting the desired movement.                      OT Long Term Goals - 08/09/16 1805      OT LONG TERM GOAL #1   Title Pt. will improve standing tolerance to be able to complete a standing ADL/IADL task for 11 min. safely with minimal rest breaks.   Baseline limited standing tolerance for ADLs.   Time 12   Period Weeks   Status New     OT LONG TERM GOAL #2   Title Pt. will demonstrate good dynamic standing balance during ADLs/IADLs    Baseline Limited   Time 12   Period Weeks   Status New     OT LONG TERM GOAL #3   Title Pt. will demonstrate energy conservation/work simplification techniques 100% of the time  during ADL/IADL tasks.   Baseline Limited knowledge of energy conservation/work simplififcation techniques   Time 12   Period Weeks   Status New     OT LONG TERM GOAL #4   Title Pt. will Improve UE strength by 2 muscle grades to assist with aADL/IADLs.   Baseline Limited UE strength   Time 12   Period Weeks   Status New     OT LONG TERM GOAL #5   Title Pt. will improve [facility] skills to be able to independently write legibly, and efficiently   Baseline limited legibility   Time 12   Period Weeks   Status New     Long Term Additional Goals   Additional Long Term Goals Yes               Plan - 09/01/16 1228    Clinical Impression Statement Pt. reports feeling stiffer today. Pt. has back pain with activity at times. Pt. has an area of localized swelling proximal to the left volar wrist. No pain upon palpitation.  Pt. continues to work on improving UE strength, and coordination skills for ADLs, and IADLs.    Rehab Potential Good   Clinical Impairments Affecting Rehab Potential Postive indicators: age, motivation, family support. Negative Indicators; Multiple comorbidities.   OT Frequency 2x / week   OT Duration 12 weeks   OT Treatment/Interventions Self-care/ADL training;Therapeutic exercise;Patient/family education;Manual Therapy;Neuromuscular education;Therapeutic exercises;Therapeutic activities;Energy conservation;DME and/or AE instruction;Moist Heat   Consulted and Agree with Plan of Care Patient      Patient will benefit from skilled therapeutic intervention in order to improve the following deficits and impairments:  Decreased balance, Decreased strength, Decreased mobility, Impaired UE functional use, Decreased knowledge of precautions, Decreased range of motion, Decreased coordination, Decreased endurance, Impaired flexibility, Difficulty walking, Pain, Decreased activity tolerance, Decreased safety awareness  Visit Diagnosis: Muscle weakness (generalized)  Other lack  of coordination    Problem List Patient Active Problem List   Diagnosis Date Noted  . Arthralgia 08/15/2016  . Arthritis 08/01/2016  . Anemia 08/01/2016  . Elevated TSH 03/10/2016  . Mild eczema 02/27/2016  . Dyspnea on exertion 02/14/2016  . Elevated troponin 02/14/2016  . Fracture of right lower extremity 02/03/2016  . H/O resection of rib 02/03/2016  . Enlarged prostate without urinary obstruction 02/01/2016  . Urethral stricture 02/01/2016  . Personal history of deep vein thrombosis 02/01/2016  . CAD (coronary artery disease) 01/19/2016  . Mixed hyperlipidemia 01/19/2016  . Shortness of breath 01/19/2016  . Hydronephrosis 01/10/2016  . Incomplete bladder emptying 01/10/2016  . Chronic pain  due to neoplasm 12/13/2015  . Essential hypertension 12/13/2015  . Major depressive disorder with single episode, in partial remission ([place]) 12/13/2015  . Deep venous thrombosis of left popliteal vein ([place]) 06/07/2015  . Renal cell carcinoma ([place]) 01/21/2014  . Bone metastasis ([place]) 01/21/2014  . Lung metastases ([place]) 01/21/2014    Harrel Carina, MS, OTR/L 09/01/2016, 12:48 PM  [place] MAIN [facility] SERVICES [address] [place], [place], [address] Phone: [phone]   Fax:  [phone]  Name: Marc Blake MRN: [id number] Date of Birth: December 04, 1955

## 2016-09-01 NOTE — Patient Instructions (Signed)
OT TREATMENT     Neuro muscular re-education:  Pt. worked on untying knots with bilateal hands. Pt. Worked on opening various sizes of medication bottles. Cues were required.  Therapeutic Exercise:  Pt. worked on the [employer] for 8 min. With constant monitoring of the BUEs. Pt. Worked on changing, and alternating forward reverse position every 2 min. Rest breaks were required. Pt. performed gross gripping for BUE's with grip strengthener. Pt. worked on sustaining grip while grasping pegs and reaching at various heights. Gripper was placed in the resistive slot with the white resistive spring. Pt. Worked on pinch strengthening in the left hand for lateral, and 3pt. pinch using red, and green resistive clips. Pt. worked on placing the clips at various vertical and horizontal angles. Tactile and verbal cues were required for eliciting the desired movement.   Selfcare:  Manual Therapy:

## 2016-09-05 ENCOUNTER — Inpatient Hospital Stay: Payer: 59

## 2016-09-05 ENCOUNTER — Ambulatory Visit: Payer: 59

## 2016-09-05 ENCOUNTER — Inpatient Hospital Stay (HOSPITAL_BASED_OUTPATIENT_CLINIC_OR_DEPARTMENT_OTHER): Payer: 59 | Admitting: Hematology and Oncology

## 2016-09-05 ENCOUNTER — Ambulatory Visit: Payer: 59 | Admitting: Occupational Therapy

## 2016-09-05 ENCOUNTER — Other Ambulatory Visit: Payer: Self-pay | Admitting: Hematology and Oncology

## 2016-09-05 ENCOUNTER — Encounter: Payer: Self-pay | Admitting: Hematology and Oncology

## 2016-09-05 VITALS — BP 122/75 | HR 80 | Temp 96.0°F | Resp 18 | Wt 239.6 lb

## 2016-09-05 VITALS — BP 144/84 | HR 77 | Resp 18

## 2016-09-05 DIAGNOSIS — C642 Malignant neoplasm of left kidney, except renal pelvis: Secondary | ICD-10-CM

## 2016-09-05 DIAGNOSIS — C7951 Secondary malignant neoplasm of bone: Secondary | ICD-10-CM

## 2016-09-05 DIAGNOSIS — C7800 Secondary malignant neoplasm of unspecified lung: Secondary | ICD-10-CM

## 2016-09-05 DIAGNOSIS — R918 Other nonspecific abnormal finding of lung field: Secondary | ICD-10-CM

## 2016-09-05 DIAGNOSIS — R278 Other lack of coordination: Secondary | ICD-10-CM

## 2016-09-05 DIAGNOSIS — D509 Iron deficiency anemia, unspecified: Secondary | ICD-10-CM

## 2016-09-05 DIAGNOSIS — I509 Heart failure, unspecified: Secondary | ICD-10-CM

## 2016-09-05 DIAGNOSIS — K5900 Constipation, unspecified: Secondary | ICD-10-CM

## 2016-09-05 DIAGNOSIS — Z7901 Long term (current) use of anticoagulants: Secondary | ICD-10-CM

## 2016-09-05 DIAGNOSIS — M129 Arthropathy, unspecified: Secondary | ICD-10-CM

## 2016-09-05 DIAGNOSIS — R531 Weakness: Secondary | ICD-10-CM | POA: Diagnosis not present

## 2016-09-05 DIAGNOSIS — K219 Gastro-esophageal reflux disease without esophagitis: Secondary | ICD-10-CM

## 2016-09-05 DIAGNOSIS — Z85828 Personal history of other malignant neoplasm of skin: Secondary | ICD-10-CM

## 2016-09-05 DIAGNOSIS — Z79899 Other long term (current) drug therapy: Secondary | ICD-10-CM

## 2016-09-05 DIAGNOSIS — R5383 Other fatigue: Secondary | ICD-10-CM

## 2016-09-05 DIAGNOSIS — E785 Hyperlipidemia, unspecified: Secondary | ICD-10-CM

## 2016-09-05 DIAGNOSIS — M6281 Muscle weakness (generalized): Secondary | ICD-10-CM | POA: Diagnosis not present

## 2016-09-05 DIAGNOSIS — R630 Anorexia: Secondary | ICD-10-CM

## 2016-09-05 DIAGNOSIS — C649 Malignant neoplasm of unspecified kidney, except renal pelvis: Secondary | ICD-10-CM

## 2016-09-05 DIAGNOSIS — Z923 Personal history of irradiation: Secondary | ICD-10-CM

## 2016-09-05 DIAGNOSIS — F419 Anxiety disorder, unspecified: Secondary | ICD-10-CM | POA: Diagnosis not present

## 2016-09-05 DIAGNOSIS — R262 Difficulty in walking, not elsewhere classified: Secondary | ICD-10-CM

## 2016-09-05 DIAGNOSIS — M7989 Other specified soft tissue disorders: Secondary | ICD-10-CM

## 2016-09-05 DIAGNOSIS — I10 Essential (primary) hypertension: Secondary | ICD-10-CM

## 2016-09-05 DIAGNOSIS — I252 Old myocardial infarction: Secondary | ICD-10-CM

## 2016-09-05 DIAGNOSIS — F329 Major depressive disorder, single episode, unspecified: Secondary | ICD-10-CM

## 2016-09-05 LAB — CBC WITH DIFFERENTIAL/PLATELET
Basophils Absolute: 0.1 10*3/uL (ref 0–0.1)
Basophils Relative: 1 %
Eosinophils Absolute: 0.3 10*3/uL (ref 0–0.7)
Eosinophils Relative: 4 %
HCT: 27.9 % — ABNORMAL LOW (ref 40.0–52.0)
Hemoglobin: 8.6 g/dL — ABNORMAL LOW (ref 13.0–18.0)
Lymphocytes Relative: 9 %
Lymphs Abs: 0.7 10*3/uL — ABNORMAL LOW (ref 1.0–3.6)
MCH: 22.2 pg — ABNORMAL LOW (ref 26.0–34.0)
MCHC: 30.8 g/dL — ABNORMAL LOW (ref 32.0–36.0)
MCV: 72 fL — ABNORMAL LOW (ref 80.0–100.0)
Monocytes Absolute: 0.6 10*3/uL (ref 0.2–1.0)
Monocytes Relative: 9 %
Neutro Abs: 5.6 10*3/uL (ref 1.4–6.5)
Neutrophils Relative %: 77 %
Platelets: 342 10*3/uL (ref 150–440)
RBC: 3.88 MIL/uL — ABNORMAL LOW (ref 4.40–5.90)
RDW: 20.1 % — ABNORMAL HIGH (ref 11.5–14.5)
WBC: 7.3 10*3/uL (ref 3.8–10.6)

## 2016-09-05 LAB — COMPREHENSIVE METABOLIC PANEL
ALT: 11 U/L — ABNORMAL LOW (ref 17–63)
AST: 17 U/L (ref 15–41)
Albumin: 3.3 g/dL — ABNORMAL LOW (ref 3.5–5.0)
Alkaline Phosphatase: 53 U/L (ref 38–126)
Anion gap: 8 (ref 5–15)
BUN: 18 mg/dL (ref 6–20)
CO2: 27 mmol/L (ref 22–32)
Calcium: 9.3 mg/dL (ref 8.9–10.3)
Chloride: 101 mmol/L (ref 101–111)
Creatinine, Ser: 0.87 mg/dL (ref 0.61–1.24)
GFR calc Af Amer: >60 mL/min (ref >60)
GFR calc non Af Amer: >60 mL/min (ref >60)
Glucose, Bld: 108 mg/dL — ABNORMAL HIGH (ref 65–99)
Potassium: 4.3 mmol/L (ref 3.5–5.1)
Sodium: 136 mmol/L (ref 135–145)
Total Bilirubin: 0.4 mg/dL (ref 0.3–1.2)
Total Protein: 8.2 g/dL — ABNORMAL HIGH (ref 6.5–8.1)

## 2016-09-05 LAB — TSH: TSH: 2.19 u[IU]/mL (ref 0.350–4.500)

## 2016-09-05 LAB — MAGNESIUM: Magnesium: 1.9 mg/dL (ref 1.7–2.4)

## 2016-09-05 MED ORDER — SODIUM CHLORIDE 0.9 % IV SOLN
240.0000 mg | Freq: Once | INTRAVENOUS | Status: AC
  Administered 2016-09-05: 240 mg via INTRAVENOUS
  Filled 2016-09-05: qty 24

## 2016-09-05 MED ORDER — IRON SUCROSE 20 MG/ML IV SOLN
200.0000 mg | Freq: Once | INTRAVENOUS | Status: AC
  Administered 2016-09-05: 200 mg via INTRAVENOUS
  Filled 2016-09-05: qty 10

## 2016-09-05 MED ORDER — IRON SUCROSE 20 MG/ML IV SOLN: 200.0000 mg | Freq: Once | INTRAVENOUS | Status: DC

## 2016-09-05 MED ORDER — SODIUM CHLORIDE 0.9 % IV SOLN
Freq: Once | INTRAVENOUS | Status: AC
  Administered 2016-09-05: 13:00:00 via INTRAVENOUS
  Filled 2016-09-05: qty 1000

## 2016-09-05 NOTE — Patient Instructions (Signed)
OT TREATMENT     Neuro muscular re-education:  Pt. performed [facility] skills training to improve speed and dexterity needed for ADL tasks and writing. Pt. demonstrated grasping 1 inch sticks,  inch cylindrical collars, and  inch flat washers on the [place] pegboard. Pt. performed grasping each item with her 2nd digit and thumb, and storing them in the palm. Pt. presented with difficulty storing  inch objects at a time in the palmar aspect of the hand. Pt. worked on Science writer and washers simultaneously. Pt. worked on grasping 1" resistive cubes with his 2nd digit , and thumb. Pt. worked on replacing them alternating thumb opposition to the tip of the 2nd through 5th digit, and pressing them into place with her 2nd through 5th digits.  Therapeutic Exercise:  Pt. Worked on the [employer] for 8 min. At level 3.6 With constant monitoring of the BUEs. Pt. Worked on changing, and alternating forward reverse position every 2 min. Rest breaks were required. Pt. performed gross gripping with grip strengthener. Pt. worked on sustaining grip while grasping pegs and reaching at various heights. Gripper was placed in the 3rd resistive slot with the white resistive spring. Pt. worked on this in standing with walker with no LOB when reaching. Selfcare.  Manual Therapy:

## 2016-09-05 NOTE — Progress Notes (Signed)
Patient c/o inconsistent bowel movements.  States he has 1-2 BM's a week.  States he has joint pain in his back, wrists, fingers and knees.

## 2016-09-05 NOTE — Therapy (Signed)
[place] MAIN [facility] SERVICES [address] [place], [place], [address] Phone: [phone]   Fax:  [phone]  Occupational Therapy Treatment  Patient Details  Name: ROLLAN ROGER MRN: [id number] Date of Birth: 28-Sep-1955 Referring Provider: Richarda Overlie  Encounter Date: 09/05/2016      OT End of Session - 09/05/16 1657    Visit Number 9   Number of Visits 24   Date for OT Re-Evaluation 11/01/16   Authorization Type 20 visits   OT Start Time 1600   OT Stop Time 1645   OT Time Calculation (min) 45 min   Activity Tolerance Patient tolerated treatment well   Behavior During Therapy [facility] for tasks assessed/performed      Past Medical History:  Diagnosis Date  . Acid reflux   . Anxiety   . Arthritis   . CHF (congestive heart failure) ([place])   . Depression   . Depression   . Hyperlipidemia   . Hypertension   . Myocardial infarction   . Renal cancer ([place])   . Renal cell carcinoma ([place])   . Skin cancer     Past Surgical History:  Procedure Laterality Date  . BACK SURGERY    . CARDIAC CATHETERIZATION     with stent  . CYSTOSCOPY WITH BIOPSY  03/06/2016   Procedure: CYSTOSCOPY WITH BIOPSY;  Surgeon: [name], MD;  Location: [facility] ORS;  Service: Urology;;  . [name] WITH URETHRAL DILATATION N/A 03/06/2016   Procedure: CYSTOSCOPY WITH URETHRAL DILATATION WITH CATHETER PLACEMENT;  Surgeon: [name], MD;  Location: [facility] ORS;  Service: Urology;  Laterality: N/A;  . KNEE SURGERY Left   . LEG SURGERY Left   . TONSILLECTOMY      There were no vitals filed for this visit.                    OT Treatments/Exercises (OP) - 09/05/16 1656      Fine Motor Coordination   Other Fine Motor Exercises Pt. performed [facility] skills training to improve speed and dexterity needed for ADL tasks and writing. Pt. demonstrated grasping 1 inch sticks,  inch cylindrical collars, and  inch flat washers on the Purdue pegboard. Pt.  performed grasping each item with her 2nd digit and thumb, and storing them in the palm. Pt. presented with difficulty storing  inch objects at a time in the palmar aspect of the hand. Pt. worked on Science writer and washers simultaneously. Pt. worked on grasping 1" resistive cubes with his 2nd digit , and thumb. Pt. worked on replacing them alternating thumb opposition to the tip of the 2nd through 5th digit, and pressing them into place with her 2nd through 5th digits.     Neurological Re-education Exercises   Other Exercises 1 Pt. Worked on the [employer] for 8 min. At level 3.6 With constant monitoring of the BUEs. Pt. Worked on changing, and alternating forward reverse position every 2 min. Rest breaks were required. Pt. performed gross gripping with grip strengthener. Pt. worked on sustaining grip while grasping pegs and reaching at various heights. Gripper was placed in the 3rd resistive slot with the white resistive spring. Pt. worked on this in standing with walker with no LOB when reaching.                     OT Long Term Goals - 08/09/16 1805      OT LONG TERM GOAL #1   Title Pt. will improve standing  tolerance to be able to complete a standing ADL/IADL task for 11 min. safely with minimal rest breaks.   Baseline limited standing tolerance for ADLs.   Time 12   Period Weeks   Status New     OT LONG TERM GOAL #2   Title Pt. will demonstrate good dynamic standing balance during ADLs/IADLs    Baseline Limited   Time 12   Period Weeks   Status New     OT LONG TERM GOAL #3   Title Pt. will demonstrate energy conservation/work simplification techniques 100% of the time during ADL/IADL tasks.   Baseline Limited knowledge of energy conservation/work simplififcation techniques   Time 12   Period Weeks   Status New     OT LONG TERM GOAL #4   Title Pt. will Improve UE strength by 2 muscle grades to assist with aADL/IADLs.   Baseline Limited UE strength   Time 12    Period Weeks   Status New     OT LONG TERM GOAL #5   Title Pt. will improve [facility] skills to be able to independently write legibly, and efficiently   Baseline limited legibility   Time 12   Period Weeks   Status New     Long Term Additional Goals   Additional Long Term Goals Yes               Plan - 09/05/16 1657    Clinical Impression Statement Pt. reports he had an appointment with the Oncologist today. Pt. reports it lasted nealy 5 hours. Pt. reports he needs to follow-up with a Liver specialist secondary to having Hepatis B. Pt. continues to work on improving strength, coordination skills, and standing tolerance for ADL/IADL tasks.    Rehab Potential Good   Clinical Impairments Affecting Rehab Potential Postive indicators: age, motivation, family support. Negative Indicators; Multiple comorbidities.   OT Frequency 2x / week   OT Duration 12 weeks   OT Treatment/Interventions Self-care/ADL training;Therapeutic exercise;Patient/family education;Manual Therapy;Neuromuscular education;Therapeutic exercises;Therapeutic activities;Energy conservation;DME and/or AE instruction;Moist Heat   Consulted and Agree with Plan of Care Patient      Patient will benefit from skilled therapeutic intervention in order to improve the following deficits and impairments:  Decreased balance, Decreased strength, Decreased mobility, Impaired UE functional use, Decreased knowledge of precautions, Decreased range of motion, Decreased coordination, Decreased endurance, Impaired flexibility, Difficulty walking, Pain, Decreased activity tolerance, Decreased safety awareness  Visit Diagnosis: Muscle weakness (generalized)  Other lack of coordination    Problem List Patient Active Problem List   Diagnosis Date Noted  . Arthralgia 08/15/2016  . Arthritis 08/01/2016  . Anemia 08/01/2016  . Elevated TSH 03/10/2016  . Mild eczema 02/27/2016  . Dyspnea on exertion 02/14/2016  . Elevated troponin  02/14/2016  . Fracture of right lower extremity 02/03/2016  . H/O resection of rib 02/03/2016  . Enlarged prostate without urinary obstruction 02/01/2016  . Urethral stricture 02/01/2016  . Personal history of deep vein thrombosis 02/01/2016  . CAD (coronary artery disease) 01/19/2016  . Mixed hyperlipidemia 01/19/2016  . Shortness of breath 01/19/2016  . Hydronephrosis 01/10/2016  . Incomplete bladder emptying 01/10/2016  . Chronic pain due to neoplasm 12/13/2015  . Essential hypertension 12/13/2015  . Major depressive disorder with single episode, in partial remission ([place]) 12/13/2015  . Deep venous thrombosis of left popliteal vein ([place]) 06/07/2015  . Renal cell carcinoma ([place]) 01/21/2014  . Bone metastasis ([place]) 01/21/2014  . Lung metastases ([place]) 01/21/2014    Harrel Carina,  MS, OTR/L 09/05/2016, 5:04 PM  [place] MAIN [facility] SERVICES [address] [place], [place], [address] Phone: [phone]   Fax:  [phone]  Name: CORNELUIS ALLSTON MRN: [id number] Date of Birth: [birth year]

## 2016-09-05 NOTE — Therapy (Signed)
[place] MAIN [facility] SERVICES [address] [place], [place], [address] Phone: [phone]   Fax:  [phone]  Physical Therapy Treatment  Patient Details  Name: Marc Blake MRN: [id number] Date of Birth: 1955/11/14 Referring Provider: [name]  Encounter Date: 09/05/2016      PT End of Session - 09/05/16 1537    Visit Number 4   Number of Visits 16   Authorization Type 4/10 G code   PT Start Time 6962   PT Stop Time 1600   PT Time Calculation (min) 45 min   Equipment Utilized During Treatment Gait belt   Activity Tolerance Patient tolerated treatment well;No increased pain   Behavior During Therapy WFL for tasks assessed/performed      Past Medical History:  Diagnosis Date  . Acid reflux   . Anxiety   . Arthritis   . CHF (congestive heart failure) ([place])   . Depression   . Depression   . Hyperlipidemia   . Hypertension   . Myocardial infarction   . Renal cancer ([place])   . Renal cell carcinoma ([place])   . Skin cancer     Past Surgical History:  Procedure Laterality Date  . BACK SURGERY    . CARDIAC CATHETERIZATION     with stent  . CYSTOSCOPY WITH BIOPSY  03/06/2016   Procedure: CYSTOSCOPY WITH BIOPSY;  Surgeon: [name], MD;  Location: [facility] ORS;  Service: Urology;;  . [name] WITH URETHRAL DILATATION N/A 03/06/2016   Procedure: CYSTOSCOPY WITH URETHRAL DILATATION WITH CATHETER PLACEMENT;  Surgeon: [name], MD;  Location: [facility] ORS;  Service: Urology;  Laterality: N/A;  . KNEE SURGERY Left   . LEG SURGERY Left   . TONSILLECTOMY      There were no vitals filed for this visit.      Subjective Assessment - 09/05/16 1523    Subjective Patient reports he went to the CA center today and reports they "found something in his liver". Patient reports no other complaints.    Pertinent History Patient presents with history of bone CA with a femur replacement and hip replacement, Patient reports he continues  to have CA in his kidneys and lungs, prosthetic rib secondary to    Limitations Walking;Standing;Lifting   How long can you stand comfortably? 62mn   How long can you walk comfortably? 10015f  Patient Stated Goals Patient states he wants to walk without the walker   Currently in Pain? No/denies   Pain Score 5    Pain Location Back  wrist, back and shoulder   Pain Orientation Lower   Pain Descriptors / Indicators Aching   Pain Type Chronic pain   Pain Frequency Constant        TREATMENT: Nustep Seated position 14 - resistance level 5;  9m53mAmbulation without AD - 2 x [id number] on weight progression and step length Marches in standing in  bars against RTB- 3 x 10 Feet apart balance on half foam roller - 2 x 45 min Stepping straight over 2 half foam rollers - x 5 down and back in  bars  Wobbleboard balance lateral weight shifting - x30 Retrostep forwardback weight shifting - x20 B for quad activation Leg Press at MATRIX - x20 110# with cueing to activate hip adductors on the left side         PT Education - 09/05/16 1528    Education provided Yes   Education Details form/technique with exercises  Person(s) Educated Patient   Methods Explanation;Demonstration   Comprehension Verbalized understanding;Returned demonstration             PT Long Term Goals - 08/24/16 1642      PT LONG TERM GOAL #1   Title Patient will improve 5xSTS score to under 16 to demonstrate significant improvement in LE strength and to more easily transfer from sitting to standing   Baseline 5xSTS: 18.2sec   Time 10   Period Weeks   Status New     PT LONG TERM GOAL #2   Title Patient will improve TUG score to under 10sec to demonstrate significant improvement in fall risk and self managing hallways when at home   Baseline TUG: 20 sec   Time 10   Period Weeks   Status New     PT LONG TERM GOAL #3   Title Patient will decrease 23mnwt to >10013fto 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   Baseline Dependent with form/technique   Time 10   Period Weeks   Status New               Plan - 09/05/16 1545    Clinical Impression Statement Focused on improving LE motor control and strengthening through standing exercise today to improve ambulation and standing balance. Patient demonstrates decreased strength and endurance as indicating by increased fatigue after session and patient will benefit from further skilled therapy to return to prior level of function.    Rehab Potential Fair   Clinical Impairments Affecting Rehab Potential (+) highly motivated,  family support (-) history of CA   PT Frequency 2x / week   PT Duration 12 weeks   PT Treatment/Interventions ADLs/Self Care Home Management;Aquatic Therapy;Iontophoresis '4mg'$ /ml Dexamethasone;Moist Heat;Therapeutic exercise;Therapeutic activities;Stair training;Gait training;Neuromuscular re-education;Balance training;Patient/family education;Manual techniques   PT Next Visit Plan Ambulation with and without cane, progress strengthening and balance exercises   Consulted and Agree with Plan of Care Patient      Patient will benefit from skilled therapeutic intervention in order to improve the following deficits and impairments:  Abnormal gait, Increased fascial restricitons, Improper body mechanics, Impaired sensation, Increased muscle spasms, Postural dysfunction, Decreased mobility, Decreased coordination, Decreased strength, Decreased endurance, Difficulty walking, Decreased balance, Impaired flexibility  Visit Diagnosis: Muscle weakness (generalized)  Other lack of coordination  Difficulty in walking, not elsewhere classified     Problem List Patient Active Problem List   Diagnosis  Date Noted  . Arthralgia 08/15/2016  . Arthritis 08/01/2016  . Anemia 08/01/2016  . Elevated TSH 03/10/2016  . Mild eczema 02/27/2016  . Dyspnea on exertion 02/14/2016  . Elevated troponin 02/14/2016  . Fracture of right lower extremity 02/03/2016  . H/O resection of rib 02/03/2016  . Enlarged prostate without urinary obstruction 02/01/2016  . Urethral stricture 02/01/2016  . Personal history of deep vein thrombosis 02/01/2016  . CAD (coronary artery disease) 01/19/2016  . Mixed hyperlipidemia 01/19/2016  . Shortness of breath 01/19/2016  . Hydronephrosis 01/10/2016  . Incomplete bladder emptying 01/10/2016  . Chronic pain due to neoplasm 12/13/2015  . Essential hypertension 12/13/2015  . Major depressive disorder with single episode, in partial remission ([place]/26/2017  . Deep venous thrombosis of left popliteal  vein ([employer]) 06/07/2015  . Renal cell carcinoma ([place]) 01/21/2014  . Bone metastasis ([place]) 01/21/2014  . Lung metastases ([place]) 01/21/2014    Blythe Stanford, PT DPT 09/05/2016, 4:01 PM  [place] MAIN [facility] SERVICES [address] [place], [place], [address] Phone: [phone]   Fax:  [phone]  Name: ANIS DEGIDIO MRN: [id number] Date of Birth: [birth year]

## 2016-09-05 NOTE — Progress Notes (Signed)
[facility] day:  09/05/2016  Chief Complaint: Marc Blake is a 61 y.o. male with metastatic renal cell carcinoma who is seen for assessment prior to cycle #11 Opdivo.  HPI: The patient was last seen in the medical oncology clinic by [name] on 08/08/2016.  At that time, he was fatigued and did not have an appetite. He continues to have diffuse joint pain unrelated to nivolumab. He was scheduled to see rheumatology on 08/15/2016.  His blood pressure had improved with holding his blood pressure medications.  He had iron deficiency anemia.  He was to try oral iron and if unsuccessful, begin IV iron.  He restarted Opdivo.  He saw Dr. Grayland Ormond in clinic on 08/22/2016.  At that time, he complained of swollen hands, persistent weakness and fatigue, and poor appetite.  He denied any chest pain, shortness of breath, nausea, vomiting, constipation, or diarrhea.  He received cycle #10 of Opdivo.  During the interim, he reports dark cloudy urine, resolving on its own 2 days ago.  He reports catheterizing himself at home 3-4 times per week.  He has chronic constipation relieved with senna and stool softener.  He reports shortness of breath with exertion.  He reports ongoing joint pain and extreme morning stiffness resolved with movement.  He has been seen by rheumatology.  He has tingling in his fingers affecting fine motor skills.  He also reports numbness and tingling in both feet.  His appetite is about the same and his weight is down 3 pounds in the past 2 weeks.  He has been busy back to work.   Past Medical History:  Diagnosis Date  . Acid reflux   . Anxiety   . Arthritis   . CHF (congestive heart failure) ([place])   . Depression   . Depression   . Hyperlipidemia   . Hypertension   . Myocardial infarction   . Renal cancer ([place])   . Renal cell carcinoma ([place])   . Skin cancer     Past Surgical History:  Procedure Laterality Date  . BACK  SURGERY    . CARDIAC CATHETERIZATION     with stent  . CYSTOSCOPY WITH BIOPSY  03/06/2016   Procedure: CYSTOSCOPY WITH BIOPSY;  Surgeon: [name], MD;  Location: [facility] ORS;  Service: Urology;;  . [name] WITH URETHRAL DILATATION N/A 03/06/2016   Procedure: CYSTOSCOPY WITH URETHRAL DILATATION WITH CATHETER PLACEMENT;  Surgeon: [name], MD;  Location: [facility] ORS;  Service: Urology;  Laterality: N/A;  . KNEE SURGERY Left   . LEG SURGERY Left   . TONSILLECTOMY      Family History  Problem Relation Age of Onset  . Hypertension Father   . Heart attack Father   . Stroke Sister   . Hypertension Brother   . Stroke Maternal Uncle     Social History:  reports that he has never smoked. He has never used smokeless tobacco. He reports that he does not drink alcohol or use drugs.  He is originally from [place].  He then lived in [place] for 17 years.  He has been working.  He moved to [place] with his wife.  The patient is alone today.  Allergies: No Known Allergies  Current Medications: Current Outpatient Prescriptions  Medication Sig Dispense Refill  . atorvastatin (LIPITOR) 20 MG tablet TAKE 1 TABLET ONCE A DAY (AT BEDTIME) FOR 30 DAYS  2  . busPIRone (BUSPAR) 7.5 MG tablet     .  [employer] GENTLE LAXATIVE 10 MG suppository INSERT 1 SUPPOSITORY RECTALLY EVERY 24HRS AS NEEDED FOR CONSTIPATION  0  . [facility] STOOL SOFTENER 8.6-50 MG tablet TAKE 1 TABLET BY MOUTH TWICE A DAY AS NEEDED CONSTIPATION  0  . fluocinonide cream (LIDEX) 0.05 %     . gabapentin (NEURONTIN) 300 MG capsule Take 300 mg by mouth 3 (three) times daily.     Marland Kitchen ibuprofen (ADVIL,MOTRIN) 200 MG tablet Take 200 mg by mouth every 8 (eight) hours as needed for moderate pain. 4 every 8 hours    . ipratropium-albuterol (DUONEB) 0.5-2.5 (3) MG/3ML SOLN     . morphine (MS CONTIN) 30 MG 12 hr tablet     . Nivolumab (OPDIVO IV) Inject into the vein.    Marland Kitchen oxyCODONE (OXY IR/ROXICODONE) 5 MG immediate release tablet Take 10 mg by  mouth every 6 (six) hours as needed (every 6 h prn).     . senna (SENOKOT) 8.6 MG tablet Take 2 tablets by mouth at bedtime.     . sertraline (ZOLOFT) 100 MG tablet Take 100 mg by mouth at bedtime.  1  . traZODone (DESYREL) 50 MG tablet Take 50 mg by mouth at bedtime.   3  . triamcinolone cream (KENALOG) 0.5 % Apply topically 2 (two) times daily. 30 g 1  . XARELTO 20 MG TABS tablet Take 20 mg by mouth daily.  1  . amLODipine (NORVASC) 10 MG tablet Take 10 mg by mouth daily.  3  . carvedilol (COREG) 12.5 MG tablet TAKE 1 TABLET BY MOUTH TWICE A DAY FOR 30 DAYS  3  . lisinopril (PRINIVIL,ZESTRIL) 10 MG tablet     . tenofovir (VIREAD) 300 MG tablet Take 1 tablet (300 mg total) by mouth daily. 30 tablet 11   Current Facility-Administered Medications  Medication Dose Route Frequency Provider Last Rate Last Dose  . 0.9 %  sodium chloride infusion   Intravenous Once [name], MD        Review of Systems:  GENERAL:  Feels "pretty good".  More active.  No fevers or sweats.  Weight down 3 pounds. PERFORMANCE STATUS (ECOG):  1 HEENT:  No visual changes, runny nose, sore throat, mouth sores or tenderness.  Waiting on decision to fix teeth. Lungs:  Shortness of breath on exertion.  No cough or wheezing.  No hemoptysis. Cardiac:  No chest pain, palpitations, orthopnea, or PND. GI:  Appetite is poor.  Chronic constipation.  No nausea, vomiting, diarrhea, melena or hematochezia. GU:  No urgency, frequency, dysuria, or hematuria. Self caths 3-4 x/week. Musculoskeletal:  Healing well s/p surgery.  Joint pain diffuse(see HPI).  No muscle tenderness. Extremities:  Chronic left lower extremity swelling. Skin:  Eczema on elbows and back, improve. Neuro:  Poor memory.  No headache, numbness or weakness.  Decreased feeling left leg (chronic).  Left leg feels better than right leg. Endocrine:  No diabetes, thyroid issues, hot flashes or night sweats. Psych:  Happy to be back at work.  Worn down by  joint pain and lying around.  Sleep is "so-so". Pain: Joint pain. Review of systems:  All other systems reviewed and found to be negative.  Physical Exam: Blood pressure 122/75, pulse 80, temperature (!) 96 F (35.6 C), temperature source Tympanic, resp. rate 18, weight 239 lb 9 oz (108.7 kg). GENERAL:  Well developed, well nourished, slightly fatigued appearing gentleman sitting comfortably in a chair in the exam room in no acute distress.  MENTAL STATUS:  Alert and  oriented to person, place and time. HEAD:  Long gray hair with goatee.  Normocephalic, atraumatic, face symmetric, no Cushingoid features. EYES:  Pupils equal round and reactive to light and accomodation.  No conjunctivitis or scleral icterus. ENT:  Oropharynx clear without lesion.  Poor dentition.  Tongue normal. Mucous membranes moist.  RESPIRATORY:  Clear to auscultation without rales, wheezes or rhonchi. CARDIOVASCULAR:  Regular rate and rhythm without murmur, rub or gallop.  No JVD. ABDOMEN:  Soft, non-tender, with active bowel sounds, and no hepatosplenomegaly.  No masses. SKIN:  Tattoos. Eczema on dorsal surface of forearms and lower back, markedly improved. EXTREMITIES:  No skin discoloration or tenderness.  No palpable cords. LYMPH NODES: No palpable cervical, supraclavicular, axillary or inguinal adenopathy  NEUROLOGICAL:  Stable.  Walker in room. PSYCH:  Appropriate.  . Imaging studies: 12/28/2013:  Abdominal and pelvic CT scan revealed an 8.5 x 10.2 x 9.2 cm irregularly enhancing mass of the left kidney with some internal calcifications. There were enlarged left sided renal hilar lymph nodes. There were multiple pulmonary nodules in both lower lobes (largest 1.1 cm). There was a destructive bone lesion with an irregular 8.6 x 5.1 cm soft tissue mass invasive of the spinal canal narrowing at least 50% and disturbing the pedicle and portions of the left transverse process rib and T8 vertebral body.  01/15/2014:  Chest CT  revealed innumerable pulmonary nodules, a large metastatic lesion at T8 and left eighth rib with invasion into the spinal canal and moderate canal stenosis. There was a left upper pole right kidney mass.  Head MRI was negative. 05/31/2015:  Chest, abdomen, and pelvic CT revealed slight enlargement of multiple pulmonary nodules.  The left renal mass was similar (12.2 x 9.6 cm).  The right kidney lesion was stable (lower pole lesion 2 x 1.7 cm).  Bone scan on 05/31/2015 revealed slight increased activity in the left costal vertebral junction at T7. 12/14/2015:  Chest, abdomen, and pelvic CT revealed an irregular 10.7 cm heterogeneous renal cortical mass in the upper left kidney and a heterogeneous 2.3 cm renal cortical mass in the lateral lower right kidney.  There was mild bilateral hydroureteronephrosis with bladder distention.  There was mild left para-aortic lymphadenopathy.  There were numerous (greater than 20) pulmonary metastases.  Largest nodule was 2.1 cm in the RLL. There was a partially visualized mildly expansile lytic lesion in the left proximal femur status post surgical transfixation.  Thre was a small probably loculated pleural effusion at the left eighth rib resection site.  There were no other bone abnormalities. 12/24/2015:  Bone scan revealed photopenia from prior removal of the left eighth rib. There was increased  uptake in the medial thoracic spine from T7 and T9 is felt to be due to postoperative fusion.  There was increased uptake throughout much of the left femur as well as increased uptake in the soft tissues of much of the left femur which may be due to previous surgery and radiation therapy change. 03/10/2016:  Chest CT revealed unchanged bilateral pulmonary nodules and a left renal mass. 08/08/2016:  Chest, abdomen, and pelvic CT revealed decreasing left kidney lesion, stable to increase in multifocal pulmonary nodules and a similar appearance of prominent upper abdominal lymph  nodes that were worrisome for metastatic adenopathy.     Appointment on 09/05/2016  Component Date Value Ref Range Status  . WBC 09/05/2016 7.3  3.8 - 10.6 K/uL Final  . RBC 09/05/2016 3.88* 4.40 - 5.90 MIL/uL Final  .  Hemoglobin 09/05/2016 8.6* 13.0 - 18.0 g/dL Final  . HCT 09/05/2016 27.9* 40.0 - 52.0 % Final  . MCV 09/05/2016 72.0* 80.0 - 100.0 fL Final  . MCH 09/05/2016 22.2* 26.0 - 34.0 pg Final  . MCHC 09/05/2016 30.8* 32.0 - 36.0 g/dL Final  . RDW 09/05/2016 20.1* 11.5 - 14.5 % Final  . Platelets 09/05/2016 342  150 - 440 K/uL Final  . Neutrophils Relative % 09/05/2016 77  % Final  . Neutro Abs 09/05/2016 5.6  1.4 - 6.5 K/uL Final  . Lymphocytes Relative 09/05/2016 9  % Final  . Lymphs Abs 09/05/2016 0.7* 1.0 - 3.6 K/uL Final  . Monocytes Relative 09/05/2016 9  % Final  . Monocytes Absolute 09/05/2016 0.6  0.2 - 1.0 K/uL Final  . Eosinophils Relative 09/05/2016 4  % Final  . Eosinophils Absolute 09/05/2016 0.3  0 - 0.7 K/uL Final  . Basophils Relative 09/05/2016 1  % Final  . Basophils Absolute 09/05/2016 0.1  0 - 0.1 K/uL Final  . Sodium 09/05/2016 136  135 - 145 mmol/L Final  . Potassium 09/05/2016 4.3  3.5 - 5.1 mmol/L Final  . Chloride 09/05/2016 101  101 - 111 mmol/L Final  . CO2 09/05/2016 27  22 - 32 mmol/L Final  . Glucose, Bld 09/05/2016 108* 65 - 99 mg/dL Final  . BUN 09/05/2016 18  6 - 20 mg/dL Final  . Creatinine, Ser 09/05/2016 0.87  0.61 - 1.24 mg/dL Final  . Calcium 09/05/2016 9.3  8.9 - 10.3 mg/dL Final  . Total Protein 09/05/2016 8.2* 6.5 - 8.1 g/dL Final  . Albumin 09/05/2016 3.3* 3.5 - 5.0 g/dL Final  . AST 09/05/2016 17  15 - 41 U/L Final  . ALT 09/05/2016 11* 17 - 63 U/L Final  . Alkaline Phosphatase 09/05/2016 53  38 - 126 U/L Final  . Total Bilirubin 09/05/2016 0.4  0.3 - 1.2 mg/dL Final  . GFR calc non Af Amer 09/05/2016 >60  >60 mL/min Final  . GFR calc Af Amer 09/05/2016 >60  >60 mL/min Final   Comment: (NOTE) The eGFR has been calculated  using the CKD EPI equation. This calculation has not been validated in all clinical situations. eGFR's persistently <60 mL/min signify possible Chronic Kidney Disease.   . Anion gap 09/05/2016 8  5 - 15 Final  . Magnesium 09/05/2016 1.9  1.7 - 2.4 mg/dL Final  . TSH 09/05/2016 2.190  0.350 - 4.500 uIU/mL Final  . HBV DNA SERPL PCR-ACNC 09/05/2016 HBV DNA not detected  IU/mL Final  . HBV DNA SERPL PCR-LOG IU 09/05/2016 UNABLE TO CALCULATE  log10IU/mL Final   Comment: (NOTE) Unable to calculate result since non-numeric result obtained for component test.   . Test Info: 09/05/2016 Comment   Final   Comment: (NOTE) The reportable range for this assay is 10 IU/mL to 1 billion IU/mL. Performed At: [facility] [address] [place], [place] [id number] Lindon Romp MD PI:[id number]     Assessment:  Marc Blake is a 61 y.o. male with metastatic renal cell carcinoma presenting in 12/2013.  He had a large left renal mass, rib and vertebral body involvement, and multiple pulmonary nodules.  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    He was on Votrient (pazopanib) from 02/13/2014 - 01/21/2016.  He began nivolumab on 01/28/2016.  He received 3000 cGy to T7 - T9 and associated ribs beginning 05/11/2014.  On 06/15/2014, he received  1 dose of nivolumab.  He presented with progressive paralysis from the waist down on 07/06/2014.  He underwent left posterolateral thoracotomy with left chest wall resection with T8 corpectomy and interbody fusion with donor bone on 07/06/2014.  Pathology revealed metastatic renal cell carcinoma. Post-operatively, he developed sepsis secondary to a submandibular abscess dental abscess.  He underwent tracheostomy, I&D of a right neck abscess, tooth extraction, and PEG tube placement.  Zometa has subsequently been on hold.  He developed a pathologic fracture of the left femur.  He is s/p intramedullary  nailing on 01/27/2015.  He received radiation to the left leg.  He was admitted on 04/26/2015 with aspiration pneumonia and opioid overdose. He is currently taking MSContin 15 mg BID and oxycodone 5 mg q day.  He developed a left lower extremity DVT (popliteal and soleal vein) on 06/07/2015.  Left lower extremity ultrasound on 02/07/2016 revealed no evidence of DVT.  He is on long term Xarelto.  Patient has a long standing history of an obstructive uropathy and elevated PVR.  He has undergone urethral dilatation on several occasions.  He underwent cystoscopy, balloon urethral dilatation, and bladder biopsy with Foley catheter placement on 03/06/2016.  Bladder biopsy revealed cystitis with reactive changes, negative for atypia and malignancy.  He was admitted to [facility] from 02/14/2016 - 02/16/2016 with chest pain.  Chest CT angiogram on 02/15/2016 ruled out pulmonary embolism. Imaging revealed slight progression of mediastinal left hilar adenopathy was pulmonary metastatic disease. Stress test on 02/15/2016 ruled out cardiac ischemia.  He was treated with Augmentin for presumed lower extremity cellulitis.  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  There is possible increase in change in the lytic lesion with some movement at the fracture site.  He has broken the distal locking screw.  He ambulates with a rolling walker.  Bone scan at [facility] on 06/22/2016 revealed no definite evidence of osseous metastatic disease.  There were photopenic regions overlying the left posterior eighth rib and the proximal left femur, likely representing prior surgical resection  He underwent removal of prior rod/intramedullary nail, radical resection of left femur, resection of tumor, and left hemiarthroplasty at [facility] on 06/16/2016.  Pathology of the left proximal femur revealed metastatic carcinoma compatible with renal cell carcinoma.  He is scheduled to begin physical therapy.   He is s/p 10 cycles of  nivolumab (01/28/2016 - 08/22/2016).  He tolerated his infusions well.  TSH was 6.239 (0.35-4.50) with a normal free T4 (0.79) on 02/25/2016.  TSH was 2.713 (normal) and free T4 0.79 (normal) on 05/19/2016.  Treatment was held on 05/19/2016 secondary to diarrhea and on 05/26/2016 secondary to unexplained hypotension.   Cortisol level was 7.9 with a repeat of 10.3 (normal) on 05/29/2016 at 8:26 AM.  ACTH was 39.1 (normal) at 10 AM on 05/26/2016.  He has a progressive microcytic anemia c/w iron deficiency and anemia of chronic disease.  After his surgery at [facility] in 05/2016, he required 2 units of PRBCs.  MCV was 74.  Anemia work-up on 07/31/2016 revealed a ferritin 318 (falsely elevated secondary to ESR > 140), iron saturation 12%, TIBC 217 (low), and retic 2.5%.  He began Venofer (08/22/2016).  He has restrictive lung disease secondary to his weight and prior radiation.  He has been prescribed oxygen (not received).    His psoriasis flared on nivolumab, but has improved with topical steroids.  Psoriasis was back to baseline after 2 months off nivolumab.  He continues to postpone  any dental work Elie Confer on hold).  Symptomatically, has diffuse joint pain unrelated to nivolumab.  His blood pressure is stable.   Plan: 1.  Labs today:  CBC with diff, CMP, Mg. 2.  Add on labs: hepatitis testing. 3.  Cycle #11 Opdivo (nivolumab) today. 4.  Hypotension: 122/75 improved. Continue to hold BP meds per PCP. 5.  Review rheumatology consult note from 08/15/2016 with [name].  6.  Iron deficient anemia:  Venofer today. 7.  Constipation: Continue Colace and Senokot prn 8.  Dehydration: Renal function improved. Encourage fluids.  9.  RTC in 2 weeks for MD assessment, labs (CBC with diff, CMP, Mg), and cycle #12 Opdivo.  I saw and evaluated the patient, participating in the key portions of the service and reviewing pertinent diagnostic studies and records.  I reviewed the nurse practitioner's note and  agree with the findings and the plan.  The assessment and plan were discussed with the patient.  Several questions were asked by the patient and answered.   [name], MD 09/05/2016

## 2016-09-06 ENCOUNTER — Telehealth: Payer: Self-pay | Admitting: Gastroenterology

## 2016-09-06 ENCOUNTER — Encounter: Payer: Self-pay | Admitting: Gastroenterology

## 2016-09-06 DIAGNOSIS — Z7189 Other specified counseling: Secondary | ICD-10-CM | POA: Insufficient documentation

## 2016-09-06 DIAGNOSIS — Z7185 Encounter for immunization safety counseling: Secondary | ICD-10-CM | POA: Insufficient documentation

## 2016-09-06 DIAGNOSIS — D509 Iron deficiency anemia, unspecified: Secondary | ICD-10-CM | POA: Insufficient documentation

## 2016-09-06 NOTE — Telephone Encounter (Signed)
Left voice message for patient to call and schedule appointment with GI.  For DX look at referral please

## 2016-09-07 ENCOUNTER — Ambulatory Visit: Payer: 59

## 2016-09-07 ENCOUNTER — Ambulatory Visit: Payer: 59 | Admitting: Occupational Therapy

## 2016-09-07 DIAGNOSIS — M6281 Muscle weakness (generalized): Secondary | ICD-10-CM | POA: Diagnosis not present

## 2016-09-07 DIAGNOSIS — R262 Difficulty in walking, not elsewhere classified: Secondary | ICD-10-CM

## 2016-09-07 DIAGNOSIS — R278 Other lack of coordination: Secondary | ICD-10-CM

## 2016-09-07 LAB — HEPATITIS B DNA, ULTRAQUANTITATIVE, PCR
HBV DNA SERPL PCR-ACNC: NOT DETECTED IU/mL
HBV DNA SERPL PCR-LOG IU: UNDETERMINED log10IU/mL

## 2016-09-07 NOTE — Therapy (Signed)
[place] MAIN [facility] SERVICES [address] [place], [place], [address] Phone: [phone]   Fax:  [phone]  Occupational Therapy Treatment  Patient Details  Name: Marc Blake MRN: [id number] Date of Birth: 27-Mar-1956 Referring Provider: Richarda Overlie  Encounter Date: 09/07/2016      OT End of Session - 09/07/16 1738    Visit Number 10   Number of Visits 24   Date for OT Re-Evaluation 11/01/16   Authorization Type 20 visits   OT Start Time 1535   OT Stop Time 1605   OT Time Calculation (min) 30 min   Activity Tolerance Patient tolerated treatment well   Behavior During Therapy [facility] for tasks assessed/performed      Past Medical History:  Diagnosis Date  . Acid reflux   . Anxiety   . Arthritis   . CHF (congestive heart failure) ([place])   . Depression   . Depression   . Hyperlipidemia   . Hypertension   . Myocardial infarction   . Renal cancer ([place])   . Renal cell carcinoma ([place])   . Skin cancer     Past Surgical History:  Procedure Laterality Date  . BACK SURGERY    . CARDIAC CATHETERIZATION     with stent  . CYSTOSCOPY WITH BIOPSY  03/06/2016   Procedure: CYSTOSCOPY WITH BIOPSY;  Surgeon: [name], MD;  Location: [facility] ORS;  Service: Urology;;  . [name] WITH URETHRAL DILATATION N/A 03/06/2016   Procedure: CYSTOSCOPY WITH URETHRAL DILATATION WITH CATHETER PLACEMENT;  Surgeon: [name], MD;  Location: [facility] ORS;  Service: Urology;  Laterality: N/A;  . KNEE SURGERY Left   . LEG SURGERY Left   . TONSILLECTOMY      There were no vitals filed for this visit.      Subjective Assessment - 09/07/16 1552    Subjective  Pt. reports having had an orthopedic follow-up appointment today at [facility].   Pertinent History Pt. is a 61 y.o. year-old male who [facility]ical Resection of Tumor, Left Femur Resection, and Hemiarthroplasty, secondary to a pathological Fracture with IM Nailing Repair in August  of 2017.  Pt. Orthopedic Physician is Dr. Nydia Bouton with [facility]. The patient reports that he was diagnosed with metastatic RCC in January of 2016; he required a large oncologic resection of the kidney leaving him with BLE paralysis.   Patient Stated Goals To be able to walk again, and improve balance.   Currently in Pain? No/denies   Pain Score 5    Pain Location Back   Pain Orientation Lower   Pain Type Chronic pain   Pain Score 5   Pain Location Wrist   Pain Orientation Left;Right   Pain Descriptors / Indicators Aching   Pain Type Chronic pain                      OT Treatments/Exercises (OP) - 09/07/16 0001      Neurological Re-education Exercises   Other Exercises 1 Pt. worked on the [employer] for 8 min. On level 4 with constant monitoring of the BUEs. Pt. Worked on changing, and alternating forward reverse position every 2 min. Rest breaks were required. Pt. performed 4# dowel ex. For UE strengthening secondary to weakness. Bilateral shoulder flexion, chest press, circular patterns, and elbow flexion/extension were performed.                OT Education - 09/07/16 1737    Education provided  Yes   Education Details UE ther. ex.   Person(s) Educated Patient   Methods Explanation;Demonstration   Comprehension Verbalized understanding;Returned demonstration             OT Long Term Goals - 08/09/16 1805      OT LONG TERM GOAL #1   Title Pt. will improve standing tolerance to be able to complete a standing ADL/IADL task for 11 min. safely with minimal rest breaks.   Baseline limited standing tolerance for ADLs.   Time 12   Period Weeks   Status New     OT LONG TERM GOAL #2   Title Pt. will demonstrate good dynamic standing balance during ADLs/IADLs    Baseline Limited   Time 12   Period Weeks   Status New     OT LONG TERM GOAL #3   Title Pt. will demonstrate energy conservation/work simplification techniques 100% of the time during ADL/IADL tasks.    Baseline Limited knowledge of energy conservation/work simplififcation techniques   Time 12   Period Weeks   Status New     OT LONG TERM GOAL #4   Title Pt. will Improve UE strength by 2 muscle grades to assist with aADL/IADLs.   Baseline Limited UE strength   Time 12   Period Weeks   Status New     OT LONG TERM GOAL #5   Title Pt. will improve [facility] skills to be able to independently write legibly, and efficiently   Baseline limited legibility   Time 12   Period Weeks   Status New     Long Term Additional Goals   Additional Long Term Goals Yes               Plan - 09/07/16 1739    Clinical Impression Statement Pt. was late for the appointment secondary to having an Orthopedic follow-up appointment at [facility]. Pt. continues to benefit from skilled OT services to improve UE strength, coordination, and standing tolerance for ADL tasks.    Rehab Potential Good   Clinical Impairments Affecting Rehab Potential Postive indicators: age, motivation, family support. Negative Indicators; Multiple comorbidities.   OT Frequency 2x / week   OT Duration 12 weeks   OT Treatment/Interventions Self-care/ADL training;Therapeutic exercise;Patient/family education;Manual Therapy;Neuromuscular education;Therapeutic exercises;Therapeutic activities;Energy conservation;DME and/or AE instruction;Moist Heat   Consulted and Agree with Plan of Care Patient      Patient will benefit from skilled therapeutic intervention in order to improve the following deficits and impairments:  Decreased balance, Decreased strength, Decreased mobility, Impaired UE functional use, Decreased knowledge of precautions, Decreased range of motion, Decreased coordination, Decreased endurance, Impaired flexibility, Difficulty walking, Pain, Decreased activity tolerance, Decreased safety awareness  Visit Diagnosis: Muscle weakness (generalized)  Other lack of coordination    Problem List Patient Active Problem List    Diagnosis Date Noted  . Arthralgia 08/15/2016  . Arthritis 08/01/2016  . Anemia 08/01/2016  . Elevated TSH 03/10/2016  . Mild eczema 02/27/2016  . Dyspnea on exertion 02/14/2016  . Elevated troponin 02/14/2016  . Fracture of right lower extremity 02/03/2016  . H/O resection of rib 02/03/2016  . Enlarged prostate without urinary obstruction 02/01/2016  . Urethral stricture 02/01/2016  . Personal history of deep vein thrombosis 02/01/2016  . CAD (coronary artery disease) 01/19/2016  . Mixed hyperlipidemia 01/19/2016  . Shortness of breath 01/19/2016  . Hydronephrosis 01/10/2016  . Incomplete bladder emptying 01/10/2016  . Chronic pain due to neoplasm 12/13/2015  . Essential hypertension 12/13/2015  .  Major depressive disorder with single episode, in partial remission ([place]) 12/13/2015  . Deep venous thrombosis of left popliteal vein ([place]) 06/07/2015  . Renal cell carcinoma ([place]) 01/21/2014  . Bone metastasis ([place]) 01/21/2014  . Lung metastases ([place]) 01/21/2014    Harrel Carina, MS, OTR/L 09/07/2016, 5:48 PM  [place] MAIN [facility] SERVICES [address] [place], [place], [address] Phone: [phone]   Fax:  [phone]  Name: Marc Blake MRN: [id number] Date of Birth: 08-Mar-1956

## 2016-09-07 NOTE — Therapy (Signed)
[place] MAIN [facility] SERVICES [address] [place], [place], [address] Phone: [phone]   Fax:  [phone]  Physical Therapy Treatment  Patient Details  Name: Marc Blake MRN: [id number] Date of Birth: 1955/11/24 Referring Provider: [name]  Encounter Date: 09/07/2016      PT End of Session - 09/07/16 1627    Visit Number 5   Number of Visits 16   Authorization Type 5/10 G code   PT Start Time 1600   PT Stop Time 1645   PT Time Calculation (min) 45 min   Equipment Utilized During Treatment Gait belt   Activity Tolerance Patient tolerated treatment well;No increased pain   Behavior During Therapy WFL for tasks assessed/performed      Past Medical History:  Diagnosis Date  . Acid reflux   . Anxiety   . Arthritis   . CHF (congestive heart failure) ([place])   . Depression   . Depression   . Hyperlipidemia   . Hypertension   . Myocardial infarction   . Renal cancer ([place])   . Renal cell carcinoma ([place])   . Skin cancer     Past Surgical History:  Procedure Laterality Date  . BACK SURGERY    . CARDIAC CATHETERIZATION     with stent  . CYSTOSCOPY WITH BIOPSY  03/06/2016   Procedure: CYSTOSCOPY WITH BIOPSY;  Surgeon: [name], MD;  Location: [facility] ORS;  Service: Urology;;  . [name] WITH URETHRAL DILATATION N/A 03/06/2016   Procedure: CYSTOSCOPY WITH URETHRAL DILATATION WITH CATHETER PLACEMENT;  Surgeon: [name], MD;  Location: [facility] ORS;  Service: Urology;  Laterality: N/A;  . KNEE SURGERY Left   . LEG SURGERY Left   . TONSILLECTOMY      There were no vitals filed for this visit.      Subjective Assessment - 09/07/16 1619    Subjective Patient reports no major changes since the previous visit and state he went to the doctor that performed his femur replacement surgery which he reports went well today.    Pertinent History Patient presents with history of bone CA with a femur replacement and hip  replacement, Patient reports he continues to have CA in his kidneys and lungs, prosthetic rib secondary to    Limitations Walking;Standing;Lifting   How long can you stand comfortably? 54mn   How long can you walk comfortably? 10035f  Patient Stated Goals Patient states he wants to walk without the walker   Currently in Pain? No/denies   Pain Score 6    Pain Location Back   Pain Orientation Lower   Pain Descriptors / Indicators Aching   Pain Type Chronic pain       TREATMENT: Nustep Seated position 14 - resistance level 5;  74m15mLeg Press at [employer] - 2x10 120# with cueing to activate hip adductors on the left side Step taps onto 6" step from airex pad - x 20 B Wobbleboard balance lateral weight shifting - x30 Side stepping up and over airex pads to 6" step - x 8 Marches in standing in  bars against RTB- 2 x 10       PT Education - 09/07/16 1626    Education provided Yes   Education Details Form/technique with exercise performance   Person(s) Educated Patient   Methods Explanation;Demonstration   Comprehension Verbalized understanding;Returned demonstration             PT Long Term Goals - 08/24/16 1642  PT LONG TERM GOAL #1   Title Patient will improve 5xSTS score to under 16 to demonstrate significant improvement in LE strength and to more easily transfer from sitting to standing   Baseline 5xSTS: 18.2sec   Time 10   Period Weeks   Status New     PT LONG TERM GOAL #2   Title Patient will improve TUG score to under 10sec to demonstrate significant improvement in fall risk and self managing hallways when at home   Baseline TUG: 20 sec   Time 10   Period Weeks   Status New     PT LONG TERM GOAL #3   Title Patient will decrease 40mnwt to >10029fto 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   Baseline Dependent with form/technique   Time 10   Period Weeks   Status New               Plan - 09/07/16 1650    Clinical Impression Statement Continued to focus on performing standing stabilizatoin and improving muscular endurance/strength. Patient demonstrates decreased weight shifting and SLS on L LE. Patient will benefit from further skilled therapy to return to prior level of function.    Rehab Potential Fair   Clinical Impairments Affecting Rehab Potential (+) highly motivated,  family support (-) history of CA   PT Frequency 2x / week   PT Duration 12 weeks   PT Treatment/Interventions ADLs/Self Care Home Management;Aquatic Therapy;Iontophoresis '4mg'$ /ml Dexamethasone;Moist Heat;Therapeutic exercise;Therapeutic activities;Stair training;Gait training;Neuromuscular re-education;Balance training;Patient/family education;Manual techniques   PT Next Visit Plan Ambulation with and without cane, progress strengthening and balance exercises   Consulted and Agree with Plan of Care Patient      Patient will benefit from skilled therapeutic intervention in order to improve the following deficits and impairments:  Abnormal gait, Increased fascial restricitons, Improper body mechanics, Impaired sensation, Increased muscle spasms, Postural dysfunction, Decreased mobility, Decreased coordination, Decreased strength, Decreased endurance, Difficulty walking, Decreased balance, Impaired flexibility  Visit Diagnosis: Muscle weakness (generalized)  Other lack of coordination  Difficulty in walking, not elsewhere classified     Problem List Patient Active Problem List   Diagnosis Date Noted  . Arthralgia 08/15/2016  . Arthritis 08/01/2016  . Anemia 08/01/2016  . Elevated TSH 03/10/2016  . Mild eczema 02/27/2016  . Dyspnea on exertion 02/14/2016  . Elevated troponin 02/14/2016  . Fracture of right lower extremity 02/03/2016  .  H/O resection of rib 02/03/2016  . Enlarged prostate without urinary obstruction 02/01/2016  . Urethral stricture 02/01/2016  . Personal history of deep vein thrombosis 02/01/2016  . CAD (coronary artery disease) 01/19/2016  . Mixed hyperlipidemia 01/19/2016  . Shortness of breath 01/19/2016  . Hydronephrosis 01/10/2016  . Incomplete bladder emptying 01/10/2016  . Chronic pain due to neoplasm 12/13/2015  . Essential hypertension 12/13/2015  . Major depressive disorder with single episode, in partial remission ([place]CC[place]6/26/2017  . Deep venous thrombosis of left popliteal vein (HCC[place]2/19/2016  . Renal cell carcinoma (HCC[place]8/10/2013  . Bone metastasis (HCC[place]8/10/2013  . Lung metastases (HCCPalo Pinto8/10/2013    WesBlythe StanfordT DPT 09/07/2016, 4:56 PM  [place] [facility] [address] [place]  [place] Phone: [phone]   Fax:  [phone]  Name: Marc Blake MRN: [id number] Date of Birth: 15-Aug-1955

## 2016-09-07 NOTE — Patient Instructions (Signed)
OT TREATMENT     Neuro muscular re-education:  Therapeutic Exercise:  Pt. worked on the [employer] for 8 min. On level 4 with constant monitoring of the BUEs. Pt. Worked on changing, and alternating forward reverse position every 2 min. Rest breaks were required. Pt. performed 4# dowel ex. For UE strengthening secondary to weakness. Bilateral shoulder flexion, chest press, circular patterns, and elbow flexion/extension were performed.  Selfcare:  Manual Therapy:

## 2016-09-12 ENCOUNTER — Ambulatory Visit: Payer: 59 | Admitting: Occupational Therapy

## 2016-09-12 ENCOUNTER — Ambulatory Visit: Payer: 59

## 2016-09-12 ENCOUNTER — Encounter: Payer: Self-pay | Admitting: Occupational Therapy

## 2016-09-12 DIAGNOSIS — M6281 Muscle weakness (generalized): Secondary | ICD-10-CM | POA: Diagnosis not present

## 2016-09-12 DIAGNOSIS — R278 Other lack of coordination: Secondary | ICD-10-CM

## 2016-09-12 DIAGNOSIS — R262 Difficulty in walking, not elsewhere classified: Secondary | ICD-10-CM

## 2016-09-12 NOTE — Therapy (Signed)
[place] MAIN [facility] SERVICES [address] [place], [place], [address] Phone: [phone]   Fax:  [phone]  Physical Therapy Treatment  Patient Details  Name: Marc Blake MRN: [id number] Date of Birth: Oct 18, 1955 Referring Provider: [name]  Encounter Date: 09/12/2016      PT End of Session - 09/12/16 1537    Visit Number 6   Number of Visits 16   Authorization Type 6/10 G code   PT Start Time 1520   PT Stop Time 1600   PT Time Calculation (min) 40 min   Equipment Utilized During Treatment Gait belt   Activity Tolerance Patient tolerated treatment well;No increased pain   Behavior During Therapy WFL for tasks assessed/performed      Past Medical History:  Diagnosis Date  . Acid reflux   . Anxiety   . Arthritis   . CHF (congestive heart failure) ([place])   . Depression   . Depression   . Hyperlipidemia   . Hypertension   . Myocardial infarction   . Renal cancer ([place])   . Renal cell carcinoma ([place])   . Skin cancer     Past Surgical History:  Procedure Laterality Date  . BACK SURGERY    . CARDIAC CATHETERIZATION     with stent  . CYSTOSCOPY WITH BIOPSY  03/06/2016   Procedure: CYSTOSCOPY WITH BIOPSY;  Surgeon: [name], MD;  Location: [facility] ORS;  Service: Urology;;  . [name] WITH URETHRAL DILATATION N/A 03/06/2016   Procedure: CYSTOSCOPY WITH URETHRAL DILATATION WITH CATHETER PLACEMENT;  Surgeon: [name], MD;  Location: [facility] ORS;  Service: Urology;  Laterality: N/A;  . KNEE SURGERY Left   . LEG SURGERY Left   . TONSILLECTOMY      There were no vitals filed for this visit.      Subjective Assessment - 09/12/16 1528    Subjective Patient reports he's better able to stand from lowered sitting positions and states he feels he's getting stronger   Pertinent History Patient presents with history of bone CA with a femur replacement and hip replacement, Patient reports he continues to have CA in his  kidneys and lungs, prosthetic rib secondary to    Limitations Walking;Standing;Lifting   How long can you stand comfortably? 50mn   How long can you walk comfortably? 10073f  Patient Stated Goals Patient states he wants to walk without the walker   Currently in Pain? Yes   Pain Score 5    Pain Location Back   Pain Orientation Lower   Pain Descriptors / Indicators Aching   Pain Type Chronic pain   Pain Frequency Constant      Nustep Seated position 14 - resistance level 6;  7 min Leg Press at [employer] - 2x15 120# with cueing to activate hip adductors on the left side Standing Marches in standing in  bars against GTB- 3 x 10 Forward/backward stepping over half foam rollers forward and back to improve weight acceptance onto L LE Tandem stance in  bars - 40 sec x 2 with intermittent UE support       PT Education - 09/12/16 1535    Education provided Yes   Education Details form/technique with exercise   Person(s) Educated Patient   Methods Explanation;Demonstration   Comprehension Verbalized understanding;Returned demonstration             PT Long Term Goals - 08/24/16 1642      PT LONG TERM GOAL #1   Title  Patient will improve 5xSTS score to under 16 to demonstrate significant improvement in LE strength and to more easily transfer from sitting to standing   Baseline 5xSTS: 18.2sec   Time 10   Period Weeks   Status New     PT LONG TERM GOAL #2   Title Patient will improve TUG score to under 10sec to demonstrate significant improvement in fall risk and self managing hallways when at home   Baseline TUG: 20 sec   Time 10   Period Weeks   Status New     PT LONG TERM GOAL #3   Title Patient will decrease 69mnwt to >10074fto 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   Baseline Dependent with form/technique   Time 10   Period Weeks   Status New               Plan - 09/12/16 1554    Clinical Impression Statement Continued to focus on improving weight acceptance onto the L LE and patient demonstrates improvement motor control and muscular coordination. Patient demonstrates postural sway with standing balance exercise indicating decreased balance and patient will benefit from further skilled therapy to return to prior level of function.    Rehab Potential Fair   Clinical Impairments Affecting Rehab Potential (+) highly motivated,  family support (-) history of CA   PT Frequency 2x / week   PT Duration 12 weeks   PT Treatment/Interventions ADLs/Self Care Home Management;Aquatic Therapy;Iontophoresis '4mg'$ /ml Dexamethasone;Moist Heat;Therapeutic exercise;Therapeutic activities;Stair training;Gait training;Neuromuscular re-education;Balance training;Patient/family education;Manual techniques   PT Next Visit Plan Ambulation with and without cane, progress strengthening and balance exercises   Consulted and Agree with Plan of Care Patient      Patient will benefit from skilled therapeutic intervention in order to improve the following deficits and impairments:  Abnormal gait, Increased fascial restricitons, Improper body mechanics, Impaired sensation, Increased muscle spasms, Postural dysfunction, Decreased mobility, Decreased coordination, Decreased strength, Decreased endurance, Difficulty walking, Decreased balance, Impaired flexibility  Visit Diagnosis: Muscle weakness (generalized)  Other lack of coordination  Difficulty in walking, not elsewhere classified     Problem List Patient Active Problem List   Diagnosis Date Noted  . Arthralgia 08/15/2016  . Arthritis 08/01/2016  . Anemia 08/01/2016  . Elevated TSH 03/10/2016  . Mild eczema 02/27/2016  . Dyspnea on exertion 02/14/2016  . Elevated troponin 02/14/2016  . Fracture  of right lower extremity 02/03/2016  . H/O resection of rib 02/03/2016  . Enlarged prostate without urinary obstruction 02/01/2016  . Urethral stricture 02/01/2016  . Personal history of deep vein thrombosis 02/01/2016  . CAD (coronary artery disease) 01/19/2016  . Mixed hyperlipidemia 01/19/2016  . Shortness of breath 01/19/2016  . Hydronephrosis 01/10/2016  . Incomplete bladder emptying 01/10/2016  . Chronic pain due to neoplasm 12/13/2015  . Essential hypertension 12/13/2015  . Major depressive disorder with single episode, in partial remission (HCCKeshena6/26/2017  . Deep venous thrombosis of left popliteal vein (HCCLittle Flock2/19/2016  . Renal cell carcinoma (HCCMorrisville8/10/2013  . Bone metastasis (HCCMountain [place]8/10/2013  . Lung metastases (HCCSan Carlos Park8/10/2013    WesBlythe StanfordT DPT 09/12/2016, 4:04 PM  Con[place] [facility]ICES [address]  [place], [place], [address] Phone: [phone]   Fax:  [phone]  Name: Marc Blake MRN: [id number] Date of Birth: Apr 02, 1956

## 2016-09-12 NOTE — Patient Instructions (Signed)
OT TREATMENT     Neuro muscular re-education:  Pt. worked on grasping and flipping pegs on the [employer] with vision, and with vision occluded using the right, and left hands.   Therapeutic Exercise:  Pt. Worked on the [employer] for 8 min. With constant monitoring of the BUEs. Pt. Worked on changing, and alternating forward reverse position every 2 min. Rest breaks were required.  Pt. Worked on biceps, and triceps/elbow flexion,a nd extension in standing, at the [employer] using 7.5#. Pt. With limited tolerance for standing bicep exercise. Pt. Tolerated triceps exercise in standing well.  Modified exercise to sitting for elbow flexion, and forearm supination, pronation with 5#.  Selfcare:  Manual Therapy:

## 2016-09-12 NOTE — Therapy (Signed)
[place] MAIN [facility] SERVICES [address] [place], [place], [address] Phone: [phone]   Fax:  [phone]  Occupational Therapy Treatment  Patient Details  Name: Marc Blake MRN: [id number] Date of Birth: [birth year] Referring Provider: Netty Starring  Encounter Date: 09/12/2016      OT End of Session - 09/12/16 1659    Visit Number 11   Number of Visits 24   Date for OT Re-Evaluation 11/01/16   Authorization Type 20 visits   OT Start Time 1600   OT Stop Time 1645   OT Time Calculation (min) 45 min   Activity Tolerance Patient tolerated treatment well   Behavior During Therapy [facility] for tasks assessed/performed      Past Medical History:  Diagnosis Date  . Acid reflux   . Anxiety   . Arthritis   . CHF (congestive heart failure) ([place])   . Depression   . Depression   . Hyperlipidemia   . Hypertension   . Myocardial infarction   . Renal cancer ([place])   . Renal cell carcinoma ([place])   . Skin cancer     Past Surgical History:  Procedure Laterality Date  . BACK SURGERY    . CARDIAC CATHETERIZATION     with stent  . CYSTOSCOPY WITH BIOPSY  03/06/2016   Procedure: CYSTOSCOPY WITH BIOPSY;  Surgeon: [name], MD;  Location: [facility] ORS;  Service: Urology;;  . [name] WITH URETHRAL DILATATION N/A 03/06/2016   Procedure: CYSTOSCOPY WITH URETHRAL DILATATION WITH CATHETER PLACEMENT;  Surgeon: [name], MD;  Location: [facility] ORS;  Service: Urology;  Laterality: N/A;  . KNEE SURGERY Left   . LEG SURGERY Left   . TONSILLECTOMY      There were no vitals filed for this visit.      Subjective Assessment - 09/12/16 1655    Subjective  Pt. reports having had a good weekend.    Pertinent History Pt. is a 61 y.o. year-old male who was admitted to [facility] for a Radical Resection of Tumor, Left Femur Resection, and Hemiarthroplasty, secondary to a pathological Fracture with IM Nailing Repair in August of 2017.  Pt. Orthopedic Physician  is Dr. Nydia Bouton with [facility]. The patient reports that he was diagnosed with metastatic RCC in January of 2016; he required a large oncologic resection of the kidney leaving him with BLE paralysis.   Patient Stated Goals To be able to walk again, and improve balance.   Currently in Pain? Yes                      OT Treatments/Exercises (OP) - 09/12/16 1712      Fine Motor Coordination   Other Fine Motor Exercises Pt. worked on grasping and flipping pegs on the instructo board with vision, and with vision occluded using the right, and left hands.      Neurological Re-education Exercises   Other Exercises 1 Pt. Worked on the [employer] for 8 min. With constant monitoring of the BUEs. Pt. Worked on changing, and alternating forward reverse position every 2 min. Rest breaks were required.  Pt. Worked on biceps, and triceps/elbow flexion,a nd extension in standing, at the [employer] tower using 7.5#. Pt. With limited tolerance for standing bicep exercise. Pt. Tolerated triceps exercise in standing well.  Modified exercise to sitting for elbow flexion, and forearm supination, pronation with 5#.                OT Education - 09/12/16 1658  Education provided Yes   Education Details ther. ex., [facility]   Person(s) Educated Patient   Methods Explanation;Demonstration   Comprehension Verbalized understanding;Returned demonstration             OT Long Term Goals - 08/09/16 1805      OT LONG TERM GOAL #1   Title Pt. will improve standing tolerance to be able to complete a standing ADL/IADL task for 11 min. safely with minimal rest breaks.   Baseline limited standing tolerance for ADLs.   Time 12   Period Weeks   Status New     OT LONG TERM GOAL #2   Title Pt. will demonstrate good dynamic standing balance during ADLs/IADLs    Baseline Limited   Time 12   Period Weeks   Status New     OT LONG TERM GOAL #3   Title Pt. will demonstrate energy conservation/work simplification  techniques 100% of the time during ADL/IADL tasks.   Baseline Limited knowledge of energy conservation/work simplififcation techniques   Time 12   Period Weeks   Status New     OT LONG TERM GOAL #4   Title Pt. will Improve UE strength by 2 muscle grades to assist with aADL/IADLs.   Baseline Limited UE strength   Time 12   Period Weeks   Status New     OT LONG TERM GOAL #5   Title Pt. will improve [facility] skills to be able to independently write legibly, and efficiently   Baseline limited legibility   Time 12   Period Weeks   Status New     Long Term Additional Goals   Additional Long Term Goals Yes               Plan - 09/12/16 1659    Clinical Impression Statement Pt. continues to present with impaired  coordination skills, and decreased UE strength. Pt. continues to work on improving UE strength, coordination skills, and standing tolerance for improved ADL, and IADL functioning.   Rehab Potential Good   Clinical Impairments Affecting Rehab Potential Postive indicators: age, motivation, family support. Negative Indicators; Multiple comorbidities.   OT Frequency 2x / week   OT Duration 12 weeks   Consulted and Agree with Plan of Care Patient      Patient will benefit from skilled therapeutic intervention in order to improve the following deficits and impairments:  Decreased balance, Decreased strength, Decreased mobility, Impaired UE functional use, Decreased knowledge of precautions, Decreased range of motion, Decreased coordination, Decreased endurance, Impaired flexibility, Difficulty walking, Pain, Decreased activity tolerance, Decreased safety awareness  Visit Diagnosis: Muscle weakness (generalized)  Other lack of coordination    Problem List Patient Active Problem List   Diagnosis Date Noted  . Arthralgia 08/15/2016  . Arthritis 08/01/2016  . Anemia 08/01/2016  . Elevated TSH 03/10/2016  . Mild eczema 02/27/2016  . Dyspnea on exertion 02/14/2016  .  Elevated troponin 02/14/2016  . Fracture of right lower extremity 02/03/2016  . H/O resection of rib 02/03/2016  . Enlarged prostate without urinary obstruction 02/01/2016  . Urethral stricture 02/01/2016  . Personal history of deep vein thrombosis 02/01/2016  . CAD (coronary artery disease) 01/19/2016  . Mixed hyperlipidemia 01/19/2016  . Shortness of breath 01/19/2016  . Hydronephrosis 01/10/2016  . Incomplete bladder emptying 01/10/2016  . Chronic pain due to neoplasm 12/13/2015  . Essential hypertension 12/13/2015  . Major depressive disorder with single episode, in partial remission ([place]) 12/13/2015  . Deep venous thrombosis of left  popliteal vein ([place]) 06/07/2015  . Renal cell carcinoma ([place]) 01/21/2014  . Bone metastasis ([place]) 01/21/2014  . Lung metastases ([place]) 01/21/2014    Harrel Carina, MS, OTR/L 09/12/2016, 5:14 PM  [place] MAIN [facility] SERVICES [address] [place], [place], [address] Phone: [phone]   Fax:  [phone]  Name: Marc Blake MRN: [id number] Date of Birth: 1956/02/24

## 2016-09-13 ENCOUNTER — Ambulatory Visit (INDEPENDENT_AMBULATORY_CARE_PROVIDER_SITE_OTHER): Payer: 59 | Admitting: Gastroenterology

## 2016-09-13 ENCOUNTER — Encounter: Payer: Self-pay | Admitting: Gastroenterology

## 2016-09-13 VITALS — BP 140/79 | HR 88 | Temp 98.3°F | Ht 75.0 in | Wt 239.5 lb

## 2016-09-13 DIAGNOSIS — C649 Malignant neoplasm of unspecified kidney, except renal pelvis: Secondary | ICD-10-CM

## 2016-09-13 DIAGNOSIS — B1910 Unspecified viral hepatitis B without hepatic coma: Secondary | ICD-10-CM

## 2016-09-13 MED ORDER — TENOFOVIR DISOPROXIL FUMARATE 300 MG PO TABS: 300.0000 mg | ORAL_TABLET | Freq: Every day | ORAL | 11 refills | Status: AC

## 2016-09-13 NOTE — Progress Notes (Signed)
Gastroenterology Consultation  Referring Provider:     [name], MD Primary Care Physician:  [name], MD Primary Gastroenterologist:  Dr. Allen Norris     Reason for Consultation:     History of hepatitis B        HPI:   Marc Blake is a 61 y.o. y/o male referred for consultation & management of History of hepatitis B by [name], Lucianne Muss, MD.  This patient comes in today because she is been found to have a core antibody positive with a surface antigen negative for hepatitis B. The patient has a surface antigen positive and a negative DNA on his workup. The patient also has an E antibody positive with an E antigen negative. The patient has started treatment for his metastatic renal cell carcinoma and the oncologist concerned about possible reactivation of his resolved hepatitis B. The patient denies any abdominal pain nausea vomiting fevers or chills. The patient and a recent viral load of his hepatitis B DNA done on March 20 with the results being negative. I'm now being asked to assess this patient for possible risk of reactivation despite him already being started on Nivolumab.  Past Medical History:  Diagnosis Date  . Acid reflux   . Anxiety   . Arthritis   . CHF (congestive heart failure) ([place])   . Depression   . Depression   . Hyperlipidemia   . Hypertension   . Myocardial infarction   . Renal cancer ([place])   . Renal cell carcinoma ([place])   . Skin cancer     Past Surgical History:  Procedure Laterality Date  . BACK SURGERY    . CARDIAC CATHETERIZATION     with stent  . CYSTOSCOPY WITH BIOPSY  03/06/2016   Procedure: CYSTOSCOPY WITH BIOPSY;  Surgeon: [name], MD;  Location: [facility] ORS;  Service: Urology;;  . [name] WITH URETHRAL DILATATION N/A 03/06/2016   Procedure: CYSTOSCOPY WITH URETHRAL DILATATION WITH CATHETER PLACEMENT;  Surgeon: [name], MD;  Location: [facility] ORS;  Service: Urology;  Laterality: N/A;  . KNEE SURGERY Left   . LEG  SURGERY Left   . TONSILLECTOMY      Prior to Admission medications   Medication Sig Start Date End Date Taking? Authorizing Provider  amLODipine (NORVASC) 10 MG tablet Take 10 mg by mouth daily. 07/06/16  Yes Historical Provider, MD  atorvastatin (LIPITOR) 20 MG tablet TAKE 1 TABLET ONCE A DAY (AT BEDTIME) FOR 30 DAYS 11/26/15  Yes Historical Provider, MD  busPIRone (BUSPAR) 7.5 MG tablet  07/12/16  Yes Historical Provider, MD  carvedilol (COREG) 12.5 MG tablet TAKE 1 TABLET BY MOUTH TWICE A DAY FOR 30 DAYS 10/18/15  Yes Historical Provider, MD  [employer] GENTLE LAXATIVE 10 MG suppository INSERT 1 SUPPOSITORY RECTALLY EVERY 24HRS AS NEEDED FOR CONSTIPATION 07/21/16  Yes Historical Provider, MD  [employer] STOOL SOFTENER 8.6-50 MG tablet TAKE 1 TABLET BY MOUTH TWICE A DAY AS NEEDED CONSTIPATION 07/21/16  Yes Historical Provider, MD  fluocinonide cream (LIDEX) 0.05 %  07/21/16  Yes Historical Provider, MD  gabapentin (NEURONTIN) 300 MG capsule Take 300 mg by mouth 3 (three) times daily.    Yes Historical Provider, MD  ibuprofen (ADVIL,MOTRIN) 200 MG tablet Take 200 mg by mouth every 8 (eight) hours as needed for moderate pain. 4 every 8 hours   Yes Historical Provider, MD  ipratropium-albuterol (DUONEB) 0.5-2.5 (3) MG/3ML SOLN  07/21/16  Yes Historical Provider, MD  lisinopril (PRINIVIL,ZESTRIL) 10 MG tablet  07/21/16  Yes Historical Provider, MD  morphine (MS CONTIN) 30 MG 12 hr tablet  07/21/16  Yes Historical Provider, MD  Nivolumab (OPDIVO IV) Inject into the vein.   Yes Historical Provider, MD  oxyCODONE (OXY IR/ROXICODONE) 5 MG immediate release tablet Take 10 mg by mouth every 6 (six) hours as needed (every 6 h prn).  12/20/15  Yes Historical Provider, MD  senna (SENOKOT) 8.6 MG tablet Take 2 tablets by mouth at bedtime.    Yes Historical Provider, MD  sertraline (ZOLOFT) 100 MG tablet Take 100 mg by mouth at bedtime. 11/13/15  Yes Historical Provider, MD  traZODone (DESYREL) 50 MG tablet Take 50 mg by mouth at bedtime.   11/26/15  Yes Historical Provider, MD  triamcinolone cream (KENALOG) 0.5 % Apply topically 2 (two) times daily. 04/28/16 04/28/17 Yes [name], MD  XARELTO 20 MG TABS tablet Take 20 mg by mouth daily. 11/16/15  Yes Historical Provider, MD  tenofovir (VIREAD) 300 MG tablet Take 1 tablet (300 mg total) by mouth daily. 09/13/16   Lucilla Lame, MD    Family History  Problem Relation Age of Onset  . Hypertension Father   . Heart attack Father   . Stroke Sister   . Hypertension Brother   . Stroke Maternal Uncle      Social History  Substance Use Topics  . Smoking status: Never Smoker  . Smokeless tobacco: Never Used  . 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 (No Known Allergies)    Review of Systems:    All systems reviewed and negative except where noted in HPI.   Physical Exam:  BP 140/79   Pulse 88   Temp 98.3 F (36.8 C) (Oral)   Ht '6\' 3"'$  (1.905 m)   Wt 239 lb 8 oz (108.6 kg)   BMI 29.94 kg/m  No LMP for male patient. Psych:  Alert and cooperative. Normal mood and affect. General:   Alert,  Well-developed, well-nourished, pleasant and cooperative in NAD Head:  Normocephalic and atraumatic. Eyes:  Sclera clear, no icterus.   Conjunctiva pink. Ears:  Normal auditory acuity. Nose:  No deformity, discharge, or lesions. Mouth:  No deformity or lesions,oropharynx pink & moist. Neck:  Supple; no masses or thyromegaly. Lungs:  Respirations even and unlabored.  Clear throughout to auscultation.   No wheezes, crackles, or rhonchi. No acute distress. Heart:  Regular rate and rhythm; no murmurs, clicks, rubs, or gallops. Abdomen:  Normal bowel sounds.  No bruits.  Soft, non-tender and non-distended without masses, hepatosplenomegaly or hernias noted.  No guarding or rebound tenderness.  Negative Carnett sign.   Rectal:  Deferred.  Msk:  Symmetrical without gross deformities.  Good, equal movement & strength bilaterally. Pulses:   Normal pulses noted. Extremities:  No clubbing or edema.  No cyanosis. Neurologic:  Alert and oriented x3;  grossly normal neurologically. Skin:  Intact without significant lesions or rashes.  No jaundice. Lymph Nodes:  No significant cervical adenopathy. Psych:  Alert and cooperative. Normal mood and affect.  Imaging Studies: No results found.  Assessment and Plan:   Marc SNYDERSis a 61y.o. y/o male who has a history of exposure to hepatitis B with a surface antigen negative with a surface antibody positive and a negative DNA viral load area the patient does have a core antibody and a hepatitis Be antibody positive. This is all consistent with past infection  with resolution but the concern of reactivation is very small but possible. The patient will be treated with tenofovir 300 mg a day for the duration of treatment and should continue it for a few weeks after he has stopped treatment. She has been given a prescription for the medication and explained the need for him to take it. The patient has been explained the plan and agrees with it.    Lucilla Lame, MD. Marval Regal   Note: This dictation was prepared with Dragon dictation along with smaller phrase technology. Any transcriptional errors that result from this process are unintentional.

## 2016-09-14 ENCOUNTER — Ambulatory Visit: Payer: 59

## 2016-09-14 ENCOUNTER — Ambulatory Visit: Payer: 59 | Admitting: Occupational Therapy

## 2016-09-14 ENCOUNTER — Telehealth: Payer: Self-pay | Admitting: *Deleted

## 2016-09-14 DIAGNOSIS — M6281 Muscle weakness (generalized): Secondary | ICD-10-CM

## 2016-09-14 DIAGNOSIS — R278 Other lack of coordination: Secondary | ICD-10-CM

## 2016-09-14 DIAGNOSIS — R262 Difficulty in walking, not elsewhere classified: Secondary | ICD-10-CM

## 2016-09-14 NOTE — Therapy (Signed)
[place] MAIN [facility] SERVICES [address] [place], [place], [address] Phone: [phone]   Fax:  [phone]  Physical Therapy Treatment  Patient Details  Name: Marc Blake MRN: [id number] Date of Birth: Oct 03, 1955 Referring Provider: [name]  Encounter Date: 09/14/2016      PT End of Session - 09/14/16 1616    Visit Number 7   Number of Visits 16   Authorization Type 7/10 G code   PT Start Time 1520   PT Stop Time 1600   PT Time Calculation (min) 40 min   Equipment Utilized During Treatment Gait belt   Activity Tolerance Patient tolerated treatment well;No increased pain   Behavior During Therapy WFL for tasks assessed/performed      Past Medical History:  Diagnosis Date  . Acid reflux   . Anxiety   . Arthritis   . CHF (congestive heart failure) ([place])   . Depression   . Depression   . Hyperlipidemia   . Hypertension   . Myocardial infarction   . Renal cancer ([place])   . Renal cell carcinoma ([place])   . Skin cancer     Past Surgical History:  Procedure Laterality Date  . BACK SURGERY    . CARDIAC CATHETERIZATION     with stent  . CYSTOSCOPY WITH BIOPSY  03/06/2016   Procedure: CYSTOSCOPY WITH BIOPSY;  Surgeon: [name], MD;  Location: [facility] ORS;  Service: Urology;;  . [name] WITH URETHRAL DILATATION N/A 03/06/2016   Procedure: CYSTOSCOPY WITH URETHRAL DILATATION WITH CATHETER PLACEMENT;  Surgeon: [name], MD;  Location: [facility] ORS;  Service: Urology;  Laterality: N/A;  . KNEE SURGERY Left   . LEG SURGERY Left   . TONSILLECTOMY      There were no vitals filed for this visit.      Subjective Assessment - 09/14/16 1612    Subjective Patient states he was not terribly sore after the previous visit and reports improvement in walking   Pertinent History Patient presents with history of bone CA with a femur replacement and hip replacement, Patient reports he continues to have CA in his kidneys and  lungs, prosthetic rib secondary to    Limitations Walking;Standing;Lifting   How long can you stand comfortably? 40mn   How long can you walk comfortably? 10033f  Patient Stated Goals Patient states he wants to walk without the walker   Currently in Pain? Yes   Pain Score 5    Pain Location Back   Pain Orientation Lower   Pain Descriptors / Indicators Aching   Pain Type Chronic pain   Pain Frequency Constant      TREATMENT: Ambulation without AD - 20022fith cueing on improving step length and heel strike bilaterally  Ambulation with walking sticks - 2 x 200f78fth cueing on improving speed and improving reciprocal gait  Nustep Seated position 14 - resistance level 7;  5 min; level 6: 1 min; level 5: 1 min Leg Press at [employer] - x25 120# with cueing to activate hip adductors on the left side; x 15 with L LE 60#  Standing Marches in standing in  bars against GTB- 3 x 20        PT Education - 09/14/16 1614    Education provided Yes   Education Details Cueing for gait performance; form/technique with exercises   Person(s) Educated Patient   Methods Demonstration;Explanation   Comprehension Verbalized understanding;Returned demonstration  PT Long Term Goals - 08/24/16 1642      PT LONG TERM GOAL #1   Title Patient will improve 5xSTS score to under 16 to demonstrate significant improvement in LE strength and to more easily transfer from sitting to standing   Baseline 5xSTS: 18.2sec   Time 10   Period Weeks   Status New     PT LONG TERM GOAL #2   Title Patient will improve TUG score to under 10sec to demonstrate significant improvement in fall risk and self managing hallways when at home   Baseline TUG: 20 sec   Time 10   Period Weeks   Status New     PT LONG TERM GOAL #3   Title Patient will decrease 87mnwt to >10044fto 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   Baseline Dependent with form/technique   Time 10   Period Weeks   Status New               Plan - 09/14/16 1616    Clinical Impression Statement Focused on performing greater amount of ambulation exercises as patient continues to demonstrate difficulty with walking; patient demonstrates improved amb with walking sticks requiring less cueing with reciprocal gait pattern. Patient continues to demonstrate decreased balance and LE strength and will benefit from further skilled therapy to return to prior level of function.    Rehab Potential Fair   Clinical Impairments Affecting Rehab Potential (+) highly motivated,  family support (-) history of CA   PT Frequency 2x / week   PT Duration 12 weeks   PT Treatment/Interventions ADLs/Self Care Home Management;Aquatic Therapy;Iontophoresis '4mg'$ /ml Dexamethasone;Moist Heat;Therapeutic exercise;Therapeutic activities;Stair training;Gait training;Neuromuscular re-education;Balance training;Patient/family education;Manual techniques   PT Next Visit Plan Ambulation with and without cane, progress strengthening and balance exercises   Consulted and Agree with Plan of Care Patient      Patient will benefit from skilled therapeutic intervention in order to improve the following deficits and impairments:  Abnormal gait, Increased fascial restricitons, Improper body mechanics, Impaired sensation, Increased muscle spasms, Postural dysfunction, Decreased mobility, Decreased coordination, Decreased strength, Decreased endurance, Difficulty walking, Decreased balance, Impaired flexibility  Visit Diagnosis: Muscle weakness (generalized)  Other lack of coordination  Difficulty in walking, not elsewhere classified     Problem List Patient Active Problem List   Diagnosis Date Noted  . Anemia, iron deficiency 09/06/2016  .  Vaccine counseling 09/06/2016  . Arthralgia 08/15/2016  . Arthritis 08/01/2016  . Anemia 08/01/2016  . S/P radiation > 12 weeks 06/16/2016  . Elevated TSH 03/10/2016  . Mild eczema 02/27/2016  . Dyspnea on exertion 02/14/2016  . Elevated troponin 02/14/2016  . Fracture of right lower extremity 02/03/2016  . H/O resection of rib 02/03/2016  . Enlarged prostate without urinary obstruction 02/01/2016  . Urethral stricture 02/01/2016  . Personal history of deep vein thrombosis 02/01/2016  . CAD (coronary artery disease) 01/19/2016  . Mixed hyperlipidemia 01/19/2016  . Shortness of breath 01/19/2016  . Hydronephrosis 01/10/2016  . Incomplete bladder emptying 01/10/2016  . Chronic pain due to neoplasm 12/13/2015  . Essential hypertension 12/13/2015  . Major depressive disorder with single episode, in partial remission ([place]/26/2017  . Deep venous thrombosis of left  popliteal vein ([place]) 06/07/2015  . Renal cell carcinoma ([place]) 01/21/2014  . Bone metastasis ([place]) 01/21/2014  . Lung metastases ([place]) 01/21/2014    Blythe Stanford, PT DPT 09/14/2016, 4:22 PM  [place] MAIN [facility] SERVICES [address] [place], [place], [address] Phone: [phone]   Fax:  [phone]  Name: MARGUIS MATHIESON MRN: [id number] Date of Birth: 02/11/1956

## 2016-09-14 NOTE — Telephone Encounter (Signed)
States she faxed over some papers to complete to update his status, Please fax completed forms back to her at [phone]

## 2016-09-15 ENCOUNTER — Encounter: Payer: Self-pay | Admitting: Occupational Therapy

## 2016-09-15 NOTE — Therapy (Signed)
[place] MAIN [facility] SERVICES [address] [place], [place], [address] Phone: [phone]   Fax:  [phone]  Occupational Therapy Treatment  Patient Details  Name: Marc Blake MRN: [id number] Date of Birth: 1955-11-20 Referring Provider: Richarda Overlie  Encounter Date: 09/14/2016      OT End of Session - 09/15/16 1258    Visit Number 12   Number of Visits 24   Date for OT Re-Evaluation 11/01/16   Authorization Type 20 visits   OT Start Time 1600   OT Stop Time 1645   OT Time Calculation (min) 45 min   Activity Tolerance Patient tolerated treatment well   Behavior During Therapy [facility] for tasks assessed/performed      Past Medical History:  Diagnosis Date  . Acid reflux   . Anxiety   . Arthritis   . CHF (congestive heart failure) ([place])   . Depression   . Depression   . Hyperlipidemia   . Hypertension   . Myocardial infarction   . Renal cancer ([place])   . Renal cell carcinoma ([place])   . Skin cancer     Past Surgical History:  Procedure Laterality Date  . BACK SURGERY    . CARDIAC CATHETERIZATION     with stent  . CYSTOSCOPY WITH BIOPSY  03/06/2016   Procedure: CYSTOSCOPY WITH BIOPSY;  Surgeon: [name], MD;  Location: [facility] ORS;  Service: Urology;;  . [name] WITH URETHRAL DILATATION N/A 03/06/2016   Procedure: CYSTOSCOPY WITH URETHRAL DILATATION WITH CATHETER PLACEMENT;  Surgeon: [name], MD;  Location: [facility] ORS;  Service: Urology;  Laterality: N/A;  . KNEE SURGERY Left   . LEG SURGERY Left   . TONSILLECTOMY      There were no vitals filed for this visit.      Subjective Assessment - 09/14/16 1621    Subjective  Pt. reports being tired today.   Pertinent History Pt. is a 61 y.o. year-old male who was admitted to [facility] for a Radical Resection of Tumor, Left Femur Resection, and Hemiarthroplasty, secondary to a pathological Fracture with IM Nailing Repair in August of 2017.  Pt. Orthopedic Physician is Dr.  Nydia Bouton with [facility]. The patient reports that he was diagnosed with metastatic RCC in January of 2016; he required a large oncologic resection of the kidney leaving him with BLE paralysis.   Patient Stated Goals To be able to walk again, and improve balance.   Currently in Pain? Yes   Pain Score 3    Pain Location Shoulder   Pain Orientation Right;Left                      OT Treatments/Exercises (OP) - 09/15/16 1312      Fine Motor Coordination   Other Fine Motor Exercises Pt. Worked on grasping 1" resistive cubes with alternating thumb opposition to the tip of his 2nd through 5th digits on a vertical angle. Pt. Worked on pressing them into place while isolating each digit individually. Pt. worked on tasks to sustain lateral pinch on resistive tweezers while grasping and moving 2" toothpick sticks from a horizontal flat position to a vertical position in order to place it in the holder. Pt. was able to sustain grasp while positioning and extending the wrist/hand in the necessary alignment needed to place the stick through the top of the holder.      Neurological Re-education Exercises   Other Exercises 1 Pt. Worked on the [employer] for 8 min. With  constant monitoring of the BUEs. Pt. Worked on changing, and alternating forward reverse position every 2 min. Rest breaks were required. 5# dumbbell ex. for elbow flexion and extension, and forearm supination/pronation.                     OT Long Term Goals - 08/09/16 1805      OT LONG TERM GOAL #1   Title Pt. will improve standing tolerance to be able to complete a standing ADL/IADL task for 11 min. safely with minimal rest breaks.   Baseline limited standing tolerance for ADLs.   Time 12   Period Weeks   Status New     OT LONG TERM GOAL #2   Title Pt. will demonstrate good dynamic standing balance during ADLs/IADLs    Baseline Limited   Time 12   Period Weeks   Status New     OT LONG TERM GOAL #3   Title Pt.  will demonstrate energy conservation/work simplification techniques 100% of the time during ADL/IADL tasks.   Baseline Limited knowledge of energy conservation/work simplififcation techniques   Time 12   Period Weeks   Status New     OT LONG TERM GOAL #4   Title Pt. will Improve UE strength by 2 muscle grades to assist with aADL/IADLs.   Baseline Limited UE strength   Time 12   Period Weeks   Status New     OT LONG TERM GOAL #5   Title Pt. will improve [facility] skills to be able to independently write legibly, and efficiently   Baseline limited legibility   Time 12   Period Weeks   Status New     Long Term Additional Goals   Additional Long Term Goals Yes               Plan - 09/15/16 1258    Clinical Impression Statement Pt. is scheduled for another infusion at the Oncologist office on Tuesday. Pt. continues to work on improving bilateral UE strength, and coordination skills for improved ADL, and IADL functioning. Pt. has limited tolerance for UE Activity in standing secondary a history of back issues.   Rehab Potential Good   Clinical Impairments Affecting Rehab Potential Postive indicators: age, motivation, family support. Negative Indicators; Multiple comorbidities.   OT Frequency 2x / week   OT Duration 12 weeks   OT Treatment/Interventions Self-care/ADL training;Therapeutic exercise;Patient/family education;Manual Therapy;Neuromuscular education;Therapeutic exercises;Therapeutic activities;Energy conservation;DME and/or AE instruction;Moist Heat   Consulted and Agree with Plan of Care Patient      Patient will benefit from skilled therapeutic intervention in order to improve the following deficits and impairments:  Decreased balance, Decreased strength, Decreased mobility, Impaired UE functional use, Decreased knowledge of precautions, Decreased range of motion, Decreased coordination, Decreased endurance, Impaired flexibility, Difficulty walking, Pain, Decreased activity  tolerance, Decreased safety awareness  Visit Diagnosis: Muscle weakness (generalized)  Other lack of coordination    Problem List Patient Active Problem List   Diagnosis Date Noted  . Anemia, iron deficiency 09/06/2016  . Vaccine counseling 09/06/2016  . Arthralgia 08/15/2016  . Arthritis 08/01/2016  . Anemia 08/01/2016  . S/P radiation > 12 weeks 06/16/2016  . Elevated TSH 03/10/2016  . Mild eczema 02/27/2016  . Dyspnea on exertion 02/14/2016  . Elevated troponin 02/14/2016  . Fracture of right lower extremity 02/03/2016  . H/O resection of rib 02/03/2016  . Enlarged prostate without urinary obstruction 02/01/2016  . Urethral stricture 02/01/2016  . Personal history of deep  vein thrombosis 02/01/2016  . CAD (coronary artery disease) 01/19/2016  . Mixed hyperlipidemia 01/19/2016  . Shortness of breath 01/19/2016  . Hydronephrosis 01/10/2016  . Incomplete bladder emptying 01/10/2016  . Chronic pain due to neoplasm 12/13/2015  . Essential hypertension 12/13/2015  . Major depressive disorder with single episode, in partial remission ([place]) 12/13/2015  . Deep venous thrombosis of left popliteal vein ([place]) 06/07/2015  . Renal cell carcinoma ([place]) 01/21/2014  . Bone metastasis ([place]) 01/21/2014  . Lung metastases ([place]) 01/21/2014    Harrel Carina, MS, OTR/L 09/15/2016, 1:16 PM  [place] MAIN [facility] SERVICES [address] [place], [place], [address] Phone: [phone]   Fax:  [phone]  Name: Marc Blake MRN: [id number] Date of Birth: August 13, 1955

## 2016-09-15 NOTE — Patient Instructions (Signed)
OT TREATMENT    Neuro muscular re-education:  Pt. Worked on grasping 1" resistive cubes with alternating thumb opposition to the tip of his 2nd through 5th digits on a vertical angle. Pt. Worked on pressing them into place while isolating each digit individually. Pt. worked on tasks to sustain lateral pinch on resistive tweezers while grasping and moving 2" toothpick sticks from a horizontal flat position to a vertical position in order to place it in the holder. Pt. was able to sustain grasp while positioning and extending the wrist/hand in the necessary alignment needed to place the stick through the top of the holder.   Therapeutic Exercise:  Pt. Worked on the [employer] for 8 min. With constant monitoring of the BUEs. Pt. Worked on changing, and alternating forward reverse position every 2 min. Rest breaks were required. 5# dumbbell ex. for elbow flexion and extension, and forearm supination/pronation.  Selfcare:  Manual Therapy:

## 2016-09-18 ENCOUNTER — Other Ambulatory Visit: Payer: Self-pay | Admitting: Hematology and Oncology

## 2016-09-18 ENCOUNTER — Ambulatory Visit: Payer: Medicaid Other

## 2016-09-18 DIAGNOSIS — C642 Malignant neoplasm of left kidney, except renal pelvis: Secondary | ICD-10-CM

## 2016-09-18 NOTE — Progress Notes (Signed)
[facility] day:  09/23/16   Chief Complaint: Marc Blake is a 61 y.o. male with metastatic renal cell carcinoma who is seen for assessment prior to cycle #12 Opdivo.  HPI: Patient returns to clinic today for further evaluation and consideration of his next infusion of Opdivo. He continues to have pain in his bilateral wrists as well as lower back. This appears to be chronic and unchanged. He is anxious today and has requested to discuss his financial concerns with Education officer, museum. He continues to have dyspnea on exertion. He has a mild peripheral neuropathy. He has no other neurologic complaints. He denies any recent fevers. He has a fair appetite, and his weight is relatively stable. He denies any chest pain, cough, or hemoptysis. He denies any nausea, vomiting, constipation, or diarrhea. He has no urinary complaints. Patient otherwise feels well and offers no further specific complaints.    Past Medical History:  Diagnosis Date  . Acid reflux   . Anxiety   . Arthritis   . CHF (congestive heart failure) ([place])   . Depression   . Depression   . Hyperlipidemia   . Hypertension   . Myocardial infarction   . Renal cancer ([place])   . Renal cell carcinoma ([place])   . Skin cancer     Past Surgical History:  Procedure Laterality Date  . BACK SURGERY    . CARDIAC CATHETERIZATION     with stent  . CYSTOSCOPY WITH BIOPSY  03/06/2016   Procedure: CYSTOSCOPY WITH BIOPSY;  Surgeon: [name], MD;  Location: [facility] ORS;  Service: Urology;;  . [name] WITH URETHRAL DILATATION N/A 03/06/2016   Procedure: CYSTOSCOPY WITH URETHRAL DILATATION WITH CATHETER PLACEMENT;  Surgeon: [name], MD;  Location: [facility] ORS;  Service: Urology;  Laterality: N/A;  . KNEE SURGERY Left   . LEG SURGERY Left   . TONSILLECTOMY      Family History  Problem Relation Age of Onset  . Hypertension Father   . Heart attack Father   . Stroke Sister   . Hypertension  Brother   . Stroke Maternal Uncle     Social History:  reports that he has never smoked. He has never used smokeless tobacco. He reports that he does not drink alcohol or use drugs.  He is originally from [place].  He then lived in [place] for 17 years.  He moved to [place] with his wife.  The patient is alone today.  Allergies: No Known Allergies  Current Medications: Current Outpatient Prescriptions  Medication Sig Dispense Refill  . amLODipine (NORVASC) 10 MG tablet Take 10 mg by mouth daily.  3  . atorvastatin (LIPITOR) 20 MG tablet TAKE 1 TABLET ONCE A DAY (AT BEDTIME) FOR 30 DAYS  2  . busPIRone (BUSPAR) 7.5 MG tablet     . carvedilol (COREG) 12.5 MG tablet TAKE 1 TABLET BY MOUTH TWICE A DAY FOR 30 DAYS  3  . [employer] GENTLE LAXATIVE 10 MG suppository INSERT 1 SUPPOSITORY RECTALLY EVERY 24HRS AS NEEDED FOR CONSTIPATION  0  . [employer] STOOL SOFTENER 8.6-50 MG tablet TAKE 1 TABLET BY MOUTH TWICE A DAY AS NEEDED CONSTIPATION  0  . fluocinonide cream (LIDEX) 0.05 %     . gabapentin (NEURONTIN) 300 MG capsule Take 300 mg by mouth 3 (three) times daily.     Marland Kitchen ibuprofen (ADVIL,MOTRIN) 200 MG tablet Take 200 mg by mouth every 8 (eight) hours as needed for moderate pain. 4 every  8 hours    . ipratropium-albuterol (DUONEB) 0.5-2.5 (3) MG/3ML SOLN     . lisinopril (PRINIVIL,ZESTRIL) 10 MG tablet     . morphine (MS CONTIN) 30 MG 12 hr tablet     . Nivolumab (OPDIVO IV) Inject into the vein.    Marland Kitchen oxyCODONE (OXY IR/ROXICODONE) 5 MG immediate release tablet Take 10 mg by mouth every 6 (six) hours as needed (every 6 h prn).     . senna (SENOKOT) 8.6 MG tablet Take 2 tablets by mouth at bedtime.     . sertraline (ZOLOFT) 100 MG tablet Take 100 mg by mouth at bedtime.  1  . tenofovir (VIREAD) 300 MG tablet Take 1 tablet (300 mg total) by mouth daily. 30 tablet 11  . traZODone (DESYREL) 50 MG tablet Take 50 mg by mouth at bedtime.   3  . triamcinolone cream (KENALOG) 0.5 % Apply topically 2 (two) times  daily. 30 g 1  . XARELTO 20 MG TABS tablet Take 20 mg by mouth daily.  1   Current Facility-Administered Medications  Medication Dose Route Frequency Provider Last Rate Last Dose  . 0.9 %  sodium chloride infusion   Intravenous Once [name], MD        Review of Systems:  GENERAL:  Feels tired.  Could sleep 24/7.  No fevers or sweats.  PERFORMANCE STATUS (ECOG):  1 HEENT:  No visual changes, runny nose, sore throat, mouth sores or tenderness.  Waiting on decision to fix teeth. Lungs:  Shortness of breath on exertion.  No cough or wheezing.  No hemoptysis. Cardiac:  No chest pain, palpitations, orthopnea, or PND. GI:  Appetite is poor.  Chronic constipation.  No nausea, vomiting, diarrhea, melena or hematochezia. GU:  Voiding well.  No urgency, frequency, dysuria, or hematuria. Musculoskeletal:  Healing well s/p surgery at [facility].  Joint pain diffuse(see HPI).  No muscle tenderness. Extremities:  Chronic left lower extremity swelling. Skin:  Eczema on elbows and back, improved off nivolumab. Neuro:  Poor memeory.  No headache, numbness or weakness.  Decreased feeling left leg (chronic).  Left leg feels better than right leg. Endocrine:  No diabetes, thyroid issues, hot flashes or night sweats. Psych:  Happy to be back at work.  Worn down by joint pain and lying around.  Sleep is "so-so". Pain: Joint pain.  Review of systems:  All other systems reviewed and found to be negative.  Physical Exam: Blood pressure (!) 160/75, pulse 71, temperature 97.9 F (36.6 C), temperature source Tympanic, resp. rate 18, weight 232 lb 4.8 oz (105.4 kg). GENERAL:  Well developed, well nourished, slightly fatigued appearing gentleman sitting comfortably in a chair in the exam room in no acute distress.  MENTAL STATUS:  Alert and oriented to person, place and time. HEAD:  Long gray hair with goatee.  Normocephalic, atraumatic, face symmetric, no Cushingoid features. EYES:  Pupils equal round and  reactive to light and accomodation.  No conjunctivitis or scleral icterus. ENT:  Oropharynx clear without lesion.  Poor dentition.  Tongue normal. Mucous membranes moist.  RESPIRATORY:  Clear to auscultation without rales, wheezes or rhonchi. CARDIOVASCULAR:  Regular rate and rhythm without murmur, rub or gallop.  No JVD. ABDOMEN:  Soft, non-tender, with active bowel sounds, and no hepatosplenomegaly.  No masses. SKIN:  Tattoos. Eczema on dorsal surface of forearms and lower back, markedly improved. EXTREMITIES:  No skin discoloration or tenderness.  No palpable cords. LYMPH NODES: No palpable cervical, supraclavicular, axillary or inguinal  adenopathy  NEUROLOGICAL:  Stable.  Walker in room. PSYCH:  Appropriate.  . Imaging studies:  12/28/2013:  Abdominal and pelvic CT scan revealed an 8.5 x 10.2 x 9.2 cm irregularly enhancing mass of the left kidney with some internal calcifications. There were enlarged left sided renal hilar lymph nodes. There were multiple pulmonary nodules in both lower lobes (largest 1.1 cm). There was a destructive bone lesion with an irregular 8.6 x 5.1 cm soft tissue mass invasive of the spinal canal narrowing at least 50% and disturbing the pedicle and portions of the left transverse process rib and T8 vertebral body.  01/15/2014:  Chest CT revealed innumerable pulmonary nodules, a large metastatic lesion at T8 and left eighth rib with invasion into the spinal canal and moderate canal stenosis. There was a left upper pole right kidney mass.  Head MRI was negative. 05/31/2015:  Chest, abdomen, and pelvic CT revealed slight enlargement of multiple pulmonary nodules.  The left renal mass was similar (12.2 x 9.6 cm).  The right kidney lesion was stable (lower pole lesion 2 x 1.7 cm).  Bone scan on 05/31/2015 revealed slight increased activity in the left costal vertebral junction at T7. 12/14/2015:  Chest, abdomen, and pelvic CT revealed an irregular 10.7 cm heterogeneous renal  cortical mass in the upper left kidney and a heterogeneous 2.3 cm renal cortical mass in the lateral lower right kidney.  There was mild bilateral hydroureteronephrosis with bladder distention.  There was mild left para-aortic lymphadenopathy.  There were numerous (greater than 20) pulmonary metastases.  Largest nodule was 2.1 cm in the RLL. There was a partially visualized mildly expansile lytic lesion in the left proximal femur status post surgical transfixation.  Thre was a small probably loculated pleural effusion at the left eighth rib resection site.  There were no other bone abnormalities. 12/24/2015:  Bone scan revealed photopenia from prior removal of the left eighth rib. There was increased  uptake in the medial thoracic spine from T7 and T9 is felt to be due to postoperative fusion.  There was increased uptake throughout much of the left femur as well as increased uptake in the soft tissues of much of the left femur which may be due to previous surgery and radiation therapy change. 03/10/2016:  Chest CT revealed unchanged bilateral pulmonary nodules and a left renal mass. 08/08/2016:  Chest, abdomen, and pelvic CT revealed decreasing left kidney lesion, stable to increase in multifocal pulmonary nodules and a similar appearance of prominent upper abdominal lymph nodes that were worrisome for metastatic adenopathy.     Appointment on 09/19/2016  Component Date Value Ref Range Status  . Sodium 09/19/2016 139  135 - 145 mmol/L Final  . Potassium 09/19/2016 3.9  3.5 - 5.1 mmol/L Final  . Chloride 09/19/2016 101  101 - 111 mmol/L Final  . CO2 09/19/2016 30  22 - 32 mmol/L Final  . Glucose, Bld 09/19/2016 159* 65 - 99 mg/dL Final  . BUN 09/19/2016 20  6 - 20 mg/dL Final  . Creatinine, Ser 09/19/2016 1.11  0.61 - 1.24 mg/dL Final  . Calcium 09/19/2016 9.7  8.9 - 10.3 mg/dL Final  . Total Protein 09/19/2016 8.6* 6.5 - 8.1 g/dL Final  . Albumin 09/19/2016 3.6  3.5 - 5.0 g/dL Final  . AST  09/19/2016 21  15 - 41 U/L Final  . ALT 09/19/2016 10* 17 - 63 U/L Final  . Alkaline Phosphatase 09/19/2016 60  38 - 126 U/L Final  . Total Bilirubin  09/19/2016 0.6  0.3 - 1.2 mg/dL Final  . GFR calc non Af Amer 09/19/2016 >60  >60 mL/min Final  . GFR calc Af Amer 09/19/2016 >60  >60 mL/min Final   Comment: (NOTE) The eGFR has been calculated using the CKD EPI equation. This calculation has not been validated in all clinical situations. eGFR's persistently <60 mL/min signify possible Chronic Kidney Disease.   . Anion gap 09/19/2016 8  5 - 15 Final  . WBC 09/19/2016 9.4  3.8 - 10.6 K/uL Final  . RBC 09/19/2016 4.23* 4.40 - 5.90 MIL/uL Final  . Hemoglobin 09/19/2016 9.5* 13.0 - 18.0 g/dL Final  . HCT 09/19/2016 30.4* 40.0 - 52.0 % Final  . MCV 09/19/2016 71.8* 80.0 - 100.0 fL Final  . MCH 09/19/2016 22.4* 26.0 - 34.0 pg Final  . MCHC 09/19/2016 31.2* 32.0 - 36.0 g/dL Final  . RDW 09/19/2016 20.7* 11.5 - 14.5 % Final  . Platelets 09/19/2016 393  150 - 440 K/uL Final  . Neutrophils Relative % 09/19/2016 79  % Final  . Neutro Abs 09/19/2016 7.5* 1.4 - 6.5 K/uL Final  . Lymphocytes Relative 09/19/2016 8  % Final  . Lymphs Abs 09/19/2016 0.8* 1.0 - 3.6 K/uL Final  . Monocytes Relative 09/19/2016 9  % Final  . Monocytes Absolute 09/19/2016 0.8  0.2 - 1.0 K/uL Final  . Eosinophils Relative 09/19/2016 3  % Final  . Eosinophils Absolute 09/19/2016 0.3  0 - 0.7 K/uL Final  . Basophils Relative 09/19/2016 1  % Final  . Basophils Absolute 09/19/2016 0.1  0 - 0.1 K/uL Final  . Magnesium 09/19/2016 2.0  1.7 - 2.4 mg/dL Final    Assessment:  Marc Blake is a 61 y.o. male with metastatic renal cell carcinoma presenting in 12/2013.  He had a large left renal mass, rib and vertebral body involvement, and multiple pulmonary nodules.  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    He was on Votrient (pazopanib) from 02/13/2014 - 01/21/2016.  He began  nivolumab on 01/28/2016.  He received 3000 cGy to T7 - T9 and associated ribs beginning 05/11/2014.  On 06/15/2014, he received 1 dose of nivolumab.  He presented with progressive paralysis from the waist down on 07/06/2014.  He underwent left posterolateral thoracotomy with left chest wall resection with T8 corpectomy and interbody fusion with donor bone on 07/06/2014.  Pathology revealed metastatic renal cell carcinoma. Post-operatively, he developed sepsis secondary to a submandibular abscess dental abscess.  He underwent tracheostomy, I&D of a right neck abscess, tooth extraction, and PEG tube placement.  Zometa has subsequently been on hold.  He developed a pathologic fracture of the left femur.  He is s/p intramedullary nailing on 01/27/2015.  He received radiation to the left leg.  He was admitted on 04/26/2015 with aspiration pneumonia and opioid overdose. He is currently taking MSContin 15 mg BID and oxycodone 5 mg q day.  He developed a left lower extremity DVT (popliteal and soleal vein) on 06/07/2015.  Left lower extremity ultrasound on 02/07/2016 revealed no evidence of DVT.  He is on long term Xarelto.  Patient has a long standing history of an obstructive uropathy and elevated PVR.  He has undergone urethral dilatation on several occasions.  He underwent cystoscopy, balloon urethral dilatation, and bladder biopsy with Foley catheter placement on 03/06/2016.  Bladder biopsy revealed cystitis with reactive changes, negative for atypia and malignancy.  He is s/p 10 cycles of  nivolumab (01/28/2016 - 08/22/16).  He tolerated his infusions well.  TSH was 6.239 (0.35-4.50) with a normal free T4 (0.79) on 02/25/2016.  TSH was 2.713 (normal) and free T4 0.79 (normal) on 05/19/2016.  Treatment was held on 05/19/2016 secondary to diarrhea and on 05/26/2016 secondary to unexplained hypotension.   Cortisol level was 7.9 with a repeat of 10.3 (normal) on 05/29/2016 at 8:26 AM.  ACTH was 39.1 (normal)  at 10 AM on 05/26/2016.  He was admitted to [facility] from 02/14/2016 - 02/16/2016 with chest pain.  Chest CT angiogram on 02/15/2016 ruled out pulmonary embolism. Imaging revealed slight progression of mediastinal left hilar adenopathy was pulmonary metastatic disease. Stress test on 02/15/2016 ruled out cardiac ischemia.  He was treated with Augmentin for presumed lower extremity cellulitis.  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  There is possible increase in change in the lytic lesion with some movement at the fracture site.  He has broken the distal locking screw.  He ambulates with a rolling walker.  B[facility] scan at [facility] on 06/22/2016 revealed no definite evidence of osseous metastatic disease.  There were photopenic regions overlying the left posterior eighth rib and the proximal left femur, likely representing prior surgical resection  He underwent removal of prior rod/intramedullary nail, radical resection of left femur, resection of tumor, and left hemiarthroplasty at [facility] on 06/16/2016.  Pathology of the left proximal femur revealed metastatic carcinoma compatible with renal cell carcinoma.  He is scheduled to begin physical therapy.   He restarted nivolumab on 08/08/2016.  Cycle #10 was on 08/22/2016.  He has a progressive microcytic anemia c/w iron deficiency and anemia of chronic disease.  After his surgery at [facility] in 05/2016, he required 2 units of PRBCs.  MCV is 74.  Anemia work-up on 07/31/2016 revealed a ferritin 318 (falsely elevated secondary to ESR > 140), iron saturation 12%, TIBC 217 (low), and retic 2.5%.  He began Venofer (08/22/2016).  He has restrictive lung disease secondary to his weight and prior radiation.  He has been prescribed oxygen (not received).    His psoriasis flared on nivolumab, but has improved with topical steroids.  Psoriasis is back to baseline after 2 months off nivolumab.  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  Symptomatically, he is fatigued and does not have much of an appetite. He continues to have diffuse joint pain unrelated to nivolumab. His blood pressure is stable.   Plan: 1.  Labs today:  CBC with diff, CMP, Mg. 2.  Add on labs: hepatitis testing. 3.  Hypotension: 122/75 improved. Continue to hold BP meds per PCP. 4.  Review rheumatology consult note from 08/15/2016 with [name]. Patient reports no further interventions are being considered at this time. 5.  Iron deficient anemia: Proceed with Venofer today. 6.  Constipation: Continue Colace and Senokot prn 7.  Dehydration: Renal function improved. Encouraged him to drink more fluids.  8.  Cycle #12 Opdivo (nivolumab) today. 9.  RTC in 2 weeks for MD assessment, labs (CBC with diff, CMP, Mg), Opdivo, and +/- Venofer  Lloyd Huger, MD 09/23/2016 , 11:29 AM

## 2016-09-19 ENCOUNTER — Inpatient Hospital Stay: Payer: Medicaid Other | Attending: Oncology

## 2016-09-19 ENCOUNTER — Other Ambulatory Visit: Payer: Self-pay | Admitting: Oncology

## 2016-09-19 ENCOUNTER — Inpatient Hospital Stay (HOSPITAL_BASED_OUTPATIENT_CLINIC_OR_DEPARTMENT_OTHER): Payer: Medicaid Other | Admitting: Oncology

## 2016-09-19 ENCOUNTER — Other Ambulatory Visit: Payer: Self-pay | Admitting: Hematology and Oncology

## 2016-09-19 ENCOUNTER — Inpatient Hospital Stay: Payer: Medicaid Other

## 2016-09-19 VITALS — BP 160/75 | HR 71 | Temp 97.9°F | Resp 18 | Wt 232.3 lb

## 2016-09-19 DIAGNOSIS — Z85828 Personal history of other malignant neoplasm of skin: Secondary | ICD-10-CM | POA: Insufficient documentation

## 2016-09-19 DIAGNOSIS — F419 Anxiety disorder, unspecified: Secondary | ICD-10-CM | POA: Diagnosis not present

## 2016-09-19 DIAGNOSIS — Z86718 Personal history of other venous thrombosis and embolism: Secondary | ICD-10-CM | POA: Insufficient documentation

## 2016-09-19 DIAGNOSIS — Z7901 Long term (current) use of anticoagulants: Secondary | ICD-10-CM

## 2016-09-19 DIAGNOSIS — Z79899 Other long term (current) drug therapy: Secondary | ICD-10-CM

## 2016-09-19 DIAGNOSIS — Z8781 Personal history of (healed) traumatic fracture: Secondary | ICD-10-CM | POA: Insufficient documentation

## 2016-09-19 DIAGNOSIS — Z5112 Encounter for antineoplastic immunotherapy: Secondary | ICD-10-CM | POA: Diagnosis not present

## 2016-09-19 DIAGNOSIS — I252 Old myocardial infarction: Secondary | ICD-10-CM | POA: Diagnosis not present

## 2016-09-19 DIAGNOSIS — D509 Iron deficiency anemia, unspecified: Secondary | ICD-10-CM | POA: Diagnosis not present

## 2016-09-19 DIAGNOSIS — I509 Heart failure, unspecified: Secondary | ICD-10-CM | POA: Insufficient documentation

## 2016-09-19 DIAGNOSIS — G629 Polyneuropathy, unspecified: Secondary | ICD-10-CM

## 2016-09-19 DIAGNOSIS — Z923 Personal history of irradiation: Secondary | ICD-10-CM | POA: Diagnosis not present

## 2016-09-19 DIAGNOSIS — K219 Gastro-esophageal reflux disease without esophagitis: Secondary | ICD-10-CM | POA: Diagnosis not present

## 2016-09-19 DIAGNOSIS — C7951 Secondary malignant neoplasm of bone: Secondary | ICD-10-CM | POA: Diagnosis not present

## 2016-09-19 DIAGNOSIS — I10 Essential (primary) hypertension: Secondary | ICD-10-CM

## 2016-09-19 DIAGNOSIS — F329 Major depressive disorder, single episode, unspecified: Secondary | ICD-10-CM | POA: Insufficient documentation

## 2016-09-19 DIAGNOSIS — C642 Malignant neoplasm of left kidney, except renal pelvis: Secondary | ICD-10-CM

## 2016-09-19 DIAGNOSIS — R110 Nausea: Secondary | ICD-10-CM | POA: Diagnosis not present

## 2016-09-19 DIAGNOSIS — C7800 Secondary malignant neoplasm of unspecified lung: Secondary | ICD-10-CM

## 2016-09-19 DIAGNOSIS — E785 Hyperlipidemia, unspecified: Secondary | ICD-10-CM | POA: Insufficient documentation

## 2016-09-19 DIAGNOSIS — C649 Malignant neoplasm of unspecified kidney, except renal pelvis: Secondary | ICD-10-CM

## 2016-09-19 DIAGNOSIS — R0602 Shortness of breath: Secondary | ICD-10-CM

## 2016-09-19 LAB — COMPREHENSIVE METABOLIC PANEL
ALT: 10 U/L — ABNORMAL LOW (ref 17–63)
AST: 21 U/L (ref 15–41)
Albumin: 3.6 g/dL (ref 3.5–5.0)
Alkaline Phosphatase: 60 U/L (ref 38–126)
Anion gap: 8 (ref 5–15)
BUN: 20 mg/dL (ref 6–20)
CO2: 30 mmol/L (ref 22–32)
Calcium: 9.7 mg/dL (ref 8.9–10.3)
Chloride: 101 mmol/L (ref 101–111)
Creatinine, Ser: 1.11 mg/dL (ref 0.61–1.24)
GFR calc Af Amer: >60 mL/min (ref >60)
GFR calc non Af Amer: >60 mL/min (ref >60)
Glucose, Bld: 159 mg/dL — ABNORMAL HIGH (ref 65–99)
Potassium: 3.9 mmol/L (ref 3.5–5.1)
Sodium: 139 mmol/L (ref 135–145)
Total Bilirubin: 0.6 mg/dL (ref 0.3–1.2)
Total Protein: 8.6 g/dL — ABNORMAL HIGH (ref 6.5–8.1)

## 2016-09-19 LAB — CBC WITH DIFFERENTIAL/PLATELET
Basophils Absolute: 0.1 10*3/uL (ref 0–0.1)
Basophils Relative: 1 %
Eosinophils Absolute: 0.3 10*3/uL (ref 0–0.7)
Eosinophils Relative: 3 %
HCT: 30.4 % — ABNORMAL LOW (ref 40.0–52.0)
Hemoglobin: 9.5 g/dL — ABNORMAL LOW (ref 13.0–18.0)
Lymphocytes Relative: 8 %
Lymphs Abs: 0.8 10*3/uL — ABNORMAL LOW (ref 1.0–3.6)
MCH: 22.4 pg — ABNORMAL LOW (ref 26.0–34.0)
MCHC: 31.2 g/dL — ABNORMAL LOW (ref 32.0–36.0)
MCV: 71.8 fL — ABNORMAL LOW (ref 80.0–100.0)
Monocytes Absolute: 0.8 10*3/uL (ref 0.2–1.0)
Monocytes Relative: 9 %
Neutro Abs: 7.5 10*3/uL — ABNORMAL HIGH (ref 1.4–6.5)
Neutrophils Relative %: 79 %
Platelets: 393 10*3/uL (ref 150–440)
RBC: 4.23 MIL/uL — ABNORMAL LOW (ref 4.40–5.90)
RDW: 20.7 % — ABNORMAL HIGH (ref 11.5–14.5)
WBC: 9.4 10*3/uL (ref 3.8–10.6)

## 2016-09-19 LAB — MAGNESIUM: Magnesium: 2 mg/dL (ref 1.7–2.4)

## 2016-09-19 MED ORDER — IRON SUCROSE 20 MG/ML IV SOLN
200.0000 mg | Freq: Once | INTRAVENOUS | Status: AC
  Administered 2016-09-19: 200 mg via INTRAVENOUS
  Filled 2016-09-19: qty 10

## 2016-09-19 MED ORDER — TRIAMCINOLONE ACETONIDE 0.5 % EX CREA: TOPICAL_CREAM | Freq: Two times a day (BID) | CUTANEOUS | 1 refills | Status: DC

## 2016-09-19 MED ORDER — NIVOLUMAB CHEMO INJECTION 100 MG/10ML
240.0000 mg | Freq: Once | INTRAVENOUS | Status: AC
  Administered 2016-09-19: 240 mg via INTRAVENOUS
  Filled 2016-09-19: qty 20

## 2016-09-19 MED ORDER — SODIUM CHLORIDE 0.9 % IV SOLN
Freq: Once | INTRAVENOUS | Status: AC
Start: 2016-09-19 — End: 2016-09-19
  Administered 2016-09-19: 12:00:00 via INTRAVENOUS
  Filled 2016-09-19: qty 1000

## 2016-09-19 MED ORDER — SODIUM CHLORIDE 0.9 % IV SOLN: 200.0000 mg | Freq: Once | INTRAVENOUS | Status: DC

## 2016-09-19 NOTE — Progress Notes (Signed)
Complains of pain in bilateral wrists and lower back. Pt requests to discuss financial concerns with Education officer, museum. Informed pt will let Elease Etienne know to give him a call.

## 2016-09-20 ENCOUNTER — Ambulatory Visit: Payer: Medicaid Other | Attending: Family Medicine

## 2016-09-20 DIAGNOSIS — R278 Other lack of coordination: Secondary | ICD-10-CM

## 2016-09-20 DIAGNOSIS — R262 Difficulty in walking, not elsewhere classified: Secondary | ICD-10-CM

## 2016-09-20 DIAGNOSIS — M6281 Muscle weakness (generalized): Secondary | ICD-10-CM

## 2016-09-20 NOTE — Therapy (Signed)
[place] MAIN [facility] SERVICES [address] [place], [place], [address] Phone: [phone]   Fax:  [phone]  Physical Therapy Treatment  Patient Details  Name: RIORDAN WALLE MRN: [id number] Date of Birth: [birth year] Referring Provider: [name]  Encounter Date: 09/20/2016      PT End of Session - 09/20/16 1302    Visit Number 8   Number of Visits 16   Authorization Type 8/10 G code   PT Start Time 1100   PT Stop Time 1145   PT Time Calculation (min) 45 min   Equipment Utilized During Treatment Gait belt   Activity Tolerance Patient tolerated treatment well;No increased pain   Behavior During Therapy WFL for tasks assessed/performed      Past Medical History:  Diagnosis Date  . Acid reflux   . Anxiety   . Arthritis   . CHF (congestive heart failure) ([place])   . Depression   . Depression   . Hyperlipidemia   . Hypertension   . Myocardial infarction   . Renal cancer ([place])   . Renal cell carcinoma ([place])   . Skin cancer     Past Surgical History:  Procedure Laterality Date  . BACK SURGERY    . CARDIAC CATHETERIZATION     with stent  . CYSTOSCOPY WITH BIOPSY  03/06/2016   Procedure: CYSTOSCOPY WITH BIOPSY;  Surgeon: [name], MD;  Location: [facility] ORS;  Service: Urology;;  . [name] WITH URETHRAL DILATATION N/A 03/06/2016   Procedure: CYSTOSCOPY WITH URETHRAL DILATATION WITH CATHETER PLACEMENT;  Surgeon: [name], MD;  Location: [facility] ORS;  Service: Urology;  Laterality: N/A;  . KNEE SURGERY Left   . LEG SURGERY Left   . TONSILLECTOMY      There were no vitals filed for this visit.      Subjective Assessment - 09/20/16 1113    Subjective Patient reports he lost his job last Friday and has been trying to figure out what to do financially since.    Pertinent History Patient presents with history of bone CA with a femur replacement and hip replacement, Patient reports he continues to have CA in his kidneys  and lungs, prosthetic rib secondary to    Limitations Walking;Standing;Lifting   How long can you stand comfortably? 34mn   How long can you walk comfortably? 10074f  Patient Stated Goals Patient states he wants to walk without the walker   Currently in Pain? Yes   Pain Score 5    Pain Location Back   Pain Orientation Left;Right   Pain Descriptors / Indicators Aching   Pain Frequency Constant      TREATMENT: Ambulation without AD - 2002f with cueing on improving step length and heel strike bilaterally  Standing Marches in standing in  bars against GTB- 3 x 20 Side stepping with hand held support - x30 ft B Nustep Seated position 14 - resistance level 7;  5 min; level 6: 3 min;  Leg Press at MATRIX - 2 x20 with L LE 60#, x5 with 75# (stopped secondary to fatigue)       PT Education - 09/20/16 1118    Education provided Yes   Education Details cueing on form and technique with performing stepping motions    Person(s) Educated Patient   Methods Explanation;Demonstration   Comprehension Verbalized understanding;Returned demonstration             PT Long Term Goals - 08/24/16 1642      PT  LONG TERM GOAL #1   Title Patient will improve 5xSTS score to under 16 to demonstrate significant improvement in LE strength and to more easily transfer from sitting to standing   Baseline 5xSTS: 18.2sec   Time 10   Period Weeks   Status New     PT LONG TERM GOAL #2   Title Patient will improve TUG score to under 10sec to demonstrate significant improvement in fall risk and self managing hallways when at home   Baseline TUG: 20 sec   Time 10   Period Weeks   Status New     PT LONG TERM GOAL #3   Title Patient will decrease 69mnwt to >10035fto decrease 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   Baseline Dependent with form/technique   Time 10   Period Weeks   Status New               Plan - 09/20/16 1303    Clinical Impression Statement Focused on improving LE strength and balance and patient demonstrates improved posture, speed, and step length with performing ambulation. Although patient is improving, she continues to demonstrate decreased strength and balance with the performance of functional activities and patient will benefit from further skilled therapy to return to prior level of function.     Rehab Potential Fair   Clinical Impairments Affecting Rehab Potential (+) highly motivated,  family support (-) history of CA   PT Frequency 2x / week   PT Duration 12 weeks   PT Treatment/Interventions ADLs/Self Care Home Management;Aquatic Therapy;Iontophoresis '4mg'$ /ml Dexamethasone;Moist Heat;Therapeutic exercise;Therapeutic activities;Stair training;Gait training;Neuromuscular re-education;Balance training;Patient/family education;Manual techniques   PT Next Visit Plan Ambulation with and without cane, progress strengthening and balance exercises   Consulted and Agree with Plan of Care Patient      Patient will benefit from skilled therapeutic intervention in order to improve the following deficits and impairments:  Abnormal gait, Increased fascial restricitons, Improper body mechanics, Impaired sensation, Increased muscle spasms, Postural dysfunction, Decreased mobility, Decreased coordination, Decreased strength, Decreased endurance, Difficulty walking, Decreased balance, Impaired flexibility  Visit Diagnosis: Muscle weakness (generalized)  Other lack of coordination  Difficulty in walking, not elsewhere classified     Problem List Patient Active Problem List   Diagnosis Date Noted  . Anemia, iron deficiency 09/06/2016  . Vaccine counseling 09/06/2016  . Arthralgia 08/15/2016  . Arthritis 08/01/2016  . Anemia 08/01/2016  . S/P  radiation > 12 weeks 06/16/2016  . Elevated TSH 03/10/2016  . Mild eczema 02/27/2016  . Dyspnea on exertion 02/14/2016  . Elevated troponin 02/14/2016  . Fracture of right lower extremity 02/03/2016  . H/O resection of rib 02/03/2016  . Enlarged prostate without urinary obstruction 02/01/2016  . Urethral stricture 02/01/2016  . Personal history of deep vein thrombosis 02/01/2016  . CAD (coronary artery disease) 01/19/2016  . Mixed hyperlipidemia 01/19/2016  . Shortness of breath 01/19/2016  . Hydronephrosis 01/10/2016  . Incomplete bladder emptying 01/10/2016  . Chronic pain due to neoplasm 12/13/2015  . Essential hypertension 12/13/2015  . Major depressive disorder with single episode, in partial remission ([place]CC[place]6/26/2017  . Deep venous thrombosis of left popliteal vein ([place]CC[place]2/19/2016  . Renal cell carcinoma ([place]8/10/2013  .  Bone metastasis ([place]) 01/21/2014  . Lung metastases ([place]) 01/21/2014    Blythe Stanford, PT DPT 09/20/2016, 2:26 PM  [place] MAIN [facility] SERVICES [address] [place], [place], [address] Phone: [phone]   Fax:  [phone]  Name: TARIS GALINDO MRN: [id number] Date of Birth: [birth year]

## 2016-09-26 ENCOUNTER — Ambulatory Visit: Payer: Medicaid Other

## 2016-09-26 ENCOUNTER — Ambulatory Visit: Payer: Medicaid Other | Admitting: Occupational Therapy

## 2016-09-28 ENCOUNTER — Ambulatory Visit: Payer: Medicaid Other

## 2016-09-28 ENCOUNTER — Ambulatory Visit: Payer: Medicaid Other | Admitting: Occupational Therapy

## 2016-10-02 ENCOUNTER — Other Ambulatory Visit: Payer: Self-pay | Admitting: *Deleted

## 2016-10-02 DIAGNOSIS — C649 Malignant neoplasm of unspecified kidney, except renal pelvis: Secondary | ICD-10-CM

## 2016-10-03 ENCOUNTER — Ambulatory Visit: Payer: Medicaid Other | Admitting: Occupational Therapy

## 2016-10-03 ENCOUNTER — Encounter: Payer: Self-pay | Admitting: Hematology and Oncology

## 2016-10-03 ENCOUNTER — Other Ambulatory Visit: Payer: Self-pay | Admitting: Hematology and Oncology

## 2016-10-03 ENCOUNTER — Inpatient Hospital Stay: Payer: Medicaid Other

## 2016-10-03 ENCOUNTER — Ambulatory Visit: Payer: Medicaid Other

## 2016-10-03 ENCOUNTER — Inpatient Hospital Stay (HOSPITAL_BASED_OUTPATIENT_CLINIC_OR_DEPARTMENT_OTHER): Payer: Medicaid Other | Admitting: Hematology and Oncology

## 2016-10-03 VITALS — BP 163/84 | HR 81 | Temp 97.0°F | Resp 18 | Wt 226.2 lb

## 2016-10-03 DIAGNOSIS — F329 Major depressive disorder, single episode, unspecified: Secondary | ICD-10-CM

## 2016-10-03 DIAGNOSIS — C649 Malignant neoplasm of unspecified kidney, except renal pelvis: Secondary | ICD-10-CM

## 2016-10-03 DIAGNOSIS — I10 Essential (primary) hypertension: Secondary | ICD-10-CM

## 2016-10-03 DIAGNOSIS — D509 Iron deficiency anemia, unspecified: Secondary | ICD-10-CM

## 2016-10-03 DIAGNOSIS — Z85828 Personal history of other malignant neoplasm of skin: Secondary | ICD-10-CM

## 2016-10-03 DIAGNOSIS — Z5112 Encounter for antineoplastic immunotherapy: Secondary | ICD-10-CM

## 2016-10-03 DIAGNOSIS — K219 Gastro-esophageal reflux disease without esophagitis: Secondary | ICD-10-CM

## 2016-10-03 DIAGNOSIS — Z79899 Other long term (current) drug therapy: Secondary | ICD-10-CM

## 2016-10-03 DIAGNOSIS — I509 Heart failure, unspecified: Secondary | ICD-10-CM

## 2016-10-03 DIAGNOSIS — Z86718 Personal history of other venous thrombosis and embolism: Secondary | ICD-10-CM

## 2016-10-03 DIAGNOSIS — R7989 Other specified abnormal findings of blood chemistry: Secondary | ICD-10-CM

## 2016-10-03 DIAGNOSIS — R110 Nausea: Secondary | ICD-10-CM

## 2016-10-03 DIAGNOSIS — E785 Hyperlipidemia, unspecified: Secondary | ICD-10-CM

## 2016-10-03 DIAGNOSIS — Z923 Personal history of irradiation: Secondary | ICD-10-CM

## 2016-10-03 DIAGNOSIS — Z7901 Long term (current) use of anticoagulants: Secondary | ICD-10-CM

## 2016-10-03 DIAGNOSIS — C642 Malignant neoplasm of left kidney, except renal pelvis: Secondary | ICD-10-CM

## 2016-10-03 DIAGNOSIS — R0602 Shortness of breath: Secondary | ICD-10-CM

## 2016-10-03 DIAGNOSIS — G629 Polyneuropathy, unspecified: Secondary | ICD-10-CM

## 2016-10-03 DIAGNOSIS — Z8781 Personal history of (healed) traumatic fracture: Secondary | ICD-10-CM

## 2016-10-03 DIAGNOSIS — C7800 Secondary malignant neoplasm of unspecified lung: Secondary | ICD-10-CM

## 2016-10-03 DIAGNOSIS — I252 Old myocardial infarction: Secondary | ICD-10-CM

## 2016-10-03 DIAGNOSIS — F419 Anxiety disorder, unspecified: Secondary | ICD-10-CM

## 2016-10-03 DIAGNOSIS — C7951 Secondary malignant neoplasm of bone: Secondary | ICD-10-CM

## 2016-10-03 DIAGNOSIS — Z7189 Other specified counseling: Secondary | ICD-10-CM

## 2016-10-03 LAB — COMPREHENSIVE METABOLIC PANEL
ALT: 9 U/L — ABNORMAL LOW (ref 17–63)
AST: 19 U/L (ref 15–41)
Albumin: 4.1 g/dL (ref 3.5–5.0)
Alkaline Phosphatase: 81 U/L (ref 38–126)
Anion gap: 10 (ref 5–15)
BUN: 21 mg/dL — ABNORMAL HIGH (ref 6–20)
CO2: 28 mmol/L (ref 22–32)
Calcium: 9.7 mg/dL (ref 8.9–10.3)
Chloride: 101 mmol/L (ref 101–111)
Creatinine, Ser: 1.11 mg/dL (ref 0.61–1.24)
GFR calc Af Amer: >60 mL/min (ref >60)
GFR calc non Af Amer: >60 mL/min (ref >60)
Glucose, Bld: 130 mg/dL — ABNORMAL HIGH (ref 65–99)
Potassium: 3.7 mmol/L (ref 3.5–5.1)
Sodium: 139 mmol/L (ref 135–145)
Total Bilirubin: 0.4 mg/dL (ref 0.3–1.2)
Total Protein: 8.7 g/dL — ABNORMAL HIGH (ref 6.5–8.1)

## 2016-10-03 LAB — CBC WITH DIFFERENTIAL/PLATELET
Basophils Absolute: 0.1 10*3/uL (ref 0–0.1)
Basophils Relative: 1 %
Eosinophils Absolute: 0.3 10*3/uL (ref 0–0.7)
Eosinophils Relative: 3 %
HCT: 33 % — ABNORMAL LOW (ref 40.0–52.0)
Hemoglobin: 10.3 g/dL — ABNORMAL LOW (ref 13.0–18.0)
Lymphocytes Relative: 9 %
Lymphs Abs: 0.8 10*3/uL — ABNORMAL LOW (ref 1.0–3.6)
MCH: 22.6 pg — ABNORMAL LOW (ref 26.0–34.0)
MCHC: 31.3 g/dL — ABNORMAL LOW (ref 32.0–36.0)
MCV: 72.2 fL — ABNORMAL LOW (ref 80.0–100.0)
Monocytes Absolute: 0.7 10*3/uL (ref 0.2–1.0)
Monocytes Relative: 8 %
Neutro Abs: 6.6 10*3/uL — ABNORMAL HIGH (ref 1.4–6.5)
Neutrophils Relative %: 79 %
Platelets: 360 10*3/uL (ref 150–440)
RBC: 4.58 MIL/uL (ref 4.40–5.90)
RDW: 20.5 % — ABNORMAL HIGH (ref 11.5–14.5)
WBC: 8.4 10*3/uL (ref 3.8–10.6)

## 2016-10-03 LAB — MAGNESIUM: Magnesium: 2 mg/dL (ref 1.7–2.4)

## 2016-10-03 LAB — FERRITIN: Ferritin: 259 ng/mL (ref 24–336)

## 2016-10-03 LAB — IRON AND TIBC
Iron: 41 ug/dL — ABNORMAL LOW (ref 45–182)
Saturation Ratios: 16 % — ABNORMAL LOW (ref 17.9–39.5)
TIBC: 249 ug/dL — ABNORMAL LOW (ref 250–450)
UIBC: 208 ug/dL

## 2016-10-03 LAB — SEDIMENTATION RATE: Sed Rate: 66 mm/hr — ABNORMAL HIGH (ref 0–20)

## 2016-10-03 LAB — TSH: TSH: 3.468 u[IU]/mL (ref 0.350–4.500)

## 2016-10-03 LAB — T4, FREE: Free T4: 0.82 ng/dL (ref 0.61–1.12)

## 2016-10-03 MED ORDER — SODIUM CHLORIDE 0.9 % IV SOLN
Freq: Once | INTRAVENOUS | Status: AC
  Administered 2016-10-03: 11:00:00 via INTRAVENOUS
  Filled 2016-10-03: qty 1000

## 2016-10-03 MED ORDER — SODIUM CHLORIDE 0.9 % IV SOLN
240.0000 mg | Freq: Once | INTRAVENOUS | Status: AC
  Administered 2016-10-03: 240 mg via INTRAVENOUS
  Filled 2016-10-03: qty 24

## 2016-10-03 NOTE — Progress Notes (Signed)
[facility] day:  10/03/2016   Chief Complaint: Marc Blake is a 61 y.o. male with metastatic renal cell carcinoma who is seen for assessment prior to cycle #13 Opdivo.  HPI: The patient was last seen in the medical oncology clinic by me on 09/05/2016.  At that time he complained of joint pain.  He was busy working.  He received cycle 311 Opdivo.  He received Venofer for his iron deficiency.  He was seen by Dr. Grayland Ormond in my absence on 09/19/2016.  He noted pain in his wrists and lower back.  Pain was chronic and unchanged.  He received cycle #12 Opdivo.  He received Venofer.  During the interim,  he notes some nausea in the morning. He has felt "in a bad way" for the past 2 weeks. He states that his job of 35 years about 2 weeks ago. He is having financial difficulty.  He is applying for [employer] and disability. He is also apply for Medicaid.   Past Medical History:  Diagnosis Date  . Acid reflux   . Anxiety   . Arthritis   . CHF (congestive heart failure) ([place])   . Depression   . Depression   . Hyperlipidemia   . Hypertension   . Myocardial infarction ([place])   . Renal cancer ([place])   . Renal cell carcinoma ([place])   . Skin cancer     Past Surgical History:  Procedure Laterality Date  . BACK SURGERY    . CARDIAC CATHETERIZATION     with stent  . CYSTOSCOPY WITH BIOPSY  03/06/2016   Procedure: CYSTOSCOPY WITH BIOPSY;  Surgeon: [name], MD;  Location: [facility] ORS;  Service: Urology;;  . [name] WITH URETHRAL DILATATION N/A 03/06/2016   Procedure: CYSTOSCOPY WITH URETHRAL DILATATION WITH CATHETER PLACEMENT;  Surgeon: [name], MD;  Location: [facility] ORS;  Service: Urology;  Laterality: N/A;  . KNEE SURGERY Left   . LEG SURGERY Left   . TONSILLECTOMY      Family History  Problem Relation Age of Onset  . Hypertension Father   . Heart attack Father   . Stroke Sister   . Hypertension Brother   . Stroke Maternal  Uncle     Social History:  reports that he has never smoked. He has never used smokeless tobacco. He reports that he does not drink alcohol or use drugs.  He is originally from [place].  He then lived in [place] for 17 years.  He moved to [place] with his wife.  He lost his job of 35 years in early 09/2016.  The patient is alone today.  Allergies: No Known Allergies  Current Medications: Current Outpatient Prescriptions  Medication Sig Dispense Refill  . amLODipine (NORVASC) 10 MG tablet Take 10 mg by mouth daily.  3  . atorvastatin (LIPITOR) 20 MG tablet TAKE 1 TABLET ONCE A DAY (AT BEDTIME) FOR 30 DAYS  2  . busPIRone (BUSPAR) 7.5 MG tablet     . carvedilol (COREG) 12.5 MG tablet TAKE 1 TABLET BY MOUTH TWICE A DAY FOR 30 DAYS  3  . [employer] GENTLE LAXATIVE 10 MG suppository INSERT 1 SUPPOSITORY RECTALLY EVERY 24HRS AS NEEDED FOR CONSTIPATION  0  . [employer] STOOL SOFTENER 8.6-50 MG tablet TAKE 1 TABLET BY MOUTH TWICE A DAY AS NEEDED CONSTIPATION  0  . fluocinonide cream (LIDEX) 0.05 %     . gabapentin (NEURONTIN) 300 MG capsule Take 300 mg by mouth 3 (  three) times daily.     Marland Kitchen ibuprofen (ADVIL,MOTRIN) 200 MG tablet Take 200 mg by mouth every 8 (eight) hours as needed for moderate pain. 4 every 8 hours    . ipratropium-albuterol (DUONEB) 0.5-2.5 (3) MG/3ML SOLN     . lisinopril (PRINIVIL,ZESTRIL) 10 MG tablet     . morphine (MS CONTIN) 30 MG 12 hr tablet     . Nivolumab (OPDIVO IV) Inject into the vein.    Marland Kitchen oxyCODONE (OXY IR/ROXICODONE) 5 MG immediate release tablet Take 10 mg by mouth every 6 (six) hours as needed (every 6 h prn).     . senna (SENOKOT) 8.6 MG tablet Take 2 tablets by mouth at bedtime.     . sertraline (ZOLOFT) 100 MG tablet Take 100 mg by mouth at bedtime.  1  . tenofovir (VIREAD) 300 MG tablet Take 1 tablet (300 mg total) by mouth daily. 30 tablet 11  . traZODone (DESYREL) 50 MG tablet Take 50 mg by mouth at bedtime.   3  . triamcinolone cream (KENALOG) 0.5 % Apply  topically 2 (two) times daily. 30 g 1  . XARELTO 20 MG TABS tablet Take 20 mg by mouth daily.  1   Current Facility-Administered Medications  Medication Dose Route Frequency Provider Last Rate Last Dose  . 0.9 %  sodium chloride infusion   Intravenous Once [name], MD        Review of Systems:  GENERAL:  Feels is a bad way since lost job of 35 years.  No fevers or sweats.  Weight down 6 pounds. PERFORMANCE STATUS (ECOG):  1 HEENT:  No visual changes, runny nose, sore throat, mouth sores or tenderness.  Waiting on decision to fix teeth. Lungs:  Shortness of breath on exertion.  No cough or wheezing.  No hemoptysis. Cardiac:  No chest pain, palpitations, orthopnea, or PND. GI:  Chronic constipation.  No nausea, vomiting, diarrhea, melena or hematochezia. GU:  No urgency, frequency, dysuria, or hematuria. Self caths 3-4 x/week. Musculoskeletal:  Healed well s/p surgery PT stopped).  Joint pain diffuse.  No muscle tenderness. Extremities:  Chronic left lower extremity swelling. Skin:  Eczema on elbows and back, well controlled. Neuro:  Poor memory.  No headache, numbness or weakness.  Decreased feeling left leg (chronic).  Left leg feels better than right leg. Endocrine:  No diabetes, thyroid issues, hot flashes or night sweats. Psych:  Feels in "a bad way" since lost job.  Pain: Joint pain. Review of systems:  All other systems reviewed and found to be negative.  Physical Exam: Blood pressure (!) 163/84, pulse 81, temperature 97 F (36.1 C), temperature source Tympanic, resp. rate 18, weight 226 lb 3 oz (102.6 kg). GENERAL:  Slightly fatigued appearing gentleman sitting comfortably in a chair in the exam room in no acute distress. He has a cane at his side. MENTAL STATUS:  Alert and oriented to person, place and time. HEAD:  Long gray hair with goatee.  Normocephalic, atraumatic, face symmetric, no Cushingoid features. EYES:  Pupils equal round and reactive to light and  accomodation.  No conjunctivitis or scleral icterus. ENT:  Oropharynx clear without lesion.  Poor dentition.  Tongue normal. Mucous membranes moist.  RESPIRATORY:  Clear to auscultation without rales, wheezes or rhonchi. CARDIOVASCULAR:  Regular rate and rhythm without murmur, rub or gallop.  No JVD. ABDOMEN:  Soft, non-tender, with active bowel sounds, and no hepatosplenomegaly.  No masses. SKIN:  Tattoos. Eczema on dorsal surface of forearms and  lower back, minimal. EXTREMITIES:  No skin discoloration or tenderness.  No palpable cords. LYMPH NODES: No palpable cervical, supraclavicular, axillary or inguinal adenopathy  NEUROLOGICAL:  Stable.   PSYCH:  Appropriate.  .  Imaging studies: 12/28/2013:  Abdominal and pelvic CT scan revealed an 8.5 x 10.2 x 9.2 cm irregularly enhancing mass of the left kidney with some internal calcifications. There were enlarged left sided renal hilar lymph nodes. There were multiple pulmonary nodules in both lower lobes (largest 1.1 cm). There was a destructive bone lesion with an irregular 8.6 x 5.1 cm soft tissue mass invasive of the spinal canal narrowing at least 50% and disturbing the pedicle and portions of the left transverse process rib and T8 vertebral body.  01/15/2014:  Chest CT revealed innumerable pulmonary nodules, a large metastatic lesion at T8 and left eighth rib with invasion into the spinal canal and moderate canal stenosis. There was a left upper pole right kidney mass.  Head MRI was negative. 05/31/2015:  Chest, abdomen, and pelvic CT revealed slight enlargement of multiple pulmonary nodules.  The left renal mass was similar (12.2 x 9.6 cm).  The right kidney lesion was stable (lower pole lesion 2 x 1.7 cm).  Bone scan on 05/31/2015 revealed slight increased activity in the left costal vertebral junction at T7. 12/14/2015:  Chest, abdomen, and pelvic CT revealed an irregular 10.7 cm heterogeneous renal cortical mass in the upper left kidney and a  heterogeneous 2.3 cm renal cortical mass in the lateral lower right kidney.  There was mild bilateral hydroureteronephrosis with bladder distention.  There was mild left para-aortic lymphadenopathy.  There were numerous (greater than 20) pulmonary metastases.  Largest nodule was 2.1 cm in the RLL. There was a partially visualized mildly expansile lytic lesion in the left proximal femur status post surgical transfixation.  Thre was a small probably loculated pleural effusion at the left eighth rib resection site.  There were no other bone abnormalities. 12/24/2015:  Bone scan revealed photopenia from prior removal of the left eighth rib. There was increased  uptake in the medial thoracic spine from T7 and T9 is felt to be due to postoperative fusion.  There was increased uptake throughout much of the left femur as well as increased uptake in the soft tissues of much of the left femur which may be due to previous surgery and radiation therapy change. 03/10/2016:  Chest CT revealed unchanged bilateral pulmonary nodules and a left renal mass. 08/08/2016:  Chest, abdomen, and pelvic CT revealed decreasing left kidney lesion, stable to increase in multifocal pulmonary nodules and a similar appearance of prominent upper abdominal lymph nodes that were worrisome for metastatic adenopathy.     Appointment on 10/03/2016  Component Date Value Ref Range Status  . WBC 10/03/2016 8.4  3.8 - 10.6 K/uL Final  . RBC 10/03/2016 4.58  4.40 - 5.90 MIL/uL Final  . Hemoglobin 10/03/2016 10.3* 13.0 - 18.0 g/dL Final  . HCT 10/03/2016 33.0* 40.0 - 52.0 % Final  . MCV 10/03/2016 72.2* 80.0 - 100.0 fL Final  . MCH 10/03/2016 22.6* 26.0 - 34.0 pg Final  . MCHC 10/03/2016 31.3* 32.0 - 36.0 g/dL Final  . RDW 10/03/2016 20.5* 11.5 - 14.5 % Final  . Platelets 10/03/2016 360  150 - 440 K/uL Final  . Neutrophils Relative % 10/03/2016 79  % Final  . Neutro Abs 10/03/2016 6.6* 1.4 - 6.5 K/uL Final  . Lymphocytes Relative 10/03/2016  9  % Final  . Lymphs Abs  10/03/2016 0.8* 1.0 - 3.6 K/uL Final  . Monocytes Relative 10/03/2016 8  % Final  . Monocytes Absolute 10/03/2016 0.7  0.2 - 1.0 K/uL Final  . Eosinophils Relative 10/03/2016 3  % Final  . Eosinophils Absolute 10/03/2016 0.3  0 - 0.7 K/uL Final  . Basophils Relative 10/03/2016 1  % Final  . Basophils Absolute 10/03/2016 0.1  0 - 0.1 K/uL Final  . Sodium 10/03/2016 139  135 - 145 mmol/L Final  . Potassium 10/03/2016 3.7  3.5 - 5.1 mmol/L Final  . Chloride 10/03/2016 101  101 - 111 mmol/L Final  . CO2 10/03/2016 28  22 - 32 mmol/L Final  . Glucose, Bld 10/03/2016 130* 65 - 99 mg/dL Final  . BUN 10/03/2016 21* 6 - 20 mg/dL Final  . Creatinine, Ser 10/03/2016 1.11  0.61 - 1.24 mg/dL Final  . Calcium 10/03/2016 9.7  8.9 - 10.3 mg/dL Final  . Total Protein 10/03/2016 8.7* 6.5 - 8.1 g/dL Final  . Albumin 10/03/2016 4.1  3.5 - 5.0 g/dL Final  . AST 10/03/2016 19  15 - 41 U/L Final  . ALT 10/03/2016 9* 17 - 63 U/L Final  . Alkaline Phosphatase 10/03/2016 81  38 - 126 U/L Final  . Total Bilirubin 10/03/2016 0.4  0.3 - 1.2 mg/dL Final  . GFR calc non Af Amer 10/03/2016 >60  >60 mL/min Final  . GFR calc Af Amer 10/03/2016 >60  >60 mL/min Final   Comment: (NOTE) The eGFR has been calculated using the CKD EPI equation. This calculation has not been validated in all clinical situations. eGFR's persistently <60 mL/min signify possible Chronic Kidney Disease.   . Anion gap 10/03/2016 10  5 - 15 Final  . Magnesium 10/03/2016 2.0  1.7 - 2.4 mg/dL Final    Assessment:  Marc Blake is a 61 y.o. male with metastatic renal cell carcinoma presenting in 12/2013.  He had a large left renal mass, rib and vertebral body involvement, and multiple pulmonary nodules.  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    He was on Votrient (pazopanib) from 02/13/2014 - 01/21/2016.  He began nivolumab on 01/28/2016.  He received 3000 cGy to  T7 - T9 and associated ribs beginning 05/11/2014.  On 06/15/2014, he received 1 dose of nivolumab.  He presented with progressive paralysis from the waist down on 07/06/2014.  He underwent left posterolateral thoracotomy with left chest wall resection with T8 corpectomy and interbody fusion with donor bone on 07/06/2014.  Pathology revealed metastatic renal cell carcinoma. Post-operatively, he developed sepsis secondary to a submandibular abscess dental abscess.  He underwent tracheostomy, I&D of a right neck abscess, tooth extraction, and PEG tube placement.  Zometa has subsequently been on hold.  He developed a pathologic fracture of the left femur.  He is s/p intramedullary nailing on 01/27/2015.  He received radiation to the left leg.  He was admitted on 04/26/2015 with aspiration pneumonia and opioid overdose. He is currently taking MSContin 15 mg BID and oxycodone 5 mg q day.  He developed a left lower extremity DVT (popliteal and soleal vein) on 06/07/2015.  Left lower extremity ultrasound on 02/07/2016 revealed no evidence of DVT.  He is on long term Xarelto.  Patient has a long standing history of an obstructive uropathy and elevated PVR.  He has undergone urethral dilatation on several occasions.  He underwent cystoscopy, balloon urethral dilatation, and bladder biopsy with Foley catheter placement on  03/06/2016.  Bladder biopsy revealed cystitis with reactive changes, negative for atypia and malignancy.  [facility] was [facility] - 02/16/2016 with chest pain.  Chest CT angiogram on 02/15/2016 ruled out pulmonary embolism. Imaging revealed slight progression of mediastinal left hilar adenopathy was pulmonary metastatic disease. Stress test on 02/15/2016 ruled out cardiac ischemia.  He was treated with Augmentin for presumed lower extremity cellulitis.  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  There is possible increase in change in the lytic lesion with some  movement at the fracture site.  He has broken the distal locking screw.  He ambulates with a rolling walker.  Bone scan at [facility] on 06/22/2016 revealed no definite evidence of osseous metastatic disease.  There were photopenic regions overlying the left posterior eighth rib and the proximal left femur, likely representing prior surgical resection  He underwent removal of prior rod/intramedullary nail, radical resection of left femur, resection of tumor, and left hemiarthroplasty at [facility] on 06/16/2016.  Pathology of the left proximal femur revealed metastatic carcinoma compatible with renal cell carcinoma.  He is scheduled to begin physical therapy.   He is s/p 12 cycles of nivolumab (01/28/2016 - 06/06/2016; 08/08/2016 - 09/19/2016).  He tolerated his infusions well.  TSH was 6.239 (0.35-4.50) with a normal free T4 (0.79) on 02/25/2016.  TSH was 2.713 (normal) and free T4 0.79 (normal) on 05/19/2016.  Treatment was held on 05/19/2016 secondary to diarrhea and on 05/26/2016 secondary to unexplained hypotension.   Cortisol level was 7.9 with a repeat of 10.3 (normal) on 05/29/2016 at 8:26 AM.  ACTH was 39.1 (normal) at 10 AM on 05/26/2016.  He has a microcytic anemia c/w iron deficiency and anemia of chronic disease.  After his surgery at [facility] in 05/2016, he required 2 units of PRBCs.  MCV was 74.  Anemia work-up on 07/31/2016 revealed a ferritin 318 (falsely elevated secondary to ESR > 140), iron saturation 12%, TIBC 217 (low), and retic 2.5%.  He has received Venofer x 3 (08/22/2016, 09/05/2016, and 09/19/2016).  He has restrictive lung disease secondary to his weight and prior radiation.  He has been prescribed oxygen (not received).    His psoriasis flared on nivolumab, but has improved with topical steroids.  Psoriasis was back to baseline after 2 months off nivolumab.  He continues to postpone dental work Elie Confer on hold).   He has diffuse joint pain unrelated to nivolumab.  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  He notes morning nausea.  Hematocrit has improved.  Plan: 1.  Labs today:  CBC with diff, CMP, Mg, TSH, ferritin, iron studies, sed rate. 2.  Discuss need for restaging studies.  Discuss consideration of head imaging if symptoms continue.  Patient overwhelmed today (defer). 3.  Cycle #13 Opdivo (nivolumab) today. 4.  No Venofer today. 5.  Patient to talk to [facility] re: finances. 6.  RTC in 2 weeks for MD assessment, labs (CBC with diff, CMP, Mg), and cycle #14 Opdivo +/- Venofer. 7.  RTC in 4 weeks for MD assessment, labs (CBC with diff, CMP, Mg), and cycle # 15 Opdivo +/- Venofer   [name], MD 10/03/2016, 10:21 AM

## 2016-10-03 NOTE — Progress Notes (Signed)
Patient has had morning nausea for the past 2 weeks.  States he is "in the dumps and doesn't feel like doing anything".  He has no "path" right now.  He lost his job of 35 years a few weeks ago.

## 2016-10-05 ENCOUNTER — Ambulatory Visit: Payer: 59

## 2016-10-10 ENCOUNTER — Other Ambulatory Visit: Payer: Self-pay

## 2016-10-10 ENCOUNTER — Ambulatory Visit: Payer: Self-pay

## 2016-10-10 ENCOUNTER — Ambulatory Visit: Payer: 59

## 2016-10-10 ENCOUNTER — Encounter: Payer: 59 | Admitting: Occupational Therapy

## 2016-10-13 ENCOUNTER — Ambulatory Visit: Payer: 59

## 2016-10-13 ENCOUNTER — Encounter: Payer: 59 | Admitting: Occupational Therapy

## 2016-10-13 NOTE — Progress Notes (Unsigned)
PSN left patient a voice mail message today asking that he call back to discuss his financial concerns.

## 2016-10-16 ENCOUNTER — Encounter: Payer: 59 | Admitting: Occupational Therapy

## 2016-10-16 ENCOUNTER — Ambulatory Visit: Payer: 59

## 2016-10-17 ENCOUNTER — Telehealth: Payer: Self-pay | Admitting: *Deleted

## 2016-10-17 ENCOUNTER — Inpatient Hospital Stay: Payer: Medicaid Other | Attending: Hematology and Oncology

## 2016-10-17 ENCOUNTER — Encounter: Payer: Self-pay | Admitting: Hematology and Oncology

## 2016-10-17 ENCOUNTER — Inpatient Hospital Stay: Payer: Medicaid Other

## 2016-10-17 ENCOUNTER — Other Ambulatory Visit: Payer: Self-pay | Admitting: *Deleted

## 2016-10-17 ENCOUNTER — Inpatient Hospital Stay (HOSPITAL_BASED_OUTPATIENT_CLINIC_OR_DEPARTMENT_OTHER): Payer: Medicaid Other | Admitting: Hematology and Oncology

## 2016-10-17 VITALS — BP 150/91 | HR 111 | Temp 99.1°F | Resp 20 | Wt 233.3 lb

## 2016-10-17 DIAGNOSIS — Z85828 Personal history of other malignant neoplasm of skin: Secondary | ICD-10-CM

## 2016-10-17 DIAGNOSIS — Z8781 Personal history of (healed) traumatic fracture: Secondary | ICD-10-CM | POA: Diagnosis not present

## 2016-10-17 DIAGNOSIS — F419 Anxiety disorder, unspecified: Secondary | ICD-10-CM | POA: Insufficient documentation

## 2016-10-17 DIAGNOSIS — E785 Hyperlipidemia, unspecified: Secondary | ICD-10-CM | POA: Diagnosis not present

## 2016-10-17 DIAGNOSIS — D509 Iron deficiency anemia, unspecified: Secondary | ICD-10-CM

## 2016-10-17 DIAGNOSIS — M79602 Pain in left arm: Secondary | ICD-10-CM | POA: Insufficient documentation

## 2016-10-17 DIAGNOSIS — C7800 Secondary malignant neoplasm of unspecified lung: Secondary | ICD-10-CM | POA: Diagnosis not present

## 2016-10-17 DIAGNOSIS — I252 Old myocardial infarction: Secondary | ICD-10-CM | POA: Insufficient documentation

## 2016-10-17 DIAGNOSIS — Z79899 Other long term (current) drug therapy: Secondary | ICD-10-CM | POA: Diagnosis not present

## 2016-10-17 DIAGNOSIS — C649 Malignant neoplasm of unspecified kidney, except renal pelvis: Secondary | ICD-10-CM | POA: Diagnosis not present

## 2016-10-17 DIAGNOSIS — J984 Other disorders of lung: Secondary | ICD-10-CM | POA: Diagnosis not present

## 2016-10-17 DIAGNOSIS — R7989 Other specified abnormal findings of blood chemistry: Secondary | ICD-10-CM

## 2016-10-17 DIAGNOSIS — K219 Gastro-esophageal reflux disease without esophagitis: Secondary | ICD-10-CM | POA: Insufficient documentation

## 2016-10-17 DIAGNOSIS — Z86718 Personal history of other venous thrombosis and embolism: Secondary | ICD-10-CM | POA: Insufficient documentation

## 2016-10-17 DIAGNOSIS — Z8701 Personal history of pneumonia (recurrent): Secondary | ICD-10-CM | POA: Insufficient documentation

## 2016-10-17 DIAGNOSIS — R110 Nausea: Secondary | ICD-10-CM | POA: Insufficient documentation

## 2016-10-17 DIAGNOSIS — C7951 Secondary malignant neoplasm of bone: Secondary | ICD-10-CM | POA: Insufficient documentation

## 2016-10-17 DIAGNOSIS — M1990 Unspecified osteoarthritis, unspecified site: Secondary | ICD-10-CM

## 2016-10-17 DIAGNOSIS — F329 Major depressive disorder, single episode, unspecified: Secondary | ICD-10-CM

## 2016-10-17 DIAGNOSIS — I10 Essential (primary) hypertension: Secondary | ICD-10-CM | POA: Diagnosis not present

## 2016-10-17 DIAGNOSIS — D649 Anemia, unspecified: Secondary | ICD-10-CM

## 2016-10-17 DIAGNOSIS — Z598 Other problems related to housing and economic circumstances: Secondary | ICD-10-CM | POA: Insufficient documentation

## 2016-10-17 DIAGNOSIS — C642 Malignant neoplasm of left kidney, except renal pelvis: Secondary | ICD-10-CM

## 2016-10-17 DIAGNOSIS — I509 Heart failure, unspecified: Secondary | ICD-10-CM

## 2016-10-17 LAB — CBC WITH DIFFERENTIAL/PLATELET
Basophils Absolute: 0.1 10*3/uL (ref 0–0.1)
Basophils Relative: 1 %
Eosinophils Absolute: 0.3 10*3/uL (ref 0–0.7)
Eosinophils Relative: 3 %
HCT: 30.1 % — ABNORMAL LOW (ref 40.0–52.0)
Hemoglobin: 9.6 g/dL — ABNORMAL LOW (ref 13.0–18.0)
Lymphocytes Relative: 8 %
Lymphs Abs: 0.7 10*3/uL — ABNORMAL LOW (ref 1.0–3.6)
MCH: 22.7 pg — ABNORMAL LOW (ref 26.0–34.0)
MCHC: 31.8 g/dL — ABNORMAL LOW (ref 32.0–36.0)
MCV: 71.5 fL — ABNORMAL LOW (ref 80.0–100.0)
Monocytes Absolute: 0.9 10*3/uL (ref 0.2–1.0)
Monocytes Relative: 11 %
Neutro Abs: 6.4 10*3/uL (ref 1.4–6.5)
Neutrophils Relative %: 77 %
Platelets: 321 10*3/uL (ref 150–440)
RBC: 4.21 MIL/uL — ABNORMAL LOW (ref 4.40–5.90)
RDW: 20.8 % — ABNORMAL HIGH (ref 11.5–14.5)
WBC: 8.4 10*3/uL (ref 3.8–10.6)

## 2016-10-17 LAB — COMPREHENSIVE METABOLIC PANEL
ALT: 10 U/L — ABNORMAL LOW (ref 17–63)
AST: 17 U/L (ref 15–41)
Albumin: 3.6 g/dL (ref 3.5–5.0)
Alkaline Phosphatase: 69 U/L (ref 38–126)
Anion gap: 11 (ref 5–15)
BUN: 19 mg/dL (ref 6–20)
CO2: 27 mmol/L (ref 22–32)
Calcium: 9.5 mg/dL (ref 8.9–10.3)
Chloride: 100 mmol/L — ABNORMAL LOW (ref 101–111)
Creatinine, Ser: 1.09 mg/dL (ref 0.61–1.24)
GFR calc Af Amer: >60 mL/min (ref >60)
GFR calc non Af Amer: >60 mL/min (ref >60)
Glucose, Bld: 140 mg/dL — ABNORMAL HIGH (ref 65–99)
Potassium: 3.3 mmol/L — ABNORMAL LOW (ref 3.5–5.1)
Sodium: 138 mmol/L (ref 135–145)
Total Bilirubin: 0.3 mg/dL (ref 0.3–1.2)
Total Protein: 8.2 g/dL — ABNORMAL HIGH (ref 6.5–8.1)

## 2016-10-17 LAB — TSH: TSH: 3.97 u[IU]/mL (ref 0.350–4.500)

## 2016-10-17 LAB — MAGNESIUM: Magnesium: 2.1 mg/dL (ref 1.7–2.4)

## 2016-10-17 MED ORDER — POTASSIUM CHLORIDE CRYS ER 20 MEQ PO TBCR: 20.0000 meq | EXTENDED_RELEASE_TABLET | Freq: Every day | ORAL | 0 refills | Status: DC

## 2016-10-17 NOTE — Telephone Encounter (Signed)
Called patient and informed him that his potassium was low and that we sent in a rx for kcl 1 tab daily for 3 days and to keep the extra tablets ordered in case we needed them later, voiced understanding.

## 2016-10-17 NOTE — Progress Notes (Addendum)
[facility] day:  10/17/2016   Chief Complaint: Marc Blake is a 61 y.o. male with metastatic renal cell carcinoma who is seen for assessment prior to cycle #14 Opdivo.  HPI: The patient was last seen in the medical oncology clinic on 10/03/2016.  At that time,  He had just lost his job of 35 year.  He was struggling with that loss.  He described some morning nausea.  He denied any bone pain.  He discussed concerns about financial issues.  We discussed follow-up with Elease Etienne, SSW.  He received cycle #13 Opdivo.    During the interim, he notes new problems with his neck, left shoulder and arm.  He states that he is unable to raise his left arm.  There is a spot on his shoulder that hurts. He describes trouble reaching out.      Past Medical History:  Diagnosis Date  . Acid reflux   . Anxiety   . Arthritis   . CHF (congestive heart failure) ([place])   . Depression   . Depression   . Hyperlipidemia   . Hypertension   . Myocardial infarction ([place])   . Renal cancer ([place])   . Renal cell carcinoma ([place])   . Skin cancer     Past Surgical History:  Procedure Laterality Date  . BACK SURGERY    . CARDIAC CATHETERIZATION     with stent  . CYSTOSCOPY WITH BIOPSY  03/06/2016   Procedure: CYSTOSCOPY WITH BIOPSY;  Surgeon: [name], MD;  Location: [facility] ORS;  Service: Urology;;  . [name] WITH URETHRAL DILATATION N/A 03/06/2016   Procedure: CYSTOSCOPY WITH URETHRAL DILATATION WITH CATHETER PLACEMENT;  Surgeon: [name], MD;  Location: [facility] ORS;  Service: Urology;  Laterality: N/A;  . KNEE SURGERY Left   . LEG SURGERY Left   . TONSILLECTOMY      Family History  Problem Relation Age of Onset  . Hypertension Father   . Heart attack Father   . Stroke Sister   . Hypertension Brother   . Stroke Maternal Uncle     Social History:  reports that he has never smoked. He has never used smokeless tobacco. He reports that he does not  drink alcohol or use drugs.  He is originally from [place].  He then lived in [place] for 17 years.  He moved to [place] with his wife.  He lost his job of 35 years in early 09/2016.  The patient is alone today.  Allergies: No Known Allergies  Current Medications: Current Outpatient Prescriptions  Medication Sig Dispense Refill  . amLODipine (NORVASC) 10 MG tablet Take 10 mg by mouth daily.  3  . atorvastatin (LIPITOR) 20 MG tablet TAKE 1 TABLET ONCE A DAY (AT BEDTIME) FOR 30 DAYS  2  . busPIRone (BUSPAR) 7.5 MG tablet     . carvedilol (COREG) 12.5 MG tablet TAKE 1 TABLET BY MOUTH TWICE A DAY FOR 30 DAYS  3  . [employer] GENTLE LAXATIVE 10 MG suppository INSERT 1 SUPPOSITORY RECTALLY EVERY 24HRS AS NEEDED FOR CONSTIPATION  0  . [employer] STOOL SOFTENER 8.6-50 MG tablet TAKE 1 TABLET BY MOUTH TWICE A DAY AS NEEDED CONSTIPATION  0  . fluocinonide cream (LIDEX) 0.05 %     . gabapentin (NEURONTIN) 300 MG capsule Take 300 mg by mouth 3 (three) times daily.     Marland Kitchen ibuprofen (ADVIL,MOTRIN) 200 MG tablet Take 200 mg by mouth every 8 (eight) hours as  needed for moderate pain. 4 every 8 hours    . ipratropium-albuterol (DUONEB) 0.5-2.5 (3) MG/3ML SOLN     . lisinopril (PRINIVIL,ZESTRIL) 10 MG tablet     . morphine (MS CONTIN) 30 MG 12 hr tablet     . Nivolumab (OPDIVO IV) Inject into the vein.    Marland Kitchen oxyCODONE (OXY IR/ROXICODONE) 5 MG immediate release tablet Take 10 mg by mouth every 6 (six) hours as needed (every 6 h prn).     . senna (SENOKOT) 8.6 MG tablet Take 2 tablets by mouth at bedtime.     . sertraline (ZOLOFT) 100 MG tablet Take 100 mg by mouth at bedtime.  1  . tenofovir (VIREAD) 300 MG tablet Take 1 tablet (300 mg total) by mouth daily. 30 tablet 11  . traZODone (DESYREL) 50 MG tablet Take 50 mg by mouth at bedtime.   3  . triamcinolone cream (KENALOG) 0.5 % Apply topically 2 (two) times daily. 30 g 1  . XARELTO 20 MG TABS tablet Take 20 mg by mouth daily.  1   Current Facility-Administered  Medications  Medication Dose Route Frequency Provider Last Rate Last Dose  . 0.9 %  sodium chloride infusion   Intravenous Once [name], MD        Review of Systems:  GENERAL:  Feels "tired".  No fevers or sweats.  Weight down 3 pounds. PERFORMANCE STATUS (ECOG):  1 HEENT:  No visual changes, runny nose, sore throat, mouth sores or tenderness.  Waiting on decision to fix teeth. Lungs:  Shortness of breath on exertion.  No cough or wheezing.  No hemoptysis. Cardiac:  No chest pain, palpitations, orthopnea, or PND. GI:  Chronic constipation.  No nausea, vomiting, diarrhea, melena or hematochezia. GU:  No urgency, frequency, dysuria, or hematuria. Self caths 3-4 x/week. Musculoskeletal:  Left shoulder pain (see HPI).  Wrist pain.  No muscle tenderness. Extremities:  Chronic left lower extremity swelling. Skin:  Eczema on elbows and back, mild. Neuro:  Poor memory.  No focal numbness.  Decreased movement in left shoulder.  No headache.  Decreased feeling left leg (chronic). Endocrine:  No diabetes, thyroid issues, hot flashes or night sweats. Psych:  Struggling with not working. Pain: Left shoulder and wrist pain. Pain 6 out of 10. Review of systems:  All other systems reviewed and found to be negative.  Physical Exam: Blood pressure (!) 150/91, pulse (!) 111, temperature 99.1 F (37.3 C), temperature source Tympanic, resp. rate 20, weight 233 lb 5 oz (105.8 kg). GENERAL:  Fatigued appearing gentleman sitting comfortably in a chair in the exam room in no acute distress.  MENTAL STATUS:  Alert and oriented to person, place and time. HEAD:  Long gray hair with goatee.  Normocephalic, atraumatic, face symmetric, no Cushingoid features. EYES:  Pupils equal round and reactive to light and accomodation.  No conjunctivitis or scleral icterus. ENT:  Oropharynx clear without lesion.  Poor dentition.  Tongue normal. Mucous membranes moist.  RESPIRATORY:  Clear to auscultation without  rales, wheezes or rhonchi. CARDIOVASCULAR:  Regular rate and rhythm without murmur, rub or gallop.  No JVD. ABDOMEN:  Soft, non-tender, with active bowel sounds, and no hepatosplenomegaly.  No masses. SKIN:  Tattoos. Eczema on dorsal surface of forearms and lower back, minimal. EXTREMITIES:  Pain left shoulder.  No skin discoloration or tenderness.  No palpable cords. LYMPH NODES: No palpable cervical, supraclavicular, axillary or inguinal adenopathy  NEUROLOGICAL:  Decreased ROM left arm.  Grips strength symmetric.  PSYCH:  Appropriate.  . Imaging studies: 12/28/2013:  Abdominal and pelvic CT scan revealed an 8.5 x 10.2 x 9.2 cm irregularly enhancing mass of the left kidney with some internal calcifications. There were enlarged left sided renal hilar lymph nodes. There were multiple pulmonary nodules in both lower lobes (largest 1.1 cm). There was a destructive bone lesion with an irregular 8.6 x 5.1 cm soft tissue mass invasive of the spinal canal narrowing at least 50% and disturbing the pedicle and portions of the left transverse process rib and T8 vertebral body.  01/15/2014:  Chest CT revealed innumerable pulmonary nodules, a large metastatic lesion at T8 and left eighth rib with invasion into the spinal canal and moderate canal stenosis. There was a left upper pole right kidney mass.  Head MRI was negative. 05/31/2015:  Chest, abdomen, and pelvic CT revealed slight enlargement of multiple pulmonary nodules.  The left renal mass was similar (12.2 x 9.6 cm).  The right kidney lesion was stable (lower pole lesion 2 x 1.7 cm).  Bone scan on 05/31/2015 revealed slight increased activity in the left costal vertebral junction at T7. 12/14/2015:  Chest, abdomen, and pelvic CT revealed an irregular 10.7 cm heterogeneous renal cortical mass in the upper left kidney and a heterogeneous 2.3 cm renal cortical mass in the lateral lower right kidney.  There was mild bilateral hydroureteronephrosis with bladder  distention.  There was mild left para-aortic lymphadenopathy.  There were numerous (greater than 20) pulmonary metastases.  Largest nodule was 2.1 cm in the RLL. There was a partially visualized mildly expansile lytic lesion in the left proximal femur status post surgical transfixation.  Thre was a small probably loculated pleural effusion at the left eighth rib resection site.  There were no other bone abnormalities. 12/24/2015:  Bone scan revealed photopenia from prior removal of the left eighth rib. There was increased  uptake in the medial thoracic spine from T7 and T9 is felt to be due to postoperative fusion.  There was increased uptake throughout much of the left femur as well as increased uptake in the soft tissues of much of the left femur which may be due to previous surgery and radiation therapy change. 03/10/2016:  Chest CT revealed unchanged bilateral pulmonary nodules and a left renal mass. 08/08/2016:  Chest, abdomen, and pelvic CT revealed decreasing left kidney lesion, stable to increase in multifocal pulmonary nodules and a similar appearance of prominent upper abdominal lymph nodes that were worrisome for metastatic adenopathy.     Appointment on 10/17/2016  Component Date Value Ref Range Status  . WBC 10/17/2016 8.4  3.8 - 10.6 K/uL Final  . RBC 10/17/2016 4.21* 4.40 - 5.90 MIL/uL Final  . Hemoglobin 10/17/2016 9.6* 13.0 - 18.0 g/dL Final  . HCT 10/17/2016 30.1* 40.0 - 52.0 % Final  . MCV 10/17/2016 71.5* 80.0 - 100.0 fL Final  . MCH 10/17/2016 22.7* 26.0 - 34.0 pg Final  . MCHC 10/17/2016 31.8* 32.0 - 36.0 g/dL Final  . RDW 10/17/2016 20.8* 11.5 - 14.5 % Final  . Platelets 10/17/2016 321  150 - 440 K/uL Final  . Neutrophils Relative % 10/17/2016 77  % Final  . Neutro Abs 10/17/2016 6.4  1.4 - 6.5 K/uL Final  . Lymphocytes Relative 10/17/2016 8  % Final  . Lymphs Abs 10/17/2016 0.7* 1.0 - 3.6 K/uL Final  . Monocytes Relative 10/17/2016 11  % Final  . Monocytes Absolute  10/17/2016 0.9  0.2 - 1.0 K/uL Final  . Eosinophils  Relative 10/17/2016 3  % Final  . Eosinophils Absolute 10/17/2016 0.3  0 - 0.7 K/uL Final  . Basophils Relative 10/17/2016 1  % Final  . Basophils Absolute 10/17/2016 0.1  0 - 0.1 K/uL Final  . Sodium 10/17/2016 138  135 - 145 mmol/L Final  . Potassium 10/17/2016 3.3* 3.5 - 5.1 mmol/L Final  . Chloride 10/17/2016 100* 101 - 111 mmol/L Final  . CO2 10/17/2016 27  22 - 32 mmol/L Final  . Glucose, Bld 10/17/2016 140* 65 - 99 mg/dL Final  . BUN 10/17/2016 19  6 - 20 mg/dL Final  . Creatinine, Ser 10/17/2016 1.09  0.61 - 1.24 mg/dL Final  . Calcium 10/17/2016 9.5  8.9 - 10.3 mg/dL Final  . Total Protein 10/17/2016 8.2* 6.5 - 8.1 g/dL Final  . Albumin 10/17/2016 3.6  3.5 - 5.0 g/dL Final  . AST 10/17/2016 17  15 - 41 U/L Final  . ALT 10/17/2016 10* 17 - 63 U/L Final  . Alkaline Phosphatase 10/17/2016 69  38 - 126 U/L Final  . Total Bilirubin 10/17/2016 0.3  0.3 - 1.2 mg/dL Final  . GFR calc non Af Amer 10/17/2016 >60  >60 mL/min Final  . GFR calc Af Amer 10/17/2016 >60  >60 mL/min Final   Comment: (NOTE) The eGFR has been calculated using the CKD EPI equation. This calculation has not been validated in all clinical situations. eGFR's persistently <60 mL/min signify possible Chronic Kidney Disease.   . Anion gap 10/17/2016 11  5 - 15 Final  . Magnesium 10/17/2016 2.1  1.7 - 2.4 mg/dL Final  . TSH 10/17/2016 3.970  0.350 - 4.500 uIU/mL Final    Assessment:  Marc Blake is a 61 y.o. male with metastatic renal cell carcinoma presenting in 12/2013.  He had a large left renal mass, rib and vertebral body involvement, and multiple pulmonary nodules.  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    He was on Votrient (pazopanib) from 02/13/2014 - 01/21/2016.  He began nivolumab on 01/28/2016.  He received 3000 cGy to T7 - T9 and associated ribs beginning 05/11/2014.  On 06/15/2014, he received  1 dose of nivolumab.  He presented with progressive paralysis from the waist down on 07/06/2014.  He underwent left posterolateral thoracotomy with left chest wall resection with T8 corpectomy and interbody fusion with donor bone on 07/06/2014.  Pathology revealed metastatic renal cell carcinoma. Post-operatively, he developed sepsis secondary to a submandibular abscess dental abscess.  He underwent tracheostomy, I&D of a right neck abscess, tooth extraction, and PEG tube placement.  Zometa has subsequently been on hold.  He developed a pathologic fracture of the left femur.  He is s/p intramedullary nailing on 01/27/2015.  He received radiation to the left leg.  He was admitted on 04/26/2015 with aspiration pneumonia and opioid overdose. He is currently taking MSContin 15 mg BID and oxycodone 5 mg q day.  He developed a left lower extremity DVT (popliteal and soleal vein) on 06/07/2015.  Left lower extremity ultrasound on 02/07/2016 revealed no evidence of DVT.  He is on long term Xarelto.  Patient has a long standing history of an obstructive uropathy and elevated PVR.  He has undergone urethral dilatation on several occasions.  He underwent cystoscopy, balloon urethral dilatation, and bladder biopsy with Foley catheter placement on 03/06/2016.  Bladder biopsy revealed cystitis with reactive changes, negative for atypia and malignancy.  He was [facility] to [facility] from  02/14/2016 - 02/16/2016 with chest pain.  Chest CT angiogram on 02/15/2016 ruled out pulmonary embolism. Imaging revealed slight progression of mediastinal left hilar adenopathy was pulmonary metastatic disease. Stress test on 02/15/2016 ruled out cardiac ischemia.  He was treated with Augmentin for presumed lower extremity cellulitis.  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  There is possible increase in change in the lytic lesion with some movement at the fracture site.  He has broken the distal locking screw.  He  ambulates with a rolling walker.  Bone scan at [facility] on 06/22/2016 revealed no definite evidence of osseous metastatic disease.  There were photopenic regions overlying the left posterior eighth rib and the proximal left femur, likely representing prior surgical resection  He underwent removal of prior rod/intramedullary nail, radical resection of left femur, resection of tumor, and left hemiarthroplasty at [facility] on 06/16/2016.  Pathology of the left proximal femur revealed metastatic carcinoma compatible with renal cell carcinoma.  He is scheduled to begin physical therapy.   He is s/p 13 cycles of nivolumab (01/28/2016 - 06/06/2016; 08/08/2016 - 10/03/2016).  He tolerated his infusions well.  TSH was 6.239 (0.35-4.50) with a normal free T4 (0.79) on 02/25/2016.  TSH was 2.713 (normal) and free T4 0.79 (normal) on 05/19/2016.  Treatment was held on 05/19/2016 secondary to diarrhea and on 05/26/2016 secondary to unexplained hypotension.   Cortisol level was 7.9 with a repeat of 10.3 (normal) on 05/29/2016 at 8:26 AM.  ACTH was 39.1 (normal) at 10 AM on 05/26/2016.  He has a progressive microcytic anemia c/w iron deficiency and anemia of chronic disease.  After his surgery at [facility] in 05/2016, he required 2 units of PRBCs.  MCV was 74.  Anemia work-up on 07/31/2016 revealed a ferritin 318 (falsely elevated secondary to ESR > 140), iron saturation 12%, TIBC 217 (low), and retic 2.5%.  He has received Venofer x 3 (08/22/2016, 09/05/2016, and 09/19/2016).  He has restrictive lung disease secondary to his weight and prior radiation.  He has been prescribed oxygen (not received).    His psoriasis flared on nivolumab, but has improved with topical steroids.  Psoriasis was back to baseline after 2 months off nivolumab.  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  Symptomatically, has new left shoulder pain limiting range of motion.  Plan: 1.  Labs today:  CBC with diff, CMP, Mg, TSH. 2.  Discuss  concern for progressive disease.  Discuss plan for restaging studies (chest, abdomen, and pelvis + bone scan).  May need further imaging of shoulder if etiology of pain not clearly defined.  Discuss postponing Nivolumab today. 3.  Chest, abdomen, and pelvic CT scan ASAP. 4.  Bone scan ASAP. 5.  RTC after above.  Addendum:  Chest, abdomen, and pelvic CT scan on 10/20/2016 revealed progressive disease with enlarging left renal mass and pulmonary nodules.  Stable lytic lesion in posterior elements of T8.  Will review with radiology for further imaging of shoulder.  Patient may be a candidate for palliative radiation.  Systemic treatment options include cabozantinib or axitinib.   [name], MD 10/17/2016, 11:59 AM

## 2016-10-17 NOTE — Progress Notes (Signed)
Patient state he is having problems with his left neck, shoulder and arm.  Unable to raise his left arm.  This is new since last visit.  Patient's temp 99.1.  States he has had some intermittent sweats/chills.

## 2016-10-18 ENCOUNTER — Telehealth: Payer: Self-pay | Admitting: *Deleted

## 2016-10-18 ENCOUNTER — Ambulatory Visit: Payer: 59

## 2016-10-18 ENCOUNTER — Encounter: Payer: 59 | Admitting: Occupational Therapy

## 2016-10-18 NOTE — Telephone Encounter (Signed)
Patient in the office yesterday and asked if he could get help with his medication due to cost?  I emailed Elease Etienne regarding this.  He replied that patient should call him next Wednesday.  Called patient and LVM for him to contact [facility] next week.

## 2016-10-20 ENCOUNTER — Ambulatory Visit: Payer: Self-pay

## 2016-10-20 ENCOUNTER — Ambulatory Visit
Admission: RE | Admit: 2016-10-20 | Discharge: 2016-10-20 | Disposition: A | Discharge status: Home / Self Care | Payer: Medicaid Other | Source: Ambulatory Visit | Attending: Hematology and Oncology | Admitting: Hematology and Oncology

## 2016-10-20 DIAGNOSIS — C7951 Secondary malignant neoplasm of bone: Secondary | ICD-10-CM | POA: Insufficient documentation

## 2016-10-20 DIAGNOSIS — C649 Malignant neoplasm of unspecified kidney, except renal pelvis: Secondary | ICD-10-CM | POA: Diagnosis present

## 2016-10-20 DIAGNOSIS — I2510 Atherosclerotic heart disease of native coronary artery without angina pectoris: Secondary | ICD-10-CM | POA: Diagnosis not present

## 2016-10-20 DIAGNOSIS — Q638 Other specified congenital malformations of kidney: Secondary | ICD-10-CM | POA: Insufficient documentation

## 2016-10-20 DIAGNOSIS — R918 Other nonspecific abnormal finding of lung field: Secondary | ICD-10-CM | POA: Insufficient documentation

## 2016-10-20 DIAGNOSIS — K429 Umbilical hernia without obstruction or gangrene: Secondary | ICD-10-CM | POA: Insufficient documentation

## 2016-10-20 DIAGNOSIS — N200 Calculus of kidney: Secondary | ICD-10-CM | POA: Diagnosis not present

## 2016-10-20 DIAGNOSIS — R590 Localized enlarged lymph nodes: Secondary | ICD-10-CM | POA: Diagnosis not present

## 2016-10-20 DIAGNOSIS — I700 Atherosclerosis of aorta: Secondary | ICD-10-CM | POA: Insufficient documentation

## 2016-10-20 DIAGNOSIS — C7800 Secondary malignant neoplasm of unspecified lung: Secondary | ICD-10-CM | POA: Insufficient documentation

## 2016-10-20 HISTORY — DX: Disorder of kidney and ureter, unspecified: N28.9

## 2016-10-20 MED ORDER — IOPAMIDOL (ISOVUE-300) INJECTION 61%
100.0000 mL | Freq: Once | INTRAVENOUS | Status: AC | PRN
  Administered 2016-10-20: 100 mL via INTRAVENOUS

## 2016-10-23 ENCOUNTER — Other Ambulatory Visit: Payer: Self-pay | Admitting: Hematology and Oncology

## 2016-10-23 ENCOUNTER — Encounter: Payer: Self-pay | Admitting: Hematology and Oncology

## 2016-10-23 ENCOUNTER — Ambulatory Visit
Admission: RE | Admit: 2016-10-23 | Discharge: 2016-10-23 | Disposition: A | Discharge status: Home / Self Care | Payer: Medicaid Other | Source: Ambulatory Visit | Attending: Hematology and Oncology | Admitting: Hematology and Oncology

## 2016-10-23 DIAGNOSIS — C649 Malignant neoplasm of unspecified kidney, except renal pelvis: Secondary | ICD-10-CM | POA: Diagnosis not present

## 2016-10-23 DIAGNOSIS — R938 Abnormal findings on diagnostic imaging of other specified body structures: Secondary | ICD-10-CM | POA: Diagnosis not present

## 2016-10-23 DIAGNOSIS — C7951 Secondary malignant neoplasm of bone: Secondary | ICD-10-CM | POA: Diagnosis not present

## 2016-10-23 DIAGNOSIS — Z5112 Encounter for antineoplastic immunotherapy: Secondary | ICD-10-CM | POA: Insufficient documentation

## 2016-10-23 DIAGNOSIS — Z7189 Other specified counseling: Secondary | ICD-10-CM | POA: Insufficient documentation

## 2016-10-23 MED ORDER — CABOZANTINIB S-MALATE 3 X 20 MG PO KIT: 60.0000 mg | PACK | Freq: Every day | ORAL | 1 refills | Status: DC

## 2016-10-23 MED ORDER — TECHNETIUM TC 99M MEDRONATE IV KIT
21.9910 | PACK | Freq: Once | INTRAVENOUS | Status: AC | PRN
  Administered 2016-10-23: 21.991 via INTRAVENOUS

## 2016-10-23 NOTE — Progress Notes (Signed)
Patient on plan of care prior to pathways. 

## 2016-10-23 NOTE — Progress Notes (Signed)
START ON PATHWAY REGIMEN - Renal Cell     Daily:     Cabozantinib (tablet)   **Always confirm dose/schedule in your pharmacy ordering system**    Patient Characteristics: Metastatic, Clear Cell, Third Line AJCC M Category: M1 AJCC 8 Stage Grouping: IV Current evidence of distant metastases? Yes AJCC T Category: [place] AJCC N Category: NX Does patient have oligometastatic disease? No Would you be surprised if this patient died  in the next year? I would be surprised if this patient died in the next year Histology: Clear Cell Line of therapy: Third Line  Intent of Therapy: Non-Curative / Palliative Intent, Discussed with Patient

## 2016-10-24 ENCOUNTER — Encounter: Payer: 59 | Admitting: Occupational Therapy

## 2016-10-24 ENCOUNTER — Ambulatory Visit: Payer: 59

## 2016-10-25 ENCOUNTER — Telehealth: Payer: Self-pay | Admitting: *Deleted

## 2016-10-25 NOTE — Telephone Encounter (Signed)
Called patient and informed him that [name] was going to start a new medication and that the company would be calling him and to check on his arm pain. Left message for patient to call me back.

## 2016-10-26 ENCOUNTER — Other Ambulatory Visit: Payer: Self-pay | Admitting: Hematology and Oncology

## 2016-10-26 ENCOUNTER — Encounter: Payer: 59 | Admitting: Occupational Therapy

## 2016-10-26 DIAGNOSIS — C649 Malignant neoplasm of unspecified kidney, except renal pelvis: Secondary | ICD-10-CM

## 2016-10-26 DIAGNOSIS — R29898 Other symptoms and signs involving the musculoskeletal system: Secondary | ICD-10-CM

## 2016-10-27 ENCOUNTER — Ambulatory Visit
Admission: RE | Admit: 2016-10-27 | Discharge: 2016-10-27 | Disposition: A | Discharge status: Home / Self Care | Payer: Medicaid Other | Source: Ambulatory Visit | Attending: Hematology and Oncology | Admitting: Hematology and Oncology

## 2016-10-27 DIAGNOSIS — C649 Malignant neoplasm of unspecified kidney, except renal pelvis: Secondary | ICD-10-CM | POA: Diagnosis present

## 2016-10-27 DIAGNOSIS — M2578 Osteophyte, vertebrae: Secondary | ICD-10-CM | POA: Insufficient documentation

## 2016-10-27 DIAGNOSIS — M47892 Other spondylosis, cervical region: Secondary | ICD-10-CM | POA: Diagnosis not present

## 2016-10-27 DIAGNOSIS — R29898 Other symptoms and signs involving the musculoskeletal system: Secondary | ICD-10-CM | POA: Diagnosis present

## 2016-10-27 DIAGNOSIS — M8448XA Pathological fracture, other site, initial encounter for fracture: Secondary | ICD-10-CM | POA: Diagnosis not present

## 2016-10-27 DIAGNOSIS — R29890 Loss of height: Secondary | ICD-10-CM | POA: Diagnosis not present

## 2016-10-31 ENCOUNTER — Inpatient Hospital Stay: Payer: Medicaid Other

## 2016-10-31 ENCOUNTER — Encounter: Payer: 59 | Admitting: Occupational Therapy

## 2016-10-31 ENCOUNTER — Ambulatory Visit: Payer: 59

## 2016-10-31 ENCOUNTER — Inpatient Hospital Stay: Payer: Medicaid Other | Admitting: Hematology and Oncology

## 2016-11-02 ENCOUNTER — Ambulatory Visit: Payer: 59

## 2016-11-02 ENCOUNTER — Encounter: Payer: 59 | Admitting: Occupational Therapy

## 2016-11-06 ENCOUNTER — Other Ambulatory Visit: Payer: Self-pay | Admitting: *Deleted

## 2016-11-06 DIAGNOSIS — C649 Malignant neoplasm of unspecified kidney, except renal pelvis: Secondary | ICD-10-CM

## 2016-11-07 ENCOUNTER — Ambulatory Visit: Payer: 59

## 2016-11-07 ENCOUNTER — Encounter: Payer: 59 | Admitting: Occupational Therapy

## 2016-11-08 ENCOUNTER — Encounter: Payer: Self-pay | Admitting: Occupational Therapy

## 2016-11-08 DIAGNOSIS — R278 Other lack of coordination: Secondary | ICD-10-CM

## 2016-11-08 DIAGNOSIS — M6281 Muscle weakness (generalized): Secondary | ICD-10-CM

## 2016-11-08 NOTE — Therapy (Signed)
[place] MAIN [facility] SERVICES [address] [place], [place], [address] Phone: [phone]   Fax:  [phone]  Nov 08, 2016     Occupational Therapy Discharge Summary   Patient: Marc Blake MRN: [id number] Date of Birth: [birth year]  Diagnosis: Muscle weakness (generalized)  Other lack of coordination  Referring Provider: Richarda Overlie  The above patient had been seen in Occupational Therapy 12 times of 20 treatments scheduled.  The treatment consisted of  ADL training, A/E training, there. Ex., neuro muscular re-ed, and pt. Education about energy conservation/work simplification strategies. The patient is: improved  Subjective:   Pt. continues to receive infusions at the [facility].  Discharge Findings: Pt. Was evaluated on 08/09/2016, and was last seen for treatment on 09/14/2016. Pt. Had improved overall, however continued to present with weakness, and incoordination. Pt.'s standing tolerance during activity is limited pain with standing ADL activity.  Functional Status at Discharge:         OT Long Term Goals - 08/09/16 1805      OT LONG TERM GOAL #1   Title Pt. will improve standing tolerance to be able to complete a standing ADL/IADL task for 11 min. safely with minimal rest breaks.   Baseline limited standing tolerance for ADLs.   Time 12   Period Weeks   Status Not met     OT LONG TERM GOAL #2   Title Pt. will demonstrate good dynamic standing balance during ADLs/IADLs    Baseline Limited   Time 12   Period Weeks   Status partially met     OT LONG TERM GOAL #3   Title Pt. will demonstrate energy conservation/work simplification techniques 100% of the time during ADL/IADL tasks.   Baseline Limited knowledge of energy conservation/work simplififcation techniques   Time 12   Period Weeks   Status Not met     OT LONG TERM GOAL #4   Title Pt. will Improve UE strength by 2 muscle grades to assist with aADL/IADLs.    Baseline Limited UE strength   Time 12   Period Weeks   Status Partially met     OT LONG TERM GOAL #5   Title Pt. will improve [facility] skills to be able to independently write legibly, and efficiently   Baseline limited legibility   Time 12   Period Weeks   Status Partially met     Long Term Additional Goals   Additional Long Term Goals Partially met         Sincerely,  Marc Carina, MS, OTR/L  CC [email or web address] @  [name] [address] [place], [place], [address] Phone: [phone]   Fax:  [phone]  Patient: Marc Blake MRN: [id number] Date of Birth: [birth year]

## 2016-11-09 ENCOUNTER — Encounter: Payer: 59 | Admitting: Occupational Therapy

## 2016-11-09 ENCOUNTER — Ambulatory Visit: Payer: 59

## 2016-11-09 ENCOUNTER — Inpatient Hospital Stay: Payer: Medicaid Other

## 2016-11-09 DIAGNOSIS — C649 Malignant neoplasm of unspecified kidney, except renal pelvis: Secondary | ICD-10-CM | POA: Diagnosis not present

## 2016-11-09 LAB — CBC WITH DIFFERENTIAL/PLATELET
Basophils Absolute: 0 10*3/uL (ref 0–0.1)
Basophils Relative: 1 %
Eosinophils Absolute: 0.3 10*3/uL (ref 0–0.7)
Eosinophils Relative: 3 %
HCT: 26.6 % — ABNORMAL LOW (ref 40.0–52.0)
Hemoglobin: 8.3 g/dL — ABNORMAL LOW (ref 13.0–18.0)
Lymphocytes Relative: 6 %
Lymphs Abs: 0.5 10*3/uL — ABNORMAL LOW (ref 1.0–3.6)
MCH: 23 pg — ABNORMAL LOW (ref 26.0–34.0)
MCHC: 31.4 g/dL — ABNORMAL LOW (ref 32.0–36.0)
MCV: 73.3 fL — ABNORMAL LOW (ref 80.0–100.0)
Monocytes Absolute: 0.7 10*3/uL (ref 0.2–1.0)
Monocytes Relative: 9 %
Neutro Abs: 6.4 10*3/uL (ref 1.4–6.5)
Neutrophils Relative %: 81 %
Platelets: 347 10*3/uL (ref 150–440)
RBC: 3.63 MIL/uL — ABNORMAL LOW (ref 4.40–5.90)
RDW: 21 % — ABNORMAL HIGH (ref 11.5–14.5)
WBC: 7.9 10*3/uL (ref 3.8–10.6)

## 2016-11-09 LAB — COMPREHENSIVE METABOLIC PANEL
ALT: 13 U/L — ABNORMAL LOW (ref 17–63)
AST: 20 U/L (ref 15–41)
Albumin: 3.3 g/dL — ABNORMAL LOW (ref 3.5–5.0)
Alkaline Phosphatase: 77 U/L (ref 38–126)
Anion gap: 7 (ref 5–15)
BUN: 19 mg/dL (ref 6–20)
CO2: 32 mmol/L (ref 22–32)
Calcium: 9.6 mg/dL (ref 8.9–10.3)
Chloride: 96 mmol/L — ABNORMAL LOW (ref 101–111)
Creatinine, Ser: 0.89 mg/dL (ref 0.61–1.24)
GFR calc Af Amer: >60 mL/min (ref >60)
GFR calc non Af Amer: >60 mL/min (ref >60)
Glucose, Bld: 155 mg/dL — ABNORMAL HIGH (ref 65–99)
Potassium: 3.7 mmol/L (ref 3.5–5.1)
Sodium: 135 mmol/L (ref 135–145)
Total Bilirubin: 0.6 mg/dL (ref 0.3–1.2)
Total Protein: 8 g/dL (ref 6.5–8.1)

## 2016-11-10 ENCOUNTER — Inpatient Hospital Stay: Payer: Medicaid Other

## 2016-11-10 ENCOUNTER — Encounter: Payer: Self-pay | Admitting: Hematology and Oncology

## 2016-11-10 ENCOUNTER — Telehealth: Payer: Self-pay | Admitting: *Deleted

## 2016-11-10 ENCOUNTER — Inpatient Hospital Stay (HOSPITAL_BASED_OUTPATIENT_CLINIC_OR_DEPARTMENT_OTHER): Payer: Medicaid Other | Admitting: Hematology and Oncology

## 2016-11-10 VITALS — BP 137/74 | HR 112 | Temp 97.3°F | Resp 18

## 2016-11-10 DIAGNOSIS — G893 Neoplasm related pain (acute) (chronic): Secondary | ICD-10-CM

## 2016-11-10 DIAGNOSIS — C642 Malignant neoplasm of left kidney, except renal pelvis: Secondary | ICD-10-CM

## 2016-11-10 DIAGNOSIS — F329 Major depressive disorder, single episode, unspecified: Secondary | ICD-10-CM

## 2016-11-10 DIAGNOSIS — C649 Malignant neoplasm of unspecified kidney, except renal pelvis: Secondary | ICD-10-CM | POA: Diagnosis not present

## 2016-11-10 DIAGNOSIS — L409 Psoriasis, unspecified: Secondary | ICD-10-CM

## 2016-11-10 DIAGNOSIS — Z79899 Other long term (current) drug therapy: Secondary | ICD-10-CM

## 2016-11-10 DIAGNOSIS — K219 Gastro-esophageal reflux disease without esophagitis: Secondary | ICD-10-CM

## 2016-11-10 DIAGNOSIS — Z8781 Personal history of (healed) traumatic fracture: Secondary | ICD-10-CM

## 2016-11-10 DIAGNOSIS — I509 Heart failure, unspecified: Secondary | ICD-10-CM

## 2016-11-10 DIAGNOSIS — Z85828 Personal history of other malignant neoplasm of skin: Secondary | ICD-10-CM

## 2016-11-10 DIAGNOSIS — M1990 Unspecified osteoarthritis, unspecified site: Secondary | ICD-10-CM

## 2016-11-10 DIAGNOSIS — R630 Anorexia: Secondary | ICD-10-CM

## 2016-11-10 DIAGNOSIS — D509 Iron deficiency anemia, unspecified: Secondary | ICD-10-CM

## 2016-11-10 DIAGNOSIS — Z86718 Personal history of other venous thrombosis and embolism: Secondary | ICD-10-CM

## 2016-11-10 DIAGNOSIS — Z8701 Personal history of pneumonia (recurrent): Secondary | ICD-10-CM

## 2016-11-10 DIAGNOSIS — I10 Essential (primary) hypertension: Secondary | ICD-10-CM

## 2016-11-10 DIAGNOSIS — R531 Weakness: Secondary | ICD-10-CM

## 2016-11-10 DIAGNOSIS — F419 Anxiety disorder, unspecified: Secondary | ICD-10-CM

## 2016-11-10 DIAGNOSIS — Z7189 Other specified counseling: Secondary | ICD-10-CM

## 2016-11-10 DIAGNOSIS — E785 Hyperlipidemia, unspecified: Secondary | ICD-10-CM

## 2016-11-10 DIAGNOSIS — Z923 Personal history of irradiation: Secondary | ICD-10-CM

## 2016-11-10 DIAGNOSIS — Z9221 Personal history of antineoplastic chemotherapy: Secondary | ICD-10-CM

## 2016-11-10 DIAGNOSIS — C7800 Secondary malignant neoplasm of unspecified lung: Secondary | ICD-10-CM

## 2016-11-10 DIAGNOSIS — D649 Anemia, unspecified: Secondary | ICD-10-CM

## 2016-11-10 DIAGNOSIS — C7951 Secondary malignant neoplasm of bone: Secondary | ICD-10-CM

## 2016-11-10 MED ORDER — SODIUM CHLORIDE 0.9 % IV SOLN: 200.0000 mg | Freq: Once | INTRAVENOUS | Status: DC

## 2016-11-10 MED ORDER — IRON SUCROSE 20 MG/ML IV SOLN
200.0000 mg | Freq: Once | INTRAVENOUS | Status: AC
  Administered 2016-11-10: 200 mg via INTRAVENOUS
  Filled 2016-11-10: qty 10

## 2016-11-10 MED ORDER — LORAZEPAM 0.5 MG PO TABS: 0.5000 mg | ORAL_TABLET | Freq: Three times a day (TID) | ORAL | 0 refills | Status: DC

## 2016-11-10 MED ORDER — SODIUM CHLORIDE 0.9 % IV SOLN
Freq: Once | INTRAVENOUS | Status: AC
  Administered 2016-11-10: 13:00:00 via INTRAVENOUS
  Filled 2016-11-10: qty 1000

## 2016-11-10 MED ORDER — MORPHINE SULFATE ER 30 MG PO TBCR: 30.0000 mg | EXTENDED_RELEASE_TABLET | Freq: Three times a day (TID) | ORAL | 0 refills | Status: DC

## 2016-11-10 NOTE — Progress Notes (Signed)
Patient is having panic attacks. States the neck brace gives him anxiety.  Also states his ribs hurt and when he is having the pain he feels SOB.  Appetite not so good right now. Sleeping better.

## 2016-11-10 NOTE — Progress Notes (Signed)
[facility] day:  11/10/2016   Chief Complaint: Marc Blake is a 61 y.o. male with metastatic renal cell carcinoma who is seen for reassessment after interval hospitalization at [facility].  HPI: The patient was last seen in the medical oncology clinic on 10/17/2016.  At that time, he noted new problems with his neck, left shoulder and arm.  He was unable to lift his left arm above his head.  Restaging studies were ordered.  Chest, abdomen, and pelvic CT scan on 10/20/2016 revealed progressive disease with enlarging left renal mass and pulmonary nodules.  Stable lytic lesion in posterior elements of T8.  Bone scan on 10/23/2016 revealed activity identified in the region of prior thoracic spine surgery. Local progression of concern.  There was uptake in the posterior left ninth rib is new since 12/23/2015 corresponds to lytic lesion on recent CT. This is compatible with metastatic disease.  There was uptake in the distal left femur concerning for metastatic involvement.  There was anterior left seventh rib activity also concerning for metastatic deposit.  MRI of the cervical spine on 10/27/2016 revealed C4 body fracture with up to 75% height loss, likely pathologic.  There was no visible epidural or foraminal infiltration, but post-contrast imaging would be more sensitive.  On the symptomatic left side there is C4-5 and C5-6 left foraminal narrowing.  On the right, there is degenerative C3-4 and C6-7 foraminal impingement.  There was diffusely patent spinal canal.  Imaging was reviewed with Dr. Meade Maw, [place] neurosurgeon.  Decision was made to transfer the patient to [facility] for surgical intervention.  He was admitted to [facility] from 10/28/2016 - 11/03/2016.  MRI on 10/28/2016 showed C4 pathologic fracture with minimal retropulsion and moderate canal stenosis. New enhancing soft tissue lesion in posterior elements of T8 causing anterior displacement with  severe stenosis without cord signal abnormality. He was taken for angiogram on 10/30/2016 which showed feeding vessels from bilateral vertebral arteries and right thyrocervical trunk, no feeder vessels for T8 lesion.   On 10/30/2016, he underwent  C4 corpectomy with C3-5 plating, T6-T10 PSF with tumor decompression, and T8 laminectomy. There were no intraoperative complications and patient tolerated the procedure well. Pathology of the T8 lesion and C4 lesion confirmed renal cell carcinoma.  CBC on 11/01/2016 revealed a hematocrit 26.7, hemoglobin 7.3, MCV 81, platelets 214,000, white count 7200.  Symptomatically, he is not doing well. He feels worse than he did before surgery. Yesterday staples were removed. He only is pain-free if he has "flat on his back".   Past Medical History:  Diagnosis Date  . Acid reflux   . Anxiety   . Arthritis   . CHF (congestive heart failure) ([place])   . Depression   . Depression   . Hyperlipidemia   . Hypertension   . Myocardial infarction ([place])   . Renal cancer ([place])   . Renal cell carcinoma ([place])   . Renal insufficiency   . Skin cancer     Past Surgical History:  Procedure Laterality Date  . BACK SURGERY    . CARDIAC CATHETERIZATION     with stent  . CYSTOSCOPY WITH BIOPSY  03/06/2016   Procedure: CYSTOSCOPY WITH BIOPSY;  Surgeon: [name], MD;  Location: [facility] ORS;  Service: Urology;;  . [name] WITH URETHRAL DILATATION N/A 03/06/2016   Procedure: CYSTOSCOPY WITH URETHRAL DILATATION WITH CATHETER PLACEMENT;  Surgeon: [name], MD;  Location: [facility] ORS;  Service: Urology;  Laterality: N/A;  .  KNEE SURGERY Left   . LEG SURGERY Left   . TONSILLECTOMY      Family History  Problem Relation Age of Onset  . Hypertension Father   . Heart attack Father   . Stroke Sister   . Hypertension Brother   . Stroke Maternal Uncle     Social History:  reports that he has never smoked. He has never used smokeless tobacco. He reports that he does  not drink alcohol or use drugs.  He is originally from [place].  He then lived in [place] for 17 years.  He moved to [place] with his wife.  He lost his job of 35 years in early 09/2016.  The patient is accompanied by his wife today.  Allergies: No Known Allergies  Current Medications: Current Outpatient Prescriptions  Medication Sig Dispense Refill  . busPIRone (BUSPAR) 7.5 MG tablet     . [employer] STOOL SOFTENER 8.6-50 MG tablet TAKE 1 TABLET BY MOUTH TWICE A DAY AS NEEDED CONSTIPATION  0  . fluocinonide cream (LIDEX) 0.05 %     . gabapentin (NEURONTIN) 300 MG capsule Take 300 mg by mouth 3 (three) times daily.     Marland Kitchen morphine (MS CONTIN) 30 MG 12 hr tablet     . oxyCODONE (OXY IR/ROXICODONE) 5 MG immediate release tablet Take 10 mg by mouth every 6 (six) hours as needed (every 6 h prn).     . senna (SENOKOT) 8.6 MG tablet Take 2 tablets by mouth at bedtime.     . sertraline (ZOLOFT) 100 MG tablet Take 100 mg by mouth at bedtime.  1  . traZODone (DESYREL) 50 MG tablet Take 50 mg by mouth at bedtime.   3  . triamcinolone cream (KENALOG) 0.5 % Apply topically 2 (two) times daily. 30 g 1  . amLODipine (NORVASC) 10 MG tablet Take 10 mg by mouth daily.  3  . atorvastatin (LIPITOR) 20 MG tablet TAKE 1 TABLET ONCE A DAY (AT BEDTIME) FOR 30 DAYS  2  . cabozantinib s-malate (COMETRIQ) capsule (3 x 25m kit) Take 60 mg by mouth daily. (Patient not taking: Reported on 11/10/2016) 1 kit 1  . carvedilol (COREG) 12.5 MG tablet TAKE 1 TABLET BY MOUTH TWICE A DAY FOR 30 DAYS  3  . [employer] GENTLE LAXATIVE 10 MG suppository INSERT 1 SUPPOSITORY RECTALLY EVERY 24HRS AS NEEDED FOR CONSTIPATION  0  . ibuprofen (ADVIL,MOTRIN) 200 MG tablet Take 200 mg by mouth every 8 (eight) hours as needed for moderate pain. 4 every 8 hours    . ipratropium-albuterol (DUONEB) 0.5-2.5 (3) MG/3ML SOLN     . lisinopril (PRINIVIL,ZESTRIL) 10 MG tablet     . Nivolumab (OPDIVO IV) Inject into the vein.    . potassium chloride SA  (K-DUR,KLOR-CON) 20 MEQ tablet Take 1 tablet (20 mEq total) by mouth daily. For 3 days (Patient not taking: Reported on 11/10/2016) 30 tablet 0  . tenofovir (VIREAD) 300 MG tablet Take 1 tablet (300 mg total) by mouth daily. (Patient not taking: Reported on 11/10/2016) 30 tablet 11  . XARELTO 20 MG TABS tablet Take 20 mg by mouth daily.  1   Current Facility-Administered Medications  Medication Dose Route Frequency Provider Last Rate Last Dose  . 0.9 %  sodium chloride infusion   Intravenous Once CLequita Asal MD        Review of Systems:  GENERAL:  Feels "worse than before surgery".  No fevers or sweats.   PERFORMANCE STATUS (ECOG): 2 HEENT:  No visual changes, runny nose, sore throat, mouth sores or tenderness.  Waiting on decision to fix teeth. Lungs:  Shortness of breath on exertion.  No cough or wheezing.  No hemoptysis. Cardiac:  No chest pain, palpitations, orthopnea, or PND. GI:  Chronic constipation.  No nausea, vomiting, diarrhea, melena or hematochezia. GU:  No urgency, frequency, dysuria, or hematuria. Self caths 3-4 x/week. Musculoskeletal:  Left shoulder pain.  Rib pain.  No muscle tenderness. Extremities:  Chronic left lower extremity swelling. Skin:  Eczema on elbows and back, mild. Neuro:  Poor memory.  No focal numbness.  Decreased movement in left shoulder.  No headache.  Decreased feeling left leg (chronic). Endocrine:  No diabetes, thyroid issues, hot flashes or night sweats. Psych:  Struggling with not working.  Panic attacks.  Neck brace causes anxiety. Pain: Back pain. Pain 10 out of 10. Review of systems:  All other systems reviewed and found to be negative.  Physical Exam: Blood pressure 137/74, pulse (!) 112, temperature 97.3 F (36.3 C), temperature source Tympanic, resp. rate 18. GENERAL:  Fatigued and frustarted appearing gentleman sitting comfortably in a wheelchair in the exam room in no acute distress.  MENTAL STATUS:  Alert and oriented to person,  place and time. HEAD:  Long gray hair with pony tail.  Goatee.  Normocephalic, atraumatic, face symmetric, no Cushingoid features. EYES:  Pupils equal round and reactive to light and accomodation.  No conjunctivitis or scleral icterus. ENT:  Oropharynx clear without lesion.  Poor dentition.  Tongue normal. Mucous membranes moist.  NECK:  Brace in place. RESPIRATORY:  Clear to auscultation without rales, wheezes or rhonchi. CARDIOVASCULAR:  Regular rate and rhythm without murmur, rub or gallop.  No JVD. ABDOMEN:  Soft, non-tender, with active bowel sounds, and no hepatosplenomegaly.  No masses. SKIN:  Tattoos. Eczema on dorsal surface of forearms and lower back, minimal. EXTREMITIES:  No skin discoloration or tenderness.  No palpable cords. LYMPH NODES: No palpable cervical, supraclavicular, axillary or inguinal adenopathy  NEUROLOGICAL:  Decreased ROM left arm.  Grips strength symmetric. PSYCH:  Appropriate.  . Imaging studies: 12/28/2013:  Abdominal and pelvic CT scan revealed an 8.5 x 10.2 x 9.2 cm irregularly enhancing mass of the left kidney with some internal calcifications. There were enlarged left sided renal hilar lymph nodes. There were multiple pulmonary nodules in both lower lobes (largest 1.1 cm). There was a destructive bone lesion with an irregular 8.6 x 5.1 cm soft tissue mass invasive of the spinal canal narrowing at least 50% and disturbing the pedicle and portions of the left transverse process rib and T8 vertebral body.  01/15/2014:  Chest CT revealed innumerable pulmonary nodules, a large metastatic lesion at T8 and left eighth rib with invasion into the spinal canal and moderate canal stenosis. There was a left upper pole right kidney mass.  Head MRI was negative. 05/31/2015:  Chest, abdomen, and pelvic CT revealed slight enlargement of multiple pulmonary nodules.  The left renal mass was similar (12.2 x 9.6 cm).  The right kidney lesion was stable (lower pole lesion 2 x 1.7  cm).  Bone scan on 05/31/2015 revealed slight increased activity in the left costal vertebral junction at T7. 12/14/2015:  Chest, abdomen, and pelvic CT revealed an irregular 10.7 cm heterogeneous renal cortical mass in the upper left kidney and a heterogeneous 2.3 cm renal cortical mass in the lateral lower right kidney.  There was mild bilateral hydroureteronephrosis with bladder distention.  There was mild left para-aortic lymphadenopathy.  There were numerous (greater than 20) pulmonary metastases.  Largest nodule was 2.1 cm in the RLL. There was a partially visualized mildly expansile lytic lesion in the left proximal femur status post surgical transfixation.  Thre was a small probably loculated pleural effusion at the left eighth rib resection site.  There were no other bone abnormalities. 12/24/2015:  Bone scan revealed photopenia from prior removal of the left eighth rib. There was increased  uptake in the medial thoracic spine from T7 and T9 is felt to be due to postoperative fusion.  There was increased uptake throughout much of the left femur as well as increased uptake in the soft tissues of much of the left femur which may be due to previous surgery and radiation therapy change. 03/10/2016:  Chest CT revealed unchanged bilateral pulmonary nodules and a left renal mass. 08/08/2016:  Chest, abdomen, and pelvic CT revealed decreasing left kidney lesion, stable to increase in multifocal pulmonary nodules and a similar appearance of prominent upper abdominal lymph nodes that were worrisome for metastatic adenopathy.   10/20/2016:  Chest, abdomen, and pelvic CT revealed progressive disease with enlarging left renal mass and pulmonary nodules.  Stable lytic lesion in posterior elements of T8.   10/23/2016:  Bone scan revealed activity identified in the region of prior thoracic spine surgery. Local progression of concern.  There was uptake in the posterior left ninth rib was new since 12/23/2015  corresponds to lytic lesion on recent CT. This is compatible with metastatic disease.  There was uptake in the distal left femur concerning for metastatic involvement.  There was anterior left seventh rib activity also concerning for metastatic deposit. 10/27/2016:  MRI of the cervical spine revealed C4 body fracture with up to 75% height loss, likely pathologic.  There was no visible epidural or foraminal infiltration, but post-contrast imaging would be more sensitive.  On the symptomatic left side there is C4-5 and C5-6 left foraminal narrowing.  On the right, there is degenerative C3-4 and C6-7 foraminal impingement.  There was diffusely patent spinal canal. 10/28/2016: MRI at [facility] revealed C4 pathologic fracture with minimal retropulsion and moderate canal stenosis. New enhancing soft tissue lesion in posterior elements of T8 causing anterior displacement with severe stenosis without cord signal abnormality.   Appointment on 11/09/2016  Component Date Value Ref Range Status  . WBC 11/09/2016 7.9  3.8 - 10.6 K/uL Final  . RBC 11/09/2016 3.63* 4.40 - 5.90 MIL/uL Final  . Hemoglobin 11/09/2016 8.3* 13.0 - 18.0 g/dL Final  . HCT 11/09/2016 26.6* 40.0 - 52.0 % Final  . MCV 11/09/2016 73.3* 80.0 - 100.0 fL Final  . MCH 11/09/2016 23.0* 26.0 - 34.0 pg Final  . MCHC 11/09/2016 31.4* 32.0 - 36.0 g/dL Final  . RDW 11/09/2016 21.0* 11.5 - 14.5 % Final  . Platelets 11/09/2016 347  150 - 440 K/uL Final  . Neutrophils Relative % 11/09/2016 81  % Final  . Neutro Abs 11/09/2016 6.4  1.4 - 6.5 K/uL Final  . Lymphocytes Relative 11/09/2016 6  % Final  . Lymphs Abs 11/09/2016 0.5* 1.0 - 3.6 K/uL Final  . Monocytes Relative 11/09/2016 9  % Final  . Monocytes Absolute 11/09/2016 0.7  0.2 - 1.0 K/uL Final  . Eosinophils Relative 11/09/2016 3  % Final  . Eosinophils Absolute 11/09/2016 0.3  0 - 0.7 K/uL Final  . Basophils Relative 11/09/2016 1  % Final  . Basophils Absolute 11/09/2016 0.0  0 - 0.1 K/uL Final   . Sodium  11/09/2016 135  135 - 145 mmol/L Final  . Potassium 11/09/2016 3.7  3.5 - 5.1 mmol/L Final  . Chloride 11/09/2016 96* 101 - 111 mmol/L Final  . CO2 11/09/2016 32  22 - 32 mmol/L Final  . Glucose, Bld 11/09/2016 155* 65 - 99 mg/dL Final  . BUN 11/09/2016 19  6 - 20 mg/dL Final  . Creatinine, Ser 11/09/2016 0.89  0.61 - 1.24 mg/dL Final  . Calcium 11/09/2016 9.6  8.9 - 10.3 mg/dL Final  . Total Protein 11/09/2016 8.0  6.5 - 8.1 g/dL Final  . Albumin 11/09/2016 3.3* 3.5 - 5.0 g/dL Final  . AST 11/09/2016 20  15 - 41 U/L Final  . ALT 11/09/2016 13* 17 - 63 U/L Final  . Alkaline Phosphatase 11/09/2016 77  38 - 126 U/L Final  . Total Bilirubin 11/09/2016 0.6  0.3 - 1.2 mg/dL Final  . GFR calc non Af Amer 11/09/2016 >60  >60 mL/min Final  . GFR calc Af Amer 11/09/2016 >60  >60 mL/min Final   Comment: (NOTE) The eGFR has been calculated using the CKD EPI equation. This calculation has not been validated in all clinical situations. eGFR's persistently <60 mL/min signify possible Chronic Kidney Disease.   . Anion gap 11/09/2016 7  5 - 15 Final    Assessment:  MACALLISTER ASHMEAD is a 61 y.o. male with metastatic renal cell carcinoma presenting in 12/2013.  He had a large left renal mass, rib and vertebral body involvement, and multiple pulmonary nodules.  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    He was on Votrient (pazopanib) from 02/13/2014 - 01/21/2016.  He began nivolumab on 01/28/2016.  He received 3000 cGy to T7 - T9 and associated ribs beginning 05/11/2014.  On 06/15/2014, he received 1 dose of nivolumab.  He presented with progressive paralysis from the waist down on 07/06/2014.  He underwent left posterolateral thoracotomy with left chest wall resection with T8 corpectomy and interbody fusion with donor bone on 07/06/2014.  Pathology revealed metastatic renal cell carcinoma. Post-operatively, he developed sepsis secondary to a  submandibular abscess dental abscess.  He underwent tracheostomy, I&D of a right neck abscess, tooth extraction, and PEG tube placement.  Zometa has subsequently been on hold.  He developed a pathologic fracture of the left femur.  He is s/p intramedullary nailing on 01/27/2015.  He received radiation to the left leg.  He was admitted on 04/26/2015 with aspiration pneumonia and opioid overdose. He is currently taking MSContin 15 mg BID and oxycodone 5 mg q day.  He developed a left lower extremity DVT (popliteal and soleal vein) on 06/07/2015.  Left lower extremity ultrasound on 02/07/2016 revealed no evidence of DVT.  He is on long term Xarelto.  Patient has a long standing history of an obstructive uropathy and elevated PVR.  He has undergone urethral dilatation on several occasions.  He underwent cystoscopy, balloon urethral dilatation, and bladder biopsy with Foley catheter placement on 03/06/2016.  Bladder biopsy revealed cystitis with reactive changes, negative for atypia and malignancy.  He was admitted to [facility] from 02/14/2016 - 02/16/2016 with chest pain.  Chest CT angiogram on 02/15/2016 ruled out pulmonary embolism. Imaging revealed slight progression of mediastinal left hilar adenopathy was pulmonary metastatic disease. Stress test on 02/15/2016 ruled out cardiac ischemia.  He was treated with Augmentin for presumed lower extremity cellulitis.  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  There is  possible increase in change in the lytic lesion with some movement at the fracture site.  He has broken the distal locking screw.  He ambulates with a rolling walker.  Bone scan at [facility] on 06/22/2016 revealed no definite evidence of osseous metastatic disease.  There were photopenic regions overlying the left posterior eighth rib and the proximal left femur, likely representing prior surgical resection  He underwent removal of prior rod/intramedullary nail, radical resection of left  femur, resection of tumor, and left hemiarthroplasty at [facility] on 06/16/2016.  Pathology of the left proximal femur revealed metastatic carcinoma compatible with renal cell carcinoma.  He is scheduled to begin physical therapy.   He received 13 cycles of nivolumab (01/28/2016 - 06/06/2016; 08/08/2016 - 10/03/2016).  He tolerated his infusions well.  TSH was 6.239 (0.35-4.50) with a normal free T4 (0.79) on 02/25/2016.  TSH was 2.713 (normal) and free T4 0.79 (normal) on 05/19/2016.  Treatment was held on 05/19/2016 secondary to diarrhea and on 05/26/2016 secondary to unexplained hypotension.   Cortisol level was 7.9 with a repeat of 10.3 (normal) on 05/29/2016 at 8:26 AM.  ACTH was 39.1 (normal) at 10 AM on 05/26/2016.  Chest, abdomen, and pelvic CT on 10/20/2016 revealed progressive disease with enlarging left renal mass and pulmonary nodules.  Stable lytic lesion in posterior elements of T8.  Bone scan on 10/23/2016 revealed activity identified in the region of prior thoracic spine surgery. Local progression of concern.  There was uptake in the posterior left ninth rib is new since 12/23/2015 corresponds to lytic lesion on recent CT. This is compatible with metastatic disease.  There was uptake in the distal left femur concerning for metastatic involvement.  There was anterior left seventh rib activity also concerning for metastatic deposit.  MRI of the cervical spine on 10/27/2016 revealed C4 body fracture with up to 75% height loss, likely pathologic.  There was no visible epidural or foraminal infiltration, but post-contrast imaging would be more sensitive.  On the symptomatic left side there is C4-5 and C5-6 left foraminal narrowing.  On the right, there is degenerative C3-4 and C6-7 foraminal impingement.  There was diffusely patent spinal canal.  He underwent  C4 corpectomy with C3-5 plating, T6-T10 PSF with tumor decompression, and T8 laminectomy on 10/30/2016.  Pathology of the T8 lesion and C4  lesion confirmed renal cell carcinoma.  He developed progressive microcytic anemia c/w iron deficiency and anemia of chronic disease.  After his surgery at [facility] in 05/2016, he required 2 units of PRBCs.  MCV was 74.  Anemia work-up on 07/31/2016 revealed a ferritin 318 (falsely elevated secondary to ESR > 140), iron saturation 12%, TIBC 217 (low), and retic 2.5%.  He has received Venofer x 3 (08/22/2016, 09/05/2016, and 09/19/2016) with improvement in his hematocrit.  He has restrictive lung disease secondary to his weight and prior radiation.  He has been prescribed oxygen (not received).    His psoriasis flared on nivolumab, but has improved with topical steroids.  Psoriasis was back to baseline after 2 months off nivolumab.  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  Symptomatically, he has post-operative pain and anxiety.  Plan: 1.  Review interval imaging studies and surgical procedure at [facility].  Discuss referral to radiation oncology.  Discuss initiation of cabozantinib (Cometriq) after clearance from surgery. 2.  Nurse to contact Dr Lacinda Axon. 3.  Discuss Xegeva and patient's delay secondary to dental issues.  Discuss risks and benefits. 4.  Discuss anemia likely due  to iron deficiency anemia and chronic disease.  Discuss trial of Venofer. 5.  Venofer today 6.  Rx:  MSContin 30 mg po q 12 hrs 7.  Rx:  Ativan 0.5 mg po q 6 hrs prn. 8.  Consult radiation oncology 9.  RTC in 1 week for MD assessment, labs (CBC with diff) +/- Venofer   [name], MD 11/10/2016, 12:07 PM

## 2016-11-10 NOTE — Telephone Encounter (Signed)
Patient and wife given chemo teaching r/t cabometyx, book given to patient, voiced understanding, instructed to call with questions.

## 2016-11-14 ENCOUNTER — Encounter: Payer: 59 | Admitting: Occupational Therapy

## 2016-11-14 ENCOUNTER — Ambulatory Visit: Payer: 59

## 2016-11-16 ENCOUNTER — Ambulatory Visit: Payer: 59

## 2016-11-16 ENCOUNTER — Encounter: Payer: 59 | Admitting: Occupational Therapy

## 2016-11-17 ENCOUNTER — Inpatient Hospital Stay: Payer: Medicaid Other

## 2016-11-17 ENCOUNTER — Inpatient Hospital Stay: Payer: Medicaid Other | Attending: Hematology and Oncology

## 2016-11-17 ENCOUNTER — Inpatient Hospital Stay (HOSPITAL_BASED_OUTPATIENT_CLINIC_OR_DEPARTMENT_OTHER): Payer: Medicaid Other | Admitting: Hematology and Oncology

## 2016-11-17 ENCOUNTER — Other Ambulatory Visit: Payer: Self-pay | Admitting: Hematology and Oncology

## 2016-11-17 VITALS — BP 113/77 | HR 88 | Temp 98.3°F | Resp 18 | Wt 222.4 lb

## 2016-11-17 DIAGNOSIS — K219 Gastro-esophageal reflux disease without esophagitis: Secondary | ICD-10-CM | POA: Insufficient documentation

## 2016-11-17 DIAGNOSIS — Z8781 Personal history of (healed) traumatic fracture: Secondary | ICD-10-CM | POA: Insufficient documentation

## 2016-11-17 DIAGNOSIS — F329 Major depressive disorder, single episode, unspecified: Secondary | ICD-10-CM | POA: Diagnosis not present

## 2016-11-17 DIAGNOSIS — Z598 Other problems related to housing and economic circumstances: Secondary | ICD-10-CM

## 2016-11-17 DIAGNOSIS — Z8701 Personal history of pneumonia (recurrent): Secondary | ICD-10-CM | POA: Insufficient documentation

## 2016-11-17 DIAGNOSIS — I10 Essential (primary) hypertension: Secondary | ICD-10-CM | POA: Diagnosis not present

## 2016-11-17 DIAGNOSIS — C649 Malignant neoplasm of unspecified kidney, except renal pelvis: Secondary | ICD-10-CM | POA: Diagnosis not present

## 2016-11-17 DIAGNOSIS — C642 Malignant neoplasm of left kidney, except renal pelvis: Secondary | ICD-10-CM

## 2016-11-17 DIAGNOSIS — Z923 Personal history of irradiation: Secondary | ICD-10-CM | POA: Diagnosis not present

## 2016-11-17 DIAGNOSIS — G893 Neoplasm related pain (acute) (chronic): Secondary | ICD-10-CM

## 2016-11-17 DIAGNOSIS — C7951 Secondary malignant neoplasm of bone: Secondary | ICD-10-CM | POA: Diagnosis not present

## 2016-11-17 DIAGNOSIS — R531 Weakness: Secondary | ICD-10-CM | POA: Insufficient documentation

## 2016-11-17 DIAGNOSIS — E785 Hyperlipidemia, unspecified: Secondary | ICD-10-CM | POA: Diagnosis not present

## 2016-11-17 DIAGNOSIS — Z85828 Personal history of other malignant neoplasm of skin: Secondary | ICD-10-CM

## 2016-11-17 DIAGNOSIS — R630 Anorexia: Secondary | ICD-10-CM | POA: Diagnosis not present

## 2016-11-17 DIAGNOSIS — Z9221 Personal history of antineoplastic chemotherapy: Secondary | ICD-10-CM | POA: Insufficient documentation

## 2016-11-17 DIAGNOSIS — J984 Other disorders of lung: Secondary | ICD-10-CM

## 2016-11-17 DIAGNOSIS — D509 Iron deficiency anemia, unspecified: Secondary | ICD-10-CM | POA: Diagnosis not present

## 2016-11-17 DIAGNOSIS — L409 Psoriasis, unspecified: Secondary | ICD-10-CM | POA: Insufficient documentation

## 2016-11-17 DIAGNOSIS — Z79899 Other long term (current) drug therapy: Secondary | ICD-10-CM

## 2016-11-17 DIAGNOSIS — C7800 Secondary malignant neoplasm of unspecified lung: Secondary | ICD-10-CM

## 2016-11-17 DIAGNOSIS — F419 Anxiety disorder, unspecified: Secondary | ICD-10-CM | POA: Diagnosis not present

## 2016-11-17 DIAGNOSIS — I509 Heart failure, unspecified: Secondary | ICD-10-CM | POA: Insufficient documentation

## 2016-11-17 DIAGNOSIS — D649 Anemia, unspecified: Secondary | ICD-10-CM | POA: Diagnosis not present

## 2016-11-17 DIAGNOSIS — M1990 Unspecified osteoarthritis, unspecified site: Secondary | ICD-10-CM

## 2016-11-17 DIAGNOSIS — I252 Old myocardial infarction: Secondary | ICD-10-CM

## 2016-11-17 DIAGNOSIS — M79602 Pain in left arm: Secondary | ICD-10-CM

## 2016-11-17 DIAGNOSIS — Z7189 Other specified counseling: Secondary | ICD-10-CM

## 2016-11-17 DIAGNOSIS — Z86718 Personal history of other venous thrombosis and embolism: Secondary | ICD-10-CM | POA: Insufficient documentation

## 2016-11-17 DIAGNOSIS — R110 Nausea: Secondary | ICD-10-CM

## 2016-11-17 LAB — CBC WITH DIFFERENTIAL/PLATELET
Basophils Absolute: 0.1 10*3/uL (ref 0–0.1)
Basophils Relative: 1 %
Eosinophils Absolute: 0.3 10*3/uL (ref 0–0.7)
Eosinophils Relative: 4 %
HCT: 28.6 % — ABNORMAL LOW (ref 40.0–52.0)
Hemoglobin: 9 g/dL — ABNORMAL LOW (ref 13.0–18.0)
Lymphocytes Relative: 10 %
Lymphs Abs: 0.7 10*3/uL — ABNORMAL LOW (ref 1.0–3.6)
MCH: 23.2 pg — ABNORMAL LOW (ref 26.0–34.0)
MCHC: 31.3 g/dL — ABNORMAL LOW (ref 32.0–36.0)
MCV: 74.1 fL — ABNORMAL LOW (ref 80.0–100.0)
Monocytes Absolute: 0.7 10*3/uL (ref 0.2–1.0)
Monocytes Relative: 10 %
Neutro Abs: 5.3 10*3/uL (ref 1.4–6.5)
Neutrophils Relative %: 75 %
Platelets: 333 10*3/uL (ref 150–440)
RBC: 3.87 MIL/uL — ABNORMAL LOW (ref 4.40–5.90)
RDW: 21.4 % — ABNORMAL HIGH (ref 11.5–14.5)
WBC: 7 10*3/uL (ref 3.8–10.6)

## 2016-11-17 MED ORDER — IRON SUCROSE 20 MG/ML IV SOLN
200.0000 mg | Freq: Once | INTRAVENOUS | Status: AC
  Administered 2016-11-17: 200 mg via INTRAVENOUS
  Filled 2016-11-17: qty 10

## 2016-11-17 MED ORDER — SODIUM CHLORIDE 0.9 % IV SOLN
Freq: Once | INTRAVENOUS | Status: AC
  Administered 2016-11-17: 15:00:00 via INTRAVENOUS
  Filled 2016-11-17: qty 1000

## 2016-11-17 NOTE — Progress Notes (Signed)
Patient continues to have back pain.  States the straighter he sits the better he can breathe.  Appetite is not so good.

## 2016-11-17 NOTE — Progress Notes (Signed)
[name] day:  11/17/2016  Chief Complaint: Marc Blake is a 61 y.o. male with metastatic renal cell carcinoma who is seen for 1 week assessment, continuation of weekly Venofer, and initiation of cabozantinib (Cometriq).  HPI: The patient was last seen in the medical oncology clinic on 11/10/2016.  At that time, he was seen for reassessment after surgery at [facility].  He had significant pain and anxiety.  He had ongoing anemia felt secondary to anemia of chronic disease and iron deficiency.  He received prescriptions for MS Contin and Ativan.  He received Venofer.  He received chemotherapy teaching for cabozantinib (Cometriq).  Radiation oncology was consulted.  Symptomatically, he notes that he can lay flat in bed and have no pain. He can raise his arm "so far up". His left arm is still weak. He states that he can't see his feet secondary to the brace. He remains frustrated. Appetite is poor.   Past Medical History:  Diagnosis Date  . Acid reflux   . Anxiety   . Arthritis   . CHF (congestive heart failure) ([place])   . Depression   . Depression   . Hyperlipidemia   . Hypertension   . Myocardial infarction ([place])   . Renal cancer ([place])   . Renal cell carcinoma ([place])   . Renal insufficiency   . Skin cancer     Past Surgical History:  Procedure Laterality Date  . BACK SURGERY    . CARDIAC CATHETERIZATION     with stent  . CYSTOSCOPY WITH BIOPSY  03/06/2016   Procedure: CYSTOSCOPY WITH BIOPSY;  Surgeon: [name], MD;  Location: [facility] ORS;  Service: Urology;;  . [name] WITH URETHRAL DILATATION N/A 03/06/2016   Procedure: CYSTOSCOPY WITH URETHRAL DILATATION WITH CATHETER PLACEMENT;  Surgeon: [name], MD;  Location: [facility] ORS;  Service: Urology;  Laterality: N/A;  . KNEE SURGERY Left   . LEG SURGERY Left   . TONSILLECTOMY      Family History  Problem Relation Age of Onset  . Hypertension Father   . Heart attack Father    . Stroke Sister   . Hypertension Brother   . Stroke Maternal Uncle     Social History:  reports that he has never smoked. He has never used smokeless tobacco. He reports that he does not drink alcohol or use drugs.  He is originally from [place].  He then lived in [place] for 17 years.  He moved to [place] with his wife.  He lost his job of 35 years in early 09/2016.   Allergies: No Known Allergies  Current Medications: Current Outpatient Prescriptions  Medication Sig Dispense Refill  . amLODipine (NORVASC) 10 MG tablet Take 10 mg by mouth daily.  3  . atorvastatin (LIPITOR) 20 MG tablet TAKE 1 TABLET ONCE A DAY (AT BEDTIME) FOR 30 DAYS  2  . busPIRone (BUSPAR) 7.5 MG tablet     . carvedilol (COREG) 12.5 MG tablet TAKE 1 TABLET BY MOUTH TWICE A DAY FOR 30 DAYS  3  . [employer] GENTLE LAXATIVE 10 MG suppository INSERT 1 SUPPOSITORY RECTALLY EVERY 24HRS AS NEEDED FOR CONSTIPATION  0  . [employer] STOOL SOFTENER 8.6-50 MG tablet TAKE 1 TABLET BY MOUTH TWICE A DAY AS NEEDED CONSTIPATION  0  . fluocinonide cream (LIDEX) 0.05 %     . gabapentin (NEURONTIN) 300 MG capsule Take 300 mg by mouth 3 (three) times daily.     Marland Kitchen ibuprofen (ADVIL,MOTRIN) 200  MG tablet Take 200 mg by mouth every 8 (eight) hours as needed for moderate pain. 4 every 8 hours    . ipratropium-albuterol (DUONEB) 0.5-2.5 (3) MG/3ML SOLN     . lisinopril (PRINIVIL,ZESTRIL) 10 MG tablet     . morphine (MS CONTIN) 15 MG 12 hr tablet Take 15 mg by mouth every 12 (twelve) hours.  0  . morphine (MS CONTIN) 30 MG 12 hr tablet Take 1 tablet (30 mg total) by mouth every 8 (eight) hours. 90 tablet 0  . Nivolumab (OPDIVO IV) Inject into the vein.    Marland Kitchen oxyCODONE (OXY IR/ROXICODONE) 5 MG immediate release tablet Take 10 mg by mouth every 6 (six) hours as needed (every 6 h prn).     . senna (SENOKOT) 8.6 MG tablet Take 2 tablets by mouth at bedtime.     . sertraline (ZOLOFT) 100 MG tablet Take 100 mg by mouth at bedtime.  1  . traZODone  (DESYREL) 50 MG tablet Take 50 mg by mouth at bedtime.   3  . triamcinolone cream (KENALOG) 0.5 % Apply topically 2 (two) times daily. 30 g 1  . LORazepam (ATIVAN) 0.5 MG tablet Take 1 tablet (0.5 mg total) by mouth every 8 (eight) hours. (Patient not taking: Reported on 11/17/2016) 30 tablet 0  . potassium chloride SA (K-DUR,KLOR-CON) 20 MEQ tablet Take 1 tablet (20 mEq total) by mouth daily. For 3 days (Patient not taking: Reported on 11/10/2016) 30 tablet 0  . tenofovir (VIREAD) 300 MG tablet Take 1 tablet (300 mg total) by mouth daily. (Patient not taking: Reported on 11/10/2016) 30 tablet 11  . XARELTO 20 MG TABS tablet Take 20 mg by mouth daily.  1   Current Facility-Administered Medications  Medication Dose Route Frequency Provider Last Rate Last Dose  . 0.9 %  sodium chloride infusion   Intravenous Once [name], MD        Review of Systems:  GENERAL:  Frustrated.  No fevers or sweats.  Weight down 11 pounds in 1 month. PERFORMANCE STATUS (ECOG):  2-3 HEENT:  No visual changes, runny nose, sore throat, mouth sores or tenderness.  Waiting on decision to fix teeth. Lungs:  Shortness of breath on exertion.  No cough or wheezing.  No hemoptysis. Cardiac:  No chest pain, palpitations, orthopnea, or PND. GI:  Chronic constipation.  No nausea, vomiting, diarrhea, melena or hematochezia. GU:  No urgency, frequency, dysuria, or hematuria. Self caths 3-4 x/week. Musculoskeletal:  Back and shoulder pain.  No muscle tenderness. Extremities:  Chronic left lower extremity swelling. Skin:  Eczema on elbows and back. Neuro:  Poor memory.  No focal numbness.  Decreased movement in left arm (slightly improved).  No headache.  Decreased feeling left leg (chronic). Endocrine:  No diabetes, thyroid issues, hot flashes or night sweats. Psych:  Struggling with not working.  Anxiety. Pain:Back pain 8 out of 10. Review of systems:  All other systems reviewed and found to be negative.  Physical  Exam: Blood pressure 113/77, pulse 88, temperature 98.3 F (36.8 C), temperature source Tympanic, resp. rate 18, weight 222 lb 6 oz (100.9 kg). GENERAL:  Fatigued appearing gentleman sitting comfortably in a chair in the exam room in no acute distress.  MENTAL STATUS:  Alert and oriented to person, place and time. HEAD:  Long gray hair with goatee.  Normocephalic, atraumatic, face symmetric, no Cushingoid features. EYES:  Pupils equal round and reactive to light and accomodation.  No conjunctivitis or scleral icterus.  ENT:  Oropharynx clear without lesion.  Poor dentition.  Tongue normal. Mucous membranes moist.  NECK:  Brace in place. RESPIRATORY:  Clear to auscultation without rales, wheezes or rhonchi. CARDIOVASCULAR:  Regular rate and rhythm without murmur, rub or gallop.  ABDOMEN:  Soft, non-tender, with active bowel sounds, and no hepatosplenomegaly.  No masses. SKIN:  Tattoos. Eczema on dorsal surface of forearms and lower back, minimal. EXTREMITIES:  No skin discoloration or tenderness.  No palpable cords. LYMPH NODES: No palpable cervical, supraclavicular, axillary or inguinal adenopathy  NEUROLOGICAL:  Decreased ROM left arm, improved.  Grips strength symmetric. PSYCH:  Appropriate.  . Imaging studies: 12/28/2013:  Abdominal and pelvic CT scan revealed an 8.5 x 10.2 x 9.2 cm irregularly enhancing mass of the left kidney with some internal calcifications. There were enlarged left sided renal hilar lymph nodes. There were multiple pulmonary nodules in both lower lobes (largest 1.1 cm). There was a destructive bone lesion with an irregular 8.6 x 5.1 cm soft tissue mass invasive of the spinal canal narrowing at least 50% and disturbing the pedicle and portions of the left transverse process rib and T8 vertebral body.  01/15/2014:  Chest CT revealed innumerable pulmonary nodules, a large metastatic lesion at T8 and left eighth rib with invasion into the spinal canal and moderate canal  stenosis. There was a left upper pole right kidney mass.  Head MRI was negative. 05/31/2015:  Chest, abdomen, and pelvic CT revealed slight enlargement of multiple pulmonary nodules.  The left renal mass was similar (12.2 x 9.6 cm).  The right kidney lesion was stable (lower pole lesion 2 x 1.7 cm).  Bone scan on 05/31/2015 revealed slight increased activity in the left costal vertebral junction at T7. 12/14/2015:  Chest, abdomen, and pelvic CT revealed an irregular 10.7 cm heterogeneous renal cortical mass in the upper left kidney and a heterogeneous 2.3 cm renal cortical mass in the lateral lower right kidney.  There was mild bilateral hydroureteronephrosis with bladder distention.  There was mild left para-aortic lymphadenopathy.  There were numerous (greater than 20) pulmonary metastases.  Largest nodule was 2.1 cm in the RLL. There was a partially visualized mildly expansile lytic lesion in the left proximal femur status post surgical transfixation.  Thre was a small probably loculated pleural effusion at the left eighth rib resection site.  There were no other bone abnormalities. 12/24/2015:  Bone scan revealed photopenia from prior removal of the left eighth rib. There was increased  uptake in the medial thoracic spine from T7 and T9 is felt to be due to postoperative fusion.  There was increased uptake throughout much of the left femur as well as increased uptake in the soft tissues of much of the left femur which may be due to previous surgery and radiation therapy change. 03/10/2016:  Chest CT revealed unchanged bilateral pulmonary nodules and a left renal mass. 08/08/2016:  Chest, abdomen, and pelvic CT revealed decreasing left kidney lesion, stable to increase in multifocal pulmonary nodules and a similar appearance of prominent upper abdominal lymph nodes that were worrisome for metastatic adenopathy. 10/20/2016:  Chest, abdomen, and pelvic CT revealed progressive disease with enlarging left renal  mass and pulmonary nodules.  Stable lytic lesion in posterior elements of T8.   10/23/2016:  Bone scan revealed activity identified in the region of prior thoracic spine surgery. Local progression of concern.  There was uptake in the posterior left ninth rib was new since 12/23/2015 corresponds to lytic lesion on recent CT.  This is compatible with metastatic disease.  There was uptake in the distal left femur concerning for metastatic involvement.  There was anterior left seventh rib activity also concerning for metastatic deposit. 10/27/2016:  MRI of the cervical spine revealed C4 body fracture with up to 75% height loss, likely pathologic.  There was no visible epidural or foraminal infiltration, but post-contrast imaging would be more sensitive.  On the symptomatic left side there is C4-5 and C5-6 left foraminal narrowing.  On the right, there is degenerative C3-4 and C6-7 foraminal impingement.  There was diffusely patent spinal canal. 10/28/2016: MRI at [facility] revealed C4 pathologic fracture with minimal retropulsion and moderate canal stenosis. New enhancing soft tissue lesion in posterior elements of T8 causing anterior displacement with severe stenosis without cord signal abnormality.   Appointment on 11/17/2016  Component Date Value Ref Range Status  . WBC 11/17/2016 7.0  3.8 - 10.6 K/uL Final  . RBC 11/17/2016 3.87* 4.40 - 5.90 MIL/uL Final  . Hemoglobin 11/17/2016 9.0* 13.0 - 18.0 g/dL Final  . HCT 11/17/2016 28.6* 40.0 - 52.0 % Final  . MCV 11/17/2016 74.1* 80.0 - 100.0 fL Final  . MCH 11/17/2016 23.2* 26.0 - 34.0 pg Final  . MCHC 11/17/2016 31.3* 32.0 - 36.0 g/dL Final  . RDW 11/17/2016 21.4* 11.5 - 14.5 % Final  . Platelets 11/17/2016 333  150 - 440 K/uL Final  . Neutrophils Relative % 11/17/2016 75  % Final  . Neutro Abs 11/17/2016 5.3  1.4 - 6.5 K/uL Final  . Lymphocytes Relative 11/17/2016 10  % Final  . Lymphs Abs 11/17/2016 0.7* 1.0 - 3.6 K/uL Final  . Monocytes Relative  11/17/2016 10  % Final  . Monocytes Absolute 11/17/2016 0.7  0.2 - 1.0 K/uL Final  . Eosinophils Relative 11/17/2016 4  % Final  . Eosinophils Absolute 11/17/2016 0.3  0 - 0.7 K/uL Final  . Basophils Relative 11/17/2016 1  % Final  . Basophils Absolute 11/17/2016 0.1  0 - 0.1 K/uL Final    Assessment:  MATTHAN SLEDGE is a 61 y.o. male with metastatic renal cell carcinoma presenting in 12/2013.  He had a large left renal mass, rib and vertebral body involvement, and multiple pulmonary nodules.  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    He was on Votrient (pazopanib) from 02/13/2014 - 01/21/2016.  He began nivolumab on 01/28/2016.  He received 3000 cGy to T7 - T9 and associated ribs beginning 05/11/2014.  On 06/15/2014, he received 1 dose of nivolumab.  He presented with progressive paralysis from the waist down on 07/06/2014.  He underwent left posterolateral thoracotomy with left chest wall resection with T8 corpectomy and interbody fusion with donor bone on 07/06/2014.  Pathology revealed metastatic renal cell carcinoma. Post-operatively, he developed sepsis secondary to a submandibular abscess dental abscess.  He underwent tracheostomy, I&D of a right neck abscess, tooth extraction, and PEG tube placement.  Zometa has subsequently been on hold.  He developed a pathologic fracture of the left femur.  He is s/p intramedullary nailing on 01/27/2015.  He received radiation to the left leg.  He was admitted on 04/26/2015 with aspiration pneumonia and opioid overdose. He is currently taking MSContin 15 mg BID and oxycodone 5 mg q day.  He developed a left lower extremity DVT (popliteal and soleal vein) on 06/07/2015.  Left lower extremity ultrasound on 02/07/2016 revealed no evidence of DVT.  He is on long term Xarelto.  Patient has a long standing history of an obstructive uropathy and elevated PVR.  He has undergone urethral dilatation on  several occasions.  He underwent cystoscopy, balloon urethral dilatation, and bladder biopsy with Foley catheter placement on 03/06/2016.  Bladder biopsy revealed cystitis with reactive changes, negative for atypia and malignancy.  He was admitted to [facility] from 02/14/2016 - 02/16/2016 with chest pain.  Chest CT angiogram on 02/15/2016 ruled out pulmonary embolism. Imaging revealed slight progression of mediastinal left hilar adenopathy was pulmonary metastatic disease. Stress test on 02/15/2016 ruled out cardiac ischemia.  He was treated with Augmentin for presumed lower extremity cellulitis.  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  There is possible increase in change in the lytic lesion with some movement at the fracture site.  He has broken the distal locking screw.  He ambulates with a rolling walker.  B[facility] scan [facility] on 06/22/2016 revealed no definite evidence of osseous metastatic disease.  There were photopenic regions overlying the left posterior eighth rib and the proximal left femur, likely representing prior surgical resection  He underwent removal of prior rod/intramedullary nail, radical resection of left femur, resection of tumor, and left hemiarthroplasty at [facility] on 06/16/2016.  Pathology of the left proximal femur revealed metastatic carcinoma compatible with renal cell carcinoma.  He is scheduled to begin physical therapy.   He received 13 cycles of nivolumab (01/28/2016 - 06/06/2016; 08/08/2016 - 10/03/2016).  He tolerated his infusions well.  TSH was 6.239 (0.35-4.50) with a normal free T4 (0.79) on 02/25/2016.  TSH was 2.713 (normal) and free T4 0.79 (normal) on 05/19/2016.  Treatment was held on 05/19/2016 secondary to diarrhea and on 05/26/2016 secondary to unexplained hypotension.   Cortisol level was 7.9 with a repeat of 10.3 (normal) on 05/29/2016 at 8:26 AM.  ACTH was 39.1 (normal) at 10 AM on 05/26/2016.  Chest, abdomen, and pelvic CT on 10/20/2016 revealed  progressive disease with enlarging left renal mass and pulmonary nodules.  Stable lytic lesion in posterior elements of T8.  Bone scan on 10/23/2016 revealed activity identified in the region of prior thoracic spine surgery. Local progression of concern.  There was uptake in the posterior left ninth rib is new since 12/23/2015 corresponds to lytic lesion on recent CT. This is compatible with metastatic disease.  There was uptake in the distal left femur concerning for metastatic involvement.  There was anterior left seventh rib activity also concerning for metastatic deposit.  MRI of the cervical spine on 10/27/2016 revealed C4 body fracture with up to 75% height loss, likely pathologic.  There was no visible epidural or foraminal infiltration, but post-contrast imaging would be more sensitive.  On the symptomatic left side there is C4-5 and C5-6 left foraminal narrowing.  On the right, there is degenerative C3-4 and C6-7 foraminal impingement.  There was diffusely patent spinal canal.  He underwent  C4 corpectomy with C3-5 plating, T6-T10 PSF with tumor decompression, and T8 laminectomy on 10/30/2016.  Pathology of the T8 lesion and C4 lesion confirmed renal cell carcinoma.  He developed progressive microcytic anemia c/w iron deficiency and anemia of chronic disease.  After his surgery at [facility] in 05/2016, he required 2 units of PRBCs.  MCV was 74.  Anemia work-up on 07/31/2016 revealed a ferritin 318 (falsely elevated secondary to ESR > 140), iron saturation 12%, TIBC 217 (low), and retic 2.5%.  He has received Venofer x 3 (08/22/2016, 09/05/2016, 09/19/2016, and 11/10/2016) with improvement in his hematocrit.  He has restrictive lung disease secondary to his weight and prior radiation.  He has been prescribed oxygen (not received).    His psoriasis flared on nivolumab, but has improved with topical steroids.  Psoriasis was back to baseline after 2 months off nivolumab.  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  Symptomatically, he is feeling slightly better.  He is frustrated by his neck collar and decreased mobility.  He has lost weight.  Hematocrit has improved from 26.6 to 28.6 in the past week.  Plan: 1.  Labs today:  CBC with diff. 2.  Discuss plan for initiation of cabozantinib (Cometriq) after clearance from neurosurgery.  Side effects reviewed. 3.  Discuss caloric intake.  Discuss supplements. 4.  Venofer today 5.  RTC in 1 week for Venofer 6.  RTC on 11/30/2016 (coordinate with appt with neurosurgery) for MD assessment and labs (CBC with diff, CMP, Mg).   [name], MD 11/17/2016

## 2016-11-24 ENCOUNTER — Other Ambulatory Visit: Payer: Self-pay | Admitting: Hematology and Oncology

## 2016-11-24 ENCOUNTER — Inpatient Hospital Stay: Payer: Medicaid Other

## 2016-11-29 ENCOUNTER — Other Ambulatory Visit: Payer: Self-pay | Admitting: *Deleted

## 2016-11-29 NOTE — Telephone Encounter (Signed)
  Let's make sure patient has seen Dr. Baruch Gouty.

## 2016-11-29 NOTE — Telephone Encounter (Signed)
[name] does not want to refill this at this time  Thanks,

## 2016-11-29 NOTE — Telephone Encounter (Signed)
Pharmacy request to refill Potassium tabs, this was written 5/1 to be taken for 3 days last K+ was 3.7 on 5/24, please advise

## 2016-11-30 ENCOUNTER — Other Ambulatory Visit: Payer: Self-pay

## 2016-11-30 ENCOUNTER — Ambulatory Visit: Payer: Medicaid Other

## 2016-11-30 ENCOUNTER — Inpatient Hospital Stay (HOSPITAL_BASED_OUTPATIENT_CLINIC_OR_DEPARTMENT_OTHER): Payer: Medicaid Other | Admitting: Hematology and Oncology

## 2016-11-30 ENCOUNTER — Ambulatory Visit
Admission: RE | Admit: 2016-11-30 | Discharge: 2016-11-30 | Disposition: A | Discharge status: Home / Self Care | Payer: Medicaid Other | Source: Ambulatory Visit | Attending: Radiation Oncology | Admitting: Radiation Oncology

## 2016-11-30 ENCOUNTER — Inpatient Hospital Stay: Payer: Medicaid Other

## 2016-11-30 ENCOUNTER — Telehealth: Payer: Self-pay | Admitting: *Deleted

## 2016-11-30 ENCOUNTER — Encounter: Payer: Self-pay | Admitting: Hematology and Oncology

## 2016-11-30 VITALS — BP 146/88 | HR 72 | Temp 98.1°F | Resp 18 | Wt 224.6 lb

## 2016-11-30 DIAGNOSIS — Z7901 Long term (current) use of anticoagulants: Secondary | ICD-10-CM | POA: Insufficient documentation

## 2016-11-30 DIAGNOSIS — C7951 Secondary malignant neoplasm of bone: Secondary | ICD-10-CM | POA: Insufficient documentation

## 2016-11-30 DIAGNOSIS — I509 Heart failure, unspecified: Secondary | ICD-10-CM | POA: Diagnosis not present

## 2016-11-30 DIAGNOSIS — E785 Hyperlipidemia, unspecified: Secondary | ICD-10-CM | POA: Diagnosis not present

## 2016-11-30 DIAGNOSIS — I10 Essential (primary) hypertension: Secondary | ICD-10-CM

## 2016-11-30 DIAGNOSIS — F419 Anxiety disorder, unspecified: Secondary | ICD-10-CM | POA: Insufficient documentation

## 2016-11-30 DIAGNOSIS — R110 Nausea: Secondary | ICD-10-CM

## 2016-11-30 DIAGNOSIS — C642 Malignant neoplasm of left kidney, except renal pelvis: Secondary | ICD-10-CM | POA: Insufficient documentation

## 2016-11-30 DIAGNOSIS — Z79899 Other long term (current) drug therapy: Secondary | ICD-10-CM | POA: Diagnosis not present

## 2016-11-30 DIAGNOSIS — Z8781 Personal history of (healed) traumatic fracture: Secondary | ICD-10-CM

## 2016-11-30 DIAGNOSIS — Z923 Personal history of irradiation: Secondary | ICD-10-CM

## 2016-11-30 DIAGNOSIS — G893 Neoplasm related pain (acute) (chronic): Secondary | ICD-10-CM

## 2016-11-30 DIAGNOSIS — L409 Psoriasis, unspecified: Secondary | ICD-10-CM

## 2016-11-30 DIAGNOSIS — R531 Weakness: Secondary | ICD-10-CM | POA: Diagnosis not present

## 2016-11-30 DIAGNOSIS — F329 Major depressive disorder, single episode, unspecified: Secondary | ICD-10-CM | POA: Insufficient documentation

## 2016-11-30 DIAGNOSIS — D649 Anemia, unspecified: Secondary | ICD-10-CM

## 2016-11-30 DIAGNOSIS — C7800 Secondary malignant neoplasm of unspecified lung: Secondary | ICD-10-CM

## 2016-11-30 DIAGNOSIS — K219 Gastro-esophageal reflux disease without esophagitis: Secondary | ICD-10-CM | POA: Insufficient documentation

## 2016-11-30 DIAGNOSIS — M4802 Spinal stenosis, cervical region: Secondary | ICD-10-CM | POA: Insufficient documentation

## 2016-11-30 DIAGNOSIS — C649 Malignant neoplasm of unspecified kidney, except renal pelvis: Secondary | ICD-10-CM

## 2016-11-30 DIAGNOSIS — Z86718 Personal history of other venous thrombosis and embolism: Secondary | ICD-10-CM

## 2016-11-30 DIAGNOSIS — Z85828 Personal history of other malignant neoplasm of skin: Secondary | ICD-10-CM

## 2016-11-30 DIAGNOSIS — Z7189 Other specified counseling: Secondary | ICD-10-CM

## 2016-11-30 DIAGNOSIS — D509 Iron deficiency anemia, unspecified: Secondary | ICD-10-CM | POA: Diagnosis not present

## 2016-11-30 DIAGNOSIS — R630 Anorexia: Secondary | ICD-10-CM | POA: Diagnosis not present

## 2016-11-30 DIAGNOSIS — R200 Anesthesia of skin: Secondary | ICD-10-CM | POA: Insufficient documentation

## 2016-11-30 DIAGNOSIS — I252 Old myocardial infarction: Secondary | ICD-10-CM | POA: Insufficient documentation

## 2016-11-30 DIAGNOSIS — I82532 Chronic embolism and thrombosis of left popliteal vein: Secondary | ICD-10-CM

## 2016-11-30 DIAGNOSIS — Z8701 Personal history of pneumonia (recurrent): Secondary | ICD-10-CM

## 2016-11-30 DIAGNOSIS — R918 Other nonspecific abnormal finding of lung field: Secondary | ICD-10-CM | POA: Insufficient documentation

## 2016-11-30 DIAGNOSIS — Z9221 Personal history of antineoplastic chemotherapy: Secondary | ICD-10-CM | POA: Insufficient documentation

## 2016-11-30 DIAGNOSIS — Z510 Encounter for antineoplastic radiation therapy: Secondary | ICD-10-CM | POA: Insufficient documentation

## 2016-11-30 LAB — COMPREHENSIVE METABOLIC PANEL
ALT: 23 U/L (ref 17–63)
AST: 29 U/L (ref 15–41)
Albumin: 3.5 g/dL (ref 3.5–5.0)
Alkaline Phosphatase: 133 U/L — ABNORMAL HIGH (ref 38–126)
Anion gap: 9 (ref 5–15)
BUN: 15 mg/dL (ref 6–20)
CO2: 29 mmol/L (ref 22–32)
Calcium: 8.6 mg/dL — ABNORMAL LOW (ref 8.9–10.3)
Chloride: 97 mmol/L — ABNORMAL LOW (ref 101–111)
Creatinine, Ser: 0.94 mg/dL (ref 0.61–1.24)
GFR calc Af Amer: >60 mL/min (ref >60)
GFR calc non Af Amer: >60 mL/min (ref >60)
Glucose, Bld: 99 mg/dL (ref 65–99)
Potassium: 3.8 mmol/L (ref 3.5–5.1)
Sodium: 135 mmol/L (ref 135–145)
Total Bilirubin: 0.4 mg/dL (ref 0.3–1.2)
Total Protein: 7.9 g/dL (ref 6.5–8.1)

## 2016-11-30 LAB — CBC WITH DIFFERENTIAL/PLATELET
Basophils Absolute: 0.1 10*3/uL (ref 0–0.1)
Basophils Relative: 1 %
Eosinophils Absolute: 0.3 10*3/uL (ref 0–0.7)
Eosinophils Relative: 6 %
HCT: 33.1 % — ABNORMAL LOW (ref 40.0–52.0)
Hemoglobin: 10.6 g/dL — ABNORMAL LOW (ref 13.0–18.0)
Lymphocytes Relative: 13 %
Lymphs Abs: 0.7 10*3/uL — ABNORMAL LOW (ref 1.0–3.6)
MCH: 23.5 pg — ABNORMAL LOW (ref 26.0–34.0)
MCHC: 32 g/dL (ref 32.0–36.0)
MCV: 73.5 fL — ABNORMAL LOW (ref 80.0–100.0)
Monocytes Absolute: 0.5 10*3/uL (ref 0.2–1.0)
Monocytes Relative: 9 %
Neutro Abs: 3.9 10*3/uL (ref 1.4–6.5)
Neutrophils Relative %: 71 %
Platelets: 217 10*3/uL (ref 150–440)
RBC: 4.5 MIL/uL (ref 4.40–5.90)
RDW: 20.3 % — ABNORMAL HIGH (ref 11.5–14.5)
WBC: 5.4 10*3/uL (ref 3.8–10.6)

## 2016-11-30 LAB — MAGNESIUM: Magnesium: 1.9 mg/dL (ref 1.7–2.4)

## 2016-11-30 MED ORDER — LORAZEPAM 0.5 MG PO TABS: 0.5000 mg | ORAL_TABLET | Freq: Three times a day (TID) | ORAL | 0 refills | Status: DC

## 2016-11-30 NOTE — Progress Notes (Signed)
We were asked to evaluate Marc Blake by [name] to render an opinion regarding the role of radiation in the management of his renal cell carcinoma. The patient was seen in consultation.  Cc: spine mets, RCC  DIAGNOSIS: renal cell carinoma, metastatic  ACTIVE TREATMENT: cabozantinib  HPI: This is a 61 yo M who's cancer history dates to Aug 2015 when he was diagnosed with metastatic RCC. He has been on several lines of chemo including pazopanib, nivolumab cabozantinib. He received palliative RT to T7-T9 (per report, confirmation with outside records pending) in Nov 2015 receiving 30 Gy in 10 fractions.  Soon after that RT he presented with progressive paralysis and underwent left chest wall resection with T8 corpectomy (Jan 2016).  More recently in May of 2018 he presented with neck/left shoulder weakness. Restaging studies demonstrated progressive disease with enlarging left renal mass and pulmonary nodules.  Stable lytic lesion in posterior elements of T8.  Bone scan on 10/23/2016 revealed activity identified in the region of prior thoracic spine surgery. Local progression of concern.  There was uptake in the posterior left ninth rib is new since 12/23/2015 corresponds to lytic lesion on recent CT. There was uptake in the distal left femur concerning for metastatic involvement.  There was anterior left seventh rib activity also concerning for metastatic deposit. MRI of the cervical spine on 10/27/2016 revealed C4 body fracture with up to 75% height loss, likely pathologic. He was admitted to [facility] from 10/28/2016 - 11/03/2016.  MRI on 10/28/2016 showed C4 pathologic fracture with minimal retropulsion and moderate canal stenosis. New enhancing soft tissue lesion in posterior elements of T8 causing anterior displacement with severe stenosis without cord signal abnormality.   On 10/30/2016, he underwent  C4 corpectomy with C3-5 plating, T6-T10 PSF with tumor decompression, and T8 laminectomy. Pathology of  the T8 lesion and C4 lesion confirmed renal cell carcinoma.  He presents today for discussion of palliative radiation to the C4 and T8 regions. At this time he has pain when standing that resolves completely when he lays down. It is worsened by bending over. He continues to have minor paresthesias of his finger tips bilaterally. He denies incontinence. He has weakness of his left arm/shoulder. He is currently on cabozantinib.   Past Medical History:  Diagnosis Date  . Acid reflux   . Anxiety   . Arthritis   . CHF (congestive heart failure) ([place])   . Depression   . Depression   . Hyperlipidemia   . Hypertension   . Myocardial infarction ([place])   . Renal cancer ([place])   . Renal cell carcinoma ([place])   . Renal insufficiency   . Skin cancer     Past Surgical History:  Procedure Laterality Date  . BACK SURGERY    . CARDIAC CATHETERIZATION     with stent  . CYSTOSCOPY WITH BIOPSY  03/06/2016   Procedure: CYSTOSCOPY WITH BIOPSY;  Surgeon: [name], MD;  Location: [facility] ORS;  Service: Urology;;  . [name] WITH URETHRAL DILATATION N/A 03/06/2016   Procedure: CYSTOSCOPY WITH URETHRAL DILATATION WITH CATHETER PLACEMENT;  Surgeon: [name], MD;  Location: [facility] ORS;  Service: Urology;  Laterality: N/A;  . KNEE SURGERY Left   . LEG SURGERY Left   . TONSILLECTOMY     The patient has a family history of Family History  Problem Relation Age of Onset  . Hypertension Father   . Heart attack Father   . Stroke Sister   . Hypertension Brother   . Stroke  Maternal Uncle     SOCIAL HISTORY: Never smoker. Accompanied by wife. Unemployed, lost his job of 35 years in early 09/2016.  ALLERGIES: No Known Allergies  MEDICATIONS:  Current Outpatient Prescriptions on File Prior to Encounter  Medication Sig Dispense Refill  . amLODipine (NORVASC) 10 MG tablet Take 10 mg by mouth daily.  3  . atorvastatin (LIPITOR) 20 MG tablet TAKE 1 TABLET ONCE A DAY (AT BEDTIME) FOR 30 DAYS  2  .  busPIRone (BUSPAR) 7.5 MG tablet     . carvedilol (COREG) 12.5 MG tablet TAKE 1 TABLET BY MOUTH TWICE A DAY FOR 30 DAYS  3  . [facility] GENTLE LAXATIVE 10 MG suppository INSERT 1 SUPPOSITORY RECTALLY EVERY 24HRS AS NEEDED FOR CONSTIPATION  0  . [facility] STOOL SOFTENER 8.6-50 MG tablet TAKE 1 TABLET BY MOUTH TWICE A DAY AS NEEDED CONSTIPATION  0  . fluocinonide cream (LIDEX) 0.05 %     . gabapentin (NEURONTIN) 300 MG capsule Take 300 mg by mouth 3 (three) times daily.     Marland Kitchen ibuprofen (ADVIL,MOTRIN) 200 MG tablet Take 200 mg by mouth every 8 (eight) hours as needed for moderate pain. 4 every 8 hours    . ipratropium-albuterol (DUONEB) 0.5-2.5 (3) MG/3ML SOLN     . lisinopril (PRINIVIL,ZESTRIL) 10 MG tablet     . LORazepam (ATIVAN) 0.5 MG tablet Take 1 tablet (0.5 mg total) by mouth every 8 (eight) hours. 30 tablet 0  . morphine (MS CONTIN) 15 MG 12 hr tablet Take 15 mg by mouth every 12 (twelve) hours.  0  . morphine (MS CONTIN) 30 MG 12 hr tablet Take 1 tablet (30 mg total) by mouth every 8 (eight) hours. 90 tablet 0  . oxyCODONE (OXY IR/ROXICODONE) 5 MG immediate release tablet Take 10 mg by mouth every 6 (six) hours as needed (every 6 h prn).     . potassium chloride SA (K-DUR,KLOR-CON) 20 MEQ tablet Take 1 tablet (20 mEq total) by mouth daily. For 3 days 30 tablet 0  . senna (SENOKOT) 8.6 MG tablet Take 2 tablets by mouth at bedtime.     . sertraline (ZOLOFT) 100 MG tablet Take 100 mg by mouth at bedtime.  1  . tenofovir (VIREAD) 300 MG tablet Take 1 tablet (300 mg total) by mouth daily. (Patient not taking: Reported on 11/10/2016) 30 tablet 11  . traZODone (DESYREL) 50 MG tablet Take 50 mg by mouth at bedtime.   3  . triamcinolone cream (KENALOG) 0.5 % Apply topically 2 (two) times daily. 30 g 1  . XARELTO 20 MG TABS tablet Take 20 mg by mouth daily.  1   Current Facility-Administered Medications on File Prior to Encounter  Medication Dose Route Frequency Provider Last Rate Last Dose  . 0.9 %  sodium  chloride infusion   Intravenous Once [name], MD        REVIEW OF SYSTEMS: No cough, hemoptysis, DOE, SOB, Chest pain. Chronic constipation and self caths at regular intervals. The remainder of a 10 system review is negative except as documents in the HPI.  PHYSICAL EXAM: BP146/88, pulse 72, temperature 98.1 F (36.7 C), temperature source Tympanic, resp. rate 18, weight 224 lb 9 oz (101.9 kg). ECOG PS: 2 Awake, alert, NAD A&Ox3 Normocephalic, atraumatic, PERRL, EOMI, sclera anicteric Poor dentition, MMM Neck brace in place.  Breathing comfortably without wheeze RRR Surgical scars on back well healed with no residual staples LUE - limited abduction to ~90, strength in LUE  4/5, RUE 5/5, all other extremities and axial groups 5/5.    MEDICAL DECISION MAKING IMAGING: I have reviewed CT CAP, bone scan, MRI cervical spine as reviewed in the HPI. PATHOLOGY: I have reviewed outside path from 10/30/16 which demonstrates RCC in C4 and T8 bone excision. COORDINATION OF CARE: I have discussed the case with [name]. We will hold cabozantinib during radiation and result after completion of therapy. I have reviewed outside records of prior RT. OUTSIDE RECORD REVIEW: I have reviewed outside radiation reports - prior cord Dmax = 31.8 Gy in 10 fx completed May 21, 2014. Field had a ~12 cm cranial/caudal extent centered around T8 and was delivered via 4 field technique to include the majority of the left posterior ribs at this level. Heart Dmax was not recorded but appears to be between 30% and 50%. Esophagus was at the field edge.  ASSESSMENT: 61 yo M with metastatic RCC s/p C4 and T8 corpectomy in May 2018 who presents for discussion of postoperative radiotherapy.  DISCUSSION:  I had a long discussion with Mr. Lowrimore and his wife regarding his pathologic, imaging, and surgical findings. I discussed the role of radiation in the management of his cancer. I divided this discussion into two  parts:  1) cervical spine - we discussed the role of palliative RT to the cervical spine to improve local control and decrease the risk of recurrence. We discussed the acute and long-term side effects of treatment including odynophagia, fatigue, bone marrow suppression, and thyroid dysfunction. I have recommended 30 Gy in 10 fractions. 2) thoracic spine - we discussed the role of palliative reirradiation to the thoracic spine. This discussion focused on the increased risks associated with retreatment including permanent neurologic damage leading to paralysis and death. I explained that due to the retreatment I would recommend a lower dose/fraction and total dose to balance the tumor control and complications. However, given the progressive disease in this region and the interval since his prior radiation I believe the risk is reasonable. I discussed with [name] holding his cabozantinib during radiation. She agrees with this plan. I have recommended 20 Gy in 10 fractions. Due to the surgical hardware in this area and the proximity of disease to spinal cord I do not think a SBRT plan would provide additional benefit.   Mr. Stoklosa is in agreement with the plan for radiation and understands the risks associated with reirradiation. He has signed consent to proceed with treatment.  PLAN: 1. CT simulation tomorrow 2. Postop radiotherpay to begin next week 3. Hold cabozantinib prior to starting radiation and throughout course of RT.    Mallie Darting MD PhD Radiation Oncology

## 2016-11-30 NOTE — Progress Notes (Signed)
Patient requesting refill for Lorazepam.

## 2016-11-30 NOTE — Telephone Encounter (Signed)
Called in a refill for ativan per MD [facility] spoke with [facility]

## 2016-11-30 NOTE — Progress Notes (Signed)
[facility] day:  11/30/2016   Chief Complaint: Marc Blake is a 61 y.o. male with metastatic renal cell carcinoma who is seen for 2 week assessment on cabozantinib (Cometriq).  HPI: The patient was last seen in the medical oncology clinic on 11/17/2016.  At that time, he had more movement in his left arm post surgery.  He remained frustrated secondary to his poor mobility with his neck brace and pain.  He received Venofer for iron deficiency anemia.  He was cleared by Neurosurgery to begin cabozantinib.  He was seen yesterday by Dr.Yarbrough. He states that he has 4 left pills left.  He comments that he get queasy.  He takes the pill early in the morning.  Overall, he feels "ok", but still miserable since his surgery.   Past Medical History:  Diagnosis Date  . Acid reflux   . Anxiety   . Arthritis   . CHF (congestive heart failure) ([place])   . Depression   . Depression   . Hyperlipidemia   . Hypertension   . Myocardial infarction ([place])   . Renal cancer ([place])   . Renal cell carcinoma ([place])   . Renal insufficiency   . Skin cancer     Past Surgical History:  Procedure Laterality Date  . BACK SURGERY    . CARDIAC CATHETERIZATION     with stent  . CYSTOSCOPY WITH BIOPSY  03/06/2016   Procedure: CYSTOSCOPY WITH BIOPSY;  Surgeon: [name], MD;  Location: [facility] ORS;  Service: Urology;;  . [name] WITH URETHRAL DILATATION N/A 03/06/2016   Procedure: CYSTOSCOPY WITH URETHRAL DILATATION WITH CATHETER PLACEMENT;  Surgeon: [name], MD;  Location: [facility] ORS;  Service: Urology;  Laterality: N/A;  . KNEE SURGERY Left   . LEG SURGERY Left   . TONSILLECTOMY      Family History  Problem Relation Age of Onset  . Hypertension Father   . Heart attack Father   . Stroke Sister   . Hypertension Brother   . Stroke Maternal Uncle     Social History:  reports that he has never smoked. He has never used smokeless tobacco. He reports  that he does not drink alcohol or use drugs.  He is originally from [place].  He then lived in [place] for 17 years.  He moved to [place] with his wife.  He lost his job of 35 years in early 09/2016.  He is accompanied by his wife.  Allergies: No Known Allergies  Current Medications: Current Outpatient Prescriptions  Medication Sig Dispense Refill  . amLODipine (NORVASC) 10 MG tablet Take 10 mg by mouth daily.  3  . atorvastatin (LIPITOR) 20 MG tablet TAKE 1 TABLET ONCE A DAY (AT BEDTIME) FOR 30 DAYS  2  . busPIRone (BUSPAR) 7.5 MG tablet     . carvedilol (COREG) 12.5 MG tablet TAKE 1 TABLET BY MOUTH TWICE A DAY FOR 30 DAYS  3  . [employer] GENTLE LAXATIVE 10 MG suppository INSERT 1 SUPPOSITORY RECTALLY EVERY 24HRS AS NEEDED FOR CONSTIPATION  0  . [employer] STOOL SOFTENER 8.6-50 MG tablet TAKE 1 TABLET BY MOUTH TWICE A DAY AS NEEDED CONSTIPATION  0  . fluocinonide cream (LIDEX) 0.05 %     . gabapentin (NEURONTIN) 300 MG capsule Take 300 mg by mouth 3 (three) times daily.     Marland Kitchen ibuprofen (ADVIL,MOTRIN) 200 MG tablet Take 200 mg by mouth every 8 (eight) hours as needed for moderate pain. 4 every  8 hours    . ipratropium-albuterol (DUONEB) 0.5-2.5 (3) MG/3ML SOLN     . lisinopril (PRINIVIL,ZESTRIL) 10 MG tablet     . LORazepam (ATIVAN) 0.5 MG tablet Take 1 tablet (0.5 mg total) by mouth every 8 (eight) hours. 30 tablet 0  . morphine (MS CONTIN) 15 MG 12 hr tablet Take 15 mg by mouth every 12 (twelve) hours.  0  . morphine (MS CONTIN) 30 MG 12 hr tablet Take 1 tablet (30 mg total) by mouth every 8 (eight) hours. 90 tablet 0  . oxyCODONE (OXY IR/ROXICODONE) 5 MG immediate release tablet Take 10 mg by mouth every 6 (six) hours as needed (every 6 h prn).     . potassium chloride SA (K-DUR,KLOR-CON) 20 MEQ tablet Take 1 tablet (20 mEq total) by mouth daily. For 3 days 30 tablet 0  . senna (SENOKOT) 8.6 MG tablet Take 2 tablets by mouth at bedtime.     . sertraline (ZOLOFT) 100 MG tablet Take 100 mg by  mouth at bedtime.  1  . traZODone (DESYREL) 50 MG tablet Take 50 mg by mouth at bedtime.   3  . triamcinolone cream (KENALOG) 0.5 % Apply topically 2 (two) times daily. 30 g 1  . XARELTO 20 MG TABS tablet Take 20 mg by mouth daily.  1  . tenofovir (VIREAD) 300 MG tablet Take 1 tablet (300 mg total) by mouth daily. (Patient not taking: Reported on 11/10/2016) 30 tablet 11   Current Facility-Administered Medications  Medication Dose Route Frequency Provider Last Rate Last Dose  . 0.9 %  sodium chloride infusion   Intravenous Once [name], MD        Review of Systems:  GENERAL:  "Ok but miserable since surgery".  No fevers or sweats.  Weight up 2 pounds. PERFORMANCE STATUS (ECOG):  2-3 HEENT:  No visual changes, runny nose, sore throat, mouth sores or tenderness.  Waiting on decision to fix teeth. Lungs:  Shortness of breath on exertion.  No cough or wheezing.  No hemoptysis. Cardiac:  No chest pain, palpitations, orthopnea, or PND. GI:  Queasy.  No vomiting, diarrhea, melena or hematochezia. GU:  No urgency, frequency, dysuria, or hematuria. Self caths 3-4 x/week. Musculoskeletal:  Back and shoulder pain.  No muscle tenderness. Extremities:  Chronic left lower extremity swelling. Skin:  Eczema on elbows and back. Neuro:  Poor memory.  No focal numbness.  Decreased movement in left arm (slightly improved).  No headache.  Decreased feeling left leg (chronic). Endocrine:  No diabetes, thyroid issues, hot flashes or night sweats. Psych:  Struggling with not working.  Anxiety. Pain: Back pain. Review of systems:  All other systems reviewed and found to be negative.  Physical Exam: Blood pressure (!) 146/88, pulse 72, temperature 98.1 F (36.7 C), temperature source Tympanic, resp. rate 18, weight 224 lb 9 oz (101.9 kg). GENERAL:  Fatigued appearing gentleman sitting comfortably in a chair in the exam room in no acute distress.  MENTAL STATUS:  Alert and oriented to person, place  and time. HEAD:  Long gray hair with goatee.  Normocephalic, atraumatic, face symmetric, no Cushingoid features. EYES:  Pupils equal round and reactive to light and accomodation.  No conjunctivitis or scleral icterus. ENT:  Oropharynx clear without lesion.  Poor dentition.  Tongue normal. Mucous membranes moist.  NECK:  Brace in place. RESPIRATORY:  Clear to auscultation without rales, wheezes or rhonchi. CARDIOVASCULAR:  Regular rate and rhythm without murmur, rub or gallop.  ABDOMEN:  Soft, non-tender, with active bowel sounds, and no hepatosplenomegaly.  No masses. SKIN:  Tattoos. Eczema on dorsal surface of forearms and lower back, minimal. EXTREMITIES:  No skin discoloration or tenderness.  No palpable cords. LYMPH NODES: No palpable cervical, supraclavicular, axillary or inguinal adenopathy  NEUROLOGICAL:  Decreased ROM left arm, improved.  Grips strength symmetric. PSYCH:  Appropriate.  . Imaging studies: 12/28/2013:  Abdominal and pelvic CT scan revealed an 8.5 x 10.2 x 9.2 cm irregularly enhancing mass of the left kidney with some internal calcifications. There were enlarged left sided renal hilar lymph nodes. There were multiple pulmonary nodules in both lower lobes (largest 1.1 cm). There was a destructive bone lesion with an irregular 8.6 x 5.1 cm soft tissue mass invasive of the spinal canal narrowing at least 50% and disturbing the pedicle and portions of the left transverse process rib and T8 vertebral body.  01/15/2014:  Chest CT revealed innumerable pulmonary nodules, a large metastatic lesion at T8 and left eighth rib with invasion into the spinal canal and moderate canal stenosis. There was a left upper pole right kidney mass.  Head MRI was negative. 05/31/2015:  Chest, abdomen, and pelvic CT revealed slight enlargement of multiple pulmonary nodules.  The left renal mass was similar (12.2 x 9.6 cm).  The right kidney lesion was stable (lower pole lesion 2 x 1.7 cm).  Bone scan on  05/31/2015 revealed slight increased activity in the left costal vertebral junction at T7. 12/14/2015:  Chest, abdomen, and pelvic CT revealed an irregular 10.7 cm heterogeneous renal cortical mass in the upper left kidney and a heterogeneous 2.3 cm renal cortical mass in the lateral lower right kidney.  There was mild bilateral hydroureteronephrosis with bladder distention.  There was mild left para-aortic lymphadenopathy.  There were numerous (greater than 20) pulmonary metastases.  Largest nodule was 2.1 cm in the RLL. There was a partially visualized mildly expansile lytic lesion in the left proximal femur status post surgical transfixation.  Thre was a small probably loculated pleural effusion at the left eighth rib resection site.  There were no other bone abnormalities. 12/24/2015:  Bone scan revealed photopenia from prior removal of the left eighth rib. There was increased  uptake in the medial thoracic spine from T7 and T9 is felt to be due to postoperative fusion.  There was increased uptake throughout much of the left femur as well as increased uptake in the soft tissues of much of the left femur which may be due to previous surgery and radiation therapy change. 03/10/2016:  Chest CT revealed unchanged bilateral pulmonary nodules and a left renal mass. 08/08/2016:  Chest, abdomen, and pelvic CT revealed decreasing left kidney lesion, stable to increase in multifocal pulmonary nodules and a similar appearance of prominent upper abdominal lymph nodes that were worrisome for metastatic adenopathy. 10/20/2016:  Chest, abdomen, and pelvic CT revealed progressive disease with enlarging left renal mass and pulmonary nodules.  Stable lytic lesion in posterior elements of T8.   10/23/2016:  Bone scan revealed activity identified in the region of prior thoracic spine surgery. Local progression of concern.  There was uptake in the posterior left ninth rib was new since 12/23/2015 corresponds to lytic lesion on  recent CT. This is compatible with metastatic disease.  There was uptake in the distal left femur concerning for metastatic involvement.  There was anterior left seventh rib activity also concerning for metastatic deposit. 10/27/2016:  MRI of the cervical spine revealed C4 body fracture with up  to 75% height loss, likely pathologic.  There was no visible epidural or foraminal infiltration, but post-contrast imaging would be more sensitive.  On the symptomatic left side there is C4-5 and C5-6 left foraminal narrowing.  On the right, there is degenerative C3-4 and C6-7 foraminal impingement.  There was diffusely patent spinal canal. 10/28/2016: MRI at [facility] revealed C4 pathologic fracture with minimal retropulsion and moderate canal stenosis. New enhancing soft tissue lesion in posterior elements of T8 causing anterior displacement with severe stenosis without cord signal abnormality.   Orders Only on 11/30/2016  Component Date Value Ref Range Status  . Sodium 11/30/2016 135  135 - 145 mmol/L Final  . Potassium 11/30/2016 3.8  3.5 - 5.1 mmol/L Final  . Chloride 11/30/2016 97* 101 - 111 mmol/L Final  . CO2 11/30/2016 29  22 - 32 mmol/L Final  . Glucose, Bld 11/30/2016 99  65 - 99 mg/dL Final  . BUN 11/30/2016 15  6 - 20 mg/dL Final  . Creatinine, Ser 11/30/2016 0.94  0.61 - 1.24 mg/dL Final  . Calcium 11/30/2016 8.6* 8.9 - 10.3 mg/dL Final  . Total Protein 11/30/2016 7.9  6.5 - 8.1 g/dL Final  . Albumin 11/30/2016 3.5  3.5 - 5.0 g/dL Final  . AST 11/30/2016 29  15 - 41 U/L Final  . ALT 11/30/2016 23  17 - 63 U/L Final  . Alkaline Phosphatase 11/30/2016 133* 38 - 126 U/L Final  . Total Bilirubin 11/30/2016 0.4  0.3 - 1.2 mg/dL Final  . GFR calc non Af Amer 11/30/2016 >60  >60 mL/min Final  . GFR calc Af Amer 11/30/2016 >60  >60 mL/min Final   Comment: (NOTE) The eGFR has been calculated using the CKD EPI equation. This calculation has not been validated in all clinical situations. eGFR's  persistently <60 mL/min signify possible Chronic Kidney Disease.   . Anion gap 11/30/2016 9  5 - 15 Final  . WBC 11/30/2016 5.4  3.8 - 10.6 K/uL Final  . RBC 11/30/2016 4.50  4.40 - 5.90 MIL/uL Final  . Hemoglobin 11/30/2016 10.6* 13.0 - 18.0 g/dL Final  . HCT 11/30/2016 33.1* 40.0 - 52.0 % Final  . MCV 11/30/2016 73.5* 80.0 - 100.0 fL Final  . MCH 11/30/2016 23.5* 26.0 - 34.0 pg Final  . MCHC 11/30/2016 32.0  32.0 - 36.0 g/dL Final  . RDW 11/30/2016 20.3* 11.5 - 14.5 % Final  . Platelets 11/30/2016 217  150 - 440 K/uL Final  . Neutrophils Relative % 11/30/2016 71  % Final  . Neutro Abs 11/30/2016 3.9  1.4 - 6.5 K/uL Final  . Lymphocytes Relative 11/30/2016 13  % Final  . Lymphs Abs 11/30/2016 0.7* 1.0 - 3.6 K/uL Final  . Monocytes Relative 11/30/2016 9  % Final  . Monocytes Absolute 11/30/2016 0.5  0.2 - 1.0 K/uL Final  . Eosinophils Relative 11/30/2016 6  % Final  . Eosinophils Absolute 11/30/2016 0.3  0 - 0.7 K/uL Final  . Basophils Relative 11/30/2016 1  % Final  . Basophils Absolute 11/30/2016 0.1  0 - 0.1 K/uL Final  . Magnesium 11/30/2016 1.9  1.7 - 2.4 mg/dL Final    Assessment:  Marc Blake is a 61 y.o. male with metastatic renal cell carcinoma presenting in 12/2013.  He had a large left renal mass, rib and vertebral body involvement, and multiple pulmonary nodules.  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    He was on  Votrient (pazopanib) from 02/13/2014 - 01/21/2016.  He began nivolumab on 01/28/2016.  He received 3000 cGy to T7 - T9 and associated ribs beginning 05/11/2014.  On 06/15/2014, he received 1 dose of nivolumab.  He presented with progressive paralysis from the waist down on 07/06/2014.  He underwent left posterolateral thoracotomy with left chest wall resection with T8 corpectomy and interbody fusion with donor bone on 07/06/2014.  Pathology revealed metastatic renal cell carcinoma. Post-operatively, he  developed sepsis secondary to a submandibular abscess dental abscess.  He underwent tracheostomy, I&D of a right neck abscess, tooth extraction, and PEG tube placement.  Zometa has subsequently been on hold.  He developed a pathologic fracture of the left femur.  He is s/p intramedullary nailing on 01/27/2015.  He received radiation to the left leg.  He was admitted on 04/26/2015 with aspiration pneumonia and opioid overdose. He is currently taking MSContin 15 mg BID and oxycodone 5 mg q day.  He developed a left lower extremity DVT (popliteal and soleal vein) on 06/07/2015.  Left lower extremity ultrasound on 02/07/2016 revealed no evidence of DVT.  He is on long term Xarelto.  Patient has a long standing history of an obstructive uropathy and elevated PVR.  He has undergone urethral dilatation on several occasions.  He underwent cystoscopy, balloon urethral dilatation, and bladder biopsy with Foley catheter placement on 03/06/2016.  Bladder biopsy revealed cystitis with reactive changes, negative for atypia and malignancy.  He was admitted to [facility] from 02/14/2016 - 02/16/2016 with chest pain.  Chest CT angiogram on 02/15/2016 ruled out pulmonary embolism. Imaging revealed slight progression of mediastinal left hilar adenopathy was pulmonary metastatic disease. Stress test on 02/15/2016 ruled out cardiac ischemia.  He was treated with Augmentin for presumed lower extremity cellulitis.  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  There is possible increase in change in the lytic lesion with some movement at the fracture site.  He has broken the distal locking screw.  He ambulates with a rolling walker.  Bone scan at [facility] on 06/22/2016 revealed no definite evidence of osseous metastatic disease.  There were photopenic regions overlying the left posterior eighth rib and the proximal left femur, likely representing prior surgical resection  He underwent removal of prior rod/intramedullary  nail, radical resection of left femur, resection of tumor, and left hemiarthroplasty at [facility] on 06/16/2016.  Pathology of the left proximal femur revealed metastatic carcinoma compatible with renal cell carcinoma.  He is scheduled to begin physical therapy.   He received 13 cycles of nivolumab (01/28/2016 - 06/06/2016; 08/08/2016 - 10/03/2016).  He tolerated his infusions well.  TSH was 6.239 (0.35-4.50) with a normal free T4 (0.79) on 02/25/2016.  TSH was 2.713 (normal) and free T4 0.79 (normal) on 05/19/2016.  Treatment was held on 05/19/2016 secondary to diarrhea and on 05/26/2016 secondary to unexplained hypotension.   Cortisol level was 7.9 with a repeat of 10.3 (normal) on 05/29/2016 at 8:26 AM.  ACTH was 39.1 (normal) at 10 AM on 05/26/2016.  Chest, abdomen, and pelvic CT on 10/20/2016 revealed progressive disease with enlarging left renal mass and pulmonary nodules.  Stable lytic lesion in posterior elements of T8.  Bone scan on 10/23/2016 revealed activity identified in the region of prior thoracic spine surgery. Local progression of concern.  There was uptake in the posterior left ninth rib is new since 12/23/2015 corresponds to lytic lesion on recent CT. This is compatible with metastatic disease.  There was uptake in the  distal left femur concerning for metastatic involvement.  There was anterior left seventh rib activity also concerning for metastatic deposit.  MRI of the cervical spine on 10/27/2016 revealed C4 body fracture with up to 75% height loss, likely pathologic.  There was no visible epidural or foraminal infiltration, but post-contrast imaging would be more sensitive.  On the symptomatic left side there is C4-5 and C5-6 left foraminal narrowing.  On the right, there is degenerative C3-4 and C6-7 foraminal impingement.  There was diffusely patent spinal canal.  He underwent  C4 corpectomy with C3-5 plating, T6-T10 PSF with tumor decompression, and T8 laminectomy on 10/30/2016.   Pathology of the T8 lesion and C4 lesion confirmed renal cell carcinoma.  He began cabozantinib (Cometriq) after clearance from surgery.   He developed progressive microcytic anemia c/w iron deficiency and anemia of chronic disease.  After his surgery at [facility] in 05/2016, he required 2 units of PRBCs.  MCV was 74.  Anemia work-up on 07/31/2016 revealed a ferritin 318 (falsely elevated secondary to ESR > 140), iron saturation 12%, TIBC 217 (low), and retic 2.5%.  He has received Venofer x 3 (08/22/2016, 09/05/2016, 09/19/2016, 11/10/2016, and 11/17/2016) with improvement in his hematocrit.  He has restrictive lung disease secondary to his weight and prior radiation.  He has been prescribed oxygen (not received).    His psoriasis flared on nivolumab, but has improved with topical steroids.  Psoriasis was back to baseline after 2 months off nivolumab.  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  Symptomatically, he has nausea associated with cabozantinib.  He has gained weight.  Pain is controlled.  Hematocrit has improved to 33.1.  Plan: 1.  Labs today:  CBC with diff, CMP, Mg. 2.  Physical therapy consult 3.  Radiation oncology consult today. 4.  Rx:  Ativan prn nausea. 5.  RTC in 2 weeks for labs (CBC with diff, CMP, Mg) 6.  RTC in 4 weeks for MD assessment, labs (CBC with diff, CMP, Mg, ferritin- labs day before) +/- Venofer  Addendum:  Spoke with radiation oncology.  Cabozantinib will be on hold during radiation.   [name], MD 11/30/2016, 8:52 AM

## 2016-12-01 ENCOUNTER — Ambulatory Visit
Admission: RE | Admit: 2016-12-01 | Discharge: 2016-12-01 | Disposition: A | Discharge status: Home / Self Care | Payer: Medicaid Other | Source: Ambulatory Visit | Attending: Radiation Oncology | Admitting: Radiation Oncology

## 2016-12-01 DIAGNOSIS — Z7901 Long term (current) use of anticoagulants: Secondary | ICD-10-CM | POA: Diagnosis not present

## 2016-12-01 DIAGNOSIS — F329 Major depressive disorder, single episode, unspecified: Secondary | ICD-10-CM | POA: Diagnosis not present

## 2016-12-01 DIAGNOSIS — I252 Old myocardial infarction: Secondary | ICD-10-CM | POA: Diagnosis not present

## 2016-12-01 DIAGNOSIS — Z8781 Personal history of (healed) traumatic fracture: Secondary | ICD-10-CM | POA: Diagnosis not present

## 2016-12-01 DIAGNOSIS — I509 Heart failure, unspecified: Secondary | ICD-10-CM | POA: Diagnosis not present

## 2016-12-01 DIAGNOSIS — R200 Anesthesia of skin: Secondary | ICD-10-CM | POA: Diagnosis not present

## 2016-12-01 DIAGNOSIS — Z79899 Other long term (current) drug therapy: Secondary | ICD-10-CM | POA: Diagnosis not present

## 2016-12-01 DIAGNOSIS — Z510 Encounter for antineoplastic radiation therapy: Secondary | ICD-10-CM | POA: Diagnosis present

## 2016-12-01 DIAGNOSIS — Z9221 Personal history of antineoplastic chemotherapy: Secondary | ICD-10-CM | POA: Diagnosis not present

## 2016-12-01 DIAGNOSIS — C7951 Secondary malignant neoplasm of bone: Secondary | ICD-10-CM | POA: Diagnosis not present

## 2016-12-01 DIAGNOSIS — E785 Hyperlipidemia, unspecified: Secondary | ICD-10-CM | POA: Diagnosis not present

## 2016-12-01 DIAGNOSIS — Z85828 Personal history of other malignant neoplasm of skin: Secondary | ICD-10-CM | POA: Diagnosis not present

## 2016-12-01 DIAGNOSIS — K219 Gastro-esophageal reflux disease without esophagitis: Secondary | ICD-10-CM | POA: Diagnosis not present

## 2016-12-01 DIAGNOSIS — R918 Other nonspecific abnormal finding of lung field: Secondary | ICD-10-CM | POA: Diagnosis not present

## 2016-12-01 DIAGNOSIS — M4802 Spinal stenosis, cervical region: Secondary | ICD-10-CM | POA: Diagnosis not present

## 2016-12-01 DIAGNOSIS — R531 Weakness: Secondary | ICD-10-CM | POA: Diagnosis not present

## 2016-12-01 DIAGNOSIS — C642 Malignant neoplasm of left kidney, except renal pelvis: Secondary | ICD-10-CM | POA: Diagnosis not present

## 2016-12-01 DIAGNOSIS — F419 Anxiety disorder, unspecified: Secondary | ICD-10-CM | POA: Diagnosis not present

## 2016-12-04 ENCOUNTER — Other Ambulatory Visit: Payer: Self-pay | Admitting: *Deleted

## 2016-12-05 ENCOUNTER — Ambulatory Visit
Admission: RE | Admit: 2016-12-05 | Discharge: 2016-12-05 | Disposition: A | Discharge status: Home / Self Care | Payer: Medicaid Other | Source: Ambulatory Visit | Attending: Radiation Oncology | Admitting: Radiation Oncology

## 2016-12-05 DIAGNOSIS — Z510 Encounter for antineoplastic radiation therapy: Secondary | ICD-10-CM | POA: Diagnosis not present

## 2016-12-06 ENCOUNTER — Ambulatory Visit
Admission: RE | Admit: 2016-12-06 | Discharge: 2016-12-06 | Disposition: A | Discharge status: Home / Self Care | Payer: Medicaid Other | Source: Ambulatory Visit | Attending: Radiation Oncology | Admitting: Radiation Oncology

## 2016-12-06 DIAGNOSIS — Z510 Encounter for antineoplastic radiation therapy: Secondary | ICD-10-CM | POA: Diagnosis not present

## 2016-12-07 ENCOUNTER — Ambulatory Visit
Admission: RE | Admit: 2016-12-07 | Discharge: 2016-12-07 | Disposition: A | Discharge status: Home / Self Care | Payer: Medicaid Other | Source: Ambulatory Visit | Attending: Radiation Oncology | Admitting: Radiation Oncology

## 2016-12-07 DIAGNOSIS — Z510 Encounter for antineoplastic radiation therapy: Secondary | ICD-10-CM | POA: Diagnosis not present

## 2016-12-08 ENCOUNTER — Ambulatory Visit
Admission: RE | Admit: 2016-12-08 | Discharge: 2016-12-08 | Disposition: A | Discharge status: Home / Self Care | Payer: Medicaid Other | Source: Ambulatory Visit | Attending: Radiation Oncology | Admitting: Radiation Oncology

## 2016-12-08 DIAGNOSIS — Z510 Encounter for antineoplastic radiation therapy: Secondary | ICD-10-CM | POA: Diagnosis not present

## 2016-12-11 ENCOUNTER — Ambulatory Visit
Admission: RE | Admit: 2016-12-11 | Discharge: 2016-12-11 | Disposition: A | Discharge status: Home / Self Care | Payer: Medicaid Other | Source: Ambulatory Visit | Attending: Radiation Oncology | Admitting: Radiation Oncology

## 2016-12-11 DIAGNOSIS — Z510 Encounter for antineoplastic radiation therapy: Secondary | ICD-10-CM | POA: Diagnosis not present

## 2016-12-12 ENCOUNTER — Other Ambulatory Visit: Payer: Self-pay | Admitting: *Deleted

## 2016-12-12 ENCOUNTER — Ambulatory Visit
Admission: RE | Admit: 2016-12-12 | Discharge: 2016-12-12 | Disposition: A | Discharge status: Home / Self Care | Payer: Medicaid Other | Source: Ambulatory Visit | Attending: Radiation Oncology | Admitting: Radiation Oncology

## 2016-12-12 DIAGNOSIS — Z510 Encounter for antineoplastic radiation therapy: Secondary | ICD-10-CM | POA: Diagnosis not present

## 2016-12-13 ENCOUNTER — Telehealth: Payer: Self-pay | Admitting: *Deleted

## 2016-12-13 ENCOUNTER — Ambulatory Visit: Payer: Medicaid Other

## 2016-12-13 NOTE — Telephone Encounter (Signed)
Called and left patient a message that he should NOT be taking his cabozantinib while on radiation, instructed to call back with questions.

## 2016-12-14 ENCOUNTER — Inpatient Hospital Stay: Payer: Medicaid Other

## 2016-12-14 ENCOUNTER — Ambulatory Visit
Admission: RE | Admit: 2016-12-14 | Discharge: 2016-12-14 | Disposition: A | Discharge status: Home / Self Care | Payer: Medicaid Other | Source: Ambulatory Visit | Attending: Radiation Oncology | Admitting: Radiation Oncology

## 2016-12-14 DIAGNOSIS — M1990 Unspecified osteoarthritis, unspecified site: Secondary | ICD-10-CM

## 2016-12-14 DIAGNOSIS — Z7189 Other specified counseling: Secondary | ICD-10-CM

## 2016-12-14 DIAGNOSIS — D649 Anemia, unspecified: Secondary | ICD-10-CM

## 2016-12-14 DIAGNOSIS — C7951 Secondary malignant neoplasm of bone: Secondary | ICD-10-CM

## 2016-12-14 DIAGNOSIS — C642 Malignant neoplasm of left kidney, except renal pelvis: Secondary | ICD-10-CM

## 2016-12-14 DIAGNOSIS — F419 Anxiety disorder, unspecified: Secondary | ICD-10-CM

## 2016-12-14 DIAGNOSIS — C649 Malignant neoplasm of unspecified kidney, except renal pelvis: Secondary | ICD-10-CM

## 2016-12-14 DIAGNOSIS — C7800 Secondary malignant neoplasm of unspecified lung: Secondary | ICD-10-CM

## 2016-12-14 DIAGNOSIS — G893 Neoplasm related pain (acute) (chronic): Secondary | ICD-10-CM

## 2016-12-14 DIAGNOSIS — Z510 Encounter for antineoplastic radiation therapy: Secondary | ICD-10-CM | POA: Diagnosis not present

## 2016-12-14 DIAGNOSIS — I82532 Chronic embolism and thrombosis of left popliteal vein: Secondary | ICD-10-CM

## 2016-12-14 LAB — CBC WITH DIFFERENTIAL/PLATELET
Basophils Absolute: 0 10*3/uL (ref 0–0.1)
Basophils Relative: 1 %
Eosinophils Absolute: 0.3 10*3/uL (ref 0–0.7)
Eosinophils Relative: 9 %
HCT: 33.9 % — ABNORMAL LOW (ref 40.0–52.0)
Hemoglobin: 10.7 g/dL — ABNORMAL LOW (ref 13.0–18.0)
Lymphocytes Relative: 14 %
Lymphs Abs: 0.5 10*3/uL — ABNORMAL LOW (ref 1.0–3.6)
MCH: 24 pg — ABNORMAL LOW (ref 26.0–34.0)
MCHC: 31.7 g/dL — ABNORMAL LOW (ref 32.0–36.0)
MCV: 75.8 fL — ABNORMAL LOW (ref 80.0–100.0)
Monocytes Absolute: 0.3 10*3/uL (ref 0.2–1.0)
Monocytes Relative: 8 %
Neutro Abs: 2.2 10*3/uL (ref 1.4–6.5)
Neutrophils Relative %: 68 %
Platelets: 173 10*3/uL (ref 150–440)
RBC: 4.47 MIL/uL (ref 4.40–5.90)
RDW: 20.9 % — ABNORMAL HIGH (ref 11.5–14.5)
WBC: 3.3 10*3/uL — ABNORMAL LOW (ref 3.8–10.6)

## 2016-12-14 LAB — COMPREHENSIVE METABOLIC PANEL
ALT: 15 U/L — ABNORMAL LOW (ref 17–63)
AST: 21 U/L (ref 15–41)
Albumin: 3.4 g/dL — ABNORMAL LOW (ref 3.5–5.0)
Alkaline Phosphatase: 108 U/L (ref 38–126)
Anion gap: 7 (ref 5–15)
BUN: 17 mg/dL (ref 6–20)
CO2: 30 mmol/L (ref 22–32)
Calcium: 9 mg/dL (ref 8.9–10.3)
Chloride: 103 mmol/L (ref 101–111)
Creatinine, Ser: 0.97 mg/dL (ref 0.61–1.24)
GFR calc Af Amer: >60 mL/min (ref >60)
GFR calc non Af Amer: >60 mL/min (ref >60)
Glucose, Bld: 126 mg/dL — ABNORMAL HIGH (ref 65–99)
Potassium: 4 mmol/L (ref 3.5–5.1)
Sodium: 140 mmol/L (ref 135–145)
Total Bilirubin: 0.3 mg/dL (ref 0.3–1.2)
Total Protein: 7.4 g/dL (ref 6.5–8.1)

## 2016-12-14 LAB — MAGNESIUM: Magnesium: 2.1 mg/dL (ref 1.7–2.4)

## 2016-12-15 ENCOUNTER — Ambulatory Visit
Admission: RE | Admit: 2016-12-15 | Discharge: 2016-12-15 | Disposition: A | Discharge status: Home / Self Care | Payer: Medicaid Other | Source: Ambulatory Visit | Attending: Radiation Oncology | Admitting: Radiation Oncology

## 2016-12-15 DIAGNOSIS — Z510 Encounter for antineoplastic radiation therapy: Secondary | ICD-10-CM | POA: Diagnosis not present

## 2016-12-18 ENCOUNTER — Ambulatory Visit
Admission: RE | Admit: 2016-12-18 | Discharge: 2016-12-18 | Disposition: A | Discharge status: Home / Self Care | Payer: Medicaid Other | Source: Ambulatory Visit | Attending: Radiation Oncology | Admitting: Radiation Oncology

## 2016-12-18 DIAGNOSIS — Z510 Encounter for antineoplastic radiation therapy: Secondary | ICD-10-CM | POA: Diagnosis not present

## 2016-12-19 ENCOUNTER — Ambulatory Visit: Payer: Medicaid Other

## 2016-12-19 ENCOUNTER — Ambulatory Visit
Admission: RE | Admit: 2016-12-19 | Discharge: 2016-12-19 | Disposition: A | Discharge status: Home / Self Care | Payer: Medicaid Other | Source: Ambulatory Visit | Attending: Radiation Oncology | Admitting: Radiation Oncology

## 2016-12-19 DIAGNOSIS — Z510 Encounter for antineoplastic radiation therapy: Secondary | ICD-10-CM | POA: Diagnosis not present

## 2016-12-21 ENCOUNTER — Ambulatory Visit
Admission: RE | Admit: 2016-12-21 | Discharge: 2016-12-21 | Disposition: A | Discharge status: Home / Self Care | Payer: Medicaid Other | Source: Ambulatory Visit | Attending: Radiation Oncology | Admitting: Radiation Oncology

## 2016-12-21 DIAGNOSIS — Z510 Encounter for antineoplastic radiation therapy: Secondary | ICD-10-CM | POA: Diagnosis not present

## 2016-12-27 ENCOUNTER — Telehealth: Payer: Self-pay | Admitting: *Deleted

## 2016-12-27 ENCOUNTER — Inpatient Hospital Stay: Payer: Medicaid Other | Attending: Hematology and Oncology

## 2016-12-27 DIAGNOSIS — Z86718 Personal history of other venous thrombosis and embolism: Secondary | ICD-10-CM | POA: Insufficient documentation

## 2016-12-27 DIAGNOSIS — Z8781 Personal history of (healed) traumatic fracture: Secondary | ICD-10-CM | POA: Insufficient documentation

## 2016-12-27 DIAGNOSIS — K219 Gastro-esophageal reflux disease without esophagitis: Secondary | ICD-10-CM | POA: Insufficient documentation

## 2016-12-27 DIAGNOSIS — Z85828 Personal history of other malignant neoplasm of skin: Secondary | ICD-10-CM | POA: Insufficient documentation

## 2016-12-27 DIAGNOSIS — I10 Essential (primary) hypertension: Secondary | ICD-10-CM | POA: Insufficient documentation

## 2016-12-27 DIAGNOSIS — Z8619 Personal history of other infectious and parasitic diseases: Secondary | ICD-10-CM | POA: Insufficient documentation

## 2016-12-27 DIAGNOSIS — L409 Psoriasis, unspecified: Secondary | ICD-10-CM | POA: Insufficient documentation

## 2016-12-27 DIAGNOSIS — Z7901 Long term (current) use of anticoagulants: Secondary | ICD-10-CM | POA: Insufficient documentation

## 2016-12-27 DIAGNOSIS — I509 Heart failure, unspecified: Secondary | ICD-10-CM | POA: Insufficient documentation

## 2016-12-27 DIAGNOSIS — Z923 Personal history of irradiation: Secondary | ICD-10-CM | POA: Insufficient documentation

## 2016-12-27 DIAGNOSIS — E785 Hyperlipidemia, unspecified: Secondary | ICD-10-CM | POA: Insufficient documentation

## 2016-12-27 DIAGNOSIS — Z79899 Other long term (current) drug therapy: Secondary | ICD-10-CM | POA: Insufficient documentation

## 2016-12-27 DIAGNOSIS — I252 Old myocardial infarction: Secondary | ICD-10-CM | POA: Insufficient documentation

## 2016-12-27 DIAGNOSIS — F419 Anxiety disorder, unspecified: Secondary | ICD-10-CM | POA: Insufficient documentation

## 2016-12-27 DIAGNOSIS — M129 Arthropathy, unspecified: Secondary | ICD-10-CM | POA: Insufficient documentation

## 2016-12-27 DIAGNOSIS — F329 Major depressive disorder, single episode, unspecified: Secondary | ICD-10-CM | POA: Insufficient documentation

## 2016-12-27 DIAGNOSIS — Z599 Problem related to housing and economic circumstances, unspecified: Secondary | ICD-10-CM | POA: Insufficient documentation

## 2016-12-27 DIAGNOSIS — C649 Malignant neoplasm of unspecified kidney, except renal pelvis: Secondary | ICD-10-CM | POA: Insufficient documentation

## 2016-12-27 NOTE — Telephone Encounter (Addendum)
Patient called stating he has had a miserable last couple of days. He has an appt tomorrow, but is requesting that Rodena Piety call him today. [phone]

## 2016-12-27 NOTE — Telephone Encounter (Signed)
Called patient

## 2016-12-27 NOTE — Telephone Encounter (Signed)
Return call to patient about not feeling good.  Patient reports that he thinks he is feeling better at this moment but has had a bad couple of days.  Patient reports that he will come to appt tomorrow, patient instructed to call back if he wasn't going to be able to make it, voiced understanding.

## 2016-12-28 ENCOUNTER — Inpatient Hospital Stay (HOSPITAL_BASED_OUTPATIENT_CLINIC_OR_DEPARTMENT_OTHER): Payer: Medicaid Other | Admitting: Hematology and Oncology

## 2016-12-28 ENCOUNTER — Encounter: Payer: Self-pay | Admitting: Hematology and Oncology

## 2016-12-28 ENCOUNTER — Inpatient Hospital Stay: Payer: Medicaid Other

## 2016-12-28 DIAGNOSIS — Z86718 Personal history of other venous thrombosis and embolism: Secondary | ICD-10-CM | POA: Diagnosis not present

## 2016-12-28 DIAGNOSIS — I509 Heart failure, unspecified: Secondary | ICD-10-CM

## 2016-12-28 DIAGNOSIS — K219 Gastro-esophageal reflux disease without esophagitis: Secondary | ICD-10-CM | POA: Diagnosis not present

## 2016-12-28 DIAGNOSIS — Z8619 Personal history of other infectious and parasitic diseases: Secondary | ICD-10-CM | POA: Diagnosis not present

## 2016-12-28 DIAGNOSIS — M129 Arthropathy, unspecified: Secondary | ICD-10-CM | POA: Diagnosis not present

## 2016-12-28 DIAGNOSIS — L409 Psoriasis, unspecified: Secondary | ICD-10-CM

## 2016-12-28 DIAGNOSIS — I252 Old myocardial infarction: Secondary | ICD-10-CM

## 2016-12-28 DIAGNOSIS — C649 Malignant neoplasm of unspecified kidney, except renal pelvis: Secondary | ICD-10-CM | POA: Diagnosis present

## 2016-12-28 DIAGNOSIS — G893 Neoplasm related pain (acute) (chronic): Secondary | ICD-10-CM

## 2016-12-28 DIAGNOSIS — F329 Major depressive disorder, single episode, unspecified: Secondary | ICD-10-CM | POA: Diagnosis not present

## 2016-12-28 DIAGNOSIS — C7800 Secondary malignant neoplasm of unspecified lung: Secondary | ICD-10-CM

## 2016-12-28 DIAGNOSIS — E785 Hyperlipidemia, unspecified: Secondary | ICD-10-CM | POA: Diagnosis not present

## 2016-12-28 DIAGNOSIS — C7951 Secondary malignant neoplasm of bone: Secondary | ICD-10-CM

## 2016-12-28 DIAGNOSIS — F419 Anxiety disorder, unspecified: Secondary | ICD-10-CM | POA: Diagnosis not present

## 2016-12-28 DIAGNOSIS — Z7901 Long term (current) use of anticoagulants: Secondary | ICD-10-CM | POA: Diagnosis not present

## 2016-12-28 DIAGNOSIS — I82532 Chronic embolism and thrombosis of left popliteal vein: Secondary | ICD-10-CM

## 2016-12-28 DIAGNOSIS — C642 Malignant neoplasm of left kidney, except renal pelvis: Secondary | ICD-10-CM

## 2016-12-28 DIAGNOSIS — Z599 Problem related to housing and economic circumstances, unspecified: Secondary | ICD-10-CM

## 2016-12-28 DIAGNOSIS — Z8781 Personal history of (healed) traumatic fracture: Secondary | ICD-10-CM | POA: Diagnosis not present

## 2016-12-28 DIAGNOSIS — I10 Essential (primary) hypertension: Secondary | ICD-10-CM | POA: Diagnosis not present

## 2016-12-28 DIAGNOSIS — Z79899 Other long term (current) drug therapy: Secondary | ICD-10-CM | POA: Diagnosis not present

## 2016-12-28 DIAGNOSIS — D649 Anemia, unspecified: Secondary | ICD-10-CM

## 2016-12-28 DIAGNOSIS — Z923 Personal history of irradiation: Secondary | ICD-10-CM | POA: Diagnosis not present

## 2016-12-28 DIAGNOSIS — M1990 Unspecified osteoarthritis, unspecified site: Secondary | ICD-10-CM

## 2016-12-28 DIAGNOSIS — Z85828 Personal history of other malignant neoplasm of skin: Secondary | ICD-10-CM | POA: Diagnosis not present

## 2016-12-28 DIAGNOSIS — Z7189 Other specified counseling: Secondary | ICD-10-CM

## 2016-12-28 LAB — COMPREHENSIVE METABOLIC PANEL
ALT: 16 U/L — ABNORMAL LOW (ref 17–63)
AST: 26 U/L (ref 15–41)
Albumin: 4.1 g/dL (ref 3.5–5.0)
Alkaline Phosphatase: 99 U/L (ref 38–126)
Anion gap: 13 (ref 5–15)
BUN: 27 mg/dL — ABNORMAL HIGH (ref 6–20)
CO2: 29 mmol/L (ref 22–32)
Calcium: 10.5 mg/dL — ABNORMAL HIGH (ref 8.9–10.3)
Chloride: 99 mmol/L — ABNORMAL LOW (ref 101–111)
Creatinine, Ser: 1.48 mg/dL — ABNORMAL HIGH (ref 0.61–1.24)
GFR calc Af Amer: 58 mL/min — ABNORMAL LOW (ref >60)
GFR calc non Af Amer: 50 mL/min — ABNORMAL LOW (ref >60)
Glucose, Bld: 101 mg/dL — ABNORMAL HIGH (ref 65–99)
Potassium: 3.7 mmol/L (ref 3.5–5.1)
Sodium: 141 mmol/L (ref 135–145)
Total Bilirubin: 0.4 mg/dL (ref 0.3–1.2)
Total Protein: 8.8 g/dL — ABNORMAL HIGH (ref 6.5–8.1)

## 2016-12-28 LAB — IRON AND TIBC
Iron: 56 ug/dL (ref 45–182)
Saturation Ratios: 18 % (ref 17.9–39.5)
TIBC: 304 ug/dL (ref 250–450)
UIBC: 248 ug/dL

## 2016-12-28 LAB — CBC WITH DIFFERENTIAL/PLATELET
Basophils Absolute: 0.1 10*3/uL (ref 0–0.1)
Basophils Relative: 1 %
Eosinophils Absolute: 0.3 10*3/uL (ref 0–0.7)
Eosinophils Relative: 4 %
HCT: 42 % (ref 40.0–52.0)
Hemoglobin: 13.5 g/dL (ref 13.0–18.0)
Lymphocytes Relative: 9 %
Lymphs Abs: 0.6 10*3/uL — ABNORMAL LOW (ref 1.0–3.6)
MCH: 24.9 pg — ABNORMAL LOW (ref 26.0–34.0)
MCHC: 32.2 g/dL (ref 32.0–36.0)
MCV: 77.6 fL — ABNORMAL LOW (ref 80.0–100.0)
Monocytes Absolute: 1.1 10*3/uL — ABNORMAL HIGH (ref 0.2–1.0)
Monocytes Relative: 17 %
Neutro Abs: 4.5 10*3/uL (ref 1.4–6.5)
Neutrophils Relative %: 69 %
Platelets: 233 10*3/uL (ref 150–440)
RBC: 5.41 MIL/uL (ref 4.40–5.90)
RDW: 23.5 % — ABNORMAL HIGH (ref 11.5–14.5)
WBC: 6.5 10*3/uL (ref 3.8–10.6)

## 2016-12-28 LAB — MAGNESIUM: Magnesium: 2.2 mg/dL (ref 1.7–2.4)

## 2016-12-28 LAB — FERRITIN: Ferritin: 254 ng/mL (ref 24–336)

## 2016-12-28 MED ORDER — LORAZEPAM 0.5 MG PO TABS: 0.5000 mg | ORAL_TABLET | Freq: Three times a day (TID) | ORAL | 0 refills | Status: DC

## 2016-12-28 NOTE — Progress Notes (Signed)
Patient states he had a bad day Monday.  States he was unable to swallow anything.  Resolved yesterday.  Patient states he is depressed.  Requesting refill for Lorazepam.  Patient continues to have cold sweats.

## 2016-12-28 NOTE — Progress Notes (Signed)
[facility] day:  12/28/2016   Chief Complaint: Marc Blake is a 61 y.o. male with metastatic renal cell carcinoma who is seen for reassessment after completion of radiation.  HPI: The patient was last seen in the medical oncology clinic on 11/30/2016.  At that time, he was tolerating cabazantinib well. He denied any side effects except for a little queeziness in the morning.  Physical therapy consult was requested.  He received radiation from 12/05/2016 - 12/21/2016.  Per radiation therapy request, cabazatinib was held.  Symptomatically, he feels "rough, queasy, and drained". On 12/25/2016, he had trouble swallowing and was "spitting up".  He had some sweats.  Today, his throat is better.  He is under a lot of stress as he received an eviction notice.   Past Medical History:  Diagnosis Date  . Acid reflux   . Anxiety   . Arthritis   . CHF (congestive heart failure) ([place])   . Depression   . Depression   . Hyperlipidemia   . Hypertension   . Myocardial infarction ([place])   . Renal cancer ([place])   . Renal cell carcinoma ([place])   . Renal insufficiency   . Skin cancer     Past Surgical History:  Procedure Laterality Date  . BACK SURGERY    . CARDIAC CATHETERIZATION     with stent  . CYSTOSCOPY WITH BIOPSY  03/06/2016   Procedure: CYSTOSCOPY WITH BIOPSY;  Surgeon: [name], MD;  Location: [facility] ORS;  Service: Urology;;  . [name] WITH URETHRAL DILATATION N/A 03/06/2016   Procedure: CYSTOSCOPY WITH URETHRAL DILATATION WITH CATHETER PLACEMENT;  Surgeon: [name], MD;  Location: [facility] ORS;  Service: Urology;  Laterality: N/A;  . KNEE SURGERY Left   . LEG SURGERY Left   . TONSILLECTOMY      Family History  Problem Relation Age of Onset  . Hypertension Father   . Heart attack Father   . Stroke Sister   . Hypertension Brother   . Stroke Maternal Uncle     Social History:  reports that he has never smoked. He has never  used smokeless tobacco. He reports that he does not drink alcohol or use drugs.  He is originally from [place].  He then lived in [place] for 17 years.  He moved to [place] with his wife.  He lost his job of 35 years in early 09/2016.  He recently received an eviction notice.  Allergies: No Known Allergies  Current Medications: Current Outpatient Prescriptions  Medication Sig Dispense Refill  . amLODipine (NORVASC) 10 MG tablet Take 10 mg by mouth daily.  3  . atorvastatin (LIPITOR) 20 MG tablet TAKE 1 TABLET ONCE A DAY (AT BEDTIME) FOR 30 DAYS  2  . busPIRone (BUSPAR) 7.5 MG tablet     . carvedilol (COREG) 12.5 MG tablet TAKE 1 TABLET BY MOUTH TWICE A DAY FOR 30 DAYS  3  . [employer] GENTLE LAXATIVE 10 MG suppository INSERT 1 SUPPOSITORY RECTALLY EVERY 24HRS AS NEEDED FOR CONSTIPATION  0  . [employer] STOOL SOFTENER 8.6-50 MG tablet TAKE 1 TABLET BY MOUTH TWICE A DAY AS NEEDED CONSTIPATION  0  . fluocinonide cream (LIDEX) 0.05 %     . gabapentin (NEURONTIN) 300 MG capsule Take 300 mg by mouth 3 (three) times daily.     Marland Kitchen ibuprofen (ADVIL,MOTRIN) 200 MG tablet Take 200 mg by mouth every 8 (eight) hours as needed for moderate pain. 4 every 8 hours    .  ipratropium-albuterol (DUONEB) 0.5-2.5 (3) MG/3ML SOLN     . lisinopril (PRINIVIL,ZESTRIL) 10 MG tablet     . LORazepam (ATIVAN) 0.5 MG tablet Take 1 tablet (0.5 mg total) by mouth every 8 (eight) hours. 30 tablet 0  . morphine (MS CONTIN) 30 MG 12 hr tablet Take 1 tablet (30 mg total) by mouth every 8 (eight) hours. 90 tablet 0  . oxyCODONE (OXY IR/ROXICODONE) 5 MG immediate release tablet Take 10 mg by mouth every 6 (six) hours as needed (every 6 h prn).     . potassium chloride SA (K-DUR,KLOR-CON) 20 MEQ tablet Take 1 tablet (20 mEq total) by mouth daily. For 3 days 30 tablet 0  . senna (SENOKOT) 8.6 MG tablet Take 2 tablets by mouth at bedtime.     . sertraline (ZOLOFT) 100 MG tablet Take 100 mg by mouth at bedtime.  1  . traZODone (DESYREL) 50 MG  tablet Take 50 mg by mouth at bedtime.   3  . triamcinolone cream (KENALOG) 0.5 % Apply topically 2 (two) times daily. 30 g 1  . XARELTO 20 MG TABS tablet Take 20 mg by mouth daily.  1  . tenofovir (VIREAD) 300 MG tablet Take 1 tablet (300 mg total) by mouth daily. (Patient not taking: Reported on 11/10/2016) 30 tablet 11   Current Facility-Administered Medications  Medication Dose Route Frequency Provider Last Rate Last Dose  . 0.9 %  sodium chloride infusion   Intravenous Once [name], MD        Review of Systems:  GENERAL:  Feels "rough".  No fevers.  Some sweats.  Weight down 11 pounds in 1 month. PERFORMANCE STATUS (ECOG):  2-3 HEENT:  No visual changes, runny nose, sore throat, mouth sores or tenderness.  Waiting on decision to fix teeth. Lungs:  Shortness of breath on exertion.  No cough or wheezing.  No hemoptysis. Cardiac:  No chest pain, palpitations, orthopnea, or PND. GI:  Nausea.  Swallowing issues s/p radiation, improving.  No vomiting, diarrhea, melena or hematochezia. GU:  No urgency, frequency, dysuria, or hematuria. Self caths 3-4 x/week. Musculoskeletal:  Back and shoulder pain.  No muscle tenderness. Extremities:  Chronic left lower extremity swelling. Skin:  Eczema on elbows and back. Neuro:  Poor memory.  No focal numbness.  Decreased movement in left arm (improved).  No headache.  Decreased feeling left leg (chronic). Endocrine:  No diabetes, thyroid issues, hot flashes or night sweats. Psych:  Depressed secondary to financial concerns. Pain: Back pain. Review of systems:  All other systems reviewed and found to be negative.  Physical Exam: Blood pressure 116/77, pulse (!) 121, temperature (!) 95.9 F (35.5 C), temperature source Tympanic, resp. rate 18, weight 213 lb 5 oz (96.8 kg). GENERAL:  Fatigued appearing gentleman sitting comfortably in a chair in the exam room in no acute distress.  MENTAL STATUS:  Alert and oriented to person, place and  time. HEAD:  Long gray hair with goatee.  Normocephalic, atraumatic, face symmetric, no Cushingoid features. EYES:  Pupils equal round and reactive to light and accomodation.  No conjunctivitis or scleral icterus. ENT:  Oropharynx clear without lesion.  Poor dentition.  Tongue normal. Mucous membranes moist.  NECK:  Brace in place. RESPIRATORY:  Clear to auscultation without rales, wheezes or rhonchi. CARDIOVASCULAR:  Regular rate and rhythm without murmur, rub or gallop.  ABDOMEN:  Soft, non-tender, with active bowel sounds, and no hepatosplenomegaly.  No masses. SKIN:  Tattoos. Eczema on dorsal surface of  forearms and lower back, minimal. EXTREMITIES:  No skin discoloration or tenderness.  No palpable cords. LYMPH NODES: No palpable cervical, supraclavicular, axillary or inguinal adenopathy  NEUROLOGICAL:  Decreased ROM left arm, improved.  Grips strength symmetric. PSYCH:  Appropriate.  . Imaging studies: 12/28/2013:  Abdominal and pelvic CT scan revealed an 8.5 x 10.2 x 9.2 cm irregularly enhancing mass of the left kidney with some internal calcifications. There were enlarged left sided renal hilar lymph nodes. There were multiple pulmonary nodules in both lower lobes (largest 1.1 cm). There was a destructive bone lesion with an irregular 8.6 x 5.1 cm soft tissue mass invasive of the spinal canal narrowing at least 50% and disturbing the pedicle and portions of the left transverse process rib and T8 vertebral body.  01/15/2014:  Chest CT revealed innumerable pulmonary nodules, a large metastatic lesion at T8 and left eighth rib with invasion into the spinal canal and moderate canal stenosis. There was a left upper pole right kidney mass.  Head MRI was negative. 05/31/2015:  Chest, abdomen, and pelvic CT revealed slight enlargement of multiple pulmonary nodules.  The left renal mass was similar (12.2 x 9.6 cm).  The right kidney lesion was stable (lower pole lesion 2 x 1.7 cm).  Bone scan on  05/31/2015 revealed slight increased activity in the left costal vertebral junction at T7. 12/14/2015:  Chest, abdomen, and pelvic CT revealed an irregular 10.7 cm heterogeneous renal cortical mass in the upper left kidney and a heterogeneous 2.3 cm renal cortical mass in the lateral lower right kidney.  There was mild bilateral hydroureteronephrosis with bladder distention.  There was mild left para-aortic lymphadenopathy.  There were numerous (greater than 20) pulmonary metastases.  Largest nodule was 2.1 cm in the RLL. There was a partially visualized mildly expansile lytic lesion in the left proximal femur status post surgical transfixation.  Thre was a small probably loculated pleural effusion at the left eighth rib resection site.  There were no other bone abnormalities. 12/24/2015:  Bone scan revealed photopenia from prior removal of the left eighth rib. There was increased  uptake in the medial thoracic spine from T7 and T9 is felt to be due to postoperative fusion.  There was increased uptake throughout much of the left femur as well as increased uptake in the soft tissues of much of the left femur which may be due to previous surgery and radiation therapy change. 03/10/2016:  Chest CT revealed unchanged bilateral pulmonary nodules and a left renal mass. 08/08/2016:  Chest, abdomen, and pelvic CT revealed decreasing left kidney lesion, stable to increase in multifocal pulmonary nodules and a similar appearance of prominent upper abdominal lymph nodes that were worrisome for metastatic adenopathy. 10/20/2016:  Chest, abdomen, and pelvic CT revealed progressive disease with enlarging left renal mass and pulmonary nodules.  Stable lytic lesion in posterior elements of T8.   10/23/2016:  Bone scan revealed activity identified in the region of prior thoracic spine surgery. Local progression of concern.  There was uptake in the posterior left ninth rib was new since 12/23/2015 corresponds to lytic lesion on  recent CT. This is compatible with metastatic disease.  There was uptake in the distal left femur concerning for metastatic involvement.  There was anterior left seventh rib activity also concerning for metastatic deposit. 10/27/2016:  MRI of the cervical spine revealed C4 body fracture with up to 75% height loss, likely pathologic.  There was no visible epidural or foraminal infiltration, but post-contrast imaging would be  more sensitive.  On the symptomatic left side there is C4-5 and C5-6 left foraminal narrowing.  On the right, there is degenerative C3-4 and C6-7 foraminal impingement.  There was diffusely patent spinal canal. 10/28/2016: [facility] revealed C4 pathologic fracture with minimal retropulsion and moderate canal stenosis. New enhancing soft tissue lesion in posterior elements of T8 causing anterior displacement with severe stenosis without cord signal abnormality.   No visits with results within 3 Day(s) from this visit.  Latest known visit with results is:  Appointment on 12/14/2016  Component Date Value Ref Range Status  . WBC 12/14/2016 3.3* 3.8 - 10.6 K/uL Final  . RBC 12/14/2016 4.47  4.40 - 5.90 MIL/uL Final  . Hemoglobin 12/14/2016 10.7* 13.0 - 18.0 g/dL Final  . HCT 12/14/2016 33.9* 40.0 - 52.0 % Final  . MCV 12/14/2016 75.8* 80.0 - 100.0 fL Final  . MCH 12/14/2016 24.0* 26.0 - 34.0 pg Final  . MCHC 12/14/2016 31.7* 32.0 - 36.0 g/dL Final  . RDW 12/14/2016 20.9* 11.5 - 14.5 % Final  . Platelets 12/14/2016 173  150 - 440 K/uL Final  . Neutrophils Relative % 12/14/2016 68  % Final  . Neutro Abs 12/14/2016 2.2  1.4 - 6.5 K/uL Final  . Lymphocytes Relative 12/14/2016 14  % Final  . Lymphs Abs 12/14/2016 0.5* 1.0 - 3.6 K/uL Final  . Monocytes Relative 12/14/2016 8  % Final  . Monocytes Absolute 12/14/2016 0.3  0.2 - 1.0 K/uL Final  . Eosinophils Relative 12/14/2016 9  % Final  . Eosinophils Absolute 12/14/2016 0.3  0 - 0.7 K/uL Final  . Basophils Relative 12/14/2016 1   % Final  . Basophils Absolute 12/14/2016 0.0  0 - 0.1 K/uL Final  . Sodium 12/14/2016 140  135 - 145 mmol/L Final  . Potassium 12/14/2016 4.0  3.5 - 5.1 mmol/L Final  . Chloride 12/14/2016 103  101 - 111 mmol/L Final  . CO2 12/14/2016 30  22 - 32 mmol/L Final  . Glucose, Bld 12/14/2016 126* 65 - 99 mg/dL Final  . BUN 12/14/2016 17  6 - 20 mg/dL Final  . Creatinine, Ser 12/14/2016 0.97  0.61 - 1.24 mg/dL Final  . Calcium 12/14/2016 9.0  8.9 - 10.3 mg/dL Final  . Total Protein 12/14/2016 7.4  6.5 - 8.1 g/dL Final  . Albumin 12/14/2016 3.4* 3.5 - 5.0 g/dL Final  . AST 12/14/2016 21  15 - 41 U/L Final  . ALT 12/14/2016 15* 17 - 63 U/L Final  . Alkaline Phosphatase 12/14/2016 108  38 - 126 U/L Final  . Total Bilirubin 12/14/2016 0.3  0.3 - 1.2 mg/dL Final  . GFR calc non Af Amer 12/14/2016 >60  >60 mL/min Final  . GFR calc Af Amer 12/14/2016 >60  >60 mL/min Final   Comment: (NOTE) The eGFR has been calculated using the CKD EPI equation. This calculation has not been validated in all clinical situations. eGFR's persistently <60 mL/min signify possible Chronic Kidney Disease.   . Anion gap 12/14/2016 7  5 - 15 Final  . Magnesium 12/14/2016 2.1  1.7 - 2.4 mg/dL Final    Assessment:  Marc Blake is a 61 y.o. male with metastatic renal cell carcinoma presenting in 12/2013.  He had a large left renal mass, rib and vertebral body involvement, and multiple pulmonary nodules.  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    He was on Votrient (pazopanib) from 02/13/2014 -  01/21/2016.  He began nivolumab on 01/28/2016.  He received 3000 cGy to T7 - T9 and associated ribs beginning 05/11/2014.  On 06/15/2014, he received 1 dose of nivolumab.  He presented with progressive paralysis from the waist down on 07/06/2014.  He underwent left posterolateral thoracotomy with left chest wall resection with T8 corpectomy and interbody fusion with donor bone on  07/06/2014.  Pathology revealed metastatic renal cell carcinoma. Post-operatively, he developed sepsis secondary to a submandibular abscess dental abscess.  He underwent tracheostomy, I&D of a right neck abscess, tooth extraction, and PEG tube placement.  Zometa has subsequently been on hold.  He developed a pathologic fracture of the left femur.  He is s/p intramedullary nailing on 01/27/2015.  He received radiation to the left leg.  He was admitted on 04/26/2015 with aspiration pneumonia and opioid overdose. He is currently taking MSContin 15 mg BID and oxycodone 5 mg q day.  He developed a left lower extremity DVT (popliteal and soleal vein) on 06/07/2015.  Left lower extremity ultrasound on 02/07/2016 revealed no evidence of DVT.  He is on long term Xarelto.  Patient has a long standing history of an obstructive uropathy and elevated PVR.  He has undergone urethral dilatation on several occasions.  He underwent cystoscopy, balloon urethral dilatation, and bladder biopsy with Foley catheter placement on 03/06/2016.  Bladder biopsy revealed cystitis with reactive changes, negative for atypia and malignancy.  He was admitted to [facility] from 02/14/2016 - 02/16/2016 with chest pain.  Chest CT angiogram on 02/15/2016 ruled out pulmonary embolism. Imaging revealed slight progression of mediastinal left hilar adenopathy was pulmonary metastatic disease. Stress test on 02/15/2016 ruled out cardiac ischemia.  He was treated with Augmentin for presumed lower extremity cellulitis.  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  There is possible increase in change in the lytic lesion with some movement at the fracture site.  He has broken the distal locking screw.  He ambulates with a rolling walker.  Bone scan at [facility] on 06/22/2016 revealed no definite evidence of osseous metastatic disease.  There were photopenic regions overlying the left posterior eighth rib and the proximal left femur, likely  representing prior surgical resection  He underwent removal of prior rod/intramedullary nail, radical resection of left femur, resection of tumor, and left hemiarthroplasty at [facility] on 06/16/2016.  Pathology of the left proximal femur revealed metastatic carcinoma compatible with renal cell carcinoma.  He is scheduled to begin physical therapy.   He received 13 cycles of nivolumab (01/28/2016 - 06/06/2016; 08/08/2016 - 10/03/2016).  He tolerated his infusions well.  TSH was 6.239 (0.35-4.50) with a normal free T4 (0.79) on 02/25/2016.  TSH was 2.713 (normal) and free T4 0.79 (normal) on 05/19/2016.  Treatment was held on 05/19/2016 secondary to diarrhea and on 05/26/2016 secondary to unexplained hypotension.   Cortisol level was 7.9 with a repeat of 10.3 (normal) on 05/29/2016 at 8:26 AM.  ACTH was 39.1 (normal) at 10 AM on 05/26/2016.  Chest, abdomen, and pelvic CT on 10/20/2016 revealed progressive disease with enlarging left renal mass and pulmonary nodules.  Stable lytic lesion in posterior elements of T8.  Bone scan on 10/23/2016 revealed activity identified in the region of prior thoracic spine surgery. Local progression of concern.  There was uptake in the posterior left ninth rib is new since 12/23/2015 corresponds to lytic lesion on recent CT. This is compatible with metastatic disease.  There was uptake in the distal left femur concerning for  metastatic involvement.  There was anterior left seventh rib activity also concerning for metastatic deposit.  MRI of the cervical spine on 10/27/2016 revealed C4 body fracture with up to 75% height loss, likely pathologic.  There was no visible epidural or foraminal infiltration, but post-contrast imaging would be more sensitive.  On the symptomatic left side there is C4-5 and C5-6 left foraminal narrowing.  On the right, there is degenerative C3-4 and C6-7 foraminal impingement.  There was diffusely patent spinal canal.  He underwent  C4 corpectomy with  C3-5 plating, T6-T10 PSF with tumor decompression, and T8 laminectomy on 10/30/2016.  Pathology of the T8 lesion and C4 lesion confirmed renal cell carcinoma.  He received radiation from 12/05/2016 - 12/21/2016.   He began cabozantinib (Cometriq).  He received radiation from 12/05/2016 - 12/21/2016. Treatment was held during radiation.     He developed progressive microcytic anemia c/w iron deficiency and anemia of chronic disease.  After his surgery at [facility] in 05/2016, he required 2 units of PRBCs.  MCV was 74.  Anemia work-up on 07/31/2016 revealed a ferritin 318 (falsely elevated secondary to ESR > 140), iron saturation 12%, TIBC 217 (low), and retic 2.5%.  He has received Venofer x 3 (08/22/2016, 09/05/2016, 09/19/2016, 11/10/2016, and 11/17/2016) with improvement in his hematocrit.  He has restrictive lung disease secondary to his weight and prior radiation.  He has been prescribed oxygen (not received).    His psoriasis flared on nivolumab, but has improved with topical steroids.  Psoriasis was back to baseline after 2 months off nivolumab.  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  Symptomatically, he has had some swallowing issues post radiation.  Symptoms appear to be improving.  Pain is controlled.  He is struggling with financial issues.  Hematocrit is 33.9 with an MCV of 75.8.  Ferritin is 254.  Plan: 1.  Labs today:  CBC with diff, CMP, Mg, ferritin, iron studies. 2.  Begin cabozantinib. 3.  Discuss Venofer today and in 1 week based on ferritin (not back during clinic).   4.  Rx:  Ativan prn q 8 hours prn nausea (dis: #30). 5.  Continue Xarelto. 6.  Discuss Xgeva and thoughts about initiation of therapy.  Patient wishes to postpone. 7.  Patient to follow-up with Elease Etienne, social work, today. 8.  RTC in 2 weeks for MD assessment, labs (CBC with diff, CMP), and +/-  Venofer.  Addendum:  Hemoglobin improved from 10.7 to 13.5 in past 2 weeks.  Ferritin adequate.   No Venofer needed.   [name], MD 12/28/2016, 9:38 AM

## 2017-01-04 ENCOUNTER — Ambulatory Visit: Payer: Medicaid Other

## 2017-01-10 ENCOUNTER — Other Ambulatory Visit: Payer: Self-pay | Admitting: *Deleted

## 2017-01-10 MED ORDER — CABOZANTINIB S-MALATE 3 X 20 MG PO KIT: 60.0000 mg | PACK | Freq: Every day | ORAL | 1 refills | Status: DC

## 2017-01-11 ENCOUNTER — Other Ambulatory Visit: Payer: Self-pay | Admitting: *Deleted

## 2017-01-11 ENCOUNTER — Telehealth: Payer: Self-pay | Admitting: *Deleted

## 2017-01-11 ENCOUNTER — Other Ambulatory Visit: Payer: Self-pay | Admitting: Hematology and Oncology

## 2017-01-11 DIAGNOSIS — C649 Malignant neoplasm of unspecified kidney, except renal pelvis: Secondary | ICD-10-CM

## 2017-01-11 MED ORDER — CABOMETYX 60 MG PO TABS: 60.0000 mg | ORAL_TABLET | Freq: Every day | ORAL | 2 refills | Status: AC

## 2017-01-11 NOTE — Telephone Encounter (Signed)
It is the same thing, who do I need to call?

## 2017-01-11 NOTE — Telephone Encounter (Signed)
[employer] [phone]

## 2017-01-11 NOTE — Telephone Encounter (Signed)
Patient has been on Cabometyx since May, and we sent Rx yesterday for Cometriq  He is approved for Cometryx. Please clarify.

## 2017-01-12 ENCOUNTER — Inpatient Hospital Stay: Payer: Medicaid Other | Admitting: Hematology and Oncology

## 2017-01-12 ENCOUNTER — Inpatient Hospital Stay: Payer: Medicaid Other

## 2017-01-12 ENCOUNTER — Other Ambulatory Visit: Payer: Self-pay | Admitting: *Deleted

## 2017-01-12 NOTE — Progress Notes (Deleted)
[facility] day:  01/12/2017   Chief Complaint: Marc Blake is a 61 y.o. male with metastatic renal cell carcinoma who is seen for reassessment after radiation.  HPI: The patient was last seen in the medical oncology clinic on 12/28/2016.  At that time, he had completed radiation on 12/28/2016.  He restarted cabazatinib on 07/xx/2018.  Labs from last visit included a hematocrit of 42.0, hemoglobin 13.5, MCV 77.6, platelets 233,000, white count 6500 with an [place] of 4500. Comprehensive metabolic panel included a creatinine of 1.48, calcium 10.5, and albumin 4.1.  Ferritin was 254.  Iron studies included a saturation of 18% a TIBC of 304.  During the interim,   Past Medical History:  Diagnosis Date  . Acid reflux   . Anxiety   . Arthritis   . CHF (congestive heart failure) ([place])   . Depression   . Depression   . Hyperlipidemia   . Hypertension   . Myocardial infarction ([place])   . Renal cancer ([place])   . Renal cell carcinoma ([place])   . Renal insufficiency   . Skin cancer     Past Surgical History:  Procedure Laterality Date  . BACK SURGERY    . CARDIAC CATHETERIZATION     with stent  . CYSTOSCOPY WITH BIOPSY  03/06/2016   Procedure: CYSTOSCOPY WITH BIOPSY;  Surgeon: [name], MD;  Location: [facility] ORS;  Service: Urology;;  . [name] WITH URETHRAL DILATATION N/A 03/06/2016   Procedure: CYSTOSCOPY WITH URETHRAL DILATATION WITH CATHETER PLACEMENT;  Surgeon: [name], MD;  Location: [facility] ORS;  Service: Urology;  Laterality: N/A;  . KNEE SURGERY Left   . LEG SURGERY Left   . TONSILLECTOMY      Family History  Problem Relation Age of Onset  . Hypertension Father   . Heart attack Father   . Stroke Sister   . Hypertension Brother   . Stroke Maternal Uncle     Social History:  reports that he has never smoked. He has never used smokeless tobacco. He reports that he does not drink alcohol or use drugs.  He is originally  from [place].  He then lived in [place] for 17 years.  He moved to [place] with his wife.  He lost his job of 35 years in early 09/2016.   Allergies: No Known Allergies  Current Medications: Current Outpatient Prescriptions  Medication Sig Dispense Refill  . amLODipine (NORVASC) 10 MG tablet Take 10 mg by mouth daily.  3  . atorvastatin (LIPITOR) 20 MG tablet TAKE 1 TABLET ONCE A DAY (AT BEDTIME) FOR 30 DAYS  2  . busPIRone (BUSPAR) 7.5 MG tablet     . CABOMETYX 60 MG TABS Take 60 mg by mouth daily. 30 tablet 2  . cabozantinib s-malate (COMETRIQ) capsule (3 x 17m kit) Take 60 mg by mouth daily. 1 kit 1  . carvedilol (COREG) 12.5 MG tablet TAKE 1 TABLET BY MOUTH TWICE A DAY FOR 30 DAYS  3  . [employer] GENTLE LAXATIVE 10 MG suppository INSERT 1 SUPPOSITORY RECTALLY EVERY 24HRS AS NEEDED FOR CONSTIPATION  0  . [employer] STOOL SOFTENER 8.6-50 MG tablet TAKE 1 TABLET BY MOUTH TWICE A DAY AS NEEDED CONSTIPATION  0  . fluocinonide cream (LIDEX) 0.05 %     . gabapentin (NEURONTIN) 300 MG capsule Take 300 mg by mouth 3 (three) times daily.     .Marland Kitchenibuprofen (ADVIL,MOTRIN) 200 MG tablet Take 200 mg by mouth every 8 (  eight) hours as needed for moderate pain. 4 every 8 hours    . ipratropium-albuterol (DUONEB) 0.5-2.5 (3) MG/3ML SOLN     . lisinopril (PRINIVIL,ZESTRIL) 10 MG tablet     . LORazepam (ATIVAN) 0.5 MG tablet Take 1 tablet (0.5 mg total) by mouth every 8 (eight) hours. 30 tablet 0  . morphine (MS CONTIN) 30 MG 12 hr tablet Take 1 tablet (30 mg total) by mouth every 8 (eight) hours. 90 tablet 0  . oxyCODONE (OXY IR/ROXICODONE) 5 MG immediate release tablet Take 10 mg by mouth every 6 (six) hours as needed (every 6 h prn).     . potassium chloride SA (K-DUR,KLOR-CON) 20 MEQ tablet Take 1 tablet (20 mEq total) by mouth daily. For 3 days 30 tablet 0  . senna (SENOKOT) 8.6 MG tablet Take 2 tablets by mouth at bedtime.     . sertraline (ZOLOFT) 100 MG tablet Take 100 mg by mouth at bedtime.  1  .  tenofovir (VIREAD) 300 MG tablet Take 1 tablet (300 mg total) by mouth daily. (Patient not taking: Reported on 11/10/2016) 30 tablet 11  . traZODone (DESYREL) 50 MG tablet Take 50 mg by mouth at bedtime.   3  . triamcinolone cream (KENALOG) 0.5 % Apply topically 2 (two) times daily. 30 g 1  . XARELTO 20 MG TABS tablet Take 20 mg by mouth daily.  1   Current Facility-Administered Medications  Medication Dose Route Frequency Provider Last Rate Last Dose  . 0.9 %  sodium chloride infusion   Intravenous Once [name], MD        Review of Systems:  GENERAL:  Frustrated.  No fevers or sweats.  Weight down 11 pounds in 1 month. PERFORMANCE STATUS (ECOG):  2-3 HEENT:  No visual changes, runny nose, sore throat, mouth sores or tenderness.  Waiting on decision to fix teeth. Lungs:  Shortness of breath on exertion.  No cough or wheezing.  No hemoptysis. Cardiac:  No chest pain, palpitations, orthopnea, or PND. GI:  Chronic constipation.  No nausea, vomiting, diarrhea, melena or hematochezia. GU:  No urgency, frequency, dysuria, or hematuria. Self caths 3-4 x/week. Musculoskeletal:  Back and shoulder pain.  No muscle tenderness. Extremities:  Chronic left lower extremity swelling. Skin:  Eczema on elbows and back. Neuro:  Poor memory.  No focal numbness.  Decreased movement in left arm (slightly improved).  No headache.  Decreased feeling left leg (chronic). Endocrine:  No diabetes, thyroid issues, hot flashes or night sweats. Psych:  Struggling with not working.  Anxiety. Pain:Back pain 8 out of 10. Review of systems:  All other systems reviewed and found to be negative.  Physical Exam: There were no vitals taken for this visit. GENERAL:  Fatigued appearing gentleman sitting comfortably in a chair in the exam room in no acute distress.  MENTAL STATUS:  Alert and oriented to person, place and time. HEAD:  Long gray hair with goatee.  Normocephalic, atraumatic, face symmetric, no  Cushingoid features. EYES:  Pupils equal round and reactive to light and accomodation.  No conjunctivitis or scleral icterus. ENT:  Oropharynx clear without lesion.  Poor dentition.  Tongue normal. Mucous membranes moist.  NECK:  Brace in place. RESPIRATORY:  Clear to auscultation without rales, wheezes or rhonchi. CARDIOVASCULAR:  Regular rate and rhythm without murmur, rub or gallop.  ABDOMEN:  Soft, non-tender, with active bowel sounds, and no hepatosplenomegaly.  No masses. SKIN:  Tattoos. Eczema on dorsal surface of forearms and lower  back, minimal. EXTREMITIES:  No skin discoloration or tenderness.  No palpable cords. LYMPH NODES: No palpable cervical, supraclavicular, axillary or inguinal adenopathy  NEUROLOGICAL:  Decreased ROM left arm, improved.  Grips strength symmetric. PSYCH:  Appropriate.  . Imaging studies: 12/28/2013:  Abdominal and pelvic CT scan revealed an 8.5 x 10.2 x 9.2 cm irregularly enhancing mass of the left kidney with some internal calcifications. There were enlarged left sided renal hilar lymph nodes. There were multiple pulmonary nodules in both lower lobes (largest 1.1 cm). There was a destructive bone lesion with an irregular 8.6 x 5.1 cm soft tissue mass invasive of the spinal canal narrowing at least 50% and disturbing the pedicle and portions of the left transverse process rib and T8 vertebral body.  01/15/2014:  Chest CT revealed innumerable pulmonary nodules, a large metastatic lesion at T8 and left eighth rib with invasion into the spinal canal and moderate canal stenosis. There was a left upper pole right kidney mass.  Head MRI was negative. 05/31/2015:  Chest, abdomen, and pelvic CT revealed slight enlargement of multiple pulmonary nodules.  The left renal mass was similar (12.2 x 9.6 cm).  The right kidney lesion was stable (lower pole lesion 2 x 1.7 cm).  Bone scan on 05/31/2015 revealed slight increased activity in the left costal vertebral junction at  T7. 12/14/2015:  Chest, abdomen, and pelvic CT revealed an irregular 10.7 cm heterogeneous renal cortical mass in the upper left kidney and a heterogeneous 2.3 cm renal cortical mass in the lateral lower right kidney.  There was mild bilateral hydroureteronephrosis with bladder distention.  There was mild left para-aortic lymphadenopathy.  There were numerous (greater than 20) pulmonary metastases.  Largest nodule was 2.1 cm in the RLL. There was a partially visualized mildly expansile lytic lesion in the left proximal femur status post surgical transfixation.  Thre was a small probably loculated pleural effusion at the left eighth rib resection site.  There were no other bone abnormalities. 12/24/2015:  Bone scan revealed photopenia from prior removal of the left eighth rib. There was increased  uptake in the medial thoracic spine from T7 and T9 is felt to be due to postoperative fusion.  There was increased uptake throughout much of the left femur as well as increased uptake in the soft tissues of much of the left femur which may be due to previous surgery and radiation therapy change. 03/10/2016:  Chest CT revealed unchanged bilateral pulmonary nodules and a left renal mass. 08/08/2016:  Chest, abdomen, and pelvic CT revealed decreasing left kidney lesion, stable to increase in multifocal pulmonary nodules and a similar appearance of prominent upper abdominal lymph nodes that were worrisome for metastatic adenopathy. 10/20/2016:  Chest, abdomen, and pelvic CT revealed progressive disease with enlarging left renal mass and pulmonary nodules.  Stable lytic lesion in posterior elements of T8.   10/23/2016:  Bone scan revealed activity identified in the region of prior thoracic spine surgery. Local progression of concern.  There was uptake in the posterior left ninth rib was new since 12/23/2015 corresponds to lytic lesion on recent CT. This is compatible with metastatic disease.  There was uptake in the distal  left femur concerning for metastatic involvement.  There was anterior left seventh rib activity also concerning for metastatic deposit. 10/27/2016:  MRI of the cervical spine revealed C4 body fracture with up to 75% height loss, likely pathologic.  There was no visible epidural or foraminal infiltration, but post-contrast imaging would be more sensitive.  On the symptomatic left side there is C4-5 and C5-6 left foraminal narrowing.  On the right, there is degenerative C3-4 and C6-7 foraminal impingement.  There was diffusely patent spinal canal. 10/28/2016: MRI at [facility] revealed C4 pathologic fracture with minimal retropulsion and moderate canal stenosis. New enhancing soft tissue lesion in posterior elements of T8 causing anterior displacement with severe stenosis without cord signal abnormality.   No visits with results within 3 Day(s) from this visit.  Latest known visit with results is:  Appointment on 12/28/2016  Component Date Value Ref Range Status  . WBC 12/28/2016 6.5  3.8 - 10.6 K/uL Final  . RBC 12/28/2016 5.41  4.40 - 5.90 MIL/uL Final  . Hemoglobin 12/28/2016 13.5  13.0 - 18.0 g/dL Final  . HCT 12/28/2016 42.0  40.0 - 52.0 % Final  . MCV 12/28/2016 77.6* 80.0 - 100.0 fL Final  . MCH 12/28/2016 24.9* 26.0 - 34.0 pg Final  . MCHC 12/28/2016 32.2  32.0 - 36.0 g/dL Final  . RDW 12/28/2016 23.5* 11.5 - 14.5 % Final  . Platelets 12/28/2016 233  150 - 440 K/uL Final  . Neutrophils Relative % 12/28/2016 69  % Final  . Neutro Abs 12/28/2016 4.5  1.4 - 6.5 K/uL Final  . Lymphocytes Relative 12/28/2016 9  % Final  . Lymphs Abs 12/28/2016 0.6* 1.0 - 3.6 K/uL Final  . Monocytes Relative 12/28/2016 17  % Final  . Monocytes Absolute 12/28/2016 1.1* 0.2 - 1.0 K/uL Final  . Eosinophils Relative 12/28/2016 4  % Final  . Eosinophils Absolute 12/28/2016 0.3  0 - 0.7 K/uL Final  . Basophils Relative 12/28/2016 1  % Final  . Basophils Absolute 12/28/2016 0.1  0 - 0.1 K/uL Final  . Sodium 12/28/2016  141  135 - 145 mmol/L Final  . Potassium 12/28/2016 3.7  3.5 - 5.1 mmol/L Final  . Chloride 12/28/2016 99* 101 - 111 mmol/L Final  . CO2 12/28/2016 29  22 - 32 mmol/L Final  . Glucose, Bld 12/28/2016 101* 65 - 99 mg/dL Final  . BUN 12/28/2016 27* 6 - 20 mg/dL Final  . Creatinine, Ser 12/28/2016 1.48* 0.61 - 1.24 mg/dL Final  . Calcium 12/28/2016 10.5* 8.9 - 10.3 mg/dL Final  . Total Protein 12/28/2016 8.8* 6.5 - 8.1 g/dL Final  . Albumin 12/28/2016 4.1  3.5 - 5.0 g/dL Final  . AST 12/28/2016 26  15 - 41 U/L Final  . ALT 12/28/2016 16* 17 - 63 U/L Final  . Alkaline Phosphatase 12/28/2016 99  38 - 126 U/L Final  . Total Bilirubin 12/28/2016 0.4  0.3 - 1.2 mg/dL Final  . GFR calc non Af Amer 12/28/2016 50* >60 mL/min Final  . GFR calc Af Amer 12/28/2016 58* >60 mL/min Final   Comment: (NOTE) The eGFR has been calculated using the CKD EPI equation. This calculation has not been validated in all clinical situations. eGFR's persistently <60 mL/min signify possible Chronic Kidney Disease.   . Anion gap 12/28/2016 13  5 - 15 Final  . Magnesium 12/28/2016 2.2  1.7 - 2.4 mg/dL Final  . Ferritin 12/28/2016 254  24 - 336 ng/mL Final  . Iron 12/28/2016 56  45 - 182 ug/dL Final  . TIBC 12/28/2016 304  250 - 450 ug/dL Final  . Saturation Ratios 12/28/2016 18  17.9 - 39.5 % Final  . UIBC 12/28/2016 248  ug/dL Final    Assessment:  LAVONE WEISEL is a 61 y.o. male with metastatic renal cell carcinoma  presenting in 12/2013.  He had a large left renal mass, rib and vertebral body involvement, and multiple pulmonary nodules.  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    He was on Votrient (pazopanib) from 02/13/2014 - 01/21/2016.  He began nivolumab on 01/28/2016.  He received 3000 cGy to T7 - T9 and associated ribs beginning 05/11/2014.  On 06/15/2014, he received 1 dose of nivolumab.  He presented with progressive paralysis from the waist down on  07/06/2014.  He underwent left posterolateral thoracotomy with left chest wall resection with T8 corpectomy and interbody fusion with donor bone on 07/06/2014.  Pathology revealed metastatic renal cell carcinoma. Post-operatively, he developed sepsis secondary to a submandibular abscess dental abscess.  He underwent tracheostomy, I&D of a right neck abscess, tooth extraction, and PEG tube placement.  Zometa has subsequently been on hold.  He developed a pathologic fracture of the left femur.  He is s/p intramedullary nailing on 01/27/2015.  He received radiation to the left leg.  He was admitted on 04/26/2015 with aspiration pneumonia and opioid overdose. He is currently taking MSContin 15 mg BID and oxycodone 5 mg q day.  He developed a left lower extremity DVT (popliteal and soleal vein) on 06/07/2015.  Left lower extremity ultrasound on 02/07/2016 revealed no evidence of DVT.  He is on long term Xarelto.  Patient has a long standing history of an obstructive uropathy and elevated PVR.  He has undergone urethral dilatation on several occasions.  He underwent cystoscopy, balloon urethral dilatation, and bladder biopsy with Foley catheter placement on 03/06/2016.  Bladder biopsy revealed cystitis with reactive changes, negative for atypia and malignancy.  He was admitted to [facility] from 02/14/2016 - 02/16/2016 with chest pain.  Chest CT angiogram on 02/15/2016 ruled out pulmonary embolism. Imaging revealed slight progression of mediastinal left hilar adenopathy was pulmonary metastatic disease. Stress test on 02/15/2016 ruled out cardiac ischemia.  He was treated with Augmentin for presumed lower extremity cellulitis.  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  There is possible increase in change in the lytic lesion with some movement at the fracture site.  He has broken the distal locking screw.  He ambulates with a rolling walker.  Bone scan at [facility] on 06/22/2016 revealed no definite  evidence of osseous metastatic disease.  There were photopenic regions overlying the left posterior eighth rib and the proximal left femur, likely representing prior surgical resection  He underwent removal of prior rod/intramedullary nail, radical resection of left femur, resection of tumor, and left hemiarthroplasty at [facility] on 06/16/2016.  Pathology of the left proximal femur revealed metastatic carcinoma compatible with renal cell carcinoma.  He is scheduled to begin physical therapy.   He received 13 cycles of nivolumab (01/28/2016 - 06/06/2016; 08/08/2016 - 10/03/2016).  He tolerated his infusions well.  TSH was 6.239 (0.35-4.50) with a normal free T4 (0.79) on 02/25/2016.  TSH was 2.713 (normal) and free T4 0.79 (normal) on 05/19/2016.  Treatment was held on 05/19/2016 secondary to diarrhea and on 05/26/2016 secondary to unexplained hypotension.   Cortisol level was 7.9 with a repeat of 10.3 (normal) on 05/29/2016 at 8:26 AM.  ACTH was 39.1 (normal) at 10 AM on 05/26/2016.  Chest, abdomen, and pelvic CT on 10/20/2016 revealed progressive disease with enlarging left renal mass and pulmonary nodules.  Stable lytic lesion in posterior elements of T8.  Bone scan on 10/23/2016 revealed activity identified in the region  of prior thoracic spine surgery. Local progression of concern.  There was uptake in the posterior left ninth rib is new since 12/23/2015 corresponds to lytic lesion on recent CT. This is compatible with metastatic disease.  There was uptake in the distal left femur concerning for metastatic involvement.  There was anterior left seventh rib activity also concerning for metastatic deposit.  MRI of the cervical spine on 10/27/2016 revealed C4 body fracture with up to 75% height loss, likely pathologic.  There was no visible epidural or foraminal infiltration, but post-contrast imaging would be more sensitive.  On the symptomatic left side there is C4-5 and C5-6 left foraminal narrowing.  On the  right, there is degenerative C3-4 and C6-7 foraminal impingement.  There was diffusely patent spinal canal.  He underwent  C4 corpectomy with C3-5 plating, T6-T10 PSF with tumor decompression, and T8 laminectomy on 10/30/2016.  Pathology of the T8 lesion and C4 lesion confirmed renal cell carcinoma.  He received radiation from 12/05/2016 - 12/21/2016.   He began cabozantinib (Cometriq) on 06/x/2018.  Treatment was held during radiation.  He restarted cabozantinib on 07/xx/2018.  He developed progressive microcytic anemia c/w iron deficiency and anemia of chronic disease.  After his surgery at [facility] in 05/2016, he required 2 units of PRBCs.  MCV was 74.  Anemia work-up on 07/31/2016 revealed a ferritin 318 (falsely elevated secondary to ESR > 140), iron saturation 12%, TIBC 217 (low), and retic 2.5%.  He has received Venofer x 3 (08/22/2016, 09/05/2016, 09/19/2016, 11/10/2016, and 11/17/2016) with improvement in his hematocrit.  He has restrictive lung disease secondary to his weight and prior radiation.  He has been prescribed oxygen (not received).    His psoriasis flared on nivolumab, but has improved with topical steroids.  Psoriasis was back to baseline after 2 months off nivolumab.  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  Symptomatically, he is feeling slightly better.  He is frustrated by his neck collar and decreased mobility.  He has lost weight.  Hematocrit has improved from 26.6 to 28.6 in the past week.  Plan: 1.  Labs today:  CBC with diff, CMP. 2.     [name], MD 01/12/2017, 4:48 AM

## 2017-01-12 NOTE — Telephone Encounter (Signed)
Patient called and reported that he was having diarrhea and did not want to come to appt today.  Returned patients call and he reported diarrhea x 2 for 3 days, denies other symptoms or fever, patient reports that he restarted his cabometyx 2 weeks ago per orders.  Patient encouraged to drink plenty of fluids and some Gatorade and to take immodium as directed on box. Instructed to call for increased diarrhea or unresolved diarrhea or other symptoms, appt to be rescheduled to next week and scheduler to call patient. Patient voiced understanding.

## 2017-01-16 ENCOUNTER — Telehealth: Payer: Self-pay | Admitting: *Deleted

## 2017-01-16 NOTE — Telephone Encounter (Signed)
Opened in error

## 2017-01-17 ENCOUNTER — Other Ambulatory Visit: Payer: Self-pay | Admitting: Hematology and Oncology

## 2017-01-19 ENCOUNTER — Inpatient Hospital Stay: Payer: Medicaid Other

## 2017-01-19 ENCOUNTER — Inpatient Hospital Stay (HOSPITAL_BASED_OUTPATIENT_CLINIC_OR_DEPARTMENT_OTHER): Payer: Medicaid Other | Admitting: Oncology

## 2017-01-19 ENCOUNTER — Encounter: Payer: Self-pay | Admitting: Nurse Practitioner

## 2017-01-19 ENCOUNTER — Encounter: Payer: Self-pay | Admitting: Hematology and Oncology

## 2017-01-19 ENCOUNTER — Inpatient Hospital Stay: Payer: Medicaid Other | Attending: Hematology and Oncology

## 2017-01-19 VITALS — BP 151/96 | HR 106 | Temp 96.4°F | Resp 20 | Wt 210.4 lb

## 2017-01-19 DIAGNOSIS — I252 Old myocardial infarction: Secondary | ICD-10-CM | POA: Diagnosis not present

## 2017-01-19 DIAGNOSIS — C7951 Secondary malignant neoplasm of bone: Secondary | ICD-10-CM | POA: Diagnosis not present

## 2017-01-19 DIAGNOSIS — E785 Hyperlipidemia, unspecified: Secondary | ICD-10-CM | POA: Diagnosis not present

## 2017-01-19 DIAGNOSIS — I509 Heart failure, unspecified: Secondary | ICD-10-CM

## 2017-01-19 DIAGNOSIS — K219 Gastro-esophageal reflux disease without esophagitis: Secondary | ICD-10-CM | POA: Insufficient documentation

## 2017-01-19 DIAGNOSIS — R918 Other nonspecific abnormal finding of lung field: Secondary | ICD-10-CM | POA: Diagnosis not present

## 2017-01-19 DIAGNOSIS — D649 Anemia, unspecified: Secondary | ICD-10-CM

## 2017-01-19 DIAGNOSIS — F419 Anxiety disorder, unspecified: Secondary | ICD-10-CM

## 2017-01-19 DIAGNOSIS — Z86718 Personal history of other venous thrombosis and embolism: Secondary | ICD-10-CM | POA: Diagnosis not present

## 2017-01-19 DIAGNOSIS — G893 Neoplasm related pain (acute) (chronic): Secondary | ICD-10-CM

## 2017-01-19 DIAGNOSIS — D509 Iron deficiency anemia, unspecified: Secondary | ICD-10-CM | POA: Diagnosis not present

## 2017-01-19 DIAGNOSIS — M129 Arthropathy, unspecified: Secondary | ICD-10-CM

## 2017-01-19 DIAGNOSIS — Z7901 Long term (current) use of anticoagulants: Secondary | ICD-10-CM | POA: Insufficient documentation

## 2017-01-19 DIAGNOSIS — Z8781 Personal history of (healed) traumatic fracture: Secondary | ICD-10-CM | POA: Insufficient documentation

## 2017-01-19 DIAGNOSIS — Z923 Personal history of irradiation: Secondary | ICD-10-CM | POA: Diagnosis not present

## 2017-01-19 DIAGNOSIS — F329 Major depressive disorder, single episode, unspecified: Secondary | ICD-10-CM

## 2017-01-19 DIAGNOSIS — Z85828 Personal history of other malignant neoplasm of skin: Secondary | ICD-10-CM

## 2017-01-19 DIAGNOSIS — I82532 Chronic embolism and thrombosis of left popliteal vein: Secondary | ICD-10-CM

## 2017-01-19 DIAGNOSIS — I10 Essential (primary) hypertension: Secondary | ICD-10-CM | POA: Insufficient documentation

## 2017-01-19 DIAGNOSIS — C649 Malignant neoplasm of unspecified kidney, except renal pelvis: Secondary | ICD-10-CM

## 2017-01-19 DIAGNOSIS — R5383 Other fatigue: Secondary | ICD-10-CM | POA: Insufficient documentation

## 2017-01-19 DIAGNOSIS — Z7189 Other specified counseling: Secondary | ICD-10-CM

## 2017-01-19 DIAGNOSIS — C7800 Secondary malignant neoplasm of unspecified lung: Secondary | ICD-10-CM

## 2017-01-19 DIAGNOSIS — Z79899 Other long term (current) drug therapy: Secondary | ICD-10-CM

## 2017-01-19 LAB — COMPREHENSIVE METABOLIC PANEL
ALT: 56 U/L (ref 17–63)
AST: 61 U/L — ABNORMAL HIGH (ref 15–41)
Albumin: 3.8 g/dL (ref 3.5–5.0)
Alkaline Phosphatase: 136 U/L — ABNORMAL HIGH (ref 38–126)
Anion gap: 11 (ref 5–15)
BUN: 18 mg/dL (ref 6–20)
CO2: 30 mmol/L (ref 22–32)
Calcium: 9.3 mg/dL (ref 8.9–10.3)
Chloride: 100 mmol/L — ABNORMAL LOW (ref 101–111)
Creatinine, Ser: 0.94 mg/dL (ref 0.61–1.24)
GFR calc Af Amer: >60 mL/min (ref >60)
GFR calc non Af Amer: >60 mL/min (ref >60)
Glucose, Bld: 108 mg/dL — ABNORMAL HIGH (ref 65–99)
Potassium: 3.9 mmol/L (ref 3.5–5.1)
Sodium: 141 mmol/L (ref 135–145)
Total Bilirubin: 0.7 mg/dL (ref 0.3–1.2)
Total Protein: 8.4 g/dL — ABNORMAL HIGH (ref 6.5–8.1)

## 2017-01-19 LAB — CBC WITH DIFFERENTIAL/PLATELET
Basophils Absolute: 0 10*3/uL (ref 0–0.1)
Basophils Relative: 1 %
Eosinophils Absolute: 0.3 10*3/uL (ref 0–0.7)
Eosinophils Relative: 8 %
HCT: 46.7 % (ref 40.0–52.0)
Hemoglobin: 15.1 g/dL (ref 13.0–18.0)
Lymphocytes Relative: 13 %
Lymphs Abs: 0.5 10*3/uL — ABNORMAL LOW (ref 1.0–3.6)
MCH: 25.5 pg — ABNORMAL LOW (ref 26.0–34.0)
MCHC: 32.3 g/dL (ref 32.0–36.0)
MCV: 78.9 fL — ABNORMAL LOW (ref 80.0–100.0)
Monocytes Absolute: 0.6 10*3/uL (ref 0.2–1.0)
Monocytes Relative: 14 %
Neutro Abs: 2.6 10*3/uL (ref 1.4–6.5)
Neutrophils Relative %: 64 %
Platelets: 123 10*3/uL — ABNORMAL LOW (ref 150–440)
RBC: 5.91 MIL/uL — ABNORMAL HIGH (ref 4.40–5.90)
RDW: 23.2 % — ABNORMAL HIGH (ref 11.5–14.5)
WBC: 4 10*3/uL (ref 3.8–10.6)

## 2017-01-19 NOTE — Progress Notes (Signed)
Hematology/Oncology Consult note [facility]  Telephone:([phone] [phone]  Patient Care Team: [name], MD as PCP - General (Family Medicine) Marry Guan, Laurice Record, MD (Orthopedic Surgery) Corey Skains, MD as Consulting Physician (Cardiology) Erby Pian, MD as Referring Physician (Specialist) Emmaline Kluver., MD (Rheumatology)   Name of the patient: Marc Blake  [id number]  1956/01/14   Date of visit: 01/19/17  Diagnosis- metastatic renal cell carcinoma  Chief complaint/ Reason for visit- assess tolerance to cabozantinib  Heme/Onc history: Marc Blake is a 61 y.o. male with metastatic renal cell carcinoma presenting in 12/2013.  He had a large left renal mass, rib and vertebral body involvement, and multiple pulmonary nodules.  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    He was on Votrient (pazopanib) from 02/13/2014 - 01/21/2016.  He began nivolumab on 01/28/2016.  He received 3000 cGy to T7 - T9 and associated ribs beginning 05/11/2014.  On 06/15/2014, he received 1 dose of nivolumab.  He presented with progressive paralysis from the waist down on 07/06/2014.  He underwent left posterolateral thoracotomy with left chest wall resection with T8 corpectomy and interbody fusion with donor bone on 07/06/2014.  Pathology revealed metastatic renal cell carcinoma. Post-operatively, he developed sepsis secondary to a submandibular abscess dental abscess.  He underwent tracheostomy, I&D of a right neck abscess, tooth extraction, and PEG tube placement.  Zometa has subsequently been on hold.  He developed a pathologic fracture of the left femur.  He is s/p intramedullary nailing on 01/27/2015.  He received radiation to the left leg.  He was admitted on 04/26/2015 with aspiration pneumonia and opioid overdose. He is currently taking MSContin 15 mg BID and oxycodone 5 mg  q day.  He developed a left lower extremity DVT (popliteal and soleal vein) on 06/07/2015.  Left lower extremity ultrasound on 02/07/2016 revealed no evidence of DVT.  He is on long term Xarelto.  Patient has a long standing history of an obstructive uropathy and elevated PVR.  He has undergone urethral dilatation on several occasions.  He underwent cystoscopy, balloon urethral dilatation, and bladder biopsy with Foley catheter placement on 03/06/2016.  Bladder biopsy revealed cystitis with reactive changes, negative for atypia and malignancy.  He was admitted to [facility] from 02/14/2016 - 02/16/2016 with chest pain.  Chest CT angiogram on 02/15/2016 ruled out pulmonary embolism. Imaging revealed slight progression of mediastinal left hilar adenopathy was pulmonary metastatic disease. Stress test on 02/15/2016 ruled out cardiac ischemia.  He was treated with Augmentin for presumed lower extremity cellulitis.  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  There is possible increase in change in the lytic lesion with some movement at the fracture site.  He has broken the distal locking screw.  He ambulates with a rolling walker.  Bone scan at [facility] on 06/22/2016 revealed no definite evidence of osseous metastatic disease.  There were photopenic regions overlying the left posterior eighth rib and the proximal left femur, likely representing prior surgical resection  He underwent removal of prior rod/intramedullary nail, radical resection of left femur, resection of tumor, and left hemiarthroplasty at [facility] on 06/16/2016.  Pathology of the left proximal femur revealed metastatic carcinoma compatible with renal cell carcinoma.  He is scheduled to begin physical therapy.   He received 13 cycles of nivolumab (01/28/2016 - 06/06/2016; 08/08/2016 - 10/03/2016).  He tolerated his infusions well.  TSH  was 6.239 (0.35-4.50) with a normal free T4 (0.79) on 02/25/2016.  TSH was 2.713 (normal) and free T4  0.79 (normal) on 05/19/2016. Treatment was heldon 05/19/2016 secondary to diarrhea and on 05/26/2016 secondary to unexplained hypotension.   Cortisol level was 7.9 with a repeat of 10.3 (normal) on 05/29/2016 at 8:26 AM.  ACTH was 39.1 (normal) at 10 AM on 05/26/2016.  Chest, abdomen, and pelvic CT on 10/20/2016 revealed progressive disease with enlarging left renal mass and pulmonary nodules.  Stable lytic lesion in posterior elements of T8.  Bone scan on 10/23/2016 revealed activity identified in the region of prior thoracic spine surgery. Local progression of concern.  There was uptake in the posterior left ninth rib is new since 12/23/2015 corresponds to lytic lesion on recent CT. This is compatible with metastatic disease.  There was uptake in the distal left femur concerning for metastatic involvement.  There was anterior left seventh rib activity also concerning for metastatic deposit.  MRI of the cervical spineon 10/27/2016 revealed C4 body fracture with up to 75% height loss, likely pathologic. There was no visible epidural or foraminal infiltration, but post-contrast imaging would be more sensitive. On the symptomatic left side there is C4-5 and C5-6 left foraminal narrowing. On the right, there is degenerative C3-4 and C6-7 foraminal impingement. There was diffusely patent spinal canal.  Heunderwent C4 corpectomywith C3-5 plating, T6-T10 PSF with tumor decompression, and T8 laminectomyon 10/30/2016. Pathologyof the T8 lesion and C4 lesion confirmed renal cell carcinoma.  He received radiation from 12/05/2016 - 12/21/2016.   He began cabozantinib (Cometriq).  He received radiation from 12/05/2016 - 12/21/2016. Treatment was held during radiation.     He developed progressive microcytic anemia c/w iron deficiency and anemia of chronic disease.  After his surgery at [facility] in 05/2016, he required 2 units of PRBCs.  MCV was 74.  Anemia work-up on 07/31/2016 revealed a ferritin 318  (falsely elevated secondary to ESR > 140), iron saturation 12%, TIBC 217 (low), and retic 2.5%.  He has received Venofer x 3 (08/22/2016, 09/05/2016, 09/19/2016, 11/10/2016, and 11/17/2016) with improvement in his hematocrit.  He has restrictive lung disease secondary to his weight and prior radiation.  He has been prescribed oxygen (not received).    His psoriasis flared on nivolumab, but has improved with topical steroids.  Psoriasis was back to baseline after 2 months off nivolumab.  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  cabozantinib started on 12/28/16.    Interval history- he remains on cabozantinib. He had some diarrhea and excessive fatigue last week. Diarrhea has resolved now. Still feels fatigued. He feels depressed due to loss of job and lack of social support in addition to his malignancy. He is on zoloft 100 mg daily. Denies any suicidal ideations  ECOG PS- 1 Pain scale- 7 Opioid associated constipation- no  Review of systems- Review of Systems  Constitutional: Positive for malaise/fatigue. Negative for chills, fever and weight loss.  HENT: Negative for congestion, ear discharge and nosebleeds.   Eyes: Negative for blurred vision.  Respiratory: Negative for cough, hemoptysis, sputum production, shortness of breath and wheezing.   Cardiovascular: Negative for chest pain, palpitations, orthopnea and claudication.  Gastrointestinal: Negative for abdominal pain, blood in stool, constipation, diarrhea, heartburn, melena, nausea and vomiting.  Genitourinary: Negative for dysuria, flank pain, frequency, hematuria and urgency.  Musculoskeletal: Negative for back pain, joint pain and myalgias.  Skin: Negative for rash.  Neurological: Negative for dizziness, tingling, focal weakness, seizures, weakness  and headaches.  Endo/Heme/Allergies: Does not bruise/bleed easily.  Psychiatric/Behavioral: Positive for depression. Negative for suicidal ideas. The patient does  not have insomnia.       No Known Allergies   Past Medical History:  Diagnosis Date  . Acid reflux   . Anxiety   . Arthritis   . CHF (congestive heart failure) ([place])   . Depression   . Depression   . Hyperlipidemia   . Hypertension   . Myocardial infarction ([place])   . Renal cancer ([place])   . Renal cell carcinoma ([place])   . Renal insufficiency   . Skin cancer      Past Surgical History:  Procedure Laterality Date  . BACK SURGERY    . CARDIAC CATHETERIZATION     with stent  . CYSTOSCOPY WITH BIOPSY  03/06/2016   Procedure: CYSTOSCOPY WITH BIOPSY;  Surgeon: [name], MD;  Location: [facility] ORS;  Service: Urology;;  . [name] WITH URETHRAL DILATATION N/A 03/06/2016   Procedure: CYSTOSCOPY WITH URETHRAL DILATATION WITH CATHETER PLACEMENT;  Surgeon: [name], MD;  Location: [facility] ORS;  Service: Urology;  Laterality: N/A;  . KNEE SURGERY Left   . LEG SURGERY Left   . TONSILLECTOMY      Social History   Social History  . Marital status: Married    Spouse name: N/A  . Number of children: N/A  . Years of education: N/A   Occupational History  . Not on file.   Social History Main Topics  . Smoking status: Never Smoker  . Smokeless tobacco: Never Used  . 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 Drug use: No  . Sexual activity: Not on file   Other Topics Concern  . Not on file   Social History Narrative  . No narrative on file    Family History  Problem Relation Age of Onset  . Hypertension Father   . Heart attack Father   . Stroke Sister   . Hypertension Brother   . Stroke Maternal Uncle      Current Outpatient Prescriptions:  .  amLODipine (NORVASC) 10 MG tablet, Take 10 mg by mouth daily., Disp: , Rfl: 3 .  atorvastatin (LIPITOR) 20 MG tablet, TAKE 1 TABLET ONCE A DAY (AT BEDTIME) FOR 30 DAYS, Disp: , Rfl: 2 .  busPIRone (BUSPAR) 7.5 MG tablet, , Disp: , Rfl:  .  CABOMETYX 60 MG TABS, Take 60 mg by mouth daily., Disp: 30  tablet, Rfl: 2 .  cabozantinib s-malate (COMETRIQ) capsule (3 x 284mkit), Take 60 mg by mouth daily., Disp: 1 kit, Rfl: 1 .  carvedilol (COREG) 12.5 MG tablet, TAKE 1 TABLET BY MOUTH TWICE A DAY FOR 30 DAYS, Disp: , Rfl: 3 .  [employer] GENTLE LAXATIVE 10 MG suppository, INSERT 1 SUPPOSITORY RECTALLY EVERY 24HRS AS NEEDED FOR CONSTIPATION, Disp: , Rfl: 0 .  [employer] STOOL SOFTENER 8.6-50 MG tablet, TAKE 1 TABLET BY MOUTH TWICE A DAY AS NEEDED CONSTIPATION, Disp: , Rfl: 0 .  fluocinonide cream (LIDEX) 0.05 %, , Disp: , Rfl:  .  gabapentin (NEURONTIN) 300 MG capsule, Take 300 mg by mouth 3 (three) times daily. , Disp: , Rfl:  .  ibuprofen (ADVIL,MOTRIN) 200 MG tablet, Take 200 mg by mouth every 8 (eight) hours as needed for moderate pain. 4 every 8 hours, Disp: , Rfl:  .  ipratropium-albuterol (DUONEB) 0.5-2.5 (3) MG/3ML SOLN, , Disp: , Rfl:  .  lisinopril (PRINIVIL,ZESTRIL) 10 MG tablet, ,  Disp: , Rfl:  .  LORazepam (ATIVAN) 0.5 MG tablet, Take 1 tablet (0.5 mg total) by mouth every 8 (eight) hours., Disp: 30 tablet, Rfl: 0 .  morphine (MS CONTIN) 30 MG 12 hr tablet, Take 1 tablet (30 mg total) by mouth every 8 (eight) hours., Disp: 90 tablet, Rfl: 0 .  oxyCODONE (OXY IR/ROXICODONE) 5 MG immediate release tablet, Take 10 mg by mouth every 6 (six) hours as needed (every 6 h prn). , Disp: , Rfl:  .  potassium chloride SA (K-DUR,KLOR-CON) 20 MEQ tablet, Take 1 tablet (20 mEq total) by mouth daily. For 3 days, Disp: 30 tablet, Rfl: 0 .  senna (SENOKOT) 8.6 MG tablet, Take 2 tablets by mouth at bedtime. , Disp: , Rfl:  .  sertraline (ZOLOFT) 100 MG tablet, Take 100 mg by mouth at bedtime., Disp: , Rfl: 1 .  tenofovir (VIREAD) 300 MG tablet, Take 1 tablet (300 mg total) by mouth daily. (Patient not taking: Reported on 11/10/2016), Disp: 30 tablet, Rfl: 11 .  traZODone (DESYREL) 50 MG tablet, Take 50 mg by mouth at bedtime. , Disp: , Rfl: 3 .  triamcinolone cream (KENALOG) 0.5 %, Apply topically 2 (two) times daily.,  Disp: 30 g, Rfl: 1 .  XARELTO 20 MG TABS tablet, Take 20 mg by mouth daily., Disp: , Rfl: 1  Current Facility-Administered Medications:  .  0.9 %  sodium chloride infusion, , Intravenous, Once, [name], MD  Physical exam:  Vitals:   01/19/17 0829  BP: (!) 151/96  Pulse: (!) 106  Resp: 20  Temp: (!) 96.4 F (35.8 C)  TempSrc: Tympanic  Weight: 210 lb 7 oz (95.5 kg)   Physical Exam  Constitutional: He is oriented to person, place, and time.  Appears fatigued. In no acute distress  HENT:  Head: Normocephalic and atraumatic.  Eyes: Pupils are equal, round, and reactive to light. EOM are normal.  Neck: Normal range of motion.  Cardiovascular: Normal rate, regular rhythm and normal heart sounds.   Pulmonary/Chest: Effort normal and breath sounds normal.  Abdominal: Soft. Bowel sounds are normal.  Umbilical hernia  Neurological: He is alert and oriented to person, place, and time.  Skin: Skin is warm and dry.     CMP Latest Ref Rng & Units 01/19/2017  Glucose 65 - 99 mg/dL 108(H)  BUN 6 - 20 mg/dL 18  Creatinine 0.61 - 1.24 mg/dL 0.94  Sodium 135 - 145 mmol/L 141  Potassium 3.5 - 5.1 mmol/L 3.9  Chloride 101 - 111 mmol/L 100(L)  CO2 22 - 32 mmol/L 30  Calcium 8.9 - 10.3 mg/dL 9.3  Total Protein 6.5 - 8.1 g/dL 8.4(H)  Total Bilirubin 0.3 - 1.2 mg/dL 0.7  Alkaline Phos 38 - 126 U/L 136(H)  AST 15 - 41 U/L 61(H)  ALT 17 - 63 U/L 56   CBC Latest Ref Rng & Units 01/19/2017  WBC 3.8 - 10.6 K/uL 4.0  Hemoglobin 13.0 - 18.0 g/dL 15.1  Hematocrit 40.0 - 52.0 % 46.7  Platelets 150 - 440 K/uL 123(L)      Assessment and plan- Patient is a 61 y.o. male with metastatic renal cell carcinoma currently on cabozantinib  1. Continue cabozantinib. rtc in 2 weeks with cbc, cmp, magnesium and phos to see [name]. Check repeat tsh with next visit  2. Iron deficiency anemia- improved with venofer. Continue to monitor  3. H/o depression- we will get in touch with his pcp to  see if his zoloft  can be increased to 200 mg from 100 mg. Check tsh. Refer to social work  4. Abnormal LFT's- ast mildly elevated at 61. Alk phos elevated at 136. Total bili normal. Monitor closely in 2 weeks    Visit Diagnosis 1. Malignant neoplasm metastatic to lung, unspecified laterality ([place])   2. Bone metastasis ([place])   3. Renal cell carcinoma, unspecified laterality ([place])   4. High risk medication use      Dr. Randa Evens, MD, MPH [facility] at [facility] Pager- [phone] 01/19/2017 9:16 AM               \

## 2017-01-19 NOTE — Progress Notes (Signed)
Patient states last week was a bad week.  He did not feel good, no energy, no appetite.  States he had the "blahs".  States everything feels like a chore.  States he is eating better this week.  Wonders if some of this is the new medication.  BP elevated 151/98 HR 106

## 2017-01-30 ENCOUNTER — Other Ambulatory Visit: Payer: Medicaid Other

## 2017-01-30 ENCOUNTER — Ambulatory Visit: Payer: Medicaid Other | Admitting: Hematology and Oncology

## 2017-01-30 ENCOUNTER — Ambulatory Visit: Payer: Medicaid Other

## 2017-01-31 ENCOUNTER — Ambulatory Visit
Admission: RE | Admit: 2017-01-31 | Discharge: 2017-01-31 | Disposition: A | Discharge status: Home / Self Care | Payer: Medicaid Other | Source: Ambulatory Visit | Attending: Radiation Oncology | Admitting: Radiation Oncology

## 2017-01-31 VITALS — BP 118/82 | HR 75 | Temp 97.5°F | Resp 20 | Ht 75.0 in | Wt 215.1 lb

## 2017-01-31 DIAGNOSIS — Z923 Personal history of irradiation: Secondary | ICD-10-CM | POA: Diagnosis not present

## 2017-01-31 DIAGNOSIS — C7951 Secondary malignant neoplasm of bone: Secondary | ICD-10-CM | POA: Insufficient documentation

## 2017-01-31 DIAGNOSIS — C649 Malignant neoplasm of unspecified kidney, except renal pelvis: Secondary | ICD-10-CM

## 2017-01-31 NOTE — Progress Notes (Signed)
Radiation Oncology Follow up Note  Name: Marc Blake   Date:   01/31/2017 MRN:  [id number] DOB: [birth year]    This 61 y.o. male presents to the clinic today for one-month follow-up status post rate palliative radiation therapy to his thoracic and cervical spine for metastatic renal cell carcinoma.  REFERRING PROVIDER: [name], MD  HPI: patient is now seen out 1 month having completed palliative radiation therapy for metastatic disease from primary renal cell carcinoma to his cervical thoracic spine. Seen today in routine follow-up he is doing fairly well he states the pain has been improved..patient is initially presented with paralysis from the waist down had metastatic disease noted in T8underwent T8 corpectomy and interbody fusion with pathology showing metastatic renal cell carcinoma.he is currently oncabozantiniband tolerating that well.he specifically denies any significant difficulty with ambulation he does use a cane for assistance.  COMPLICATIONS OF TREATMENT: none  FOLLOW UP COMPLIANCE: keeps appointments   PHYSICAL EXAM:  BP 118/82 (BP Location: Left Arm, Patient Position: Sitting, Cuff Size: Large)   Pulse 75   Temp (!) 97.5 F (36.4 C) (Tympanic)   Resp 20   Ht 6\' 3"  (1.905 m)   Wt 215 lb 0.9 oz (97.6 kg)   BMI 26.88 kg/m  Well-developed male in NAD. Deep palpation of his spine does not elicit pain motor sensory and DTR levels appear equal and symmetric in the upper lower extremities. Well-developed well-nourished patient in NAD. HEENT reveals PERLA, EOMI, discs not visualized.  Oral cavity is clear. No oral mucosal lesions are identified. Neck is clear without evidence of cervical or supraclavicular adenopathy. Lungs are clear to A&P. Cardiac examination is essentially unremarkable with regular rate and rhythm without murmur rub or thrill. Abdomen is benign with no organomegaly or masses noted. Motor sensory and DTR levels are equal and symmetric in the upper  and lower extremities. Cranial nerves II through XII are grossly intact. Proprioception is intact. No peripheral adenopathy or edema is identified. No motor or sensory levels are noted. Crude visual fields are within normal range.  RADIOLOGY RESULTS: no current films for review  PLAN: at this time I'm turning follow-up care over to medical oncology. He continues close follow-up care in the department current therapy. I will see the patient back for consultation should any other areas knee palliative treatment.  I would like to take this opportunity to thank you for allowing me to participate in the care of your patient.Armstead Peaks., MD

## 2017-01-31 NOTE — Progress Notes (Signed)
Patient here for follow up. States he has still has severe pain in his back.

## 2017-02-02 ENCOUNTER — Inpatient Hospital Stay (HOSPITAL_BASED_OUTPATIENT_CLINIC_OR_DEPARTMENT_OTHER): Payer: Medicaid Other | Admitting: Hematology and Oncology

## 2017-02-02 ENCOUNTER — Telehealth: Payer: Self-pay | Admitting: *Deleted

## 2017-02-02 ENCOUNTER — Inpatient Hospital Stay: Payer: Medicaid Other

## 2017-02-02 ENCOUNTER — Other Ambulatory Visit: Payer: Self-pay | Admitting: *Deleted

## 2017-02-02 ENCOUNTER — Encounter: Payer: Self-pay | Admitting: Hematology and Oncology

## 2017-02-02 VITALS — BP 116/75 | HR 106 | Temp 97.7°F | Resp 16 | Ht 75.0 in | Wt 214.3 lb

## 2017-02-02 DIAGNOSIS — R7989 Other specified abnormal findings of blood chemistry: Secondary | ICD-10-CM

## 2017-02-02 DIAGNOSIS — C649 Malignant neoplasm of unspecified kidney, except renal pelvis: Secondary | ICD-10-CM

## 2017-02-02 DIAGNOSIS — F419 Anxiety disorder, unspecified: Secondary | ICD-10-CM | POA: Diagnosis not present

## 2017-02-02 DIAGNOSIS — I509 Heart failure, unspecified: Secondary | ICD-10-CM

## 2017-02-02 DIAGNOSIS — R918 Other nonspecific abnormal finding of lung field: Secondary | ICD-10-CM

## 2017-02-02 DIAGNOSIS — C7951 Secondary malignant neoplasm of bone: Secondary | ICD-10-CM

## 2017-02-02 DIAGNOSIS — Z86718 Personal history of other venous thrombosis and embolism: Secondary | ICD-10-CM

## 2017-02-02 DIAGNOSIS — I10 Essential (primary) hypertension: Secondary | ICD-10-CM | POA: Diagnosis not present

## 2017-02-02 DIAGNOSIS — C7800 Secondary malignant neoplasm of unspecified lung: Secondary | ICD-10-CM

## 2017-02-02 DIAGNOSIS — M129 Arthropathy, unspecified: Secondary | ICD-10-CM | POA: Diagnosis not present

## 2017-02-02 DIAGNOSIS — Z7189 Other specified counseling: Secondary | ICD-10-CM

## 2017-02-02 DIAGNOSIS — K219 Gastro-esophageal reflux disease without esophagitis: Secondary | ICD-10-CM | POA: Diagnosis not present

## 2017-02-02 DIAGNOSIS — Z7901 Long term (current) use of anticoagulants: Secondary | ICD-10-CM

## 2017-02-02 DIAGNOSIS — R5383 Other fatigue: Secondary | ICD-10-CM

## 2017-02-02 DIAGNOSIS — Z85828 Personal history of other malignant neoplasm of skin: Secondary | ICD-10-CM

## 2017-02-02 DIAGNOSIS — D509 Iron deficiency anemia, unspecified: Secondary | ICD-10-CM

## 2017-02-02 DIAGNOSIS — F329 Major depressive disorder, single episode, unspecified: Secondary | ICD-10-CM | POA: Diagnosis not present

## 2017-02-02 DIAGNOSIS — Z923 Personal history of irradiation: Secondary | ICD-10-CM

## 2017-02-02 DIAGNOSIS — G893 Neoplasm related pain (acute) (chronic): Secondary | ICD-10-CM

## 2017-02-02 DIAGNOSIS — I252 Old myocardial infarction: Secondary | ICD-10-CM

## 2017-02-02 DIAGNOSIS — Z8781 Personal history of (healed) traumatic fracture: Secondary | ICD-10-CM

## 2017-02-02 DIAGNOSIS — E785 Hyperlipidemia, unspecified: Secondary | ICD-10-CM | POA: Diagnosis not present

## 2017-02-02 LAB — COMPREHENSIVE METABOLIC PANEL
ALBUMIN: 3.6 g/dL (ref 3.5–5.0)
ALT: 57 U/L (ref 17–63)
ANION GAP: 9 (ref 5–15)
AST: 64 U/L — ABNORMAL HIGH (ref 15–41)
Alkaline Phosphatase: 118 U/L (ref 38–126)
BILIRUBIN TOTAL: 0.7 mg/dL (ref 0.3–1.2)
BUN: 24 mg/dL — ABNORMAL HIGH (ref 6–20)
CHLORIDE: 103 mmol/L (ref 101–111)
CO2: 28 mmol/L (ref 22–32)
Calcium: 9 mg/dL (ref 8.9–10.3)
Creatinine, Ser: 1.19 mg/dL (ref 0.61–1.24)
GFR calc Af Amer: >60 mL/min (ref >60)
Glucose, Bld: 76 mg/dL (ref 65–99)
POTASSIUM: 4.1 mmol/L (ref 3.5–5.1)
Sodium: 140 mmol/L (ref 135–145)
TOTAL PROTEIN: 7.9 g/dL (ref 6.5–8.1)

## 2017-02-02 LAB — CBC WITH DIFFERENTIAL/PLATELET
BASOS ABS: 0.1 10*3/uL (ref 0–0.1)
Basophils Relative: 2 %
EOS ABS: 0.2 10*3/uL (ref 0–0.7)
EOS PCT: 4 %
HEMATOCRIT: 42.4 % (ref 40.0–52.0)
Hemoglobin: 14.1 g/dL (ref 13.0–18.0)
LYMPHS ABS: 0.4 10*3/uL — AB (ref 1.0–3.6)
Lymphocytes Relative: 9 %
MCH: 26.9 pg (ref 26.0–34.0)
MCHC: 33.2 g/dL (ref 32.0–36.0)
MCV: 80.9 fL (ref 80.0–100.0)
MONO ABS: 0.7 10*3/uL (ref 0.2–1.0)
Monocytes Relative: 16 %
Neutro Abs: 3 10*3/uL (ref 1.4–6.5)
Neutrophils Relative %: 69 %
PLATELETS: 134 10*3/uL — AB (ref 150–440)
RBC: 5.24 MIL/uL (ref 4.40–5.90)
RDW: 23.9 % — AB (ref 11.5–14.5)
WBC: 4.3 10*3/uL (ref 3.8–10.6)

## 2017-02-02 LAB — PHOSPHORUS: Phosphorus: 2.8 mg/dL (ref 2.5–4.6)

## 2017-02-02 LAB — TSH: TSH: 4.649 u[IU]/mL — ABNORMAL HIGH (ref 0.350–4.500)

## 2017-02-02 LAB — MAGNESIUM: MAGNESIUM: 2.1 mg/dL (ref 1.7–2.4)

## 2017-02-02 NOTE — Progress Notes (Signed)
Patient here for follow up. No changes since last appointment.  

## 2017-02-02 NOTE — Telephone Encounter (Signed)
Spoke to Dr, Mike Gip about abnormal TSH and she wants me to add Free t4. I spoke to [place] and he said it can be added on.   I added lab on and test will be taken care of.

## 2017-02-02 NOTE — Progress Notes (Signed)
[facility] day:  02/02/2017   Chief Complaint: Marc Blake is a 61 y.o. male with metastatic renal cell carcinoma who is seen for 1 month assessment.  HPI: The patient was last seen in the medical oncology clinic by me on 12/28/2016.  At that time, he had completed radiation on 12/28/2016.  He restarted cabozantinib.  Labs included a hematocrit of 42.0, hemoglobin 13.5, MCV 77.6, platelets 233,000, white count 6500 with an ANC of 4500. Creatinine was 1.48, calcium 10.5, and albumin 4.1.  Ferritin was 254.  Iron studies included a saturation of 18% a TIBC of 304.  He saw Dr. Janese Banks in my absence on 01/19/2017.  He was fatigued and depressed.  He was taking Zoloft 100 mg a day.  Diarrhea noted the week prior had resolved.  During the interim, patient reports that he is feeling well. He is doing well on his current Cabozantinib. His diarrhea has resolved. Patient states, "I am on an 11 day streak of feeling good. I have been running errands". Patient's mobility has improved. He is using a cane in the office today rather than his normal rolling walker. Patient walked from inpatient unit in hospital to the cancer center today.  Patient's depression has improved, but he continues to report financial concerns that "get him down" sometimes. He is having problems getting his governmental assistance.    Past Medical History:  Diagnosis Date  . Acid reflux   . Anxiety   . Arthritis   . CHF (congestive heart failure) ([place])   . Depression   . Depression   . Hyperlipidemia   . Hypertension   . Myocardial infarction ([place])   . Renal cancer ([place])   . Renal cell carcinoma ([place])   . Renal insufficiency   . Skin cancer     Past Surgical History:  Procedure Laterality Date  . BACK SURGERY    . CARDIAC CATHETERIZATION     with stent  . CYSTOSCOPY WITH BIOPSY  03/06/2016   Procedure: CYSTOSCOPY WITH BIOPSY;  Surgeon: [name], MD;  Location: [facility] ORS;   Service: Urology;;  . [name] WITH URETHRAL DILATATION N/A 03/06/2016   Procedure: CYSTOSCOPY WITH URETHRAL DILATATION WITH CATHETER PLACEMENT;  Surgeon: [name], MD;  Location: [facility] ORS;  Service: Urology;  Laterality: N/A;  . KNEE SURGERY Left   . LEG SURGERY Left   . TONSILLECTOMY      Family History  Problem Relation Age of Onset  . Hypertension Father   . Heart attack Father   . Stroke Sister   . Hypertension Brother   . Stroke Maternal Uncle     Social History:  reports that he has never smoked. He has never used smokeless tobacco. He reports that he does not drink alcohol or use drugs.  He is originally from [place].  He then lived in [place] for 17 years.  He moved to [place] with his wife.  He lost his job of 35 years in early 09/2016.  He is alone today.  Allergies: No Known Allergies  Current Medications: Current Outpatient Prescriptions  Medication Sig Dispense Refill  . amLODipine (NORVASC) 10 MG tablet Take 10 mg by mouth daily.  3  . atorvastatin (LIPITOR) 20 MG tablet TAKE 1 TABLET ONCE A DAY (AT BEDTIME) FOR 30 DAYS  2  . busPIRone (BUSPAR) 7.5 MG tablet     . CABOMETYX 60 MG TABS Take 60 mg by mouth daily. 30 tablet 2  .  cabozantinib s-malate (COMETRIQ) capsule (3 x 45m kit) Take 60 mg by mouth daily. 1 kit 1  . carvedilol (COREG) 12.5 MG tablet TAKE 1 TABLET BY MOUTH TWICE A DAY FOR 30 DAYS  3  . [employer] GENTLE LAXATIVE 10 MG suppository INSERT 1 SUPPOSITORY RECTALLY EVERY 24HRS AS NEEDED FOR CONSTIPATION  0  . [employer] STOOL SOFTENER 8.6-50 MG tablet TAKE 1 TABLET BY MOUTH TWICE A DAY AS NEEDED CONSTIPATION  0  . fluocinonide cream (LIDEX) 0.05 %     . gabapentin (NEURONTIN) 300 MG capsule Take 300 mg by mouth 3 (three) times daily.     .Marland Kitchenibuprofen (ADVIL,MOTRIN) 200 MG tablet Take 200 mg by mouth every 8 (eight) hours as needed for moderate pain. 4 every 8 hours    . ipratropium-albuterol (DUONEB) 0.5-2.5 (3) MG/3ML SOLN     . lisinopril  (PRINIVIL,ZESTRIL) 10 MG tablet     . LORazepam (ATIVAN) 0.5 MG tablet Take 1 tablet (0.5 mg total) by mouth every 8 (eight) hours. 30 tablet 0  . morphine (MS CONTIN) 30 MG 12 hr tablet Take 1 tablet (30 mg total) by mouth every 8 (eight) hours. 90 tablet 0  . oxyCODONE (OXY IR/ROXICODONE) 5 MG immediate release tablet Take 10 mg by mouth every 6 (six) hours as needed (every 6 h prn).     . potassium chloride SA (K-DUR,KLOR-CON) 20 MEQ tablet Take 1 tablet (20 mEq total) by mouth daily. For 3 days 30 tablet 0  . senna (SENOKOT) 8.6 MG tablet Take 2 tablets by mouth at bedtime.     . sertraline (ZOLOFT) 100 MG tablet Take 100 mg by mouth at bedtime.  1  . tenofovir (VIREAD) 300 MG tablet Take 1 tablet (300 mg total) by mouth daily. 30 tablet 11  . traZODone (DESYREL) 50 MG tablet Take 50 mg by mouth at bedtime.   3  . triamcinolone cream (KENALOG) 0.5 % Apply topically 2 (two) times daily. 30 g 1  . XARELTO 20 MG TABS tablet Take 20 mg by mouth daily.  1   Current Facility-Administered Medications  Medication Dose Route Frequency Provider Last Rate Last Dose  . 0.9 %  sodium chloride infusion   Intravenous Once CLequita Asal MD        Review of Systems:  GENERAL:  Frustrated.  No fevers or sweats.  Weight gain of 4 pounds since last visit..Marland KitchenPERFORMANCE STATUS (ECOG):  2-3 HEENT:  No visual changes, runny nose, sore throat, mouth sores or tenderness.  Waiting on decision to fix teeth. Lungs:  Shortness of breath on exertion.  No cough or wheezing.  No hemoptysis. Cardiac:  No chest pain, palpitations, orthopnea, or PND. GI:  Chronic constipation.  No nausea, vomiting, diarrhea, melena or hematochezia. GU:  No urgency, frequency, dysuria, or hematuria. Self caths 3-4 x/week. Musculoskeletal:  Back and shoulder pain, improved.  No muscle tenderness. Extremities:  Chronic left lower extremity swelling. Skin:  Eczema on elbows and back. Neuro:  Poor memory.  No focal numbness.   Decreased movement in left arm (slightly improved).  No headache.  Decreased feeling left leg (chronic). Endocrine:  No diabetes, thyroid issues, hot flashes or night sweats. Psych:  Struggling with not working.  Anxiety. Pain: Back pain 6 out of 10. Review of systems:  All other systems reviewed and found to be negative.  Physical Exam: Blood pressure 116/75, pulse (!) 106, temperature 97.7 F (36.5 C), temperature source Tympanic, resp. rate 16, height 6' 3" (  1.905 m), weight 214 lb 4.8 oz (97.2 kg). GENERAL:  Slightly fatigued appearing gentleman sitting comfortably in a chair in the exam room in no acute distress.  MENTAL STATUS:  Alert and oriented to person, place and time. HEAD:  Long gray hair with goatee.  Normocephalic, atraumatic, face symmetric, no Cushingoid features. EYES:  Pupils equal round and reactive to light and accomodation.  No conjunctivitis or scleral icterus. ENT:  Oropharynx clear without lesion.  Poor dentition.  Tongue normal. Mucous membranes moist.  NECK:  Brace in place. RESPIRATORY:  Clear to auscultation without rales, wheezes or rhonchi. CARDIOVASCULAR:  Regular rate and rhythm without murmur, rub or gallop.  ABDOMEN:  Soft, non-tender, with active bowel sounds, and no hepatosplenomegaly.  No masses. SKIN:  Tattoos. Eczema on dorsal surface of forearms and lower back, minimal. EXTREMITIES:  No skin discoloration or tenderness.  No palpable cords. LYMPH NODES: No palpable cervical, supraclavicular, axillary or inguinal adenopathy  NEUROLOGICAL:  Decreased ROM left arm, improved.  Grips strength symmetric. PSYCH:  Appropriate.  Smiling more.   Imaging studies: 12/28/2013:  Abdominal and pelvic CT scan revealed an 8.5 x 10.2 x 9.2 cm irregularly enhancing mass of the left kidney with some internal calcifications. There were enlarged left sided renal hilar lymph nodes. There were multiple pulmonary nodules in both lower lobes (largest 1.1 cm). There was a  destructive bone lesion with an irregular 8.6 x 5.1 cm soft tissue mass invasive of the spinal canal narrowing at least 50% and disturbing the pedicle and portions of the left transverse process rib and T8 vertebral body.  01/15/2014:  Chest CT revealed innumerable pulmonary nodules, a large metastatic lesion at T8 and left eighth rib with invasion into the spinal canal and moderate canal stenosis. There was a left upper pole right kidney mass.  Head MRI was negative. 05/31/2015:  Chest, abdomen, and pelvic CT revealed slight enlargement of multiple pulmonary nodules.  The left renal mass was similar (12.2 x 9.6 cm).  The right kidney lesion was stable (lower pole lesion 2 x 1.7 cm).  Bone scan on 05/31/2015 revealed slight increased activity in the left costal vertebral junction at T7. 12/14/2015:  Chest, abdomen, and pelvic CT revealed an irregular 10.7 cm heterogeneous renal cortical mass in the upper left kidney and a heterogeneous 2.3 cm renal cortical mass in the lateral lower right kidney.  There was mild bilateral hydroureteronephrosis with bladder distention.  There was mild left para-aortic lymphadenopathy.  There were numerous (greater than 20) pulmonary metastases.  Largest nodule was 2.1 cm in the RLL. There was a partially visualized mildly expansile lytic lesion in the left proximal femur status post surgical transfixation.  Thre was a small probably loculated pleural effusion at the left eighth rib resection site.  There were no other bone abnormalities. 12/24/2015:  Bone scan revealed photopenia from prior removal of the left eighth rib. There was increased  uptake in the medial thoracic spine from T7 and T9 is felt to be due to postoperative fusion.  There was increased uptake throughout much of the left femur as well as increased uptake in the soft tissues of much of the left femur which may be due to previous surgery and radiation therapy change. 03/10/2016:  Chest CT revealed unchanged  bilateral pulmonary nodules and a left renal mass. 08/08/2016:  Chest, abdomen, and pelvic CT revealed decreasing left kidney lesion, stable to increase in multifocal pulmonary nodules and a similar appearance of prominent upper abdominal lymph nodes  that were worrisome for metastatic adenopathy. 10/20/2016:  Chest, abdomen, and pelvic CT revealed progressive disease with enlarging left renal mass and pulmonary nodules.  Stable lytic lesion in posterior elements of T8.   10/23/2016:  Bone scan revealed activity identified in the region of prior thoracic spine surgery. Local progression of concern.  There was uptake in the posterior left ninth rib was new since 12/23/2015 corresponds to lytic lesion on recent CT. This is compatible with metastatic disease.  There was uptake in the distal left femur concerning for metastatic involvement.  There was anterior left seventh rib activity also concerning for metastatic deposit. 10/27/2016:  MRI of the cervical spine revealed C4 body fracture with up to 75% height loss, likely pathologic.  There was no visible epidural or foraminal infiltration, but post-contrast imaging would be more sensitive.  On the symptomatic left side there is C4-5 and C5-6 left foraminal narrowing.  On the right, there is degenerative C3-4 and C6-7 foraminal impingement.  There was diffusely patent spinal canal. 10/28/2016: MRI at [facility] revealed C4 pathologic fracture with minimal retropulsion and moderate canal stenosis. New enhancing soft tissue lesion in posterior elements of T8 causing anterior displacement with severe stenosis without cord signal abnormality.   Appointment on 02/02/2017  Component Date Value Ref Range Status  . WBC 02/02/2017 4.3  3.8 - 10.6 K/uL Final  . RBC 02/02/2017 5.24  4.40 - 5.90 MIL/uL Final  . Hemoglobin 02/02/2017 14.1  13.0 - 18.0 g/dL Final  . HCT 02/02/2017 42.4  40.0 - 52.0 % Final  . MCV 02/02/2017 80.9  80.0 - 100.0 fL Final  . MCH 02/02/2017 26.9   26.0 - 34.0 pg Final  . MCHC 02/02/2017 33.2  32.0 - 36.0 g/dL Final  . RDW 02/02/2017 23.9* 11.5 - 14.5 % Final  . Platelets 02/02/2017 134* 150 - 440 K/uL Final  . Neutrophils Relative % 02/02/2017 69  % Final  . Neutro Abs 02/02/2017 3.0  1.4 - 6.5 K/uL Final  . Lymphocytes Relative 02/02/2017 9  % Final  . Lymphs Abs 02/02/2017 0.4* 1.0 - 3.6 K/uL Final  . Monocytes Relative 02/02/2017 16  % Final  . Monocytes Absolute 02/02/2017 0.7  0.2 - 1.0 K/uL Final  . Eosinophils Relative 02/02/2017 4  % Final  . Eosinophils Absolute 02/02/2017 0.2  0 - 0.7 K/uL Final  . Basophils Relative 02/02/2017 2  % Final  . Basophils Absolute 02/02/2017 0.1  0 - 0.1 K/uL Final  . Sodium 02/02/2017 140  135 - 145 mmol/L Final  . Potassium 02/02/2017 4.1  3.5 - 5.1 mmol/L Final  . Chloride 02/02/2017 103  101 - 111 mmol/L Final  . CO2 02/02/2017 28  22 - 32 mmol/L Final  . Glucose, Bld 02/02/2017 76  65 - 99 mg/dL Final  . BUN 02/02/2017 24* 6 - 20 mg/dL Final  . Creatinine, Ser 02/02/2017 1.19  0.61 - 1.24 mg/dL Final  . Calcium 02/02/2017 9.0  8.9 - 10.3 mg/dL Final  . Total Protein 02/02/2017 7.9  6.5 - 8.1 g/dL Final  . Albumin 02/02/2017 3.6  3.5 - 5.0 g/dL Final  . AST 02/02/2017 64* 15 - 41 U/L Final  . ALT 02/02/2017 57  17 - 63 U/L Final  . Alkaline Phosphatase 02/02/2017 118  38 - 126 U/L Final  . Total Bilirubin 02/02/2017 0.7  0.3 - 1.2 mg/dL Final  . GFR calc non Af Amer 02/02/2017 >60  >60 mL/min Final  . GFR calc Af  Amer 02/02/2017 >60  >60 mL/min Final   Comment: (NOTE) The eGFR has been calculated using the CKD EPI equation. This calculation has not been validated in all clinical situations. eGFR's persistently <60 mL/min signify possible Chronic Kidney Disease.   . Anion gap 02/02/2017 9  5 - 15 Final  . Magnesium 02/02/2017 2.1  1.7 - 2.4 mg/dL Final    Assessment:  Marc Blake is a 61 y.o. male with metastatic renal cell carcinoma presenting in 12/2013.  He had a large  left renal mass, rib and vertebral body involvement, and multiple pulmonary nodules.  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    He was on Votrient (pazopanib) from 02/13/2014 - 01/21/2016.  He began nivolumab on 01/28/2016.  He received 3000 cGy to T7 - T9 and associated ribs beginning 05/11/2014.  On 06/15/2014, he received 1 dose of nivolumab.  He presented with progressive paralysis from the waist down on 07/06/2014.  He underwent left posterolateral thoracotomy with left chest wall resection with T8 corpectomy and interbody fusion with donor bone on 07/06/2014.  Pathology revealed metastatic renal cell carcinoma. Post-operatively, he developed sepsis secondary to a submandibular abscess dental abscess.  He underwent tracheostomy, I&D of a right neck abscess, tooth extraction, and PEG tube placement.  Zometa has subsequently been on hold.  He developed a pathologic fracture of the left femur.  He is s/p intramedullary nailing on 01/27/2015.  He received radiation to the left leg.  He was admitted on 04/26/2015 with aspiration pneumonia and opioid overdose. He is currently taking MSContin 15 mg BID and oxycodone 5 mg q day.  He developed a left lower extremity DVT (popliteal and soleal vein) on 06/07/2015.  Left lower extremity ultrasound on 02/07/2016 revealed no evidence of DVT.  He is on long term Xarelto.  Patient has a long standing history of an obstructive uropathy and elevated PVR.  He has undergone urethral dilatation on several occasions.  He underwent cystoscopy, balloon urethral dilatation, and bladder biopsy with Foley catheter placement on 03/06/2016.  Bladder biopsy revealed cystitis with reactive changes, negative for atypia and malignancy.  He was admitted to [facility] from 02/14/2016 - 02/16/2016 with chest pain.  Chest CT angiogram on 02/15/2016 ruled out pulmonary embolism. Imaging revealed slight progression of mediastinal left  hilar adenopathy was pulmonary metastatic disease. Stress test on 02/15/2016 ruled out cardiac ischemia.  He was treated with Augmentin for presumed lower extremity cellulitis.  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  There is possible increase in change in the lytic lesion with some movement at the fracture site.  He has broken the distal locking screw.  He ambulates with a rolling walker.  Bone scan at [facility] on 06/22/2016 revealed no definite evidence of osseous metastatic disease.  There were photopenic regions overlying the left posterior eighth rib and the proximal left femur, likely representing prior surgical resection  He underwent removal of prior rod/intramedullary nail, radical resection of left femur, resection of tumor, and left hemiarthroplasty at [facility] on 06/16/2016.  Pathology of the left proximal femur revealed metastatic carcinoma compatible with renal cell carcinoma.  He is scheduled to begin physical therapy.   He received 13 cycles of nivolumab (01/28/2016 - 06/06/2016; 08/08/2016 - 10/03/2016).  He tolerated his infusions well.  TSH was 6.239 (0.35-4.50) with a normal free T4 (0.79) on 02/25/2016.  TSH was 2.713 (normal) and free T4 0.79 (normal) on 05/19/2016.  Treatment was held on 05/19/2016 secondary to diarrhea and on 05/26/2016 secondary to unexplained hypotension.   Cortisol level was 7.9 with a repeat of 10.3 (normal) on 05/29/2016 at 8:26 AM.  ACTH was 39.1 (normal) at 10 AM on 05/26/2016.  Chest, abdomen, and pelvic CT on 10/20/2016 revealed progressive disease with enlarging left renal mass and pulmonary nodules.  Stable lytic lesion in posterior elements of T8.  Bone scan on 10/23/2016 revealed activity identified in the region of prior thoracic spine surgery. Local progression of concern.  There was uptake in the posterior left ninth rib is new since 12/23/2015 corresponds to lytic lesion on recent CT. This is compatible with metastatic disease.  There was  uptake in the distal left femur concerning for metastatic involvement.  There was anterior left seventh rib activity also concerning for metastatic deposit.  MRI of the cervical spine on 10/27/2016 revealed C4 body fracture with up to 75% height loss, likely pathologic.  There was no visible epidural or foraminal infiltration, but post-contrast imaging would be more sensitive.  On the symptomatic left side there is C4-5 and C5-6 left foraminal narrowing.  On the right, there is degenerative C3-4 and C6-7 foraminal impingement.  There was diffusely patent spinal canal.  He underwent  C4 corpectomy with C3-5 plating, T6-T10 PSF with tumor decompression, and T8 laminectomy on 10/30/2016.  Pathology of the T8 lesion and C4 lesion confirmed renal cell carcinoma.  He received radiation from 12/05/2016 - 12/21/2016.   He began cabozantinib (Cometriq) in 11/2016.  Treatment was held during radiation.  He restarted cabozantinib on 12/28/2016.  He developed progressive microcytic anemia c/w iron deficiency and anemia of chronic disease.  After his surgery at [facility] in 05/2016, he required 2 units of PRBCs.  MCV was 74.  Anemia work-up on 07/31/2016 revealed a ferritin 318 (falsely elevated secondary to ESR > 140), iron saturation 12%, TIBC 217 (low), and retic 2.5%.  He has received Venofer x 3 (08/22/2016, 09/05/2016, 09/19/2016, 11/10/2016, and 11/17/2016) with improvement in his hematocrit.  He has restrictive lung disease secondary to his weight and prior radiation.  He has been prescribed oxygen (not received).    His psoriasis flared on nivolumab, but has improved with topical steroids.  Psoriasis was back to baseline after 2 months off nivolumab.  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  Symptomatically, he is feeling well. Diarrhea has resolved. He has gained 4 pounds since his most recent clinic visit.  Hematocrit is 42.4 and hemoglobin 14.1. Renal function is stable.   Plan: 1.   Labs today:  CBC with diff, CMP, Mg, Phos, TSH. 2.  Continue Cabozantinib as previously prescribed.  3.  Pain management - patient sees Dr. Primus Bravo. Reporting that he is having trouble getting is pain medications. Given cancer diagnosis, we can begin to mange patient's pain medications in the cancer center.  He can only receive pain medications from one provider. 3.  RTC in 2 weeks for labs (CMP). 4.  RTC in 4 weeks for MD assessment, labs (CBC with diff., CMP, Mg, Phos).    C. Mike Gip, MD 02/02/2017, 12:17 PM

## 2017-02-03 LAB — T4, FREE: Free T4: 0.58 ng/dL — ABNORMAL LOW (ref 0.61–1.12)

## 2017-02-08 ENCOUNTER — Other Ambulatory Visit: Payer: Self-pay | Admitting: Urgent Care

## 2017-02-08 DIAGNOSIS — D649 Anemia, unspecified: Secondary | ICD-10-CM

## 2017-02-08 DIAGNOSIS — C7800 Secondary malignant neoplasm of unspecified lung: Secondary | ICD-10-CM

## 2017-02-08 DIAGNOSIS — Z7189 Other specified counseling: Secondary | ICD-10-CM

## 2017-02-08 DIAGNOSIS — C649 Malignant neoplasm of unspecified kidney, except renal pelvis: Secondary | ICD-10-CM

## 2017-02-08 DIAGNOSIS — F419 Anxiety disorder, unspecified: Secondary | ICD-10-CM

## 2017-02-08 DIAGNOSIS — G893 Neoplasm related pain (acute) (chronic): Secondary | ICD-10-CM

## 2017-02-08 DIAGNOSIS — I82532 Chronic embolism and thrombosis of left popliteal vein: Secondary | ICD-10-CM

## 2017-02-08 DIAGNOSIS — C7951 Secondary malignant neoplasm of bone: Secondary | ICD-10-CM

## 2017-02-08 MED ORDER — LORAZEPAM 0.5 MG PO TABS: 0.5000 mg | ORAL_TABLET | Freq: Three times a day (TID) | ORAL | 0 refills | Status: DC

## 2017-02-14 ENCOUNTER — Ambulatory Visit: Payer: Medicaid Other | Attending: Family Medicine

## 2017-02-14 DIAGNOSIS — R262 Difficulty in walking, not elsewhere classified: Secondary | ICD-10-CM | POA: Diagnosis present

## 2017-02-14 DIAGNOSIS — R278 Other lack of coordination: Secondary | ICD-10-CM | POA: Diagnosis present

## 2017-02-14 DIAGNOSIS — M6281 Muscle weakness (generalized): Secondary | ICD-10-CM | POA: Insufficient documentation

## 2017-02-14 NOTE — Therapy (Signed)
[place] MAIN [facility] SERVICES [address] [place], [place], [address] Phone: [phone]   Fax:  [phone]  Physical Therapy Evaluation  Patient Details  Name: Marc Blake MRN: [id number] Date of Birth: 03-21-1956 Referring Provider: [name]  Encounter Date: 02/14/2017      PT End of Session - 02/14/17 2130    Visit Number 1   Number of Visits 1   PT Start Time 1101   PT Stop Time 1200   PT Time Calculation (min) 59 min   Equipment Utilized During Treatment Gait belt   Activity Tolerance Patient tolerated treatment well   Behavior During Therapy [facility] for tasks assessed/performed      Past Medical History:  Diagnosis Date  . Acid reflux   . Anxiety   . Arthritis   . CHF (congestive heart failure) ([place])   . Depression   . Depression   . Hyperlipidemia   . Hypertension   . Myocardial infarction ([place])   . Renal cancer ([place])   . Renal cell carcinoma ([place])   . Renal insufficiency   . Skin cancer     Past Surgical History:  Procedure Laterality Date  . BACK SURGERY    . CARDIAC CATHETERIZATION     with stent  . CYSTOSCOPY WITH BIOPSY  03/06/2016   Procedure: CYSTOSCOPY WITH BIOPSY;  Surgeon: [name], MD;  Location: [facility] ORS;  Service: Urology;;  . [name] WITH URETHRAL DILATATION N/A 03/06/2016   Procedure: CYSTOSCOPY WITH URETHRAL DILATATION WITH CATHETER PLACEMENT;  Surgeon: [name], MD;  Location: [facility] ORS;  Service: Urology;  Laterality: N/A;  . KNEE SURGERY Left   . LEG SURGERY Left   . TONSILLECTOMY      There were no vitals filed for this visit.       Subjective Assessment - 02/14/17 1106    Subjective Patient is a pleasant 61 y.o male who presents to physical therapy for chronic pain due to neoplasm   Pertinent History Patient is a pleasant 61 year old male with metastatic renal cell carcinoma. He completed radiation on 12/28/16. On 10/27/2016 imaging found a C4 body fracture with  up to 75% height loss, He is now post status cervical corpectomy and thoracic tumor resection. He is unable to lay on stomach at this time and has been having increased low back pain.    Limitations Lifting;Sitting;Reading;Standing;Walking;House hold activities;Other (comment)   How long can you sit comfortably? 20 minutes   How long can you stand comfortably? 15 minutes   How long can you walk comfortably? 10 minutes   Patient Stated Goals decrease pain   Currently in Pain? Yes   Pain Score 7    Pain Location Back   Pain Orientation Right;Left;Lower   Pain Descriptors / Indicators Aching;Cramping;Throbbing   Pain Type Chronic pain   Pain Onset More than a month ago   Pain Frequency Constant   Aggravating Factors  bending forward   Pain Relieving Factors laying down      PAIN: Lower left right average pain 7/10 hurts worse when reaching over  POSTURE: kyphosis  PROM/AROM: Trunk Flexion Limited, requires knee bend, painful  Trunk Extension Limited 15 degrees, painful  Trunk R SB Require knee bend  Trunk L SB Require knee bend  Trunk R rotation painful  Trunk L rotation functional   Hamstring L 122 degrees in supine R 128   STRENGTH:  Graded on a 0-5 scale Muscle Group Left Right  Hip  Flex 4+/5 3-/5  Hip Abd 4+/5 4-/5  Hip Add 4+/5 4-5  Hip Ext 4+/5 4-/5  Hip IR/ER 4+/5 4-/5  Knee Flex 4+/5 4/5  Knee Ext 4+/5 4-/5  Ankle DF 4+/5 4-/5  Ankle PF 4+/5 4-5   SENSATION: Slight loss of sensation on L side    SPECIAL TESTS:  SLR- Unable to lay on stomach to assess spinal mobility  FUNCTIONAL MOBILITY: Slow transfers, excessive forward weight shift   OUTCOME MEASURES: TEST Outcome Interpretation  5 times sit<>stand 29 sec >60 yo, >15 sec indicates increased risk for falls   TherEx LE trunk rotations in supine  Hamstring stretch supine Hamstring stretch prone Seated rows Seated posture TA contractions Sit to stands 10x           [facility] PT Assessment -  02/14/17 0001      Assessment   Medical Diagnosis chronic pain due to neoplasm   Referring Provider [name] C   Onset Date/Surgical Date 06/16/17   Hand Dominance Right   Next MD Visit unknown   Prior Therapy Yes     Precautions   Precautions None     Restrictions   Weight Bearing Restrictions No     Balance Screen   Has the patient fallen in the past 6 months No   Has the patient had a decrease in activity level because of a fear of falling?  Yes   Is the patient reluctant to leave their home because of a fear of falling?  Yes     [place] residence   Living Arrangements Spouse/significant other   Available Help at Discharge Family   Type of [place] Access Level entry   [place] seat;Walker - 4 wheels;Cane - quad     Prior Function   Level of Independence Independent with household mobility with device     Cognition   Overall Cognitive Status Within Functional Limits for tasks assessed     Sensation   Light Touch Impaired by gross assessment  L less sensative compared to R     Coordination   Gross Motor Movements are Fluid and Coordinated No     Posture/Postural Control   Posture/Postural Control Postural limitations   Postural Limitations Rounded Shoulders;Forward head;Increased thoracic kyphosis;Flexed trunk     Transfers   Five time sit to stand comments  29 with use of hands     Ambulation/Gait   Ambulation/Gait Yes   Ambulation/Gait Assistance 6: Modified independent (Device/Increase time)   Gait Pattern Decreased arm swing - right;Decreased arm swing - left;Decreased step length - left;Decreased step length - right;Decreased weight shift to left;Decreased stance time - left;Step-through pattern;Decreased stride length;Shuffle;Antalgic            Objective measurements completed on examination: See above findings.                  PT  Education - 02/14/17 2130    Education provided Yes   Education Details HEP, posture, utilizing a towel in seated position    Person(s) Educated Patient   Methods Explanation;Demonstration;Handout   Comprehension Verbalized understanding;Returned demonstration             PT Long Term Goals - 02/14/17 2138      PT LONG TERM GOAL #1   Title Patient will be independent with HEP and demonstrate understanding to improve low back pain    Baseline  HEP given and patient demonstrated understanding   Time 1   Period Days   Status Achieved                Plan - 02/14/17 2136    Clinical Impression Statement Patient is a pleasant 61 year old male who presents to physical therapy with low back pain secondary to neoplasm/hx of cancer. Patient was limited in all trunk movements with flexion and extension leading to the greatest level of pain. Patient was unable to obtain prone position for assessment of spinal mobility and musculature. Hamstrings are tight bilaterally. STS =29 seconds indicating increased fall risk. Patient educated on strengthening and stretching to alleviate back pain and demonstrated understanding of proper postural corrections.    History and Personal Factors relevant to plan of care: This patient presents with 3, personal factors/ comorbidities xxx, and  4  body elements including body structures and functions, activity limitations and or participation restrictions. Patient's condition is unstable.    Clinical Presentation Unstable   Clinical Presentation due to: cancer   Clinical Decision Making Moderate      Patient will benefit from skilled therapeutic intervention in order to improve the following deficits and impairments:     Visit Diagnosis: Muscle weakness (generalized)  Other lack of coordination  Difficulty in walking, not elsewhere classified     Problem List Patient Active Problem List   Diagnosis Date Noted  . Anxiety 11/10/2016  .  Encounter for antineoplastic immunotherapy 10/23/2016  . Goals of care, counseling/discussion 10/23/2016  . Anemia, iron deficiency 09/06/2016  . Vaccine counseling 09/06/2016  . Arthralgia 08/15/2016  . Arthritis 08/01/2016  . Anemia 08/01/2016  . S/P radiation > 12 weeks 06/16/2016  . Elevated TSH 03/10/2016  . Mild eczema 02/27/2016  . Dyspnea on exertion 02/14/2016  . Elevated troponin 02/14/2016  . Fracture of right lower extremity 02/03/2016  . H/O resection of rib 02/03/2016  . Enlarged prostate without urinary obstruction 02/01/2016  . Urethral stricture 02/01/2016  . Personal history of deep vein thrombosis 02/01/2016  . CAD (coronary artery disease) 01/19/2016  . Mixed hyperlipidemia 01/19/2016  . Shortness of breath 01/19/2016  . Hydronephrosis 01/10/2016  . Incomplete bladder emptying 01/10/2016  . Chronic pain due to neoplasm 12/13/2015  . Essential hypertension 12/13/2015  . Major depressive disorder with single episode, in partial remission ([place]) 12/13/2015  . Deep venous thrombosis of left popliteal vein ([place]) 06/07/2015  . Renal cell carcinoma ([place]) 01/21/2014  . Bone metastasis ([place]) 01/21/2014  . Lung metastases ([place]) 01/21/2014  Janna Arch, PT, DPT    Janna Arch 02/14/2017, 9:41 PM  [place] MAIN [facility] SERVICES [address] [place], [place], [address] Phone: [phone]   Fax:  [phone]  Name: ZAYDIN BILLEY MRN: [id number] Date of Birth: Jul 22, 1955

## 2017-02-14 NOTE — Addendum Note (Signed)
Addended by: Judene Companion on: 02/14/2017 09:43 PM   Modules accepted: Orders

## 2017-02-16 ENCOUNTER — Telehealth: Payer: Self-pay | Admitting: *Deleted

## 2017-02-16 ENCOUNTER — Other Ambulatory Visit: Payer: Self-pay | Admitting: *Deleted

## 2017-02-16 ENCOUNTER — Inpatient Hospital Stay: Payer: Medicaid Other

## 2017-02-16 ENCOUNTER — Other Ambulatory Visit: Payer: Self-pay

## 2017-02-16 DIAGNOSIS — C7951 Secondary malignant neoplasm of bone: Secondary | ICD-10-CM

## 2017-02-16 DIAGNOSIS — C649 Malignant neoplasm of unspecified kidney, except renal pelvis: Secondary | ICD-10-CM

## 2017-02-16 DIAGNOSIS — Z7189 Other specified counseling: Secondary | ICD-10-CM

## 2017-02-16 DIAGNOSIS — C7800 Secondary malignant neoplasm of unspecified lung: Secondary | ICD-10-CM

## 2017-02-16 LAB — COMPREHENSIVE METABOLIC PANEL
ALT: 127 U/L — ABNORMAL HIGH (ref 17–63)
AST: 135 U/L — ABNORMAL HIGH (ref 15–41)
Albumin: 3.3 g/dL — ABNORMAL LOW (ref 3.5–5.0)
Alkaline Phosphatase: 344 U/L — ABNORMAL HIGH (ref 38–126)
Anion gap: 8 (ref 5–15)
BUN: 17 mg/dL (ref 6–20)
CO2: 31 mmol/L (ref 22–32)
Calcium: 8.9 mg/dL (ref 8.9–10.3)
Chloride: 101 mmol/L (ref 101–111)
Creatinine, Ser: 1.01 mg/dL (ref 0.61–1.24)
GFR calc Af Amer: >60 mL/min (ref >60)
GFR calc non Af Amer: >60 mL/min (ref >60)
Glucose, Bld: 90 mg/dL (ref 65–99)
Potassium: 3.7 mmol/L (ref 3.5–5.1)
Sodium: 140 mmol/L (ref 135–145)
Total Bilirubin: 1 mg/dL (ref 0.3–1.2)
Total Protein: 7.5 g/dL (ref 6.5–8.1)

## 2017-02-16 NOTE — Telephone Encounter (Signed)
Called and left patient a message to stop taking his cabometyx and to come on Tuesday for repeat labs.  Instructed patient to call the clinic back to let us know he received this message.

## 2017-02-16 NOTE — Telephone Encounter (Addendum)
Patient called back and states he has already taken cabometyx today but will not take any more until told, He asked what time to come in for labs on Tuesday told him 1145, he replied "10-4, see you then"

## 2017-02-20 ENCOUNTER — Inpatient Hospital Stay: Payer: Medicaid Other | Attending: Hematology and Oncology

## 2017-02-20 DIAGNOSIS — F329 Major depressive disorder, single episode, unspecified: Secondary | ICD-10-CM | POA: Insufficient documentation

## 2017-02-20 DIAGNOSIS — Z7901 Long term (current) use of anticoagulants: Secondary | ICD-10-CM | POA: Diagnosis not present

## 2017-02-20 DIAGNOSIS — Z86718 Personal history of other venous thrombosis and embolism: Secondary | ICD-10-CM | POA: Insufficient documentation

## 2017-02-20 DIAGNOSIS — K219 Gastro-esophageal reflux disease without esophagitis: Secondary | ICD-10-CM | POA: Diagnosis not present

## 2017-02-20 DIAGNOSIS — K047 Periapical abscess without sinus: Secondary | ICD-10-CM | POA: Diagnosis not present

## 2017-02-20 DIAGNOSIS — K122 Cellulitis and abscess of mouth: Secondary | ICD-10-CM | POA: Diagnosis not present

## 2017-02-20 DIAGNOSIS — I509 Heart failure, unspecified: Secondary | ICD-10-CM | POA: Diagnosis not present

## 2017-02-20 DIAGNOSIS — F419 Anxiety disorder, unspecified: Secondary | ICD-10-CM | POA: Insufficient documentation

## 2017-02-20 DIAGNOSIS — I10 Essential (primary) hypertension: Secondary | ICD-10-CM | POA: Diagnosis not present

## 2017-02-20 DIAGNOSIS — Z923 Personal history of irradiation: Secondary | ICD-10-CM | POA: Diagnosis not present

## 2017-02-20 DIAGNOSIS — Z79899 Other long term (current) drug therapy: Secondary | ICD-10-CM | POA: Insufficient documentation

## 2017-02-20 DIAGNOSIS — I252 Old myocardial infarction: Secondary | ICD-10-CM | POA: Insufficient documentation

## 2017-02-20 DIAGNOSIS — C649 Malignant neoplasm of unspecified kidney, except renal pelvis: Secondary | ICD-10-CM | POA: Diagnosis present

## 2017-02-20 DIAGNOSIS — Z85828 Personal history of other malignant neoplasm of skin: Secondary | ICD-10-CM | POA: Diagnosis not present

## 2017-02-20 DIAGNOSIS — E785 Hyperlipidemia, unspecified: Secondary | ICD-10-CM | POA: Insufficient documentation

## 2017-02-20 DIAGNOSIS — N289 Disorder of kidney and ureter, unspecified: Secondary | ICD-10-CM | POA: Insufficient documentation

## 2017-02-20 DIAGNOSIS — B181 Chronic viral hepatitis B without delta-agent: Secondary | ICD-10-CM | POA: Diagnosis not present

## 2017-02-21 ENCOUNTER — Ambulatory Visit: Payer: Self-pay

## 2017-02-21 LAB — HEPATITIS C ANTIBODY: HCV Ab: 0.1 s/co ratio (ref 0.0–0.9)

## 2017-02-21 LAB — HEPATITIS B SURFACE ANTIGEN: Hepatitis B Surface Ag: NEGATIVE

## 2017-02-21 LAB — HEPATITIS B CORE ANTIBODY, TOTAL: Hep B Core Total Ab: POSITIVE — AB

## 2017-02-22 ENCOUNTER — Other Ambulatory Visit: Payer: Self-pay | Admitting: Hematology and Oncology

## 2017-02-22 ENCOUNTER — Telehealth: Payer: Self-pay | Admitting: *Deleted

## 2017-02-22 DIAGNOSIS — I82532 Chronic embolism and thrombosis of left popliteal vein: Secondary | ICD-10-CM

## 2017-02-22 DIAGNOSIS — D649 Anemia, unspecified: Secondary | ICD-10-CM

## 2017-02-22 DIAGNOSIS — F419 Anxiety disorder, unspecified: Secondary | ICD-10-CM

## 2017-02-22 DIAGNOSIS — G893 Neoplasm related pain (acute) (chronic): Secondary | ICD-10-CM

## 2017-02-22 DIAGNOSIS — C7800 Secondary malignant neoplasm of unspecified lung: Secondary | ICD-10-CM

## 2017-02-22 DIAGNOSIS — M1990 Unspecified osteoarthritis, unspecified site: Secondary | ICD-10-CM

## 2017-02-22 DIAGNOSIS — Z7189 Other specified counseling: Secondary | ICD-10-CM

## 2017-02-22 DIAGNOSIS — C7951 Secondary malignant neoplasm of bone: Secondary | ICD-10-CM

## 2017-02-22 DIAGNOSIS — C649 Malignant neoplasm of unspecified kidney, except renal pelvis: Secondary | ICD-10-CM

## 2017-02-22 DIAGNOSIS — C642 Malignant neoplasm of left kidney, except renal pelvis: Secondary | ICD-10-CM

## 2017-02-22 MED ORDER — OXYCODONE HCL 5 MG PO TABS: 5.0000 mg | ORAL_TABLET | Freq: Four times a day (QID) | ORAL | 0 refills | Status: DC | PRN

## 2017-02-22 MED ORDER — LORAZEPAM 0.5 MG PO TABS: 0.5000 mg | ORAL_TABLET | Freq: Three times a day (TID) | ORAL | 0 refills | Status: AC

## 2017-02-22 MED ORDER — MORPHINE SULFATE ER 30 MG PO TBCR: 30.0000 mg | EXTENDED_RELEASE_TABLET | Freq: Three times a day (TID) | ORAL | 0 refills | Status: DC

## 2017-02-22 NOTE — Telephone Encounter (Signed)
-----   Message from [place] sent at 02/22/2017  2:03 PM EDT ----- LM about his meds oxycodone and morphine stated Mike Gip said she would go up on his morphine 30mg . Can you take care of this please

## 2017-02-22 NOTE — Telephone Encounter (Signed)
Patient had currently been on MS Contin 15 mg twice a day and Oxycodone 5 mg 4 times a day Also reports that pain clinic will not fill his Ativan any longer. I explained to him that Dr Mike Gip has agreed to prescribe his pain meds for him and he can pick up prescription tomorrow morning

## 2017-02-23 ENCOUNTER — Telehealth: Payer: Self-pay | Admitting: *Deleted

## 2017-02-23 DIAGNOSIS — C7800 Secondary malignant neoplasm of unspecified lung: Secondary | ICD-10-CM

## 2017-02-23 DIAGNOSIS — Z7189 Other specified counseling: Secondary | ICD-10-CM

## 2017-02-23 DIAGNOSIS — C649 Malignant neoplasm of unspecified kidney, except renal pelvis: Secondary | ICD-10-CM

## 2017-02-23 DIAGNOSIS — C642 Malignant neoplasm of left kidney, except renal pelvis: Secondary | ICD-10-CM

## 2017-02-23 DIAGNOSIS — F419 Anxiety disorder, unspecified: Secondary | ICD-10-CM

## 2017-02-23 DIAGNOSIS — G893 Neoplasm related pain (acute) (chronic): Secondary | ICD-10-CM

## 2017-02-23 DIAGNOSIS — M1990 Unspecified osteoarthritis, unspecified site: Secondary | ICD-10-CM

## 2017-02-23 DIAGNOSIS — D649 Anemia, unspecified: Secondary | ICD-10-CM

## 2017-02-23 DIAGNOSIS — C7951 Secondary malignant neoplasm of bone: Secondary | ICD-10-CM

## 2017-02-23 MED ORDER — OXYCODONE HCL 5 MG PO TABS: 5.0000 mg | ORAL_TABLET | Freq: Four times a day (QID) | ORAL | 0 refills | Status: AC | PRN

## 2017-02-23 MED ORDER — MORPHINE SULFATE ER 30 MG PO TBCR: 30.0000 mg | EXTENDED_RELEASE_TABLET | Freq: Two times a day (BID) | ORAL | 0 refills | Status: AC

## 2017-02-23 NOTE — Telephone Encounter (Signed)
I spoke with patient, he states he has not even picked up the prescription from our office as of yet. I retrieved the prescription from reception area and had doctor sign a new corrected prescription. Patient advised of correct dosing and that this has to last 30 days. He voiced understanding

## 2017-02-23 NOTE — Telephone Encounter (Signed)
I called patient and had to leave a message for him that the directions on his MS are incorrect and to please call me back in regards to this

## 2017-02-26 ENCOUNTER — Ambulatory Visit: Payer: Self-pay

## 2017-02-28 ENCOUNTER — Ambulatory Visit: Payer: Self-pay

## 2017-03-02 ENCOUNTER — Inpatient Hospital Stay (HOSPITAL_BASED_OUTPATIENT_CLINIC_OR_DEPARTMENT_OTHER): Payer: Medicaid Other | Admitting: Hematology and Oncology

## 2017-03-02 ENCOUNTER — Encounter: Payer: Self-pay | Admitting: Hematology and Oncology

## 2017-03-02 ENCOUNTER — Other Ambulatory Visit: Payer: Self-pay | Admitting: *Deleted

## 2017-03-02 ENCOUNTER — Telehealth: Payer: Self-pay | Admitting: Pharmacist

## 2017-03-02 ENCOUNTER — Inpatient Hospital Stay: Payer: Medicaid Other

## 2017-03-02 VITALS — BP 132/80 | HR 103 | Temp 98.0°F | Ht 75.0 in | Wt 215.4 lb

## 2017-03-02 DIAGNOSIS — K122 Cellulitis and abscess of mouth: Secondary | ICD-10-CM

## 2017-03-02 DIAGNOSIS — Z923 Personal history of irradiation: Secondary | ICD-10-CM

## 2017-03-02 DIAGNOSIS — I252 Old myocardial infarction: Secondary | ICD-10-CM | POA: Diagnosis not present

## 2017-03-02 DIAGNOSIS — N289 Disorder of kidney and ureter, unspecified: Secondary | ICD-10-CM

## 2017-03-02 DIAGNOSIS — E785 Hyperlipidemia, unspecified: Secondary | ICD-10-CM

## 2017-03-02 DIAGNOSIS — F419 Anxiety disorder, unspecified: Secondary | ICD-10-CM

## 2017-03-02 DIAGNOSIS — C7951 Secondary malignant neoplasm of bone: Secondary | ICD-10-CM

## 2017-03-02 DIAGNOSIS — Z85828 Personal history of other malignant neoplasm of skin: Secondary | ICD-10-CM

## 2017-03-02 DIAGNOSIS — C649 Malignant neoplasm of unspecified kidney, except renal pelvis: Secondary | ICD-10-CM

## 2017-03-02 DIAGNOSIS — Z7901 Long term (current) use of anticoagulants: Secondary | ICD-10-CM

## 2017-03-02 DIAGNOSIS — K219 Gastro-esophageal reflux disease without esophagitis: Secondary | ICD-10-CM | POA: Diagnosis not present

## 2017-03-02 DIAGNOSIS — K047 Periapical abscess without sinus: Secondary | ICD-10-CM | POA: Diagnosis not present

## 2017-03-02 DIAGNOSIS — I10 Essential (primary) hypertension: Secondary | ICD-10-CM | POA: Diagnosis not present

## 2017-03-02 DIAGNOSIS — R7989 Other specified abnormal findings of blood chemistry: Secondary | ICD-10-CM

## 2017-03-02 DIAGNOSIS — R945 Abnormal results of liver function studies: Secondary | ICD-10-CM

## 2017-03-02 DIAGNOSIS — Z7189 Other specified counseling: Secondary | ICD-10-CM

## 2017-03-02 DIAGNOSIS — F329 Major depressive disorder, single episode, unspecified: Secondary | ICD-10-CM

## 2017-03-02 DIAGNOSIS — I509 Heart failure, unspecified: Secondary | ICD-10-CM

## 2017-03-02 DIAGNOSIS — I82532 Chronic embolism and thrombosis of left popliteal vein: Secondary | ICD-10-CM

## 2017-03-02 DIAGNOSIS — Z86718 Personal history of other venous thrombosis and embolism: Secondary | ICD-10-CM

## 2017-03-02 DIAGNOSIS — B181 Chronic viral hepatitis B without delta-agent: Secondary | ICD-10-CM

## 2017-03-02 DIAGNOSIS — C7800 Secondary malignant neoplasm of unspecified lung: Secondary | ICD-10-CM

## 2017-03-02 DIAGNOSIS — Z79899 Other long term (current) drug therapy: Secondary | ICD-10-CM

## 2017-03-02 DIAGNOSIS — G893 Neoplasm related pain (acute) (chronic): Secondary | ICD-10-CM | POA: Insufficient documentation

## 2017-03-02 LAB — COMPREHENSIVE METABOLIC PANEL
ALT: 23 U/L (ref 17–63)
AST: 32 U/L (ref 15–41)
Albumin: 3.4 g/dL — ABNORMAL LOW (ref 3.5–5.0)
Alkaline Phosphatase: 127 U/L — ABNORMAL HIGH (ref 38–126)
Anion gap: 7 (ref 5–15)
BUN: 19 mg/dL (ref 6–20)
CO2: 29 mmol/L (ref 22–32)
Calcium: 9.1 mg/dL (ref 8.9–10.3)
Chloride: 102 mmol/L (ref 101–111)
Creatinine, Ser: 0.96 mg/dL (ref 0.61–1.24)
GFR calc Af Amer: >60 mL/min (ref >60)
GFR calc non Af Amer: >60 mL/min (ref >60)
Glucose, Bld: 115 mg/dL — ABNORMAL HIGH (ref 65–99)
Potassium: 4 mmol/L (ref 3.5–5.1)
Sodium: 138 mmol/L (ref 135–145)
Total Bilirubin: 0.7 mg/dL (ref 0.3–1.2)
Total Protein: 7.4 g/dL (ref 6.5–8.1)

## 2017-03-02 LAB — CBC WITH DIFFERENTIAL/PLATELET
Basophils Absolute: 0 10*3/uL (ref 0–0.1)
Basophils Relative: 1 %
Eosinophils Absolute: 0.2 10*3/uL (ref 0–0.7)
Eosinophils Relative: 6 %
HCT: 39.1 % — ABNORMAL LOW (ref 40.0–52.0)
Hemoglobin: 13.1 g/dL (ref 13.0–18.0)
Lymphocytes Relative: 15 %
Lymphs Abs: 0.5 10*3/uL — ABNORMAL LOW (ref 1.0–3.6)
MCH: 28.8 pg (ref 26.0–34.0)
MCHC: 33.6 g/dL (ref 32.0–36.0)
MCV: 85.8 fL (ref 80.0–100.0)
Monocytes Absolute: 0.5 10*3/uL (ref 0.2–1.0)
Monocytes Relative: 16 %
Neutro Abs: 2.1 10*3/uL (ref 1.4–6.5)
Neutrophils Relative %: 62 %
Platelets: 136 10*3/uL — ABNORMAL LOW (ref 150–440)
RBC: 4.56 MIL/uL (ref 4.40–5.90)
RDW: 24.8 % — ABNORMAL HIGH (ref 11.5–14.5)
WBC: 3.3 10*3/uL — ABNORMAL LOW (ref 3.8–10.6)

## 2017-03-02 LAB — MAGNESIUM: Magnesium: 2 mg/dL (ref 1.7–2.4)

## 2017-03-02 LAB — PHOSPHORUS: Phosphorus: 2.3 mg/dL — ABNORMAL LOW (ref 2.5–4.6)

## 2017-03-02 MED ORDER — CABOZANTINIB S-MALATE 40 MG PO TABS: 40.0000 mg | ORAL_TABLET | Freq: Every day | ORAL | 0 refills | Status: DC

## 2017-03-02 MED ORDER — CABOZANTINIB S-MALATE 3 X 20 MG PO KIT: 40.0000 mg | PACK | Freq: Every day | ORAL | 0 refills | Status: DC

## 2017-03-02 NOTE — Progress Notes (Signed)
Patient here for follow up. He states that since stopping his chemo he "feels great". Increase in morphine has relieved his pain.

## 2017-03-02 NOTE — Progress Notes (Signed)
[facility] day:  03/02/2017   Chief Complaint: Marc Blake is a 61 y.o. male with metastatic renal cell carcinoma who is seen for 1 month assessment.  HPI: The patient was last seen in the medical oncology clinic on 02/02/2017.  At that time, he was doing well on cabozantinib.  Diarrhea had resolved. He had gained 4 pounds since his last clinic visit.  Hematocrit was 42.4 and hemoglobin 14.1. Renal function was stable.  Liver function tests were normal.  We discussed managing his pain medications.  Labs on 02/16/2017 revealed an AST 135, ALT 127, alkaline phosphatase 344, and bilirubin 1.0.  He was instructed to stop cabozantinib.  Labs on 02/20/2017 revealed positive hepatitis B core antibody and negative hepatitis B surface antigen and hepatitis C antibody. Patient has been on Tenofovir daily since 08/2016.  Symptomatically, he feels "really good".  He has not taken his Cabozantinib in 2 weeks. Patient feeling "so much better" off the medication. His mobility is better. He has had no nausea or vomiting. Pain is well controlled.  He denies pain in the office today.   He is taking MSContin 30 mg BID and oxycodone 5 mg po q 6 hrs prn pain.   Past Medical History:  Diagnosis Date  . Acid reflux   . Anxiety   . Arthritis   . CHF (congestive heart failure) ([place])   . Depression   . Depression   . Hyperlipidemia   . Hypertension   . Myocardial infarction ([place])   . Renal cancer ([place])   . Renal cell carcinoma ([place])   . Renal insufficiency   . Skin cancer     Past Surgical History:  Procedure Laterality Date  . BACK SURGERY    . CARDIAC CATHETERIZATION     with stent  . CYSTOSCOPY WITH BIOPSY  03/06/2016   Procedure: CYSTOSCOPY WITH BIOPSY;  Surgeon: [name], MD;  Location: [facility] ORS;  Service: Urology;;  . [name] WITH URETHRAL DILATATION N/A 03/06/2016   Procedure: CYSTOSCOPY WITH URETHRAL DILATATION WITH CATHETER PLACEMENT;   Surgeon: [name], MD;  Location: [facility] ORS;  Service: Urology;  Laterality: N/A;  . KNEE SURGERY Left   . LEG SURGERY Left   . TONSILLECTOMY      Family History  Problem Relation Age of Onset  . Hypertension Father   . Heart attack Father   . Stroke Sister   . Hypertension Brother   . Stroke Maternal Uncle     Social History:  reports that he has never smoked. He has never used smokeless tobacco. He reports that he does not drink alcohol or use drugs.  He is originally from [place].  He then lived in [place] for 17 years.  He moved to [place] with his wife.  He lost his job of 35 years in early 09/2016.  He is accompanied by his wife, Horris Latino, today.  Allergies: No Known Allergies  Current Medications: Current Outpatient Prescriptions  Medication Sig Dispense Refill  . amLODipine (NORVASC) 10 MG tablet Take 10 mg by mouth daily.  3  . atorvastatin (LIPITOR) 20 MG tablet TAKE 1 TABLET ONCE A DAY (AT BEDTIME) FOR 30 DAYS  2  . busPIRone (BUSPAR) 7.5 MG tablet     . carvedilol (COREG) 12.5 MG tablet TAKE 1 TABLET BY MOUTH TWICE A DAY FOR 30 DAYS  3  . [employer] GENTLE LAXATIVE 10 MG suppository INSERT 1 SUPPOSITORY RECTALLY EVERY 24HRS AS NEEDED FOR CONSTIPATION  0  . [employer] STOOL SOFTENER 8.6-50 MG tablet TAKE 1 TABLET BY MOUTH TWICE A DAY AS NEEDED CONSTIPATION  0  . fluocinonide cream (LIDEX) 0.05 %     . gabapentin (NEURONTIN) 300 MG capsule Take 300 mg by mouth 3 (three) times daily.     Marland Kitchen ibuprofen (ADVIL,MOTRIN) 200 MG tablet Take 200 mg by mouth every 8 (eight) hours as needed for moderate pain. 4 every 8 hours    . ipratropium-albuterol (DUONEB) 0.5-2.5 (3) MG/3ML SOLN     . lisinopril (PRINIVIL,ZESTRIL) 10 MG tablet     . LORazepam (ATIVAN) 0.5 MG tablet Take 1 tablet (0.5 mg total) by mouth every 8 (eight) hours. 45 tablet 0  . morphine (MS CONTIN) 30 MG 12 hr tablet Take 1 tablet (30 mg total) by mouth every 12 (twelve) hours. 60 tablet 0  . oxyCODONE (OXY  IR/ROXICODONE) 5 MG immediate release tablet Take 1 tablet (5 mg total) by mouth every 6 (six) hours as needed (every 6 h prn). 90 tablet 0  . potassium chloride SA (K-DUR,KLOR-CON) 20 MEQ tablet Take 1 tablet (20 mEq total) by mouth daily. For 3 days 30 tablet 0  . senna (SENOKOT) 8.6 MG tablet Take 2 tablets by mouth at bedtime.     . sertraline (ZOLOFT) 100 MG tablet Take 100 mg by mouth at bedtime.  1  . tenofovir (VIREAD) 300 MG tablet Take 1 tablet (300 mg total) by mouth daily. 30 tablet 11  . traZODone (DESYREL) 50 MG tablet Take 50 mg by mouth at bedtime.   3  . triamcinolone cream (KENALOG) 0.5 % Apply topically 2 (two) times daily. 30 g 1  . XARELTO 20 MG TABS tablet Take 20 mg by mouth daily.  1   Current Facility-Administered Medications  Medication Dose Route Frequency Provider Last Rate Last Dose  . 0.9 %  sodium chloride infusion   Intravenous Once [name], MD        Review of Systems:  GENERAL:  Feels good.  No fevers or sweats.  Weight up 1 pound. PERFORMANCE STATUS (ECOG):  2-3 HEENT:  No visual changes, runny nose, sore throat, mouth sores or tenderness.  Teeth sensitivity to hot and cold. Lungs:  No shortness of breath.  No cough or wheezing.  No hemoptysis. Cardiac:  No chest pain, palpitations, orthopnea, or PND. GI:  No nausea, vomiting, diarrhea, constipation, melena or hematochezia. GU:  No urgency, frequency, dysuria, or hematuria. Self caths 3-4 x/week. Musculoskeletal:  Back and shoulder pain, improved.  No muscle tenderness. Extremities:  Chronic left lower extremity swelling. Skin:  Eczema on elbows and back. Neuro:  Poor memory.  No focal numbness.  Decreased movement in left arm (improved).  No headache.  Decreased feeling left leg (chronic). Endocrine:  No diabetes, thyroid issues, hot flashes or night sweats. Psych:  Struggling with not working.  Anxiety. Pain:  No pain on current regimen. Review of systems:  All other systems reviewed and  found to be negative.  Physical Exam: Blood pressure 132/80, pulse (!) 103, temperature 98 F (36.7 C), temperature source Tympanic, height '6\' 3"'$  (1.905 m), weight 215 lb 6.4 oz (97.7 kg). GENERAL:  Slightly fatigued appearing gentleman sitting comfortably in a chair in the exam room in no acute distress.  He has a cane at his side. MENTAL STATUS:  Alert and oriented to person, place and time. HEAD:  Long gray/white hair with goatee.  Normocephalic, atraumatic, face symmetric, no Cushingoid features. EYES:  Pupils equal round and reactive to light and accomodation.  No conjunctivitis or scleral icterus. ENT:  Oropharynx clear without lesion.  Poor dentition.  Tongue normal. Mucous membranes moist.  RESPIRATORY:  Clear to auscultation without rales, wheezes or rhonchi. CARDIOVASCULAR:  Regular rate and rhythm without murmur, rub or gallop.  ABDOMEN:  Soft, non-tender, with active bowel sounds, and no hepatosplenomegaly.  No masses. SKIN:  Tattoos. Eczema on dorsal surface of forearms and lower back, minimal. EXTREMITIES:  No skin discoloration or tenderness.  No palpable cords. LYMPH NODES: No palpable cervical, supraclavicular, axillary or inguinal adenopathy  NEUROLOGICAL:  Appropriate. PSYCH:  Appropriate.  Smiling more.   Imaging studies: 12/28/2013:  Abdominal and pelvic CT scan revealed an 8.5 x 10.2 x 9.2 cm irregularly enhancing mass of the left kidney with some internal calcifications. There were enlarged left sided renal hilar lymph nodes. There were multiple pulmonary nodules in both lower lobes (largest 1.1 cm). There was a destructive bone lesion with an irregular 8.6 x 5.1 cm soft tissue mass invasive of the spinal canal narrowing at least 50% and disturbing the pedicle and portions of the left transverse process rib and T8 vertebral body.  01/15/2014:  Chest CT revealed innumerable pulmonary nodules, a large metastatic lesion at T8 and left eighth rib with invasion into the spinal  canal and moderate canal stenosis. There was a left upper pole right kidney mass.  Head MRI was negative. 05/31/2015:  Chest, abdomen, and pelvic CT revealed slight enlargement of multiple pulmonary nodules.  The left renal mass was similar (12.2 x 9.6 cm).  The right kidney lesion was stable (lower pole lesion 2 x 1.7 cm).  Bone scan on 05/31/2015 revealed slight increased activity in the left costal vertebral junction at T7. 12/14/2015:  Chest, abdomen, and pelvic CT revealed an irregular 10.7 cm heterogeneous renal cortical mass in the upper left kidney and a heterogeneous 2.3 cm renal cortical mass in the lateral lower right kidney.  There was mild bilateral hydroureteronephrosis with bladder distention.  There was mild left para-aortic lymphadenopathy.  There were numerous (greater than 20) pulmonary metastases.  Largest nodule was 2.1 cm in the RLL. There was a partially visualized mildly expansile lytic lesion in the left proximal femur status post surgical transfixation.  Thre was a small probably loculated pleural effusion at the left eighth rib resection site.  There were no other bone abnormalities. 12/24/2015:  Bone scan revealed photopenia from prior removal of the left eighth rib. There was increased  uptake in the medial thoracic spine from T7 and T9 is felt to be due to postoperative fusion.  There was increased uptake throughout much of the left femur as well as increased uptake in the soft tissues of much of the left femur which may be due to previous surgery and radiation therapy change. 03/10/2016:  Chest CT revealed unchanged bilateral pulmonary nodules and a left renal mass. 08/08/2016:  Chest, abdomen, and pelvic CT revealed decreasing left kidney lesion, stable to increase in multifocal pulmonary nodules and a similar appearance of prominent upper abdominal lymph nodes that were worrisome for metastatic adenopathy. 10/20/2016:  Chest, abdomen, and pelvic CT revealed progressive disease  with enlarging left renal mass and pulmonary nodules.  Stable lytic lesion in posterior elements of T8.   10/23/2016:  Bone scan revealed activity identified in the region of prior thoracic spine surgery. Local progression of concern.  There was uptake in the posterior left ninth rib was new since 12/23/2015 corresponds to  lytic lesion on recent CT. This is compatible with metastatic disease.  There was uptake in the distal left femur concerning for metastatic involvement.  There was anterior left seventh rib activity also concerning for metastatic deposit. 10/27/2016:  MRI of the cervical spine revealed C4 body fracture with up to 75% height loss, likely pathologic.  There was no visible epidural or foraminal infiltration, but post-contrast imaging would be more sensitive.  On the symptomatic left side there is C4-5 and C5-6 left foraminal narrowing.  On the right, there is degenerative C3-4 and C6-7 foraminal impingement.  There was diffusely patent spinal canal. 10/28/2016: [facility] at [facility] revealed C4 pathologic fracture with minimal retropulsion and moderate canal stenosis. New enhancing soft tissue lesion in posterior elements of T8 causing anterior displacement with severe stenosis without cord signal abnormality.   Appointment on 03/02/2017  Component Date Value Ref Range Status  . Sodium 03/02/2017 138  135 - 145 mmol/L Final  . Potassium 03/02/2017 4.0  3.5 - 5.1 mmol/L Final  . Chloride 03/02/2017 102  101 - 111 mmol/L Final  . CO2 03/02/2017 29  22 - 32 mmol/L Final  . Glucose, Bld 03/02/2017 115* 65 - 99 mg/dL Final  . BUN 27/51/7001 19  6 - 20 mg/dL Final  . Creatinine, Ser 03/02/2017 0.96  0.61 - 1.24 mg/dL Final  . Calcium 74/94/4967 9.1  8.9 - 10.3 mg/dL Final  . Total Protein 03/02/2017 7.4  6.5 - 8.1 g/dL Final  . Albumin 59/16/3846 3.4* 3.5 - 5.0 g/dL Final  . AST 65/99/3570 32  15 - 41 U/L Final  . ALT 03/02/2017 23  17 - 63 U/L Final  . Alkaline Phosphatase 03/02/2017 127* 38 -  126 U/L Final  . Total Bilirubin 03/02/2017 0.7  0.3 - 1.2 mg/dL Final  . GFR calc non Af Amer 03/02/2017 >60  >60 mL/min Final  . GFR calc Af Amer 03/02/2017 >60  >60 mL/min Final   Comment: (NOTE) The eGFR has been calculated using the CKD EPI equation. This calculation has not been validated in all clinical situations. eGFR's persistently <60 mL/min signify possible Chronic Kidney Disease.   . Anion gap 03/02/2017 7  5 - 15 Final  . WBC 03/02/2017 3.3* 3.8 - 10.6 K/uL Final  . RBC 03/02/2017 4.56  4.40 - 5.90 MIL/uL Final  . Hemoglobin 03/02/2017 13.1  13.0 - 18.0 g/dL Final  . HCT 17/79/3903 39.1* 40.0 - 52.0 % Final  . MCV 03/02/2017 85.8  80.0 - 100.0 fL Final  . MCH 03/02/2017 28.8  26.0 - 34.0 pg Final  . MCHC 03/02/2017 33.6  32.0 - 36.0 g/dL Final  . RDW 00/92/3300 24.8* 11.5 - 14.5 % Final  . Platelets 03/02/2017 136* 150 - 440 K/uL Final  . Neutrophils Relative % 03/02/2017 62  % Final  . Neutro Abs 03/02/2017 2.1  1.4 - 6.5 K/uL Final  . Lymphocytes Relative 03/02/2017 15  % Final  . Lymphs Abs 03/02/2017 0.5* 1.0 - 3.6 K/uL Final  . Monocytes Relative 03/02/2017 16  % Final  . Monocytes Absolute 03/02/2017 0.5  0.2 - 1.0 K/uL Final  . Eosinophils Relative 03/02/2017 6  % Final  . Eosinophils Absolute 03/02/2017 0.2  0 - 0.7 K/uL Final  . Basophils Relative 03/02/2017 1  % Final  . Basophils Absolute 03/02/2017 0.0  0 - 0.1 K/uL Final  . Magnesium 03/02/2017 2.0  1.7 - 2.4 mg/dL Final    Assessment:  Marc Blake is a  61 y.o. male with metastatic renal cell carcinoma presenting in 12/2013.  He had a large left renal mass, rib and vertebral body involvement, and multiple pulmonary nodules.  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    He was on Votrient (pazopanib) from 02/13/2014 - 01/21/2016.  He began nivolumab on 01/28/2016.  He received 3000 cGy to T7 - T9 and associated ribs beginning 05/11/2014.  On  06/15/2014, he received 1 dose of nivolumab.  He presented with progressive paralysis from the waist down on 07/06/2014.  He underwent left posterolateral thoracotomy with left chest wall resection with T8 corpectomy and interbody fusion with donor bone on 07/06/2014.  Pathology revealed metastatic renal cell carcinoma. Post-operatively, he developed sepsis secondary to a submandibular abscess dental abscess.  He underwent tracheostomy, I&D of a right neck abscess, tooth extraction, and PEG tube placement.  Zometa has subsequently been on hold.  He developed a pathologic fracture of the left femur.  He is s/p intramedullary nailing on 01/27/2015.  He received radiation to the left leg.  He was admitted on 04/26/2015 with aspiration pneumonia and opioid overdose. He is currently taking MSContin 15 mg BID and oxycodone 5 mg q day.  He developed a left lower extremity DVT (popliteal and soleal vein) on 06/07/2015.  Left lower extremity ultrasound on 02/07/2016 revealed no evidence of DVT.  He is on long term Xarelto.  Patient has a long standing history of an obstructive uropathy and elevated PVR.  He has undergone urethral dilatation on several occasions.  He underwent cystoscopy, balloon urethral dilatation, and bladder biopsy with Foley catheter placement on 03/06/2016.  Bladder biopsy revealed cystitis with reactive changes, negative for atypia and malignancy.  He was admitted to [facility] from 02/14/2016 - 02/16/2016 with chest pain.  Chest CT angiogram on 02/15/2016 ruled out pulmonary embolism. Imaging revealed slight progression of mediastinal left hilar adenopathy was pulmonary metastatic disease. Stress test on 02/15/2016 ruled out cardiac ischemia.  He was treated with Augmentin for presumed lower extremity cellulitis.  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  There is possible increase in change in the lytic lesion with some movement at the fracture site.  He has broken the distal  locking screw.  He ambulates with a rolling walker.  Bone scan at [facility] on 06/22/2016 revealed no definite evidence of osseous metastatic disease.  There were photopenic regions overlying the left posterior eighth rib and the proximal left femur, likely representing prior surgical resection  He underwent removal of prior rod/intramedullary nail, radical resection of left femur, resection of tumor, and left hemiarthroplasty at [facility] on 06/16/2016.  Pathology of the left proximal femur revealed metastatic carcinoma compatible with renal cell carcinoma.  He is scheduled to begin physical therapy.   He received 13 cycles of nivolumab (01/28/2016 - 06/06/2016; 08/08/2016 - 10/03/2016).  He tolerated his infusions well.  TSH was 6.239 (0.35-4.50) with a normal free T4 (0.79) on 02/25/2016.  TSH was 2.713 (normal) and free T4 0.79 (normal) on 05/19/2016.  Treatment was held on 05/19/2016 secondary to diarrhea and on 05/26/2016 secondary to unexplained hypotension.   Cortisol level was 7.9 with a repeat of 10.3 (normal) on 05/29/2016 at 8:26 AM.  ACTH was 39.1 (normal) at 10 AM on 05/26/2016.  Chest, abdomen, and pelvic CT on 10/20/2016 revealed progressive disease with enlarging left renal mass and pulmonary nodules.  Stable lytic lesion in posterior elements of T8.  Bone scan  on 10/23/2016 revealed activity identified in the region of prior thoracic spine surgery. Local progression of concern.  There was uptake in the posterior left ninth rib is new since 12/23/2015 corresponds to lytic lesion on recent CT. This is compatible with metastatic disease.  There was uptake in the distal left femur concerning for metastatic involvement.  There was anterior left seventh rib activity also concerning for metastatic deposit.  MRI of the cervical spine on 10/27/2016 revealed C4 body fracture with up to 75% height loss, likely pathologic.  There was no visible epidural or foraminal infiltration, but post-contrast imaging would  be more sensitive.  On the symptomatic left side there is C4-5 and C5-6 left foraminal narrowing.  On the right, there is degenerative C3-4 and C6-7 foraminal impingement.  There was diffusely patent spinal canal.  He underwent  C4 corpectomy with C3-5 plating, T6-T10 PSF with tumor decompression, and T8 laminectomy on 10/30/2016.  Pathology of the T8 lesion and C4 lesion confirmed renal cell carcinoma.  He received radiation from 12/05/2016 - 12/21/2016.   He began cabozantinib (Cometriq) in 11/2016.  Treatment was held during radiation.  He restarted cabozantinib on 12/28/2016.  He developed progressive microcytic anemia c/w iron deficiency and anemia of chronic disease.  After his surgery at [facility] in 05/2016, he required 2 units of PRBCs.  MCV was 74.  Anemia work-up on 07/31/2016 revealed a ferritin 318 (falsely elevated secondary to ESR > 140), iron saturation 12%, TIBC 217 (low), and retic 2.5%.  He has received Venofer x 3 (08/22/2016, 09/05/2016, 09/19/2016, 11/10/2016, and 11/17/2016) with improvement in his hematocrit.  He has restrictive lung disease secondary to his weight and prior radiation.  He has been prescribed oxygen (not received).    His psoriasis flared on nivolumab, but has improved with topical steroids.  Psoriasis was back to baseline after 2 months off nivolumab.  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  He is hepatitis B positive.  He is on chronic tenofovir prophylaxis.  Symptomatically, he is feeling "so much better".  Pain is well controlled.  He has been off cabozantinib x 2 weeks.  Hematocrit is 39.1 and hemoglobin 13.1. Renal function is stable. LFTs have returned to baseline.   Plan: 1.  Labs today:  CBC with diff, CMP, Mg, Phos. 2.  Discuss reduced dose cabozantinib.  Patient agreeing to try medication at lower dose (40 mg) rather than rechallenge at prior dose (60 mg). 3.  Rx: cabozantinib 22m daily. 4.  Discuss pain management. Pain is  controlled. He is currently taking MS Contin 373mBID and oxycodone 5 mg every 6 hours PRN for breakthrough pain.  5.  Discuss hepatitis B testing.  Patient on tenofovir. 6.  RTC in 2 weeks for labs (CMP). 7.  RTC in 4 weeks for MD assessment and labs (CBC with diff, CMP, Mg, Phos).    BrHonor LohNP 03/02/2017, 11:58 AM   I saw and evaluated the patient, participating in the key portions of the service and reviewing pertinent diagnostic studies and records.  I reviewed the nurse practitioner's note and agree with the findings and the plan.  The assessment and plan were discussed with the patient.     MeLequita AsalMD 03/02/2017,2:29 PM

## 2017-03-02 NOTE — Telephone Encounter (Addendum)
Oral Oncology Pharmacist Encounter  Received refill prescription for cabozantinib (Cabometyx) for the treatment of metastatic renal cell carcinoma in, planned duration until disease progression or unacceptable drug toxicity. Medication started 11/2016. Medication was held d/t elevation in LFTs and is now being dose reduced and restarted.  CBC/ CMP from 03/02/17 assessed, no relevant lab abnormalities. Prescription dose and frequency assessed.   Current medication list in Epic reviewed, no relevant DDIs with cabozantinib identified.  Prescription has been e-scribed to the [facility] and we will see if the insurance company will let it be filled there.  Oral Oncology Clinic will continue to follow patient.   Darl Pikes, PharmD, BCPS Hematology/Oncology Clinical Pharmacist [facility] [place] [facility] [phone]  03/02/2017 2:50 PM

## 2017-03-02 NOTE — Telephone Encounter (Signed)
Oral Chemotherapy Pharmacist Encounter   Attempted to reach patient to provide refill Rx education on oral medication: Cabozantinib (Cabometyx). No answer. Left VM for patient to call back.  Darl Pikes, PharmD, BCPS Hematology/Oncology Clinical Pharmacist [facility] [place] [facility] [phone]  03/05/2017 11:10 AM

## 2017-03-02 NOTE — Telephone Encounter (Signed)
Oral Chemotherapy Pharmacist Encounter   Attempted to reach patient to provide refill Rx education on oral medication: Cabozantinib (Cabometyx). No answer. Left VM for patient to call back.  Darl Pikes, PharmD, BCPS Hematology/Oncology Clinical Pharmacist [facility] [place] [facility] [phone]  03/02/2017 3:40 PM

## 2017-03-05 ENCOUNTER — Ambulatory Visit: Payer: Medicaid Other

## 2017-03-06 MED FILL — CABOMETYX 40 MG TABLET: 40 | 30 days supply | Qty: 30 | Fill #0

## 2017-03-06 NOTE — Telephone Encounter (Signed)
Oral Oncology Patient Advocate Encounter  Spoke with patient about mailing out his medication and his $3.00 copay. He paid with credit card. I told patient to be expecting a call from the pharmacy. We will be mailing his Cabometyx to his home.   He will be contacted monthly from pharmacy for his refill to go out.  [place] Patient Advocate [phone] 03/06/2017 3:55 PM

## 2017-03-06 NOTE — Telephone Encounter (Signed)
Oral Chemotherapy Pharmacist Encounter   Attempted third call patient to provide refill Rx education on oral medication: Cabozantinib (Cabometyx). I have to speak with patient before medication can be dispensed to him. No answer. Left VM for patient to call back. Called both his home and his wife's phone number.   Darl Pikes, PharmD, BCPS Hematology/Oncology Clinical Pharmacist [facility] [place] [facility] [phone]  03/06/2017 1:47 PM

## 2017-03-06 NOTE — Telephone Encounter (Signed)
Oral Chemotherapy Pharmacist Encounter  I spoke with patient for overview of refill prescription for cabozantinib for the treatment of metastatic renal cell carcinoma in planned duration of until disease progression or unacceptable drug toxicity. Medication started 11/2016. Medication was held d/t elevation in LFTs and is now being dose reduced and restarted.   Pt was previously filling at an outside pharmacy but the medication is now being filled at [facility] with patient administration, dosing, side effects, safe handling, and monitoring. Patient will take 40 mg by mouth daily. Take on empty stomach.   Side effects include but not limited to: fatigue, hand-foot syndrome, N/V/D, HTN> Patient reported he had fatigue at the 60mg  dose but he is hopefully the 40mg  will be more tolerable.    Reviewed with patient importance of keeping a medication schedule and plan for any missed doses.   Mr. Wahlert voiced understanding and appreciation. All questions answered.   Patient knows to call the office with questions or concerns. Oral Oncology Clinic will continue to follow.   Thank you,  Darl Pikes, PharmD, BCPS Hematology/Oncology Clinical Pharmacist [facility] [place] [facility] [phone]

## 2017-03-07 ENCOUNTER — Ambulatory Visit: Payer: Medicaid Other

## 2017-03-09 ENCOUNTER — Inpatient Hospital Stay
Admission: EM | Admit: 2017-03-09 | Discharge: 2017-03-19 | DRG: 870 | Disposition: E | Payer: Medicaid Other | Attending: Internal Medicine | Admitting: Internal Medicine

## 2017-03-09 ENCOUNTER — Emergency Department: Payer: Medicaid Other

## 2017-03-09 DIAGNOSIS — F419 Anxiety disorder, unspecified: Secondary | ICD-10-CM | POA: Diagnosis present

## 2017-03-09 DIAGNOSIS — G92 Toxic encephalopathy: Secondary | ICD-10-CM | POA: Diagnosis not present

## 2017-03-09 DIAGNOSIS — G8929 Other chronic pain: Secondary | ICD-10-CM | POA: Diagnosis present

## 2017-03-09 DIAGNOSIS — Z7901 Long term (current) use of anticoagulants: Secondary | ICD-10-CM

## 2017-03-09 DIAGNOSIS — J15211 Pneumonia due to Methicillin susceptible Staphylococcus aureus: Secondary | ICD-10-CM | POA: Diagnosis present

## 2017-03-09 DIAGNOSIS — E870 Hyperosmolality and hypernatremia: Secondary | ICD-10-CM | POA: Diagnosis not present

## 2017-03-09 DIAGNOSIS — R6520 Severe sepsis without septic shock: Secondary | ICD-10-CM | POA: Diagnosis present

## 2017-03-09 DIAGNOSIS — E782 Mixed hyperlipidemia: Secondary | ICD-10-CM | POA: Diagnosis present

## 2017-03-09 DIAGNOSIS — Z515 Encounter for palliative care: Secondary | ICD-10-CM

## 2017-03-09 DIAGNOSIS — Z8249 Family history of ischemic heart disease and other diseases of the circulatory system: Secondary | ICD-10-CM

## 2017-03-09 DIAGNOSIS — J9601 Acute respiratory failure with hypoxia: Secondary | ICD-10-CM | POA: Diagnosis present

## 2017-03-09 DIAGNOSIS — I10 Essential (primary) hypertension: Secondary | ICD-10-CM | POA: Diagnosis present

## 2017-03-09 DIAGNOSIS — A4101 Sepsis due to Methicillin susceptible Staphylococcus aureus: Principal | ICD-10-CM | POA: Diagnosis present

## 2017-03-09 DIAGNOSIS — Z823 Family history of stroke: Secondary | ICD-10-CM

## 2017-03-09 DIAGNOSIS — Z66 Do not resuscitate: Secondary | ICD-10-CM | POA: Diagnosis not present

## 2017-03-09 DIAGNOSIS — C642 Malignant neoplasm of left kidney, except renal pelvis: Secondary | ICD-10-CM | POA: Diagnosis present

## 2017-03-09 DIAGNOSIS — J9602 Acute respiratory failure with hypercapnia: Secondary | ICD-10-CM | POA: Diagnosis present

## 2017-03-09 DIAGNOSIS — R6521 Severe sepsis with septic shock: Secondary | ICD-10-CM | POA: Diagnosis present

## 2017-03-09 DIAGNOSIS — R7989 Other specified abnormal findings of blood chemistry: Secondary | ICD-10-CM | POA: Diagnosis present

## 2017-03-09 DIAGNOSIS — I2510 Atherosclerotic heart disease of native coronary artery without angina pectoris: Secondary | ICD-10-CM | POA: Diagnosis present

## 2017-03-09 DIAGNOSIS — K219 Gastro-esophageal reflux disease without esophagitis: Secondary | ICD-10-CM | POA: Diagnosis present

## 2017-03-09 DIAGNOSIS — A419 Sepsis, unspecified organism: Secondary | ICD-10-CM | POA: Diagnosis present

## 2017-03-09 DIAGNOSIS — I252 Old myocardial infarction: Secondary | ICD-10-CM

## 2017-03-09 DIAGNOSIS — Z01818 Encounter for other preprocedural examination: Secondary | ICD-10-CM

## 2017-03-09 DIAGNOSIS — N179 Acute kidney failure, unspecified: Secondary | ICD-10-CM | POA: Diagnosis present

## 2017-03-09 DIAGNOSIS — R0600 Dyspnea, unspecified: Secondary | ICD-10-CM

## 2017-03-09 DIAGNOSIS — B1910 Unspecified viral hepatitis B without hepatic coma: Secondary | ICD-10-CM | POA: Diagnosis present

## 2017-03-09 DIAGNOSIS — Y95 Nosocomial condition: Secondary | ICD-10-CM | POA: Diagnosis present

## 2017-03-09 DIAGNOSIS — E876 Hypokalemia: Secondary | ICD-10-CM | POA: Diagnosis not present

## 2017-03-09 DIAGNOSIS — F329 Major depressive disorder, single episode, unspecified: Secondary | ICD-10-CM | POA: Diagnosis present

## 2017-03-09 DIAGNOSIS — Z85828 Personal history of other malignant neoplasm of skin: Secondary | ICD-10-CM

## 2017-03-09 DIAGNOSIS — L8990 Pressure ulcer of unspecified site, unspecified stage: Secondary | ICD-10-CM | POA: Insufficient documentation

## 2017-03-09 DIAGNOSIS — Z4659 Encounter for fitting and adjustment of other gastrointestinal appliance and device: Secondary | ICD-10-CM

## 2017-03-09 DIAGNOSIS — I248 Other forms of acute ischemic heart disease: Secondary | ICD-10-CM

## 2017-03-09 DIAGNOSIS — Z923 Personal history of irradiation: Secondary | ICD-10-CM

## 2017-03-09 DIAGNOSIS — Z79891 Long term (current) use of opiate analgesic: Secondary | ICD-10-CM

## 2017-03-09 DIAGNOSIS — M1990 Unspecified osteoarthritis, unspecified site: Secondary | ICD-10-CM | POA: Diagnosis present

## 2017-03-09 DIAGNOSIS — E785 Hyperlipidemia, unspecified: Secondary | ICD-10-CM | POA: Diagnosis present

## 2017-03-09 DIAGNOSIS — C7951 Secondary malignant neoplasm of bone: Secondary | ICD-10-CM | POA: Diagnosis present

## 2017-03-09 DIAGNOSIS — C7800 Secondary malignant neoplasm of unspecified lung: Secondary | ICD-10-CM | POA: Diagnosis present

## 2017-03-09 DIAGNOSIS — N170 Acute kidney failure with tubular necrosis: Secondary | ICD-10-CM | POA: Diagnosis present

## 2017-03-09 DIAGNOSIS — C649 Malignant neoplasm of unspecified kidney, except renal pelvis: Secondary | ICD-10-CM | POA: Diagnosis present

## 2017-03-09 DIAGNOSIS — D649 Anemia, unspecified: Secondary | ICD-10-CM | POA: Diagnosis present

## 2017-03-09 DIAGNOSIS — E872 Acidosis: Secondary | ICD-10-CM | POA: Diagnosis present

## 2017-03-09 DIAGNOSIS — J189 Pneumonia, unspecified organism: Secondary | ICD-10-CM | POA: Diagnosis present

## 2017-03-09 DIAGNOSIS — Z9221 Personal history of antineoplastic chemotherapy: Secondary | ICD-10-CM

## 2017-03-09 DIAGNOSIS — N390 Urinary tract infection, site not specified: Secondary | ICD-10-CM | POA: Diagnosis present

## 2017-03-09 DIAGNOSIS — J9600 Acute respiratory failure, unspecified whether with hypoxia or hypercapnia: Secondary | ICD-10-CM

## 2017-03-09 DIAGNOSIS — R778 Other specified abnormalities of plasma proteins: Secondary | ICD-10-CM | POA: Diagnosis present

## 2017-03-09 DIAGNOSIS — Z452 Encounter for adjustment and management of vascular access device: Secondary | ICD-10-CM

## 2017-03-09 LAB — ETHANOL: Alcohol, Ethyl (B): <5 mg/dL (ref <5)

## 2017-03-09 MED ORDER — VANCOMYCIN HCL IN DEXTROSE 1-5 GM/200ML-% IV SOLN
1000.0000 mg | Freq: Once | INTRAVENOUS | Status: AC
  Administered 2017-03-09: 1000 mg via INTRAVENOUS
  Filled 2017-03-09: qty 200

## 2017-03-09 MED ORDER — SODIUM CHLORIDE 0.9 % IV BOLUS (SEPSIS)
2250.0000 mL | Freq: Once | INTRAVENOUS | Status: AC
  Administered 2017-03-09: 2250 mL via INTRAVENOUS

## 2017-03-09 MED ORDER — PIPERACILLIN-TAZOBACTAM 3.375 G IVPB 30 MIN
3.3750 g | Freq: Once | INTRAVENOUS | Status: AC
  Administered 2017-03-09: 3.375 g via INTRAVENOUS
  Filled 2017-03-09: qty 50

## 2017-03-09 NOTE — ED Provider Notes (Signed)
[facility] Emergency Department Provider Note  ____________________________________________   First MD Initiated Contact with Patient 03/08/2017 2316     (approximate)  I have reviewed the triage vital signs and the nursing notes.   HISTORY  Chief Complaint Code Sepsis  Level 5 caveat:  history/ROS limited by acute/critical illness  HPI Marc Blake is a 61 y.o. male with a history of extensive metastatic renal cell carcinoma on chemotherapy until about 1 month ago who is managed by [name].  he presents by EMS as emergency traffic due to altered mental status and septic appearance.  His wife reported to EMS that he has not been up and moving around for 2 days.  She could not wake him up tonight.  When they arrived he was hypotensive at about 60/40, tachycardic in the 140s, and response to his voice but is clearly altered and confused.  He had an SPO2 of 85% on room air.  He received 750 mL of normal saline in route and his blood pressure almost normalized at about 488 systolic.  He is toxic appearing upon arrival and is moaning, but able to focus on me when I ask him questions and answer simple questions.  He has mild tachypnea but is oxygenating at 100% on nonrebreather.   Past Medical History:  Diagnosis Date  . Acid reflux   . Anxiety   . Arthritis   . CHF (congestive heart failure) ([place])   . Depression   . Depression   . Hyperlipidemia   . Hypertension   . Myocardial infarction ([place])   . Renal cancer ([place])   . Renal cell carcinoma ([place])   . Renal insufficiency   . Skin cancer     Patient Active Problem List   Diagnosis Date Noted  . Severe sepsis ([place]) 03/10/2017  . HCAP (healthcare-associated pneumonia) 03/10/2017  . AKI (acute kidney injury) ([place]) 03/10/2017  . Acute respiratory failure with hypoxia and hypercapnia ([place]) 03/10/2017  . Cancer related pain 03/02/2017  . Anxiety 11/10/2016  . Encounter for antineoplastic  immunotherapy 10/23/2016  . Goals of care, counseling/discussion 10/23/2016  . Anemia, iron deficiency 09/06/2016  . Vaccine counseling 09/06/2016  . Arthralgia 08/15/2016  . Arthritis 08/01/2016  . Anemia 08/01/2016  . S/P radiation > 12 weeks 06/16/2016  . Elevated TSH 03/10/2016  . Mild eczema 02/27/2016  . Dyspnea on exertion 02/14/2016  . Elevated troponin 02/14/2016  . Fracture of right lower extremity 02/03/2016  . H/O resection of rib 02/03/2016  . Enlarged prostate without urinary obstruction 02/01/2016  . Urethral stricture 02/01/2016  . Personal history of deep vein thrombosis 02/01/2016  . CAD (coronary artery disease) 01/19/2016  . Mixed hyperlipidemia 01/19/2016  . Shortness of breath 01/19/2016  . Hydronephrosis 01/10/2016  . Incomplete bladder emptying 01/10/2016  . Chronic pain due to neoplasm 12/13/2015  . Essential hypertension 12/13/2015  . Major depressive disorder with single episode, in partial remission ([place]) 12/13/2015  . Deep venous thrombosis of left popliteal vein ([place]) 06/07/2015  . Renal cell carcinoma ([place]) 01/21/2014  . Bone metastasis ([place]) 01/21/2014  . Lung metastases ([place]) 01/21/2014    Past Surgical History:  Procedure Laterality Date  . BACK SURGERY    . CARDIAC CATHETERIZATION     with stent  . CYSTOSCOPY WITH BIOPSY  03/06/2016   Procedure: CYSTOSCOPY WITH BIOPSY;  Surgeon: [name], MD;  Location: [facility] ORS;  Service: Urology;;  . [name] WITH URETHRAL DILATATION N/A 03/06/2016   Procedure: CYSTOSCOPY  WITH URETHRAL DILATATION WITH CATHETER PLACEMENT;  Surgeon: [name], MD;  Location: [facility] ORS;  Service: Urology;  Laterality: N/A;  . KNEE SURGERY Left   . LEG SURGERY Left   . TONSILLECTOMY      Prior to Admission medications   Medication Sig Start Date End Date Taking? Authorizing Provider  amLODipine (NORVASC) 10 MG tablet Take 10 mg by mouth daily. 07/06/16  Yes [provider]  atorvastatin (LIPITOR) 20 MG  tablet TAKE 1 TABLET ONCE A DAY (AT BEDTIME) FOR 30 DAYS 11/26/15  Yes [provider]  busPIRone (BUSPAR) 7.5 MG tablet  07/12/16  Yes [provider]  carvedilol (COREG) 12.5 MG tablet TAKE 1 TABLET BY MOUTH TWICE A DAY FOR 30 DAYS 10/18/15  Yes [provider]  [employer] GENTLE LAXATIVE 10 MG suppository INSERT 1 SUPPOSITORY RECTALLY EVERY 24HRS AS NEEDED FOR CONSTIPATION 07/21/16  Yes [provider]  [employer] STOOL SOFTENER 8.6-50 MG tablet TAKE 1 TABLET BY MOUTH TWICE A DAY AS NEEDED CONSTIPATION 07/21/16  Yes [provider]  fluocinonide cream (LIDEX) 0.05 %  07/21/16  Yes [provider]  gabapentin (NEURONTIN) 300 MG capsule Take 300 mg by mouth 3 (three) times daily.    Yes [provider]  ibuprofen (ADVIL,MOTRIN) 200 MG tablet Take 200 mg by mouth every 8 (eight) hours as needed for moderate pain. 4 every 8 hours   Yes [provider]  ipratropium-albuterol (DUONEB) 0.5-2.5 (3) MG/3ML SOLN  07/21/16  Yes [provider]  lisinopril (PRINIVIL,ZESTRIL) 10 MG tablet  07/21/16  Yes [provider]  LORazepam (ATIVAN) 0.5 MG tablet Take 1 tablet (0.5 mg total) by mouth every 8 (eight) hours. 02/22/17  Yes Corcoran, Drue Second, MD  morphine (MS CONTIN) 30 MG 12 hr tablet Take 1 tablet (30 mg total) by mouth every 12 (twelve) hours. 02/23/17  Yes Corcoran, Drue Second, MD  oxyCODONE (OXY IR/ROXICODONE) 5 MG immediate release tablet Take 1 tablet (5 mg total) by mouth every 6 (six) hours as needed (every 6 h prn). 02/23/17  Yes Corcoran, Drue Second, MD  potassium chloride SA (K-DUR,KLOR-CON) 20 MEQ tablet Take 1 tablet (20 mEq total) by mouth daily. For 3 days 10/17/16  Yes [name], MD  senna (SENOKOT) 8.6 MG tablet Take 2 tablets by mouth at bedtime.    Yes [provider]  sertraline (ZOLOFT) 100 MG tablet Take 100 mg by mouth at bedtime. 11/13/15  Yes [provider]  tenofovir (VIREAD) 300 MG tablet Take 1  tablet (300 mg total) by mouth daily. 09/13/16  Yes Lucilla Lame, MD  traZODone (DESYREL) 50 MG tablet Take 50 mg by mouth at bedtime.   Yes [provider]  triamcinolone cream (KENALOG) 0.5 % Apply topically 2 (two) times daily. 09/19/16 09/19/17 Yes Finnegan, Kathlene November, MD  XARELTO 20 MG TABS tablet Take 20 mg by mouth daily. 11/16/15  Yes [provider]    Allergies Patient has no known allergies.  Family History  Problem Relation Age of Onset  . Hypertension Father   . Heart attack Father   . Stroke Sister   . Hypertension Brother   . Stroke Maternal Uncle     Social History Social History  Substance Use Topics  . Smoking status: Never Smoker  . Smokeless tobacco: Never Used  . 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 --      Excl. in [place]? --     Constitutional: lethargic and somnolent but awakens to light voice and touch and response to noxious stimuli.  Toxic appearing. Eyes: Conjunctivae are normal. PERRL. EOMI. Head: Atraumatic. Nose: No congestion/rhinnorhea. Neck: No stridor.  No meningeal signs.   Cardiovascular: moderate tachycardia, regular rhythm. diminished peripheral circulation. Grossly normal heart sounds. Respiratory: increased respiratory rate but without retractions. Lungs CTAB. Gastrointestinal: Soft with diffuse tenderness throughout abdomen but no  rebound, no guarding Musculoskeletal: No lower extremity tenderness nor edema. No gross deformities of extremities. Neurologic:  patient is moving all 4 extremities and has no facial droop but Anticipate and full neurological exam at this time Skin:  Skin is cool, pale, dry and intact. No rash noted.   ____________________________________________   LABS (all labs ordered are listed, but only abnormal results are displayed)  Labs Reviewed  LACTIC ACID, PLASMA - Abnormal; Notable for the following:       Result Value   Lactic Acid, Venous 5.2 (*)    All other components within normal limits  COMPREHENSIVE METABOLIC PANEL - Abnormal; Notable for the following:    Glucose, Bld 160 (*)    BUN 86 (*)    Creatinine, Ser 3.47 (*)    Total Protein 6.3 (*)    Albumin 2.4 (*)    GFR calc non Af Amer 18 (*)    GFR calc Af Amer 20 (*)    All other components within normal limits  BRAIN NATRIURETIC PEPTIDE - Abnormal; Notable for the following:    B Natriuretic Peptide 957.0 (*)    All other components within normal limits  TROPONIN I - Abnormal; Notable for the following:    Troponin I 0.24 (*)    All other components within normal limits  CBC WITH DIFFERENTIAL/PLATELET - Abnormal; Notable for the following:    RBC 3.93 (*)    Hemoglobin 11.8 (*)    HCT 35.5 (*)    RDW 25.4 (*)    Platelets 116 (*)    Lymphs Abs 0.2 (*)    All other components within normal limits  PROTIME-INR - Abnormal; Notable for the following:    Prothrombin Time 17.0 (*)    All other components within normal limits  BLOOD GAS, ARTERIAL - Abnormal; Notable for the following:    pH, Arterial 7.28 (*)    Bicarbonate 19.3 (*)    Acid-base deficit 7.0 (*)    All other components within normal limits  URINALYSIS, COMPLETE (UACMP) WITH MICROSCOPIC - Abnormal; Notable for the following:    Color, Urine AMBER (*)    APPearance CLOUDY (*)    Hgb urine dipstick MODERATE (*)    Protein, ur 100 (*)  Leukocytes, UA  LARGE (*)    Bacteria, UA FEW (*)    Squamous Epithelial / LPF 0-5 (*)    All other components within normal limits  BLOOD GAS, VENOUS - Abnormal; Notable for the following:    pH, Ven 7.12 (*)    pCO2, Ven 72 (*)    Acid-base deficit 6.5 (*)    All other components within normal limits  CULTURE, BLOOD (ROUTINE X 2)  CULTURE, BLOOD (ROUTINE X 2)  URINE CULTURE  LIPASE, BLOOD  PROCALCITONIN  ETHANOL  LACTIC ACID, PLASMA  HIV ANTIBODY (ROUTINE TESTING)  BASIC METABOLIC PANEL  CBC   ____________________________________________  EKG  ED ECG REPORT I, Donnivan Villena, the attending physician, personally viewed and interpreted this ECG.  Date: 03/05/2017 EKG Time: 23:26 Rate: 139 Rhythm: sinus tachycardia with PVCs QRS Axis: LAD Intervals: normal, LVH ST/T Wave abnormalities: Non-specific ST segment / T-wave changes, but no evidence of acute ischemia. Narrative Interpretation: possible demand ischemia  ____________________________________________  RADIOLOGY   Dg Chest Port 1 View  Result Date: 02/19/2017 CLINICAL DATA:  Respiratory distress. EXAM: PORTABLE CHEST 1 VIEW COMPARISON:  Chest CT 10/20/2016 FINDINGS: Midthoracic fusion and corpectomy Low lung volumes. Lung metastasis not well identified. One nodule noted LEFT lung base. For low lung volumes. No focal infiltrate. Basilar atelectasis on LEFT. IMPRESSION: LEFT basilar atelectasis.  No evidence pneumonia.  Mild congestion. Pulmonary metastasis under appreciated on radiograph Electronically Signed   By: Suzy Bouchard M.D.   On: 03/02/2017 23:35    ____________________________________________   PROCEDURES  Critical Care performed: Yes, see critical care procedure note(s)   Procedure(s) performed:   .Critical Care Performed by: Hinda Kehr Authorized by: Hinda Kehr   Critical care provider statement:    Critical care time (minutes):  60   Critical care time was exclusive of:  Separately billable  procedures and treating other patients   Critical care was necessary to treat or prevent imminent or life-threatening deterioration of the following conditions:  Sepsis and shock   Critical care was time spent personally by me on the following activities:  Development of treatment plan with patient or surrogate, discussions with consultants, evaluation of patient's response to treatment, examination of patient, obtaining history from patient or surrogate, ordering and performing treatments and interventions, ordering and review of laboratory studies, ordering and review of radiographic studies, pulse oximetry, re-evaluation of patient's condition and review of old charts      ____________________________________________   INITIAL IMPRESSION / [place] / ED COURSE  Pertinent labs & imaging results that were available during my care of the patient were reviewed by me and considered in my medical decision making (see chart for details).    Clinical Course as of Mar 10 204  Fri Mar 09, 2017  2324 Critically ill likely due to sepsis, patient is protecting his airway at this time and is oxygenating at 100% on the nonrebreather after being 85% on room air when EMS arrived.  His blood pressure has improved after 750 mL of normal saline by EMS.  I have made him a code sepsis and we are getting him settled then and resuscitated with IV fluids and empiric broad-spectrum antibiotics.  I have asked respiratory therapy to get an ABG to determine his acid base status, hypoxia, hypercarbia, etc. I will make a final determination about intubation after further evaluation because right now he is maintaining his airway, awakens to voice and is able to answer questions.  at this point there  is no evidence of acute CVA and his symptoms seem more consistent with sepsis.  [CF]  2340 I personally reviewed the x-ray and reviewed the radiology report.  The left basilar atelectasis could be indicative of  pneumonia.  Labs are still pending.  ABG was not able to be obtained due to poor peripheral circulation; respiratory therapist failed after 3 attempts.  We will continue fluid resuscitation and tried again.  Patient still protecting his airway and does not require intubation at this time.  Blood pressure is on the low side but stable.  [CF]  Sat Mar 10, 2017  0008 slightly elevated troponin consistent with demand ischemia.  Lactate is greater than 5, and accompanied with the borderline hypotension and hypoxemia and general overall presentation, I believe this is consistent with septic shock.  I will discuss with the hospitalist to try to expedite his movement to the ICU.  He is tolerating oxygenation on nasal cannula with no difficulty at this time and again I do not believe he needs to be emergently intubated.  [CF]  0016 labs are still coming back and are also notable for acute renal failure with a creatinine of 3.47 and a BUN of 86.  Electrolytes reassuring.  Liver function test are within normal limits.  [CF]  0022 I consulted the hospitalist, Dr. Jannifer Franklin, for admission.  We discussed the case and he agrees with the management thus far and will come see the patient in person and admit him.  Told him about my choice of antibiotics; I felt it was important to get broad-spectrum antibiotics in him quickly which is why use Zosyn, although he should be switched to another antibiotic (such as cefepime) in the setting of acute renal failure and I mentioned this to Dr. Jannifer Franklin as well.  [CF]  0031 I attempted the patient's wife, Horris Latino, who is currently at the bedside.  I explained the grave prognosis but also explained the fact that he has been improving on IV fluids and antibiotics.  She states that he self-caths and has had frequent episodes of sepsis due to urinary tract infections because he is not careful when he catheterizes himself.  She also mentioned he recently had neck/cervical spine surgery and has a  history of anterior neck tumor.  I advise caution with intubation given that we do not know what anatomical abnormalities will be present.  [CF]    Clinical Course User Index [CF] Hinda Kehr, MD    ____________________________________________  FINAL CLINICAL IMPRESSION(S) / ED DIAGNOSES  Final diagnoses:  Septic shock ([place])  Elevated lactic acid level  Demand ischemia ([place])  Acute respiratory failure with hypoxemia ([facility])  Acute renal failure, unspecified acute renal failure type ([place])     MEDICATIONS GIVEN DURING THIS VISIT:  Medications  vancomycin (VANCOCIN) 1,500 mg in sodium chloride 0.9 % 500 mL IVPB (not administered)  piperacillin-tazobactam (ZOSYN) IVPB 3.375 g (not administered)  heparin injection 5,000 Units (not administered)  0.9 %  sodium chloride infusion ( Intravenous New Bag/Given 03/10/17 0141)  acetaminophen (TYLENOL) tablet 650 mg (not administered)    Or  acetaminophen (TYLENOL) suppository 650 mg (not administered)  ondansetron (ZOFRAN) tablet 4 mg (not administered)    Or  ondansetron (ZOFRAN) injection 4 mg (not administered)  sodium chloride 0.9 % bolus 2,250 mL (0 mLs Intravenous Stopped 03/10/17 0102)  piperacillin-tazobactam (ZOSYN) IVPB 3.375 g (0 g Intravenous Stopped 03/10/17 0002)  vancomycin (VANCOCIN) IVPB 1000 mg/200 mL premix (0 mg Intravenous Stopped 03/10/17 0052)  NEW OUTPATIENT MEDICATIONS STARTED DURING THIS VISIT:  Current Discharge Medication List      Current Discharge Medication List      Current Discharge Medication List       Note:  This document was prepared using Dragon voice recognition software and may include unintentional dictation errors.    Hinda Kehr, MD 03/10/17 [phone]

## 2017-03-09 NOTE — ED Triage Notes (Signed)
Pt arrives via ems code sepsis, per ems bp 66/50, pox of 85% on ra, pt is diaphoretic, pale, history of renal carcinoma. Per ems pt was confused and spouse stated 2 days of ams and inability to ambulate.

## 2017-03-09 NOTE — ED Notes (Signed)
Ed Designer, industrial/product states she has called code sepsis

## 2017-03-09 NOTE — ED Notes (Signed)
Pt changed to nasal canula at 6lpm per dr. Karma Greaser request.

## 2017-03-10 ENCOUNTER — Inpatient Hospital Stay: Payer: Medicaid Other

## 2017-03-10 DIAGNOSIS — A419 Sepsis, unspecified organism: Secondary | ICD-10-CM | POA: Diagnosis not present

## 2017-03-10 DIAGNOSIS — B181 Chronic viral hepatitis B without delta-agent: Secondary | ICD-10-CM | POA: Diagnosis not present

## 2017-03-10 DIAGNOSIS — Z85828 Personal history of other malignant neoplasm of skin: Secondary | ICD-10-CM | POA: Diagnosis not present

## 2017-03-10 DIAGNOSIS — R6520 Severe sepsis without septic shock: Secondary | ICD-10-CM | POA: Diagnosis not present

## 2017-03-10 DIAGNOSIS — J984 Other disorders of lung: Secondary | ICD-10-CM | POA: Diagnosis not present

## 2017-03-10 DIAGNOSIS — R7881 Bacteremia: Secondary | ICD-10-CM | POA: Diagnosis not present

## 2017-03-10 DIAGNOSIS — I248 Other forms of acute ischemic heart disease: Secondary | ICD-10-CM | POA: Diagnosis present

## 2017-03-10 DIAGNOSIS — Z515 Encounter for palliative care: Secondary | ICD-10-CM | POA: Diagnosis not present

## 2017-03-10 DIAGNOSIS — F419 Anxiety disorder, unspecified: Secondary | ICD-10-CM | POA: Diagnosis not present

## 2017-03-10 DIAGNOSIS — C649 Malignant neoplasm of unspecified kidney, except renal pelvis: Secondary | ICD-10-CM | POA: Diagnosis not present

## 2017-03-10 DIAGNOSIS — N179 Acute kidney failure, unspecified: Secondary | ICD-10-CM | POA: Diagnosis present

## 2017-03-10 DIAGNOSIS — E870 Hyperosmolality and hypernatremia: Secondary | ICD-10-CM | POA: Diagnosis not present

## 2017-03-10 DIAGNOSIS — N390 Urinary tract infection, site not specified: Secondary | ICD-10-CM | POA: Diagnosis present

## 2017-03-10 DIAGNOSIS — B1910 Unspecified viral hepatitis B without hepatic coma: Secondary | ICD-10-CM | POA: Diagnosis present

## 2017-03-10 DIAGNOSIS — R918 Other nonspecific abnormal finding of lung field: Secondary | ICD-10-CM | POA: Diagnosis not present

## 2017-03-10 DIAGNOSIS — R4182 Altered mental status, unspecified: Secondary | ICD-10-CM | POA: Diagnosis present

## 2017-03-10 DIAGNOSIS — G92 Toxic encephalopathy: Secondary | ICD-10-CM | POA: Diagnosis not present

## 2017-03-10 DIAGNOSIS — R748 Abnormal levels of other serum enzymes: Secondary | ICD-10-CM | POA: Diagnosis not present

## 2017-03-10 DIAGNOSIS — J9601 Acute respiratory failure with hypoxia: Secondary | ICD-10-CM | POA: Diagnosis present

## 2017-03-10 DIAGNOSIS — C799 Secondary malignant neoplasm of unspecified site: Secondary | ICD-10-CM | POA: Diagnosis not present

## 2017-03-10 DIAGNOSIS — C7800 Secondary malignant neoplasm of unspecified lung: Secondary | ICD-10-CM | POA: Diagnosis present

## 2017-03-10 DIAGNOSIS — N170 Acute kidney failure with tubular necrosis: Secondary | ICD-10-CM | POA: Diagnosis present

## 2017-03-10 DIAGNOSIS — J189 Pneumonia, unspecified organism: Secondary | ICD-10-CM | POA: Diagnosis not present

## 2017-03-10 DIAGNOSIS — Y95 Nosocomial condition: Secondary | ICD-10-CM | POA: Diagnosis present

## 2017-03-10 DIAGNOSIS — J9602 Acute respiratory failure with hypercapnia: Secondary | ICD-10-CM | POA: Diagnosis not present

## 2017-03-10 DIAGNOSIS — Z923 Personal history of irradiation: Secondary | ICD-10-CM | POA: Diagnosis not present

## 2017-03-10 DIAGNOSIS — R6521 Severe sepsis with septic shock: Secondary | ICD-10-CM | POA: Diagnosis not present

## 2017-03-10 DIAGNOSIS — I2510 Atherosclerotic heart disease of native coronary artery without angina pectoris: Secondary | ICD-10-CM | POA: Diagnosis present

## 2017-03-10 DIAGNOSIS — J15211 Pneumonia due to Methicillin susceptible Staphylococcus aureus: Secondary | ICD-10-CM | POA: Diagnosis present

## 2017-03-10 DIAGNOSIS — I509 Heart failure, unspecified: Secondary | ICD-10-CM | POA: Diagnosis not present

## 2017-03-10 DIAGNOSIS — J9600 Acute respiratory failure, unspecified whether with hypoxia or hypercapnia: Secondary | ICD-10-CM | POA: Diagnosis not present

## 2017-03-10 DIAGNOSIS — E782 Mixed hyperlipidemia: Secondary | ICD-10-CM | POA: Diagnosis present

## 2017-03-10 DIAGNOSIS — E872 Acidosis: Secondary | ICD-10-CM | POA: Diagnosis present

## 2017-03-10 DIAGNOSIS — B9561 Methicillin susceptible Staphylococcus aureus infection as the cause of diseases classified elsewhere: Secondary | ICD-10-CM | POA: Diagnosis not present

## 2017-03-10 DIAGNOSIS — K219 Gastro-esophageal reflux disease without esophagitis: Secondary | ICD-10-CM | POA: Diagnosis present

## 2017-03-10 DIAGNOSIS — A4101 Sepsis due to Methicillin susceptible Staphylococcus aureus: Secondary | ICD-10-CM | POA: Diagnosis present

## 2017-03-10 DIAGNOSIS — Z7189 Other specified counseling: Secondary | ICD-10-CM | POA: Diagnosis not present

## 2017-03-10 DIAGNOSIS — M1990 Unspecified osteoarthritis, unspecified site: Secondary | ICD-10-CM | POA: Diagnosis present

## 2017-03-10 DIAGNOSIS — C7951 Secondary malignant neoplasm of bone: Secondary | ICD-10-CM | POA: Diagnosis present

## 2017-03-10 DIAGNOSIS — I252 Old myocardial infarction: Secondary | ICD-10-CM | POA: Diagnosis not present

## 2017-03-10 DIAGNOSIS — F329 Major depressive disorder, single episode, unspecified: Secondary | ICD-10-CM | POA: Diagnosis present

## 2017-03-10 DIAGNOSIS — E785 Hyperlipidemia, unspecified: Secondary | ICD-10-CM | POA: Diagnosis present

## 2017-03-10 DIAGNOSIS — C642 Malignant neoplasm of left kidney, except renal pelvis: Secondary | ICD-10-CM | POA: Diagnosis present

## 2017-03-10 LAB — LACTIC ACID, PLASMA
LACTIC ACID, VENOUS: 3.6 mmol/L — AB (ref 0.5–1.9)
LACTIC ACID, VENOUS: 5.2 mmol/L — AB (ref 0.5–1.9)
Lactic Acid, Venous: 5.2 mmol/L (ref 0.5–1.9)
Lactic Acid, Venous: 5.1 mmol/L (ref 0.5–1.9)

## 2017-03-10 LAB — BLOOD GAS, ARTERIAL
ACID-BASE DEFICIT: 7 mmol/L — AB (ref 0.0–2.0)
ACID-BASE DEFICIT: 7.5 mmol/L — AB (ref 0.0–2.0)
Acid-base deficit: 9.1 mmol/L — ABNORMAL HIGH (ref 0.0–2.0)
BICARBONATE: 19.3 mmol/L — AB (ref 20.0–28.0)
Bicarbonate: 20.7 mmol/L (ref 20.0–28.0)
Bicarbonate: 18 mmol/L — ABNORMAL LOW (ref 20.0–28.0)
DELIVERY SYSTEMS: POSITIVE
Delivery systems: POSITIVE
Expiratory PAP: 5
Expiratory PAP: 5
FIO2: 0.5
FIO2: 0.5
FIO2: 0.3
Inspiratory PAP: 12
Inspiratory PAP: 12
MECHVT: 550 mL
MODE: POSITIVE
Mechanical Rate: 12
O2 SAT: 97.4 %
O2 SAT: 85.7 %
O2 Saturation: 95.6 %
PATIENT TEMPERATURE: 37
PCO2 ART: 53 mmHg — AB (ref 32.0–48.0)
PEEP: 5 cmH2O
PH ART: 7.2 — AB (ref 7.350–7.450)
Patient temperature: 37
Patient temperature: 37
RATE: 18 {breaths}/min
pCO2 arterial: 41 mmHg (ref 32.0–48.0)
pCO2 arterial: 43 mmHg (ref 32.0–48.0)
pH, Arterial: 7.28 — ABNORMAL LOW (ref 7.350–7.450)
pH, Arterial: 7.23 — ABNORMAL LOW (ref 7.350–7.450)
pO2, Arterial: 106 mmHg (ref 83.0–108.0)
pO2, Arterial: 63 mmHg — ABNORMAL LOW (ref 83.0–108.0)
pO2, Arterial: 93 mmHg (ref 83.0–108.0)

## 2017-03-10 LAB — TROPONIN I
TROPONIN I: 0.29 ng/mL — AB (ref <0.03)
Troponin I: 0.24 ng/mL (ref <0.03)
Troponin I: 0.11 ng/mL (ref <0.03)
Troponin I: 0.09 ng/mL (ref <0.03)

## 2017-03-10 LAB — BLOOD GAS, VENOUS
ACID-BASE DEFICIT: 6.5 mmol/L — AB (ref 0.0–2.0)
Bicarbonate: 23 mmol/L (ref 20.0–28.0)
FIO2: 1
PATIENT TEMPERATURE: 37
PCO2 VEN: 72 mmHg — AB (ref 44.0–60.0)
pH, Ven: 7.12 — CL (ref 7.250–7.430)
pO2, Ven: 35 mmHg (ref 32.0–45.0)

## 2017-03-10 LAB — CBC WITH DIFFERENTIAL/PLATELET
BASOS PCT: 0 %
Basophils Absolute: 0 10*3/uL (ref 0–0.1)
EOS ABS: 0 10*3/uL (ref 0–0.7)
Eosinophils Relative: 0 %
HCT: 35.5 % — ABNORMAL LOW (ref 40.0–52.0)
HEMOGLOBIN: 11.8 g/dL — AB (ref 13.0–18.0)
LYMPHS ABS: 0.2 10*3/uL — AB (ref 1.0–3.6)
Lymphocytes Relative: 2 %
MCH: 30.1 pg (ref 26.0–34.0)
MCHC: 33.4 g/dL (ref 32.0–36.0)
MCV: 90.2 fL (ref 80.0–100.0)
MONO ABS: 0.9 10*3/uL (ref 0.2–1.0)
MONOS PCT: 11 %
NEUTROS ABS: 6.5 10*3/uL (ref 1.4–6.5)
Neutrophils Relative %: 87 %
Platelets: 116 10*3/uL — ABNORMAL LOW (ref 150–440)
RBC: 3.93 MIL/uL — ABNORMAL LOW (ref 4.40–5.90)
RDW: 25.4 % — ABNORMAL HIGH (ref 11.5–14.5)
WBC: 7.6 10*3/uL (ref 3.8–10.6)

## 2017-03-10 LAB — BASIC METABOLIC PANEL
ANION GAP: 11 (ref 5–15)
Anion gap: 12 (ref 5–15)
BUN: 90 mg/dL — ABNORMAL HIGH (ref 6–20)
BUN: 94 mg/dL — ABNORMAL HIGH (ref 6–20)
CALCIUM: 9 mg/dL (ref 8.9–10.3)
CO2: 23 mmol/L (ref 22–32)
CO2: 20 mmol/L — ABNORMAL LOW (ref 22–32)
CREATININE: 3.02 mg/dL — AB (ref 0.61–1.24)
Calcium: 8.3 mg/dL — ABNORMAL LOW (ref 8.9–10.3)
Chloride: 104 mmol/L (ref 101–111)
Chloride: 108 mmol/L (ref 101–111)
Creatinine, Ser: 2.71 mg/dL — ABNORMAL HIGH (ref 0.61–1.24)
GFR calc Af Amer: 28 mL/min — ABNORMAL LOW (ref >60)
GFR calc non Af Amer: 21 mL/min — ABNORMAL LOW (ref >60)
GFR, EST AFRICAN AMERICAN: 24 mL/min — AB (ref >60)
GFR, EST NON AFRICAN AMERICAN: 24 mL/min — AB (ref >60)
Glucose, Bld: 153 mg/dL — ABNORMAL HIGH (ref 65–99)
Glucose, Bld: 137 mg/dL — ABNORMAL HIGH (ref 65–99)
POTASSIUM: 3.9 mmol/L (ref 3.5–5.1)
Potassium: 4.1 mmol/L (ref 3.5–5.1)
Sodium: 139 mmol/L (ref 135–145)
Sodium: 139 mmol/L (ref 135–145)

## 2017-03-10 LAB — URINALYSIS, COMPLETE (UACMP) WITH MICROSCOPIC
Bilirubin Urine: NEGATIVE
GLUCOSE, UA: NEGATIVE mg/dL
Ketones, ur: NEGATIVE mg/dL
NITRITE: NEGATIVE
PH: 7 (ref 5.0–8.0)
PROTEIN: 100 mg/dL — AB
SPECIFIC GRAVITY, URINE: 1.01 (ref 1.005–1.030)

## 2017-03-10 LAB — BLOOD CULTURE ID PANEL (REFLEXED)
Acinetobacter baumannii: NOT DETECTED
CANDIDA GLABRATA: NOT DETECTED
CANDIDA TROPICALIS: NOT DETECTED
Candida albicans: NOT DETECTED
Candida krusei: NOT DETECTED
Candida parapsilosis: NOT DETECTED
ENTEROBACTER CLOACAE COMPLEX: NOT DETECTED
ENTEROCOCCUS SPECIES: NOT DETECTED
Enterobacteriaceae species: NOT DETECTED
Escherichia coli: NOT DETECTED
Haemophilus influenzae: NOT DETECTED
KLEBSIELLA PNEUMONIAE: NOT DETECTED
Klebsiella oxytoca: NOT DETECTED
LISTERIA MONOCYTOGENES: NOT DETECTED
Methicillin resistance: NOT DETECTED
NEISSERIA MENINGITIDIS: NOT DETECTED
Proteus species: NOT DETECTED
Pseudomonas aeruginosa: NOT DETECTED
STAPHYLOCOCCUS SPECIES: DETECTED — AB
STREPTOCOCCUS SPECIES: NOT DETECTED
Serratia marcescens: NOT DETECTED
Staphylococcus aureus (BCID): DETECTED — AB
Streptococcus agalactiae: NOT DETECTED
Streptococcus pneumoniae: NOT DETECTED
Streptococcus pyogenes: NOT DETECTED

## 2017-03-10 LAB — COMPREHENSIVE METABOLIC PANEL
ALK PHOS: 94 U/L (ref 38–126)
ALT: 19 U/L (ref 17–63)
AST: 39 U/L (ref 15–41)
Albumin: 2.4 g/dL — ABNORMAL LOW (ref 3.5–5.0)
Anion gap: 14 (ref 5–15)
BUN: 86 mg/dL — ABNORMAL HIGH (ref 6–20)
CALCIUM: 9.3 mg/dL (ref 8.9–10.3)
CO2: 23 mmol/L (ref 22–32)
CREATININE: 3.47 mg/dL — AB (ref 0.61–1.24)
Chloride: 102 mmol/L (ref 101–111)
GFR, EST AFRICAN AMERICAN: 20 mL/min — AB (ref >60)
GFR, EST NON AFRICAN AMERICAN: 18 mL/min — AB (ref >60)
Glucose, Bld: 160 mg/dL — ABNORMAL HIGH (ref 65–99)
Potassium: 4.2 mmol/L (ref 3.5–5.1)
Sodium: 139 mmol/L (ref 135–145)
Total Bilirubin: 0.7 mg/dL (ref 0.3–1.2)
Total Protein: 6.3 g/dL — ABNORMAL LOW (ref 6.5–8.1)

## 2017-03-10 LAB — CBC
HCT: 33.3 % — ABNORMAL LOW (ref 40.0–52.0)
Hemoglobin: 11.1 g/dL — ABNORMAL LOW (ref 13.0–18.0)
MCH: 29.6 pg (ref 26.0–34.0)
MCHC: 33.3 g/dL (ref 32.0–36.0)
MCV: 88.7 fL (ref 80.0–100.0)
PLATELETS: 116 10*3/uL — AB (ref 150–440)
RBC: 3.75 MIL/uL — AB (ref 4.40–5.90)
RDW: 25.2 % — ABNORMAL HIGH (ref 11.5–14.5)
WBC: 5.1 10*3/uL (ref 3.8–10.6)

## 2017-03-10 LAB — PROCALCITONIN
PROCALCITONIN: 21.78 ng/mL
PROCALCITONIN: 19.73 ng/mL

## 2017-03-10 LAB — COMPREHENSIVE METABOLIC PANEL WITH GFR
ALT: 23 U/L (ref 17–63)
AST: 56 U/L — ABNORMAL HIGH (ref 15–41)
Albumin: 1.8 g/dL — ABNORMAL LOW (ref 3.5–5.0)
Alkaline Phosphatase: 76 U/L (ref 38–126)
Anion gap: 12 (ref 5–15)
BUN: 93 mg/dL — ABNORMAL HIGH (ref 6–20)
CO2: 19 mmol/L — ABNORMAL LOW (ref 22–32)
Calcium: 8.1 mg/dL — ABNORMAL LOW (ref 8.9–10.3)
Chloride: 109 mmol/L (ref 101–111)
Creatinine, Ser: 2.49 mg/dL — ABNORMAL HIGH (ref 0.61–1.24)
GFR calc Af Amer: 30 mL/min — ABNORMAL LOW
GFR calc non Af Amer: 26 mL/min — ABNORMAL LOW
Glucose, Bld: 124 mg/dL — ABNORMAL HIGH (ref 65–99)
Potassium: 4 mmol/L (ref 3.5–5.1)
Sodium: 140 mmol/L (ref 135–145)
Total Bilirubin: 0.8 mg/dL (ref 0.3–1.2)
Total Protein: 5.2 g/dL — ABNORMAL LOW (ref 6.5–8.1)

## 2017-03-10 LAB — BRAIN NATRIURETIC PEPTIDE: B NATRIURETIC PEPTIDE 5: 957 pg/mL — AB (ref 0.0–100.0)

## 2017-03-10 LAB — PROTIME-INR
INR: 1.4
Prothrombin Time: 17 seconds — ABNORMAL HIGH (ref 11.4–15.2)

## 2017-03-10 LAB — GLUCOSE, CAPILLARY
Glucose-Capillary: 115 mg/dL — ABNORMAL HIGH (ref 65–99)
Glucose-Capillary: 162 mg/dL — ABNORMAL HIGH (ref 65–99)

## 2017-03-10 LAB — MRSA PCR SCREENING: MRSA BY PCR: NEGATIVE

## 2017-03-10 LAB — PHOSPHORUS: Phosphorus: 3.6 mg/dL (ref 2.5–4.6)

## 2017-03-10 LAB — LIPASE, BLOOD: LIPASE: 11 U/L (ref 11–51)

## 2017-03-10 LAB — MAGNESIUM: MAGNESIUM: 2 mg/dL (ref 1.7–2.4)

## 2017-03-10 MED ORDER — SODIUM CHLORIDE 0.9 % IV BOLUS (SEPSIS)
1000.0000 mL | INTRAVENOUS | Status: AC
  Administered 2017-03-10 (×2): 1000 mL via INTRAVENOUS

## 2017-03-10 MED ORDER — MIDAZOLAM HCL 2 MG/2ML IJ SOLN
2.0000 mg | INTRAMUSCULAR | Status: DC | PRN
  Administered 2017-03-12: 2 mg via INTRAVENOUS
  Filled 2017-03-10 (×2): qty 2

## 2017-03-10 MED ORDER — ACETAMINOPHEN 650 MG RE SUPP
650.0000 mg | Freq: Four times a day (QID) | RECTAL | Status: DC | PRN
  Administered 2017-03-10 – 2017-03-13 (×2): 650 mg via RECTAL
  Filled 2017-03-10 (×2): qty 1

## 2017-03-10 MED ORDER — NAFCILLIN SODIUM 2 G IJ SOLR
2.0000 g | INTRAMUSCULAR | Status: DC
  Filled 2017-03-10: qty 2000

## 2017-03-10 MED ORDER — ORAL CARE MOUTH RINSE
15.0000 mL | Freq: Four times a day (QID) | OROMUCOSAL | Status: DC
  Administered 2017-03-10: 15 mL via OROMUCOSAL

## 2017-03-10 MED ORDER — FENTANYL CITRATE (PF) 100 MCG/2ML IJ SOLN
100.0000 ug | Freq: Once | INTRAMUSCULAR | Status: AC
  Administered 2017-03-10: 100 ug via INTRAVENOUS

## 2017-03-10 MED ORDER — PHENYLEPHRINE HCL 10 MG/ML IJ SOLN
0.0000 ug/min | Freq: Once | INTRAVENOUS | Status: DC
  Filled 2017-03-10: qty 1

## 2017-03-10 MED ORDER — VECURONIUM BROMIDE 10 MG IV SOLR
INTRAVENOUS | Status: AC
  Administered 2017-03-10: 10 mg
  Filled 2017-03-10: qty 10

## 2017-03-10 MED ORDER — IPRATROPIUM-ALBUTEROL 0.5-2.5 (3) MG/3ML IN SOLN
3.0000 mL | Freq: Four times a day (QID) | RESPIRATORY_TRACT | Status: DC
  Administered 2017-03-10 – 2017-03-12 (×9): 3 mL via RESPIRATORY_TRACT
  Filled 2017-03-10: qty 3
  Filled 2017-03-10: qty 48
  Filled 2017-03-10 (×7): qty 3

## 2017-03-10 MED ORDER — FENTANYL CITRATE (PF) 100 MCG/2ML IJ SOLN
INTRAMUSCULAR | Status: AC
Start: 2017-03-10 — End: 2017-03-10
  Filled 2017-03-10: qty 4

## 2017-03-10 MED ORDER — CHLORHEXIDINE GLUCONATE 0.12% ORAL RINSE (MEDLINE KIT)
15.0000 mL | Freq: Two times a day (BID) | OROMUCOSAL | Status: DC
  Administered 2017-03-10 – 2017-03-12 (×4): 15 mL via OROMUCOSAL

## 2017-03-10 MED ORDER — MIDAZOLAM HCL 2 MG/2ML IJ SOLN
INTRAMUSCULAR | Status: AC
  Filled 2017-03-10: qty 4

## 2017-03-10 MED ORDER — NOREPINEPHRINE BITARTRATE 1 MG/ML IV SOLN
0.0000 ug/min | INTRAVENOUS | Status: DC
  Administered 2017-03-10: 3 ug/min via INTRAVENOUS
  Administered 2017-03-11: 2 ug/min via INTRAVENOUS
  Filled 2017-03-10 (×2): qty 4

## 2017-03-10 MED ORDER — ORAL CARE MOUTH RINSE
15.0000 mL | OROMUCOSAL | Status: DC
  Administered 2017-03-10 – 2017-03-14 (×32): 15 mL via OROMUCOSAL

## 2017-03-10 MED ORDER — SENNOSIDES 8.8 MG/5ML PO SYRP
5.0000 mL | ORAL_SOLUTION | Freq: Two times a day (BID) | ORAL | Status: DC | PRN
  Filled 2017-03-10: qty 5

## 2017-03-10 MED ORDER — ONDANSETRON HCL 4 MG/2ML IJ SOLN: 4.0000 mg | Freq: Four times a day (QID) | INTRAMUSCULAR | Status: DC | PRN

## 2017-03-10 MED ORDER — FENTANYL BOLUS VIA INFUSION
50.0000 ug | INTRAVENOUS | Status: DC | PRN
  Administered 2017-03-11 (×2): 50 ug via INTRAVENOUS
  Administered 2017-03-11: 100 ug via INTRAVENOUS
  Administered 2017-03-11 – 2017-03-12 (×2): 50 ug via INTRAVENOUS
  Administered 2017-03-12: 100 ug via INTRAVENOUS
  Filled 2017-03-10: qty 100

## 2017-03-10 MED ORDER — ACETAMINOPHEN 325 MG PO TABS
650.0000 mg | ORAL_TABLET | Freq: Four times a day (QID) | ORAL | Status: DC | PRN
  Administered 2017-03-12: 650 mg via ORAL
  Filled 2017-03-10: qty 2

## 2017-03-10 MED ORDER — VANCOMYCIN HCL 10 G IV SOLR
1500.0000 mg | INTRAVENOUS | Status: DC
  Administered 2017-03-10: 1500 mg via INTRAVENOUS
  Filled 2017-03-10: qty 1500

## 2017-03-10 MED ORDER — MIDAZOLAM HCL 2 MG/2ML IJ SOLN
2.0000 mg | INTRAMUSCULAR | Status: AC | PRN
  Administered 2017-03-11 – 2017-03-12 (×3): 2 mg via INTRAVENOUS
  Filled 2017-03-10 (×2): qty 2

## 2017-03-10 MED ORDER — SODIUM CHLORIDE 0.9 % IV SOLN
INTRAVENOUS | Status: AC
  Administered 2017-03-10: 02:00:00 via INTRAVENOUS

## 2017-03-10 MED ORDER — NAFCILLIN SODIUM 2 G IJ SOLR
2.0000 g | INTRAVENOUS | Status: DC
  Administered 2017-03-10 – 2017-03-12 (×9): 2 g via INTRAVENOUS
  Filled 2017-03-10 (×14): qty 2000

## 2017-03-10 MED ORDER — MIDAZOLAM HCL 2 MG/2ML IJ SOLN
4.0000 mg | Freq: Once | INTRAMUSCULAR | Status: AC
  Administered 2017-03-10: 4 mg via INTRAVENOUS

## 2017-03-10 MED ORDER — BISACODYL 10 MG RE SUPP: 10.0000 mg | Freq: Every day | RECTAL | Status: DC | PRN

## 2017-03-10 MED ORDER — PANTOPRAZOLE SODIUM 40 MG IV SOLR
40.0000 mg | Freq: Every day | INTRAVENOUS | Status: DC
  Administered 2017-03-10 – 2017-03-11 (×2): 40 mg via INTRAVENOUS
  Filled 2017-03-10 (×2): qty 40

## 2017-03-10 MED ORDER — FENTANYL 2500MCG IN NS 250ML (10MCG/ML) PREMIX INFUSION
25.0000 ug/h | INTRAVENOUS | Status: DC
  Administered 2017-03-10 – 2017-03-11 (×2): 75 ug/h via INTRAVENOUS
  Administered 2017-03-12: 200 ug/h via INTRAVENOUS
  Administered 2017-03-12: 50 ug/h via INTRAVENOUS
  Administered 2017-03-13: 150 ug/h via INTRAVENOUS
  Administered 2017-03-14: 250 ug/h via INTRAVENOUS
  Administered 2017-03-14 – 2017-03-15 (×2): 275 ug/h via INTRAVENOUS
  Administered 2017-03-15: 375 ug/h via INTRAVENOUS
  Administered 2017-03-15: 275 ug/h via INTRAVENOUS
  Administered 2017-03-16: 300 ug/h via INTRAVENOUS
  Administered 2017-03-16: 400 ug/h via INTRAVENOUS
  Filled 2017-03-10 (×13): qty 250

## 2017-03-10 MED ORDER — HEPARIN SODIUM (PORCINE) 5000 UNIT/ML IJ SOLN
5000.0000 [IU] | Freq: Three times a day (TID) | INTRAMUSCULAR | Status: DC
  Administered 2017-03-10 – 2017-03-12 (×7): 5000 [IU] via SUBCUTANEOUS
  Filled 2017-03-10 (×7): qty 1

## 2017-03-10 MED ORDER — FENTANYL CITRATE (PF) 100 MCG/2ML IJ SOLN
50.0000 ug | Freq: Once | INTRAMUSCULAR | Status: AC
  Administered 2017-03-10: 100 ug via INTRAVENOUS

## 2017-03-10 MED ORDER — ORAL CARE MOUTH RINSE
15.0000 mL | Freq: Four times a day (QID) | OROMUCOSAL | Status: DC
  Administered 2017-03-10 (×2): 15 mL via OROMUCOSAL

## 2017-03-10 MED ORDER — SODIUM CHLORIDE 0.9 % IV SOLN
INTRAVENOUS | Status: DC
  Administered 2017-03-10 – 2017-03-12 (×4): via INTRAVENOUS

## 2017-03-10 MED ORDER — CHLORHEXIDINE GLUCONATE 0.12% ORAL RINSE (MEDLINE KIT)
15.0000 mL | Freq: Two times a day (BID) | OROMUCOSAL | Status: DC
  Administered 2017-03-10: 15 mL via OROMUCOSAL

## 2017-03-10 MED ORDER — PIPERACILLIN-TAZOBACTAM 3.375 G IVPB
3.3750 g | Freq: Three times a day (TID) | INTRAVENOUS | Status: DC
  Administered 2017-03-10 (×2): 3.375 g via INTRAVENOUS
  Filled 2017-03-10 (×2): qty 50

## 2017-03-10 MED ORDER — ONDANSETRON HCL 4 MG PO TABS: 4.0000 mg | ORAL_TABLET | Freq: Four times a day (QID) | ORAL | Status: DC | PRN

## 2017-03-10 NOTE — ED Notes (Signed)
Dr. Karma Greaser in to update pt's spouse

## 2017-03-10 NOTE — ED Notes (Signed)
Pt's spouse at bedside and updated on treatment plan.

## 2017-03-10 NOTE — ED Notes (Signed)
Critical lactic acid of 5.2 called from lab with critical troponin of 0.24 called from lab. Dr. Karma Greaser notified, no new orders received.

## 2017-03-10 NOTE — Progress Notes (Signed)
Patient's family member, Horris Latino, was called twice. A voicemail asking her to please call back was left both times.

## 2017-03-10 NOTE — Care Management Note (Signed)
Case Management Note  Patient Details  Name: Marc Blake MRN: [id number] Date of Birth: 09-09-1955  Subjective/Objective:   Mr Passage is currently intubated. Per consult to CM for Palliative Care and/or [place], this CM will attempt to discuss options with wife tomorrow and provide choices. Currently no family at bedside. Reportedly [name] has had Palliative Care in the past. [name] is an active chemotherapy patient and lives at home with his wife.                   Action/Plan:   Expected Discharge Date:                  Expected Discharge Plan:     In-House Referral:     Discharge planning Services     Post Acute Care Choice:    Choice offered to:     DME Arranged:    DME Agency:     HH Arranged:    HH Agency:     Status of Service:     If discussed at [employer] of Stay Meetings, dates discussed:    Additional Comments:  Laria Grimmett A, RN 03/10/2017, 4:29 PM

## 2017-03-10 NOTE — Procedures (Signed)
Endotracheal Intubation: Patient required placement of an artificial airway secondary to resp failure.   Consent: Emergent.   Hand washing performed prior to starting the procedure.   Medications administered for sedation prior to procedure: Midazolam 4 mg IV,  Vecuronium 10 mg IV, Fentanyl 100 mcg IV.   Procedure: A time out procedure was called and correct patient, name, & ID confirmed. Needed supplies and equipment were assembled and checked to include ETT, 10 ml syringe, Glidescope, Mac and Miller blades, suction, oxygen and bag mask valve, end tidal CO2 monitor. Patient was positioned to align the mouth and pharynx to facilitate visualization of the glottis.  Heart rate, SpO2 and blood pressure was continuously monitored during the procedure. Pre-oxygenation was conducted prior to intubation and endotracheal tube was placed through the vocal cords into the trachea.     The artificial airway was placed under direct visualization via glidescope route using a ETT on the first attempt.    ETT was secured at 24 cm mark.    Placement was confirmed by auscuitation of lungs with good breath sounds bilaterally and no stomach sounds.  Condensation was noted on endotracheal tube.  Pulse ox %.  CO2 detector in place with appropriate color change.   Complications: None .   Operator: Liborio Nixon RRT.   Chest radiograph ordered and pending.    Marda Stalker, M.D.

## 2017-03-10 NOTE — Consult Note (Signed)
PULMONARY / CRITICAL CARE MEDICINE   Name: Marc Blake MRN: [id number] DOB: [birth year]    ADMISSION DATE:  02/20/2017   CONSULTATION DATE:  03/10/2017  REFERRING MD:  Dr. Jannifer Franklin  Reason: septic shock  CHIEF COMPLAINT:  Altered mental status  HISTORY OF PRESENT ILLNESS:   This is a 61 year old Caucasian male with a history of renal cell carcinoma, had chemotherapy one month ago; presenting with altered mental status. History is obtained from patient's wife and EMS/ED records. Patient's states that 2 days ago patient became more confused and symptoms got worse today since she decided to call EMS. Upon EMS arrival, patient was hypotensive with a blood pressure of 60/40, tachycardic with heart rate in the 140s, SPO2 of 85% on room air and confused. He was placed on BiPAP and a code sepsis was called.his gait. Initial workup shows a creatinine level of 3.47 up from 0.9 at his baseline, BUN of 86, mildly elevated troponin, lactic acid of 5.2 and pro-calcitonin level of 21.78.his urinalysis showed large amounts of leukocyte esterase,and WBCs. He is being admitted and started to the ICU for management of urosepsis and septic shock  PAST MEDICAL HISTORY :  He  has a past medical history of Acid reflux; Anxiety; Arthritis; CHF (congestive heart failure) ([place]); Depression; Depression; Hyperlipidemia; Hypertension; Myocardial infarction ([place]); Renal cancer ([place]); Renal cell carcinoma ([place]); Renal insufficiency; and Skin cancer.  PAST SURGICAL HISTORY: He  has a past surgical history that includes Back surgery; Leg Surgery (Left); Knee surgery (Left); Tonsillectomy; Cardiac catheterization; Cystoscopy with urethral dilatation (N/A, 03/06/2016); and Cystoscopy with biopsy (03/06/2016).  No Known Allergies  No current facility-administered medications on file prior to encounter.    Current Outpatient Prescriptions on File Prior to Encounter  Medication Sig  . amLODipine (NORVASC) 10 MG tablet Take  10 mg by mouth daily.  Marland Kitchen atorvastatin (LIPITOR) 20 MG tablet TAKE 1 TABLET ONCE A DAY (AT BEDTIME) FOR 30 DAYS  . busPIRone (BUSPAR) 7.5 MG tablet   . carvedilol (COREG) 12.5 MG tablet TAKE 1 TABLET BY MOUTH TWICE A DAY FOR 30 DAYS  . [facility] GENTLE LAXATIVE 10 MG suppository INSERT 1 SUPPOSITORY RECTALLY EVERY 24HRS AS NEEDED FOR CONSTIPATION  . [facility] STOOL SOFTENER 8.6-50 MG tablet TAKE 1 TABLET BY MOUTH TWICE A DAY AS NEEDED CONSTIPATION  . fluocinonide cream (LIDEX) 0.05 %   . gabapentin (NEURONTIN) 300 MG capsule Take 300 mg by mouth 3 (three) times daily.   Marland Kitchen ibuprofen (ADVIL,MOTRIN) 200 MG tablet Take 200 mg by mouth every 8 (eight) hours as needed for moderate pain. 4 every 8 hours  . ipratropium-albuterol (DUONEB) 0.5-2.5 (3) MG/3ML SOLN   . lisinopril (PRINIVIL,ZESTRIL) 10 MG tablet   . LORazepam (ATIVAN) 0.5 MG tablet Take 1 tablet (0.5 mg total) by mouth every 8 (eight) hours.  Marland Kitchen morphine (MS CONTIN) 30 MG 12 hr tablet Take 1 tablet (30 mg total) by mouth every 12 (twelve) hours.  Marland Kitchen oxyCODONE (OXY IR/ROXICODONE) 5 MG immediate release tablet Take 1 tablet (5 mg total) by mouth every 6 (six) hours as needed (every 6 h prn).  . potassium chloride SA (K-DUR,KLOR-CON) 20 MEQ tablet Take 1 tablet (20 mEq total) by mouth daily. For 3 days  . senna (SENOKOT) 8.6 MG tablet Take 2 tablets by mouth at bedtime.   . sertraline (ZOLOFT) 100 MG tablet Take 100 mg by mouth at bedtime.  Marland Kitchen tenofovir (VIREAD) 300 MG tablet Take 1 tablet (300 mg total) by mouth daily.  Marland Kitchen  triamcinolone cream (KENALOG) 0.5 % Apply topically 2 (two) times daily.  Alveda Reasons 20 MG TABS tablet Take 20 mg by mouth daily.    FAMILY HISTORY:  His indicated that his mother is deceased. He indicated that his father is deceased. He indicated that the status of his sister is unknown. He indicated that the status of his brother is unknown. He indicated that the status of his maternal uncle is unknown.    SOCIAL HISTORY: He  reports  that he has never smoked. He has never used smokeless tobacco. He reports that he does not drink alcohol or use drugs.  REVIEW OF SYSTEMS:   Unable to obtain as patient is on BiPAP and somnolent  SUBJECTIVE:   VITAL SIGNS: BP 91/63   Pulse (!) 123   Temp 98.5 F (36.9 C)   Resp 18   Ht 6\' 3"  (1.905 m)   Wt 210 lb (95.3 kg)   SpO2 96%   BMI 26.25 kg/m   HEMODYNAMICS:    VENTILATOR SETTINGS:    INTAKE / OUTPUT: No intake/output data recorded.  PHYSICAL EXAMINATION: General: acutely ill-looking Neuro:  Awakens to voice and touch, nods in agreement or disagreement, follows basic commands HEENT: PERRLA, oral mucosa dry, trachea midline Cardiovascular:  Peter pulse tachycardic, regular, S1, S2, no murmur, regurg or gallop, +2 pulses bilaterally, +2 edema bilaterally Lungs: increased work of breathing, bilateral breath sounds, diminished in the bases, no wheezing or rhonchi Abdomen:  Nondistended, normal bowel sounds in all 4 quadrants, palpation reveals no organomegaly Musculoskeletal: no deformities, positive range of motion Skin:  Warm, dry with poor turgor  LABS:  BMET  Recent Labs Lab 03/03/2017 2220  NA 139  K 4.2  CL 102  CO2 23  BUN 86*  CREATININE 3.47*  GLUCOSE 160*    Electrolytes  Recent Labs Lab 02/19/2017 2220  CALCIUM 9.3    CBC  Recent Labs Lab 02/21/2017 2220  WBC 7.6  HGB 11.8*  HCT 35.5*  PLT 116*    Coag's  Recent Labs Lab 03/10/2017 2220  INR 1.40    Sepsis Markers  Recent Labs Lab 02/21/2017 2220  LATICACIDVEN 5.2*  PROCALCITON 21.78    ABG  Recent Labs Lab 03/10/17 0125  PHART 7.28*  PCO2ART 41  PO2ART 106    Liver Enzymes  Recent Labs Lab 02/23/2017 2220  AST 39  ALT 19  ALKPHOS 94  BILITOT 0.7  ALBUMIN 2.4*    Cardiac Enzymes  Recent Labs Lab 03/10/2017 2220  TROPONINI 0.24*    Glucose No results for input(s): GLUCAP in the last 168 hours.  Imaging Dg Chest Port 1 View  Result Date:  02/22/2017 CLINICAL DATA:  Respiratory distress. EXAM: PORTABLE CHEST 1 VIEW COMPARISON:  Chest CT 10/20/2016 FINDINGS: Midthoracic fusion and corpectomy Low lung volumes. Lung metastasis not well identified. One nodule noted LEFT lung base. For low lung volumes. No focal infiltrate. Basilar atelectasis on LEFT. IMPRESSION: LEFT basilar atelectasis.  No evidence pneumonia.  Mild congestion. Pulmonary metastasis under appreciated on radiograph Electronically Signed   By: Suzy Bouchard M.D.   On: 02/17/2017 23:35    STUDIES:  none  CULTURES: Blood cultures 2. Urine cultures  ANTIBIOTICS: Vancomycin Zosyn  SIGNIFICANT EVENTS: 03/10/2017: Admitted  LINES/TUBES: Foley catheter. Peripheral IVs  DISCUSSION: 61 year old male with renal cell carcinoma presenting with urosepsis, septic shock, acute respiratory failure and acute renal failure  ASSESSMENT / PLAN: Septic shock. Acute renal failure- creatinine up to 3.47 from baseline of 0.96  Urosepsis. Acute respiratory failure Severe lactic acidosis Sinus tachycardia History of renal cell carcinoma History of hypertension History of congestive heart failure Plan Hemodynamics per ICU protocol. IV fluid resuscitation Pressors if needed to maintain mean arterial blood pressure greater than 65 Broad-spectrum antibiotics. Follow-up cultures Trend pro-calcitonin Monitor fever curve Nephrology consult Trend lactic acid level. Patient may require CRRT-- will hold off, pt now has good urine output with decreasing creatinine. Continue IVF and monitor.  EKG now and when necessary Repeat ABG GI and DVT prophylaxis  Patient is at high risk for intubation  FAMILY  - Updates: no family at bedside. We'll updated when available  - Inter-disciplinary family meet or Palliative Care meeting due by:  day [place]. [facility] ANP-[facility] Pulmonary and [place] Pager [phone] or [phone] 03/10/2017,  2:05 AM   STAFF NOTE: I. Dr. Ashby Dawes, have personally reviewed the patient's available data including medical history , events of notes, physican examination and test results as part of my evaluation. I have discussed with the  Care with the NP and other care providers including  pharmacist, ICU RN, RRT, dietary.  Physical Exam Lungs - Decreased air entry bilaterally. Pt unresponsive.   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 He has undergone multiple courses of chemo and has undergone multiple surgeries as reviewed in [name] note from 03/02/17. He presented to ED with reduced mental status and weakness. He appears to be in severe septic shock, source is uncertain. I personally reviewed images which suggestive of emphysema.  Will continue broad spectrum abx, fluid resuscitation, respiratory support. Prognosis guarded.   Marda Stalker, MD.   Board Certified in Internal Medicine, Pulmonary Medicine, [place], and Sleep Medicine.  [facility] Office Number: [phone] Pager: [phone]  Patricia Pesa, M.D.  Merton Border, M.D  03/10/2017   Critical Care Attestation.  I have personally obtained a history, examined the patient, evaluated laboratory and imaging results, formulated the assessment and plan and placed orders. The Patient requires high complexity decision making for assessment and support, frequent evaluation and titration of therapies, application of advanced monitoring technologies and extensive interpretation of multiple databases. The patient has critical illness that could lead imminently to failure of 1 or more organ systems and requires the highest level of physician preparedness to intervene.  Critical Care Time devoted to patient care services described in this note is 60 minutes and is exclusive of time spent in procedures or supervisory time  of NP.

## 2017-03-10 NOTE — Progress Notes (Signed)
Lactic acid 5.1.  Notified Maggie, NP.

## 2017-03-10 NOTE — Progress Notes (Signed)
[facility] [place] NAME: Marc Blake    MR#:  [id number]  DATE OF BIRTH:  10-Mar-1956  SUBJECTIVE:  CHIEF COMPLAINT:   Chief Complaint  Patient presents with  . Code Sepsis   -Patient with left renal cell carcinoma with metastatic disease, admitted with altered mental status and respiratory failure. -Failed BiPAP, just intubated this morning. Also hypotensive.  REVIEW OF SYSTEMS:  Review of Systems  Unable to perform ROS: Critical illness    DRUG ALLERGIES:  No Known Allergies  VITALS:  Blood pressure (!) 92/57, pulse (!) 134, temperature 99.5 F (37.5 C), temperature source Core (Comment), resp. rate 20, height 6\' 3"  (1.905 m), weight 95.9 kg (211 lb 6.7 oz), SpO2 100 %.  PHYSICAL EXAMINATION:  Physical Exam  =GENERAL:  61 y.o.-year-old patient lying in the bed, Sedated, critically ill appearing. Disheveled looking  EYES: Pupils equal, round, sluggish reaction to light. No scleral icterus. Extraocular muscles intact.  HEENT: Head atraumatic, normocephalic. Oropharynx and nasopharynx clear.  NECK:  Supple, no jugular venous distention. No thyroid enlargement, no tenderness.  LUNGS: Normal breath sounds bilaterally, no wheezing, rales,rhonchi or crepitation. No use of accessory muscles of respiration. Decreased bibasilar breath sounds CARDIOVASCULAR: S1, S2 normal. No murmurs, rubs, or gallops.  ABDOMEN: Soft, nontender, nondistended. Bowel sounds present. No organomegaly or mass.  EXTREMITIES: No pedal edema, cyanosis, or clubbing.  NEUROLOGIC: Sedated and just intubated PSYCHIATRIC: The patient is sedated.  SKIN: No obvious rash, lesion, or ulcer.    LABORATORY PANEL:   CBC  Recent Labs Lab 03/10/17 0244  WBC 5.1  HGB 11.1*  HCT 33.3*  PLT 116*   ------------------------------------------------------------------------------------------------------------------  Chemistries   Recent Labs Lab 02/27/2017 2220  03/10/17 0244  NA 139 139  K 4.2 4.1  CL 102 104  CO2 23 23  GLUCOSE 160* 153*  BUN 86* 90*  CREATININE 3.47* 3.02*  CALCIUM 9.3 9.0  MG  --  2.0  AST 39  --   ALT 19  --   ALKPHOS 94  --   BILITOT 0.7  --    ------------------------------------------------------------------------------------------------------------------  Cardiac Enzymes  Recent Labs Lab 03/10/17 0245  TROPONINI 0.29*   ------------------------------------------------------------------------------------------------------------------  RADIOLOGY:  Dg Chest Port 1 View  Result Date: 03/10/2017 CLINICAL DATA:  Check central line placement EXAM: PORTABLE CHEST 1 VIEW COMPARISON:  03/10/2017 FINDINGS: Cardiac shadow is stable. Postoperative changes are noted in the midthoracic spine. Right subclavian central line is noted with catheter tip in the superior right atrium. No pneumothorax is seen. Endotracheal tube is again noted in satisfactory position. Mild bibasilar atelectasis is seen. IMPRESSION: A pneumothorax following central line placement on the right. The remainder of the exam is stable. Electronically Signed   By: Inez Catalina M.D.   On: 03/10/2017 09:13   Dg Chest Port 1 View  Result Date: 03/10/2017 CLINICAL DATA:  Acute respiratory failure. Encounter for intubation. EXAM: PORTABLE CHEST 1 VIEW COMPARISON:  Chest x-ray dated 03/02/2017. FINDINGS: Endotracheal tube appears well positioned with tip approximately 2.5 cm above the carina. Patchy bibasilar airspace opacities, atelectasis versus pneumonia. Probable small left pleural effusion. No pneumothorax seen. Heart size and mediastinal contours appear stable. No acute or suspicious osseous finding. Stomach appears to be markedly distended with air, incompletely imaged. IMPRESSION: 1. Endotracheal tube appears well positioned with tip above the level of the carina. 2. Patchy bibasilar airspace opacities, atelectasis versus pneumonia. Probable small left pleural  effusion. 3. Stomach appears to  be markedly distended with air, incompletely imaged. Consider confirmation/further characterization with plain film of the abdomen. These results will be called to the ordering clinician or representative by the Radiologist Assistant, and communication documented in the PACS or zVision Dashboard. Electronically Signed   By: Franki Cabot M.D.   On: 03/10/2017 07:59   Dg Chest Port 1 View  Result Date: 03/11/2017 CLINICAL DATA:  Respiratory distress. EXAM: PORTABLE CHEST 1 VIEW COMPARISON:  Chest CT 10/20/2016 FINDINGS: Midthoracic fusion and corpectomy Low lung volumes. Lung metastasis not well identified. One nodule noted LEFT lung base. For low lung volumes. No focal infiltrate. Basilar atelectasis on LEFT. IMPRESSION: LEFT basilar atelectasis.  No evidence pneumonia.  Mild congestion. Pulmonary metastasis under appreciated on radiograph Electronically Signed   By: Suzy Bouchard M.D.   On: 03/05/2017 23:35    EKG:   Orders placed or performed during the hospital encounter of 03/04/2017  . EKG 12-Lead  . EKG 12-Lead  . EKG 12-Lead  . EKG 12-Lead    ASSESSMENT AND PLAN:   61 year old male with past medical history significant for metastatic left renal cell carcinoma, hypertension, anxiety, congestive heart failure presents to hospital secondary to altered mental status.  #1 sepsis-secondary to urinary tract infection -Blood cultures urine cultures are pending. -Pro calcitonin and significantly elevated and also lactic acid. -Will be started on phenylephrine for hypotension at this time. Continue broad-spectrum antibiotics and manage in ICU  #2 acute renal failure-renal function was normal last week, renal failure is acute with creatinine of 3.4 on adm -Still making urine, no acute indication for dialysis. -Nephrology consulted. Renal ultrasound ordered to rule out any obstruction. If noted, will need urology consult  #3 acute respiratory failure- failed  BiPAP. Secondary to sepsis -Currently intubated and on ventilator. -Management per respiratory. Follow-up x-ray  #4 metastatic renal cell carcinoma-following with oncology. Diagnosed in 2015 left renal cell carcinoma -Has lung metastatic disease. 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 -Was on chemotherapy with cabozantinib-which has been held for the last 2 weeks. -Also on chronic pain medications with MS Contin and oxycodone  #5 DVT prophylaxis-on subcutaneous heparin   Critically ill. Discussed with nephrologist and intensivist   All the records are reviewed and case discussed with Care Management/Social Workerr. Management plans discussed with the patient, family and they are in agreement.  CODE STATUS: Full code  TOTAL TIME TAKING CARE OF THIS PATIENT: 39 minutes.   POSSIBLE D/C IN ? DAYS, DEPENDING ON CLINICAL CONDITION.   Gladstone Lighter M.D on 03/10/2017 at 9:45 AM  Between 7am to 6pm - Pager - [phone]  After 6pm go to [email or web address] - password [email or web address] [facility]  Office  [phone]  CC: Primary care physician; [name], MD

## 2017-03-10 NOTE — Consult Note (Signed)
[facility] CONSULT NOTE    Date: 03/10/2017                  Patient Name:  Marc Blake  MRN: [id number]  DOB: [birth year]  Age / Sex: 61 y.o., male         PCP: [name], MD                 Service Requesting Consult: Pulmonary/Critical care                 Reason for Consult: Acute renal failure            History of Present Illness: Patient is a 61 y.o. male with a PMHx of GERD, anxiety, congestive heart failure, depression, hyperlipidemia, hypertension, myocardial infarction, renal cell carcinoma on active chemotherapy, who was admitted to [facility] on 03/18/2017 for evaluation of sepsis thought to be secondary to pneumonia. Patient unable to offer any history at this point in time. He is seen in the critical care unit. He is currently on BiPAP. Code sepsis was initiated. He is on broad-spectrum antibiotics and he has been also started on IV fluid hydration.  We are asked to see him for acute renal failure. His baseline creatinine is 0.94 on 01/19/2017. His creatinine peaked at 3.47 and is currently down to 3.02. Urine output did appear to be adequate over the preceding 24 hours. Pulmonary/medical care is currently considering intubation.   Medications: Outpatient medications: Facility-Administered Medications Prior to Admission  Medication Dose Route Frequency Provider Last Rate Last Dose  . 0.9 %  sodium chloride infusion   Intravenous Once [name], MD       Prescriptions Prior to Admission  Medication Sig Dispense Refill Last Dose  . amLODipine (NORVASC) 10 MG tablet Take 10 mg by mouth daily.  3 Taking  . atorvastatin (LIPITOR) 20 MG tablet TAKE 1 TABLET ONCE A DAY (AT BEDTIME) FOR 30 DAYS  2 Taking  . busPIRone (BUSPAR) 7.5 MG tablet    Taking  . carvedilol (COREG) 12.5 MG tablet TAKE 1 TABLET BY MOUTH TWICE A DAY FOR 30 DAYS  3 Taking  . [employer]S GENTLE LAXATIVE 10 MG suppository INSERT 1 SUPPOSITORY RECTALLY EVERY 24HRS AS NEEDED FOR  CONSTIPATION  0 Taking  . [facility]S STOOL SOFTENER 8.6-50 MG tablet TAKE 1 TABLET BY MOUTH TWICE A DAY AS NEEDED CONSTIPATION  0 Taking  . fluocinonide cream (LIDEX) 0.05 %    Taking  . gabapentin (NEURONTIN) 300 MG capsule Take 300 mg by mouth 3 (three) times daily.    Taking  . ibuprofen (ADVIL,MOTRIN) 200 MG tablet Take 200 mg by mouth every 8 (eight) hours as needed for moderate pain. 4 every 8 hours   Taking  . ipratropium-albuterol (DUONEB) 0.5-2.5 (3) MG/3ML SOLN    Taking  . lisinopril (PRINIVIL,ZESTRIL) 10 MG tablet    Taking  . LORazepam (ATIVAN) 0.5 MG tablet Take 1 tablet (0.5 mg total) by mouth every 8 (eight) hours. 45 tablet 0 Taking  . morphine (MS CONTIN) 30 MG 12 hr tablet Take 1 tablet (30 mg total) by mouth every 12 (twelve) hours. 60 tablet 0 Taking  . oxyCODONE (OXY IR/ROXICODONE) 5 MG immediate release tablet Take 1 tablet (5 mg total) by mouth every 6 (six) hours as needed (every 6 h prn). 90 tablet 0 Taking  . potassium chloride SA (K-DUR,KLOR-CON) 20 MEQ tablet Take 1 tablet (20 mEq total) by mouth daily. For 3 days  30 tablet 0 Taking  . senna (SENOKOT) 8.6 MG tablet Take 2 tablets by mouth at bedtime.    Taking  . sertraline (ZOLOFT) 100 MG tablet Take 100 mg by mouth at bedtime.  1 Taking  . tenofovir (VIREAD) 300 MG tablet Take 1 tablet (300 mg total) by mouth daily. 30 tablet 11 Taking  . traZODone (DESYREL) 50 MG tablet Take 50 mg by mouth at bedtime.     . triamcinolone cream (KENALOG) 0.5 % Apply topically 2 (two) times daily. 30 g 1 Taking  . XARELTO 20 MG TABS tablet Take 20 mg by mouth daily.  1 Taking    Current medications: Current Facility-Administered Medications  Medication Dose Route Frequency Provider Last Rate Last Dose  . fentaNYL (SUBLIMAZE) 100 MCG/2ML injection           . midazolam (VERSED) 2 MG/2ML injection           . vecuronium (NORCURON) 10 MG injection           . 0.9 %  sodium chloride infusion   Intravenous Continuous Lance Coon, MD 150  mL/hr at 03/10/17 0600    . acetaminophen (TYLENOL) tablet 650 mg  650 mg Oral Q6H PRN Lance Coon, MD       Or  . acetaminophen (TYLENOL) suppository 650 mg  650 mg Rectal Q6H PRN Lance Coon, MD      . bisacodyl (DULCOLAX) suppository 10 mg  10 mg Rectal Daily PRN Mikael Spray, NP      . chlorhexidine gluconate (MEDLINE KIT) (PERIDEX) 0.12 % solution 15 mL  15 mL Mouth Rinse BID Tukov, Magadalene S, NP      . fentaNYL (SUBLIMAZE) bolus via infusion 50-100 mcg  50-100 mcg Intravenous Q1H PRN Tukov, Magadalene S, NP      . fentaNYL (SUBLIMAZE) injection 50-100 mcg  50-100 mcg Intravenous Once Tukov, Magadalene S, NP      . fentaNYL 259mg in NS 2542m(1016mml) infusion-PREMIX  25-400 mcg/hr Intravenous Continuous Tukov, Magadalene S, NP      . heparin injection 5,000 Units  5,000 Units Subcutaneous Q8HCamelia PheneslLance CoonD   5,000 Units at 03/10/17 0515  . ipratropium-albuterol (DUONEB) 0.5-2.5 (3) MG/3ML nebulizer solution 3 mL  3 mL Nebulization Q6H Tukov, Magadalene S, NP      . MEDLINE mouth rinse  15 mL Mouth Rinse QID Tukov, Magadalene S, NP      . midazolam (VERSED) injection 2-4 mg  2-4 mg Intravenous Q15 min PRN Tukov, Magadalene S, NP      . midazolam (VERSED) injection 2-4 mg  2-4 mg Intravenous Q2H PRN Tukov, Magadalene S, NP      . ondansetron (ZOFRAN) tablet 4 mg  4 mg Oral Q6H PRN WilLance CoonD       Or  . ondansetron (ZOGastroenterology Specialists Incnjection 4 mg  4 mg Intravenous Q6H PRN WilLance CoonD      . pantoprazole (PROTONIX) injection 40 mg  40 mg Intravenous Daily Tukov, Magadalene S, NP      . piperacillin-tazobactam (ZOSYN) IVPB 3.375 g  3.375 g Intravenous Q8H ForHinda KehrD 12.5 mL/hr at 03/10/17 0514 3.375 g at 03/10/17 0514  . sennosides (SENOKOT) 8.8 MG/5ML syrup 5 mL  5 mL Per Tube BID PRN Tukov, Magadalene S, NP      . sodium chloride 0.9 % bolus 1,000 mL  1,000 mL Intravenous Q1H Tukov, Magadalene S, NP 1,000 mL/hr at 03/10/17 0702 1,000 mL at 03/10/17 0702  .  vancomycin (VANCOCIN) 1,500 mg in sodium chloride 0.9 % 500 mL IVPB  1,500 mg Intravenous Q36H Hinda Kehr, MD 250 mL/hr at 03/10/17 0515 1,500 mg at 03/10/17 0515      Allergies: No Known Allergies    Past Medical History: Past Medical History:  Diagnosis Date  . Acid reflux   . Anxiety   . Arthritis   . CHF (congestive heart failure) ([place])   . Depression   . Depression   . Hyperlipidemia   . Hypertension   . Myocardial infarction ([place])   . Renal cancer ([place])   . Renal cell carcinoma ([place])   . Renal insufficiency   . Skin cancer      Past Surgical History: Past Surgical History:  Procedure Laterality Date  . BACK SURGERY    . CARDIAC CATHETERIZATION     with stent  . CYSTOSCOPY WITH BIOPSY  03/06/2016   Procedure: CYSTOSCOPY WITH BIOPSY;  Surgeon: [name], MD;  Location: [facility] ORS;  Service: Urology;;  . [name] WITH URETHRAL DILATATION N/A 03/06/2016   Procedure: CYSTOSCOPY WITH URETHRAL DILATATION WITH CATHETER PLACEMENT;  Surgeon: [name], MD;  Location: [facility] ORS;  Service: Urology;  Laterality: N/A;  . KNEE SURGERY Left   . LEG SURGERY Left   . TONSILLECTOMY       Family History: Family History  Problem Relation Age of Onset  . Hypertension Father   . Heart attack Father   . Stroke Sister   . Hypertension Brother   . Stroke Maternal Uncle      Social History: Social History   Social History  . Marital status: Married    Spouse name: N/A  . Number of children: N/A  . Years of education: N/A   Occupational History  . Not on file.   Social History Main Topics  . Smoking status: Never Smoker  . Smokeless tobacco: Never Used  . 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 Drug use: No  . Sexual activity: Not on file   Other Topics Concern  . Not on file   Social History Narrative  . No narrative on file     Review of Systems: Review of Systems  Unable to perform ROS: Critical illness     Vital  Signs: Blood pressure (!) 92/57, pulse (!) 134, temperature 99.5 F (37.5 C), temperature source Core (Comment), resp. rate 20, height _0  (1.905 m), weight 95.9 kg (211 lb 6.7 oz), SpO2 100 %.  Weight trends: Filed Weights   02/25/2017 2337 03/10/17 0200  Weight: 95.3 kg (210 lb) 95.9 kg (211 lb 6.7 oz)    Physical Exam: General: Critically ill appearing  Head: Normocephalic, atraumatic.  Eyes: Anicteric  Nose: Mucous membranes moist, not inflammed, nonerythematous.  Throat: bipap facemask on, therefore can't examine throat  Neck: Supple, trachea midline.  Lungs:  Increased work of breathing, scattered rhonchi bilateral  Heart: S1S2 tachycardic  Abdomen:  Soft, NTND, BS present  Extremities: No pretibial edema.  Neurologic: Awake, but not following any commands  Skin: No visible rashes, scars.    Lab results: Basic Metabolic Panel:  Recent Labs Lab 02/17/2017 2220 03/10/17 0244  NA 139 139  K 4.2 4.1  CL 102 104  CO2 23 23  GLUCOSE 160* 153*  BUN 86* 90*  CREATININE 3.47* 3.02*  CALCIUM 9.3 9.0  MG  --  2.0  PHOS  --  3.6    Liver Function Tests:  Recent Labs  Lab 02/28/2017 2220  AST 39  ALT 19  ALKPHOS 94  BILITOT 0.7  PROT 6.3*  ALBUMIN 2.4*    Recent Labs Lab 03/12/2017 2220  LIPASE 11   No results for input(s): AMMONIA in the last 168 hours.  CBC:  Recent Labs Lab 02/19/2017 2220 03/10/17 0244  WBC 7.6 5.1  NEUTROABS 6.5  --   HGB 11.8* 11.1*  HCT 35.5* 33.3*  MCV 90.2 88.7  PLT 116* 116*    Cardiac Enzymes:  Recent Labs Lab 03/02/2017 2220 03/10/17 0245  TROPONINI 0.24* 0.29*    BNP: Invalid input(s): POCBNP  CBG:  Recent Labs Lab 03/10/17 0210  GLUCAP 162*    Microbiology: Results for orders placed or performed during the hospital encounter of 03/07/2017  Blood Culture (routine x 2)     Status: None (Preliminary result)   Collection Time: 03/04/2017 10:10 PM  Result Value Ref Range Status   Specimen Description BLOOD  RIGHT ANTECUBITAL  Final   Special Requests   Final    BOTTLES DRAWN AEROBIC AND ANAEROBIC Blood Culture adequate volume   Culture NO GROWTH < 12 HOURS  Final   Report Status PENDING  Incomplete  Blood Culture (routine x 2)     Status: None (Preliminary result)   Collection Time: 02/28/2017 10:30 PM  Result Value Ref Range Status   Specimen Description BLOOD BLOOD RIGHT ARM  Final   Special Requests   Final    BOTTLES DRAWN AEROBIC AND ANAEROBIC Blood Culture results may not be optimal due to an excessive volume of blood received in culture bottles   Culture NO GROWTH < 12 HOURS  Final   Report Status PENDING  Incomplete  MRSA PCR Screening     Status: None   Collection Time: 03/10/17  2:22 AM  Result Value Ref Range Status   MRSA by PCR NEGATIVE NEGATIVE Final    Comment:        The GeneXpert MRSA Assay (FDA approved for NASAL specimens only), is one component of a comprehensive MRSA colonization surveillance program. It is not intended to diagnose MRSA infection nor to guide or monitor treatment for MRSA infections.     Coagulation Studies:  Recent Labs  03/02/2017 2220  LABPROT 17.0*  INR 1.40    Urinalysis:  Recent Labs  03/10/17 0030  COLORURINE AMBER*  LABSPEC 1.010  PHURINE 7.0  GLUCOSEU NEGATIVE  HGBUR MODERATE*  BILIRUBINUR NEGATIVE  KETONESUR NEGATIVE  PROTEINUR 100*  NITRITE NEGATIVE  LEUKOCYTESUR LARGE*      Imaging: Dg Chest Port 1 View  Result Date: 03/10/2017 CLINICAL DATA:  Respiratory distress. EXAM: PORTABLE CHEST 1 VIEW COMPARISON:  Chest CT 10/20/2016 FINDINGS: Midthoracic fusion and corpectomy Low lung volumes. Lung metastasis not well identified. One nodule noted LEFT lung base. For low lung volumes. No focal infiltrate. Basilar atelectasis on LEFT. IMPRESSION: LEFT basilar atelectasis.  No evidence pneumonia.  Mild congestion. Pulmonary metastasis under appreciated on radiograph Electronically Signed   By: Suzy Bouchard M.D.   On:  03/01/2017 23:35      Assessment & Plan: Pt is a 62 y.o. male  with a PMHx of GERD, anxiety, congestive heart failure, depression, hyperlipidemia, hypertension, myocardial infarction, renal cell carcinoma on active chemotherapy, who was admitted to [facility] on 03/06/2017 for evaluation of sepsis thought to be secondary to pneumonia.   1.  Acute renal failure, ? Secondary to sepsis, but also has hx of GU cancer. 2.  Acute respiratory failure.  3.  Anemia  unspecified. 4.  Renal cell carcinoma.    Plan:  Patient was admitted to [facility] with code sepsis.  He is now found to have acute renal failure along with acute respiratory failure. No recent imaging performed. Therefore we recommend obtaining renal ultrasound to make sure there is no underlying obstruction now. If up structure and is noted we would recommend urology consultation. Otherwise continue supportive care now. Urine output was 2.2 L over the preceding 24 hours.  It appears that the patient may be intubated this a.m. as his CODE STATUS is full. Patient appears to have a guarded prognosis at this time. Thanks for consultation.

## 2017-03-10 NOTE — ED Notes (Signed)
RT here to place pt on bipap

## 2017-03-10 NOTE — H&P (Signed)
Marc Blake at [place] NAME: Marc Blake    MR#:  [id number]  DATE OF BIRTH:  Nov 13, 1955  DATE OF ADMISSION:    PRIMARY CARE PHYSICIAN: Marc Body, MD   REQUESTING/REFERRING PHYSICIAN: Karma Greaser, MD  CHIEF COMPLAINT:   Chief Complaint  Patient presents with  . Code Sepsis    HISTORY OF PRESENT ILLNESS:  Marc Blake  is a 61 y.o. male who presents with Sepsis due to pneumonia. Patient is a cancer patient on active chemotherapy. He was found to home today confused and hypoxic. Here in the ED he has an elevated lactate, new AKI, demand ischemia. He was treated initially per sepsis protocol and hospitalists were called for admission  PAST MEDICAL HISTORY:   Past Medical History:  Diagnosis Date  . Acid reflux   . Anxiety   . Arthritis   . CHF (congestive heart failure) ([place])   . Depression   . Depression   . Hyperlipidemia   . Hypertension   . Myocardial infarction ([place])   . Renal cancer ([employer])   . Renal cell carcinoma ([place])   . Renal insufficiency   . Skin cancer     PAST SURGICAL HISTORY:   Past Surgical History:  Procedure Laterality Date  . BACK SURGERY    . CARDIAC CATHETERIZATION     with stent  . CYSTOSCOPY WITH BIOPSY  03/06/2016   Procedure: CYSTOSCOPY WITH BIOPSY;  Surgeon: [name], MD;  Location: [facility] ORS;  Service: Urology;;  . [name] WITH URETHRAL DILATATION N/A 03/06/2016   Procedure: CYSTOSCOPY WITH URETHRAL DILATATION WITH CATHETER PLACEMENT;  Surgeon: [name], MD;  Location: [facility] ORS;  Service: Urology;  Laterality: N/A;  . KNEE SURGERY Left   . LEG SURGERY Left   . TONSILLECTOMY      SOCIAL HISTORY:   Social History  Substance Use Topics  . Smoking status: Never Smoker  . Smokeless tobacco: Never Used  . 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 Hypertension Father   .  Heart attack Father   . Stroke Sister   . Hypertension Brother   . Stroke Maternal Uncle     DRUG ALLERGIES:  No Known Allergies  MEDICATIONS AT HOME:   Prior to Admission medications   Medication Sig Start Date End Date Taking? Authorizing Provider  amLODipine (NORVASC) 10 MG tablet Take 10 mg by mouth daily. 07/06/16   [provider]  atorvastatin (LIPITOR) 20 MG tablet TAKE 1 TABLET ONCE A DAY (AT BEDTIME) FOR 30 DAYS 11/26/15   [provider]  busPIRone (BUSPAR) 7.5 MG tablet  07/12/16   [provider]  carvedilol (COREG) 12.5 MG tablet TAKE 1 TABLET BY MOUTH TWICE A DAY FOR 30 DAYS 10/18/15   [provider]  CVS GENTLE LAXATIVE 10 MG suppository INSERT 1 SUPPOSITORY RECTALLY EVERY 24HRS AS NEEDED FOR CONSTIPATION 07/21/16   [provider]  CVS STOOL SOFTENER 8.6-50 MG tablet TAKE 1 TABLET BY MOUTH TWICE A DAY AS NEEDED CONSTIPATION 07/21/16   [provider]  fluocinonide cream (LIDEX) 0.05 %  07/21/16   [provider]  gabapentin (NEURONTIN) 300 MG capsule Take 300 mg by mouth 3 (three) times daily.     [provider]  ibuprofen (ADVIL,MOTRIN) 200 MG tablet Take 200 mg by mouth every 8 (eight) hours as needed for  moderate pain. 4 every 8 hours    [provider]  ipratropium-albuterol (DUONEB) 0.5-2.5 (3) MG/3ML SOLN  07/21/16   [provider]  lisinopril (PRINIVIL,ZESTRIL) 10 MG tablet  07/21/16   [provider]  LORazepam (ATIVAN) 0.5 MG tablet Take 1 tablet (0.5 mg total) by mouth every 8 (eight) hours. 02/22/17   [name], MD  morphine (MS CONTIN) 30 MG 12 hr tablet Take 1 tablet (30 mg total) by mouth every 12 (twelve) hours. 02/23/17   [name], MD  oxyCODONE (OXY IR/ROXICODONE) 5 MG immediate release tablet Take 1 tablet (5 mg total) by mouth every 6 (six) hours as needed (every 6 h prn). 02/23/17   [name], MD  potassium chloride SA (K-DUR,KLOR-CON) 20 MEQ  tablet Take 1 tablet (20 mEq total) by mouth daily. For 3 days 10/17/16   [name], MD  senna (SENOKOT) 8.6 MG tablet Take 2 tablets by mouth at bedtime.     [provider]  sertraline (ZOLOFT) 100 MG tablet Take 100 mg by mouth at bedtime. 11/13/15   [provider]  tenofovir (VIREAD) 300 MG tablet Take 1 tablet (300 mg total) by mouth daily. 09/13/16   Lucilla Lame, MD  traZODone (DESYREL) 50 MG tablet Take 50 mg by mouth at bedtime.  11/26/15   [provider]  triamcinolone cream (KENALOG) 0.5 % Apply topically 2 (two) times daily. 09/19/16 09/19/17  Lloyd Huger, MD  XARELTO 20 MG TABS tablet Take 20 mg by mouth daily. 11/16/15   [provider]    REVIEW OF SYSTEMS:  Review of Systems  Unable to perform ROS: Acuity of condition     VITAL SIGNS:   Vitals:   03/10/17 0020 03/10/17 0024 03/10/17 0025 03/10/17 0027  BP: 101/69     Pulse: (!) 118   (!) 117  Resp: (!) 24 (!) 23 (!) 21 (!) 24  Temp: 98.4 F (36.9 C) 98.2 F (36.8 C) 98.2 F (36.8 C) 98.1 F (36.7 C)  TempSrc:      SpO2: 97%   97%  Weight:      Height:       Wt Readings from Last 3 Encounters:  02/17/2017 95.3 kg (210 lb)  03/02/17 97.7 kg (215 lb 6.4 oz)  02/02/17 97.2 kg (214 lb 4.8 oz)    PHYSICAL EXAMINATION:  Physical Exam  Vitals reviewed. Constitutional: He appears well-developed and well-nourished. No distress.  HENT:  Head: Normocephalic and atraumatic.  Mouth/Throat: Oropharynx is clear and moist.  Eyes: Pupils are equal, round, and reactive to light. Conjunctivae and EOM are normal. No scleral icterus.  Neck: Normal range of motion. Neck supple. No JVD present. No thyromegaly present.  Cardiovascular: Normal rate, regular rhythm and intact distal pulses.  Exam reveals no gallop and no friction rub.   No murmur heard. Respiratory: He is in respiratory distress. He has no wheezes. He has no rales.  Coarse breath sounds  GI: Soft. Bowel sounds are  normal. He exhibits no distension. There is no tenderness.  Musculoskeletal: Normal range of motion. He exhibits no edema.  No arthritis, no gout  Lymphadenopathy:    He has no cervical adenopathy.  Neurological: He is alert. No cranial nerve deficit.  Patient is alert but clearly confused and unable to follow commands very well  Skin: Skin is warm and dry. No rash noted. No erythema.  Psychiatric:  Unable to assess due to patient condition  LABORATORY PANEL:   CBC  Recent Labs Lab 02/23/2017 2220  WBC 7.6  HGB 11.8*  HCT 35.5*  PLT 116*   ------------------------------------------------------------------------------------------------------------------  Chemistries   Recent Labs Lab 02/21/2017 2220  NA 139  K 4.2  CL 102  CO2 23  GLUCOSE 160*  BUN 86*  CREATININE 3.47*  CALCIUM 9.3  AST 39  ALT 19  ALKPHOS 94  BILITOT 0.7   ------------------------------------------------------------------------------------------------------------------  Cardiac Enzymes  Recent Labs Lab 02/28/2017 2220  TROPONINI 0.24*   ------------------------------------------------------------------------------------------------------------------  RADIOLOGY:  Dg Chest Port 1 View  Result Date: 03/12/2017 CLINICAL DATA:  Respiratory distress. EXAM: PORTABLE CHEST 1 VIEW COMPARISON:  Chest CT 10/20/2016 FINDINGS: Midthoracic fusion and corpectomy Low lung volumes. Lung metastasis not well identified. One nodule noted LEFT lung base. For low lung volumes. No focal infiltrate. Basilar atelectasis on LEFT. IMPRESSION: LEFT basilar atelectasis.  No evidence pneumonia.  Mild congestion. Pulmonary metastasis under appreciated on radiograph Electronically Signed   By: Suzy Bouchard M.D.   On: 02/23/2017 23:35    EKG:   Orders placed or performed during the hospital encounter of 03/11/2017  . EKG 12-Lead  . EKG 12-Lead    IMPRESSION AND PLAN:  Principal Problem:   Severe sepsis ([place]) -  broad IV antibiotics started. Lactic acid was significantly elevated, IV fluids and place and we will trend lactate into within normal limits, blood pressure improves some with initial IV fluid administration, cultures sent Active Problems:   CAD (coronary artery disease) - continue home meds   Elevated troponin - likely demand ischemia in the setting of known CAD and heart failure, we will trend his enzymes   HCAP (healthcare-associated pneumonia) - IV antibiotics and cultures as above   AKI (acute kidney injury) ([place]) - IV fluids as above, avoid nephrotoxins and monitor for improvement   Acute respiratory failure with hypoxia and hypercapnia (H[facility]) - patient is currently on BiPAP, we'll admit him to the ICU   Renal cell carcinoma ([place]) - actively on chemotherapy   Essential hypertension - stable, given that his blood pressure was low and is just now recovering we will hold antihypertensives for now  All the records are reviewed and case discussed with ED provider. Management plans discussed with the patient and/or family.  DVT PROPHYLAXIS: SubQ heparin  GI PROPHYLAXIS: None  ADMISSION STATUS: Inpatient  CODE STATUS: Full Code Status History    Date Active Date Inactive Code Status Order ID Comments User Context   02/15/2016  1:05 AM 02/16/2016  1:27 PM Full Code [id number]  Lance Coon, MD Inpatient      TOTAL CRITICAL CARE TIME TAKING CARE OF THIS PATIENT: 50 minutes.   Catlin Doria FIELDING 03/10/2017, 12:48 AM  [facility]  Office  [phone]  CC: Primary care physician; Marc Body, MD  Note:  This document was prepared using Dragon voice recognition software and may include unintentional dictation errors.

## 2017-03-10 NOTE — ED Notes (Signed)
Awaiting RT for pt transport to CCU.

## 2017-03-10 NOTE — Progress Notes (Signed)
Pharmacy Antibiotic Note  Marc Blake is a 61 y.o. male admitted on 02/23/2017 with sepsis.  Pharmacy has been consulted for vancomycin and Zosyn dosing.  Plan: DW 95 kg  Vd 67L kei 0.027 hr-1  T1/2 26 hours Vancomycin 1500 mg q 36 hours ordered. Not candidate for stacked dosing. Level before 4th overall dose. Goal trough 15-20.  Patients SCr is significantly elevated over baseline so this regimen will likely need to be changed as renal function improves.  Zosyn 3.375 grams q 8 hours ordered.  Height: 6\' 3"  (190.5 cm) Weight: 210 lb (95.3 kg) IBW/kg (Calculated) : 84.5  Temp (24hrs), Avg:99 F (37.2 C), Min:98.1 F (36.7 C), Max:99.8 F (37.7 C)   Recent Labs Lab 03/05/2017 2220  CREATININE 3.47*  LATICACIDVEN 5.2*    Estimated Creatinine Clearance: 26.7 mL/min (A) (by C-G formula based on SCr of 3.47 mg/dL (H)).    No Known Allergies  Antimicrobials this admission: Vancomycin, Zosyn 9/21  >>    >>   Dose adjustments this admission:   Microbiology results: 9/21 BCx: pending 9/21 UCx: pending       9/21 CXR: No evidence pneumonia 9/21 UA: pending  Thank you for allowing pharmacy to be a part of this patient's care.  Kloi Brodman S 03/10/2017 12:35 AM

## 2017-03-10 NOTE — ED Notes (Signed)
Pt remains confused, thinks it is January of 1999.

## 2017-03-10 NOTE — Procedures (Signed)
Central Venous Catheter Placement: Indication: Patient receiving vesicant or irritant drug.; Patient receiving intravenous therapy for longer than 5 days.; Patient has limited or no vascular access.   Consent:emergent.   Risks and benefits explained in detail including risk of infection, bleeding, respiratory failure and death..   Hand washing performed prior to starting the procedure.   Procedure: An active timeout was performed and correct patient, name, & ID confirmed.  After explaining risk and benefits, patient was positioned correctly for central venous access. Patient was prepped using strict sterile technique including chlorohexadine preps, sterile drape, sterile gown and sterile gloves.  The area was prepped, draped and anesthetized in the usual sterile manner. Patient comfort was obtained.  A triple lumen catheter was placed in right subclavian Vein There was good blood return, catheter caps were placed on lumens, catheter flushed easily, the line was secured and a sterile dressing and BIO-PATCH applied.     Number of Attempts: 1 Complications:none Estimated Blood Loss: none Chest Radiograph indicated and ordered.  Operator: Marda Stalker, M.D.   Marda Stalker, M.D.  Pulmonary & Critical Care Medicine Intensive Care Unit

## 2017-03-10 NOTE — Consult Note (Signed)
CHAMP autoconsult - covering for Dr. Ola Spurr:  MSSA bacteremia in this patient with renal cell carcinoma currently on chemotherapy.  Came in altered.  MSSA in 2/2 blood cultures.  On pressor support. Possible urinary source with history of procedures.  No lines documented.  Intubated now.    On appropriate therapy with nafcillin.  Can also use cefazolin. If full aggressive care desired will need echocardiogram for ? Endocarditis, at some point.   Repeat blood cultures in 1-2 days.  Will need a picc line when blood cultures show clearance.  Dr. Ola Spurr back on Tuesday, call with any changes or questions prior to that   Scharlene Gloss, MD

## 2017-03-10 NOTE — Progress Notes (Signed)
   03/10/17 0545  Vitals  Temp 99.1 F (37.3 C)  Temp Source Core  BP (!) 89/69  MAP (mmHg) 74  BP Location Right Arm  BP Method Automatic  Patient Position (if appropriate) Lying  Pulse Rate (!) 133  Pulse Rate Source Monitor  ECG Heart Rate (!) 133  Cardiac Rhythm ST  Resp (!) 23  Oxygen Therapy  SpO2 100 %  O2 Device Bi-PAP  FiO2 (%) 50 %  Pulse Oximetry Type Continuous   Notified Maggi, NP of pt's current condition.  Orders received.

## 2017-03-11 ENCOUNTER — Inpatient Hospital Stay: Payer: Medicaid Other

## 2017-03-11 LAB — BLOOD GAS, ARTERIAL
Acid-base deficit: 4.4 mmol/L — ABNORMAL HIGH (ref 0.0–2.0)
Bicarbonate: 21.7 mmol/L (ref 20.0–28.0)
FIO2: 0.3
MECHANICAL RATE: 18
MECHVT: 550 mL
O2 SAT: 96.9 %
PATIENT TEMPERATURE: 37
PCO2 ART: 43 mmHg (ref 32.0–48.0)
PEEP: 5 cmH2O
pH, Arterial: 7.31 — ABNORMAL LOW (ref 7.350–7.450)
pO2, Arterial: 98 mmHg (ref 83.0–108.0)

## 2017-03-11 LAB — COMPREHENSIVE METABOLIC PANEL
ALT: 21 U/L (ref 17–63)
AST: 45 U/L — AB (ref 15–41)
Albumin: 1.8 g/dL — ABNORMAL LOW (ref 3.5–5.0)
Alkaline Phosphatase: 68 U/L (ref 38–126)
Anion gap: 9 (ref 5–15)
BUN: 84 mg/dL — AB (ref 6–20)
CO2: 21 mmol/L — AB (ref 22–32)
CREATININE: 1.84 mg/dL — AB (ref 0.61–1.24)
Calcium: 8 mg/dL — ABNORMAL LOW (ref 8.9–10.3)
Chloride: 113 mmol/L — ABNORMAL HIGH (ref 101–111)
GFR calc Af Amer: 44 mL/min — ABNORMAL LOW (ref >60)
GFR, EST NON AFRICAN AMERICAN: 38 mL/min — AB (ref >60)
Glucose, Bld: 134 mg/dL — ABNORMAL HIGH (ref 65–99)
POTASSIUM: 3.7 mmol/L (ref 3.5–5.1)
Sodium: 143 mmol/L (ref 135–145)
Total Bilirubin: 1.8 mg/dL — ABNORMAL HIGH (ref 0.3–1.2)
Total Protein: 5.1 g/dL — ABNORMAL LOW (ref 6.5–8.1)

## 2017-03-11 LAB — GLUCOSE, CAPILLARY
GLUCOSE-CAPILLARY: 133 mg/dL — AB (ref 65–99)
GLUCOSE-CAPILLARY: 127 mg/dL — AB (ref 65–99)
Glucose-Capillary: 137 mg/dL — ABNORMAL HIGH (ref 65–99)

## 2017-03-11 LAB — HIV ANTIBODY (ROUTINE TESTING W REFLEX): HIV Screen 4th Generation wRfx: NONREACTIVE

## 2017-03-11 LAB — PROCALCITONIN: PROCALCITONIN: 15.64 ng/mL

## 2017-03-11 NOTE — Care Management Note (Addendum)
Case Management Note  Patient Details  Name: TARQUIN WELCHER MRN: [id number] Date of Birth: 1955/07/27  Subjective/Objective:     Discussed discharge planning with Mrs Neshawn Aird per her request. Per Mrs Lalone, she and Mr Chausse relocated one year ago to [facility] from [place] after they lost their home in [place] due to medical bills. Per Mrs Tober, Mr Camacho refused to allow her to try to obtain activities of daily living  assistance for him at home during the past year. Mr Coutts sees Dr Mike Gip at the [facility]. Wife reports that Palliative Care was following Mr Luzier in [place] and that she would like to have some kind of in-home assistance again. Mrs Noorani chose [facility] from a list of providers, and a referral for an evaluation was called and faxed to [facility]orel[facility]. Mrs Terry was also provided with a list of home health agencies. Mr Lecomte has metastatic kidney cancer and Mrs Parcel reports that she is having difficulty managing her husband at home as he continues to become weaker. Today Mr Thune remains intubated and has a central line. Mrs Hao will be at bedside on Monday after 10am.                Action/Plan:   Expected Discharge Date:                  Expected Discharge Plan:     In-House Referral:     Discharge planning Services     Post Acute Care Choice:    Choice offered to:     DME Arranged:    DME Agency:     HH Arranged:    HH Agency:     Status of Service:     If discussed at [employer] of Stay Meetings, dates discussed:    Additional Comments:  Lynnwood Beckford A, RN 03/11/2017, 2:53 PM

## 2017-03-11 NOTE — Progress Notes (Signed)
[facility] Progess Note    SYNOPSIS   61 year old male with metastatic renal cell carcinoma presenting with urosepsis, septic shock, acute respiratory failure and acute renal failure, septic shock.    ASSESSMENT/PLAN    PULMONARY A:acute respiratory failure, possible pneumonia. Vent dependent P:   -Weaning trial attempted today, however, the patient developed increased work of breathing, and increased accessory muscle use. -Continue current vent support.  VENTILATOR SETTINGS: Vent Mode: PRVC FiO2 (%):  [30 %] 30 % Set Rate:  [18 bmp-19 bmp] 18 bmp Vt Set:  [550 mL] 550 mL PEEP:  [0 cmH20-5 cmH20] 5 cmH20 Plateau Pressure:  [13 cmH20-17 cmH20] 17 cmH20  CARDIOVASCULAR A: septic shock with hypotension.UTI versus pneumonia P:  Continue to wean down pressors as tolerated. HEMODYNAMICS:    RENAL A:  Acute kidney inary to septic shock P:   Continue IV fluids. Appears improving. INTAKE / OUTPUT:  Intake/Output Summary (Last 24 hours) at 03/11/17 1056 Last data filed at 03/11/17 0900  Gross per 24 hour  Intake           1883.4 ml  Output             3850 ml  Net          -1966.6 ml    GASTROINTESTINAL A:   P:   Tube feeds. Continue GI prophylaxis.  HEMATOLOGIC A: left renal cancer with mets to lung and spine s/p multiple courses of chemo and multiple surgeries.  P:  --  INFECTIOUS A:  Septic shock. HBV, HCV positive.  P:    Micro/culture results:  BCx2 staph aureus UC staph aureus Sputum pending  Antibiotics: nafcillin  ENDOCRINE A: Glucose controlled.  P:   --  NEUROLOGIC A:  Sedated, on vent.  P:   --   MAJOR EVENTS/TEST RESULTS: 09/22; admitted.  09/23; failed weaning trial due to WOB.   Best Practices  DVT Prophylaxis: heparin SQ.  GI Prophylaxis: PPI   ---------------------------------------   ----------------------------------------   Name: Marc Blake MRN: [id number] DOB: 27-Jan-1956     ADMISSION DATE:  03/08/2017   SUBJECTIVE:   Pt currently on the ventilator, can not provide history or review of systems.   Review of Systems:  Can not provide ROS due to critical illness.    VITAL SIGNS: Temp:  [98.6 F (37 C)-99.9 F (37.7 C)] 99.5 F (37.5 C) (09/23 1000) Pulse Rate:  [101-124] 121 (09/23 1000) Resp:  [13-23] 23 (09/23 1000) BP: (86-115)/(49-74) 103/63 (09/23 1000) SpO2:  [97 %-100 %] 100 % (09/23 1000) FiO2 (%):  [30 %] 30 % (09/23 0835)     PHYSICAL EXAMINATION: Physical Examination:   VS: BP 103/63   Pulse (!) 121   Temp 99.5 F (37.5 C)   Resp (!) 23   Ht 6\' 3"  (1.905 m)   Wt 211 lb 6.7 oz (95.9 kg)   SpO2 100%   BMI 26.43 kg/m   General Appearance: No distress  Neuro:without focal findings, mental status reduced.  HEENT: PERRLA, EOM intact. Pulmonary: normal breath sounds   CardiovascularNormal S1,S2.  No m/r/g.   Abdomen: Benign, Soft, non-tender. Renal:  No costovertebral tenderness  GU:  Not performed at this time. Endocrine: No evident thyromegaly. Skin:   warm, no rashes, no ecchymosis  Extremities: normal, no cyanosis, clubbing.    LABORATORY PANEL:   CBC  Recent Labs Lab 03/10/17 0244  WBC 5.1  HGB 11.1*  HCT 33.3*  PLT 116*  Chemistries   Recent Labs Lab 03/10/17 0244  03/11/17 0515  NA 139  < > 143  K 4.1  < > 3.7  CL 104  < > 113*  CO2 23  < > 21*  GLUCOSE 153*  < > 134*  BUN 90*  < > 84*  CREATININE 3.02*  < > 1.84*  CALCIUM 9.0  < > 8.0*  MG 2.0  --   --   PHOS 3.6  --   --   AST  --   < > 45*  ALT  --   < > 21  ALKPHOS  --   < > 68  BILITOT  --   < > 1.8*  < > = values in this interval not displayed.   Recent Labs Lab 03/10/17 0210 03/10/17 2353 03/11/17 0636  GLUCAP 162* 115* 133*    Recent Labs Lab 03/10/17 0516 03/10/17 0900 03/11/17 0500  PHART 7.20* 7.23* 7.31*  PCO2ART 53* 43 43  PO2ART 63* 93 98    Recent Labs Lab 02/18/2017 2220 03/10/17 1537 03/11/17 0515   AST 39 56* 45*  ALT 19 23 21   ALKPHOS 94 76 68  BILITOT 0.7 0.8 1.8*  ALBUMIN 2.4* 1.8* 1.8*    Cardiac Enzymes  Recent Labs Lab 03/10/17 1537  TROPONINI 0.09*    RADIOLOGY:  Dg Chest 1 View  Result Date: 03/11/2017 CLINICAL DATA:  Dyspnea EXAM: CHEST 1 VIEW COMPARISON:  03/10/2017 FINDINGS: Cardiac shadow is stable. Postsurgical changes are again noted. Endotracheal tube and nasogastric catheter are seen in satisfactory position. Right subclavian central line is again seen and stable although the tip is obscured by the orthopedic hardware. The lungs again demonstrate bibasilar atelectatic changes right greater than left. No other focal abnormality is seen. IMPRESSION: Persistent basilar atelectasis. The remainder of the exam is stable from the prior study. Electronically Signed   By: Inez Catalina M.D.   On: 03/11/2017 07:39   US Renal  Result Date: 03/10/2017 CLINICAL DATA:  Acute renal failure. EXAM: RENAL / URINARY TRACT ULTRASOUND COMPLETE COMPARISON:  None. FINDINGS: Right Kidney: Length: 14.5 cm. The right kidney demonstrates a lobular contour and increased cortical echogenicity. No suspicious mass or hydronephrosis. Left Kidney: Length: 14.9 cm. The patient's known left renal mass measuring 9.4 x 5.7 x 8.4 cm is again identified. No hydronephrosis. Bladder: The bladder is decompressed with a Foley catheter. There is bladder wall thickening, also seen on the CT scan from May 2018. IMPRESSION: 1. Known left-sided renal malignancy. 2. Findings on the right are consistent with medical renal disease. No hydronephrosis bilaterally. 3. Decompressed thick walled bladder, similar in appearance since the Oct 20, 2016 CT scan. Electronically Signed   By: Dorise Bullion III M.D   On: 03/10/2017 11:24   Dg Chest Port 1 View  Addendum Date: 03/10/2017   ADDENDUM REPORT: 03/10/2017 10:39 ADDENDUM: The impression includes a typographical error. The impression should read: No pneumothorax following  central line placement on the right. The remainder of the exam is stable. Electronically Signed   By: Inez Catalina M.D.   On: 03/10/2017 10:39   Result Date: 03/10/2017 CLINICAL DATA:  Check central line placement EXAM: PORTABLE CHEST 1 VIEW COMPARISON:  03/10/2017 FINDINGS: Cardiac shadow is stable. Postoperative changes are noted in the midthoracic spine. Right subclavian central line is noted with catheter tip in the superior right atrium. No pneumothorax is seen. Endotracheal tube is again noted in satisfactory position. Mild bibasilar atelectasis is seen. IMPRESSION:  A pneumothorax following central line placement on the right. The remainder of the exam is stable. Electronically Signed: By: Inez Catalina M.D. On: 03/10/2017 09:13   Dg Chest Port 1 View  Result Date: 03/10/2017 CLINICAL DATA:  Acute respiratory failure. Encounter for intubation. EXAM: PORTABLE CHEST 1 VIEW COMPARISON:  Chest x-ray dated 02/20/2017. FINDINGS: Endotracheal tube appears well positioned with tip approximately 2.5 cm above the carina. Patchy bibasilar airspace opacities, atelectasis versus pneumonia. Probable small left pleural effusion. No pneumothorax seen. Heart size and mediastinal contours appear stable. No acute or suspicious osseous finding. Stomach appears to be markedly distended with air, incompletely imaged. IMPRESSION: 1. Endotracheal tube appears well positioned with tip above the level of the carina. 2. Patchy bibasilar airspace opacities, atelectasis versus pneumonia. Probable small left pleural effusion. 3. Stomach appears to be markedly distended with air, incompletely imaged. Consider confirmation/further characterization with plain film of the abdomen. These results will be called to the ordering clinician or representative by the Radiologist Assistant, and communication documented in the PACS or zVision Dashboard. Electronically Signed   By: Franki Cabot M.D.   On: 03/10/2017 07:59   Dg Chest Port 1  View  Result Date: 02/21/2017 CLINICAL DATA:  Respiratory distress. EXAM: PORTABLE CHEST 1 VIEW COMPARISON:  Chest CT 10/20/2016 FINDINGS: Midthoracic fusion and corpectomy Low lung volumes. Lung metastasis not well identified. One nodule noted LEFT lung base. For low lung volumes. No focal infiltrate. Basilar atelectasis on LEFT. IMPRESSION: LEFT basilar atelectasis.  No evidence pneumonia.  Mild congestion. Pulmonary metastasis under appreciated on radiograph Electronically Signed   By: Suzy Bouchard M.D.   On: 03/10/2017 23:35   Dg Abd Portable 1v  Result Date: 03/10/2017 CLINICAL DATA:  Encounter for orogastric tube placement. EXAM: PORTABLE ABDOMEN - 1 VIEW COMPARISON:  None. FINDINGS: Orogastric tube is in the left upper abdomen and likely within the stomach body. The stomach is distended with gas. Surgical hardware in the thoracic spine. There is a central venous catheter with the tip near the right atrium. Left basilar chest densities. IMPRESSION: Orogastric tube is in the stomach.  Gaseous distention of stomach. Central line tip is near the right atrium. Electronically Signed   By: Markus Daft M.D.   On: 03/10/2017 11:49        --Marda Stalker, MD.  ICU Pager: [phone] [facility] Office Number: [phone]   03/11/2017   Critical Care Attestation.  I have personally obtained a history, examined the patient, evaluated laboratory and imaging results, formulated the assessment and plan and placed orders. The Patient requires high complexity decision making for assessment and support, frequent evaluation and titration of therapies, application of advanced monitoring technologies and extensive interpretation of multiple databases. The patient has critical illness that could lead imminently to failure of 1 or more organ systems and requires the highest level of physician preparedness to intervene.  Critical Care Time devoted to patient care services  described in this note is 35 minutes and is exclusive of time spent in procedures supervisory time of NP.

## 2017-03-11 NOTE — Progress Notes (Signed)
[facility] [place] NAME: Marc Blake    MR#:  [id number]  DATE OF BIRTH:  1956-04-30  SUBJECTIVE:  CHIEF COMPLAINT:   Chief Complaint  Patient presents with  . Code Sepsis   -Patient with left renal cell carcinoma with metastatic disease, admitted with altered mental status and respiratory failure. -remains intubated and sedated. Renal function improving  REVIEW OF SYSTEMS:  Review of Systems  Unable to perform ROS: Critical illness    DRUG ALLERGIES:  No Known Allergies  VITALS:  Blood pressure 112/70, pulse (!) 108, temperature 99.5 F (37.5 C), resp. rate 18, height 6\' 3"  (1.905 m), weight 95.9 kg (211 lb 6.7 oz), SpO2 100 %.  PHYSICAL EXAMINATION:  Physical Exam  GENERAL:  61 y.o.-year-old patient lying in the bed, Sedated, critically ill appearing. Disheveled looking  EYES: Pupils equal, round, sluggish reaction to light. No scleral icterus. Extraocular muscles intact.  HEENT: Head atraumatic, normocephalic. Oropharynx and nasopharynx clear.  NECK:  Supple, no jugular venous distention. No thyroid enlargement, no tenderness.  LUNGS: Normal breath sounds bilaterally, no wheezing, rales,rhonchi or crepitation. No use of accessory muscles of respiration. Decreased bibasilar breath sounds CARDIOVASCULAR: S1, S2 normal. No murmurs, rubs, or gallops.  ABDOMEN: Soft, nontender, nondistended. Bowel sounds present. No organomegaly or mass.  EXTREMITIES: No pedal edema, cyanosis, or clubbing.  NEUROLOGIC: Sedated and intubated PSYCHIATRIC: The patient is sedated.  SKIN: No obvious rash, lesion, or ulcer.    LABORATORY PANEL:   CBC  Recent Labs Lab 03/10/17 0244  WBC 5.1  HGB 11.1*  HCT 33.3*  PLT 116*   ------------------------------------------------------------------------------------------------------------------  Chemistries   Recent Labs Lab 03/10/17 0244  03/11/17 0515  NA 139  < > 143  K 4.1  < > 3.7    CL 104  < > 113*  CO2 23  < > 21*  GLUCOSE 153*  < > 134*  BUN 90*  < > 84*  CREATININE 3.02*  < > 1.84*  CALCIUM 9.0  < > 8.0*  MG 2.0  --   --   AST  --   < > 45*  ALT  --   < > 21  ALKPHOS  --   < > 68  BILITOT  --   < > 1.8*  < > = values in this interval not displayed. ------------------------------------------------------------------------------------------------------------------  Cardiac Enzymes  Recent Labs Lab 03/10/17 1537  TROPONINI 0.09*   ------------------------------------------------------------------------------------------------------------------  RADIOLOGY:  Dg Chest 1 View  Result Date: 03/11/2017 CLINICAL DATA:  Dyspnea EXAM: CHEST 1 VIEW COMPARISON:  03/10/2017 FINDINGS: Cardiac shadow is stable. Postsurgical changes are again noted. Endotracheal tube and nasogastric catheter are seen in satisfactory position. Right subclavian central line is again seen and stable although the tip is obscured by the orthopedic hardware. The lungs again demonstrate bibasilar atelectatic changes right greater than left. No other focal abnormality is seen. IMPRESSION: Persistent basilar atelectasis. The remainder of the exam is stable from the prior study. Electronically Signed   By: Inez Catalina M.D.   On: 03/11/2017 07:39   US Renal  Result Date: 03/10/2017 CLINICAL DATA:  Acute renal failure. EXAM: RENAL / URINARY TRACT ULTRASOUND COMPLETE COMPARISON:  None. FINDINGS: Right Kidney: Length: 14.5 cm. The right kidney demonstrates a lobular contour and increased cortical echogenicity. No suspicious mass or hydronephrosis. Left Kidney: Length: 14.9 cm. The patient's known left renal mass measuring 9.4 x 5.7 x 8.4 cm is again identified. No hydronephrosis. Bladder:  The bladder is decompressed with a Foley catheter. There is bladder wall thickening, also seen on the CT scan from May 2018. IMPRESSION: 1. Known left-sided renal malignancy. 2. Findings on the right are consistent with  medical renal disease. No hydronephrosis bilaterally. 3. Decompressed thick walled bladder, similar in appearance since the Oct 20, 2016 CT scan. Electronically Signed   By: Dorise Bullion III M.D   On: 03/10/2017 11:24   Dg Chest Port 1 View  Addendum Date: 03/10/2017   ADDENDUM REPORT: 03/10/2017 10:39 ADDENDUM: The impression includes a typographical error. The impression should read: No pneumothorax following central line placement on the right. The remainder of the exam is stable. Electronically Signed   By: Inez Catalina M.D.   On: 03/10/2017 10:39   Result Date: 03/10/2017 CLINICAL DATA:  Check central line placement EXAM: PORTABLE CHEST 1 VIEW COMPARISON:  03/10/2017 FINDINGS: Cardiac shadow is stable. Postoperative changes are noted in the midthoracic spine. Right subclavian central line is noted with catheter tip in the superior right atrium. No pneumothorax is seen. Endotracheal tube is again noted in satisfactory position. Mild bibasilar atelectasis is seen. IMPRESSION: A pneumothorax following central line placement on the right. The remainder of the exam is stable. Electronically Signed: By: Inez Catalina M.D. On: 03/10/2017 09:13   Dg Chest Port 1 View  Result Date: 03/10/2017 CLINICAL DATA:  Acute respiratory failure. Encounter for intubation. EXAM: PORTABLE CHEST 1 VIEW COMPARISON:  Chest x-ray dated 03/12/2017. FINDINGS: Endotracheal tube appears well positioned with tip approximately 2.5 cm above the carina. Patchy bibasilar airspace opacities, atelectasis versus pneumonia. Probable small left pleural effusion. No pneumothorax seen. Heart size and mediastinal contours appear stable. No acute or suspicious osseous finding. Stomach appears to be markedly distended with air, incompletely imaged. IMPRESSION: 1. Endotracheal tube appears well positioned with tip above the level of the carina. 2. Patchy bibasilar airspace opacities, atelectasis versus pneumonia. Probable small left pleural  effusion. 3. Stomach appears to be markedly distended with air, incompletely imaged. Consider confirmation/further characterization with plain film of the abdomen. These results will be called to the ordering clinician or representative by the Radiologist Assistant, and communication documented in the PACS or zVision Dashboard. Electronically Signed   By: Franki Cabot M.D.   On: 03/10/2017 07:59   Dg Chest Port 1 View  Result Date: 03/01/2017 CLINICAL DATA:  Respiratory distress. EXAM: PORTABLE CHEST 1 VIEW COMPARISON:  Chest CT 10/20/2016 FINDINGS: Midthoracic fusion and corpectomy Low lung volumes. Lung metastasis not well identified. One nodule noted LEFT lung base. For low lung volumes. No focal infiltrate. Basilar atelectasis on LEFT. IMPRESSION: LEFT basilar atelectasis.  No evidence pneumonia.  Mild congestion. Pulmonary metastasis under appreciated on radiograph Electronically Signed   By: Suzy Bouchard M.D.   On: 03/15/2017 23:35   Dg Abd Portable 1v  Result Date: 03/10/2017 CLINICAL DATA:  Encounter for orogastric tube placement. EXAM: PORTABLE ABDOMEN - 1 VIEW COMPARISON:  None. FINDINGS: Orogastric tube is in the left upper abdomen and likely within the stomach body. The stomach is distended with gas. Surgical hardware in the thoracic spine. There is a central venous catheter with the tip near the right atrium. Left basilar chest densities. IMPRESSION: Orogastric tube is in the stomach.  Gaseous distention of stomach. Central line tip is near the right atrium. Electronically Signed   By: Markus Daft M.D.   On: 03/10/2017 11:49    EKG:   Orders placed or performed during the hospital encounter of 02/24/2017  .  EKG 12-Lead  . EKG 12-Lead  . EKG 12-Lead  . EKG 12-Lead    ASSESSMENT AND PLAN:   61 year old male with past medical history significant for metastatic left renal cell carcinoma, hypertension, anxiety, congestive heart failure presents to hospital secondary to altered mental  status.  #1 sepsis-secondary to urinary tract infection - blood cultures positive for MSSA -Blood cultures urine cultures are pending. -Pro calcitonin and significantly elevated and also lactic acid. -due to septic shock- on levophed for hypotension.  - on nafcillin. Recommend ECHO to r/o vegetations  #2 acute renal failure-renal function was normal last week, renal failure is acute with creatinine of 3.4 on adm. - Much improving now. Secondary to ATN from sepsis -Still making urine, no acute indication for dialysis. -Nephrology consulted. Renal ultrasound with no obstruction. Carcinomatous left kidney noted. Medico-renal disease in right  #3 acute respiratory failure- failed BiPAP. Secondary to sepsis -Currently intubated and on ventilator. -Management per pulmonary. Sedation can be weaned for SBT today- on minimal vent settings  #4 metastatic renal cell carcinoma-following with oncology. Diagnosed in 2015 left renal cell carcinoma -Has lung metastatic disease. 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 -Was on chemotherapy with cabozantinib-which has been held for the last 2 weeks. -Also on chronic pain medications with MS Contin and oxycodone  #5 DVT prophylaxis-on subcutaneous heparin   Critically ill.    All the records are reviewed and case discussed with Care Management/Social Workerr. Management plans discussed with the patient, family and they are in agreement.  CODE STATUS: Full code  TOTAL TIME TAKING CARE OF THIS PATIENT: 36 minutes.   POSSIBLE D/C IN ? DAYS, DEPENDING ON CLINICAL CONDITION.   Gladstone Lighter M.D on 03/11/2017 at 7:57 AM  Between 7am to 6pm - Pager - [phone]  After 6pm go to [email or web address] - password [email or web address] [facility]  Office  [phone]  CC: Primary care physician; [name], MD

## 2017-03-11 NOTE — Progress Notes (Signed)
Initial Nutrition Assessment  DOCUMENTATION CODES:   Not applicable  INTERVENTION:  If patient expected to remain intubated greater than 24-48 hours, recommend initiating adult TF protocol.  Recommend goal TF regimen of Vital 1.5 at 55 ml/hr + Pro-Stat 30 ml BID via OGT. Goal TF regimen provides 2180 kcal, 119 grams of protein, 1003 ml H2O daily.  NUTRITION DIAGNOSIS:   Inadequate oral intake related to inability to eat as evidenced by NPO status.  GOAL:   Provide needs based on ASPEN/SCCM guidelines  MONITOR:   Vent status, Labs, Weight trends, TF tolerance, Skin, I & O's  REASON FOR ASSESSMENT:   Malnutrition Screening Tool, Ventilator    ASSESSMENT:   61 year old male with PMHx of metastatic renal cell carcinoma on chemotherapy, HTN, hx MI, depression, anxiety, acid reflux, HLD, CHF, renal insufficiency brought in by family with AMS found to have urosepsis, septic shock, acute renal failure, acute respiratory failure requiring emergent intubation on 8/22 after failing BiPAP.   -Patient is on dose-reduced cabozantinib. -Pending PMT c/s for goals of care. -Noted on abdominal x-ray 9/22 patient with gaseous distention of stomach. Underwent decompression with OGT to LIS. 1850 ml thin, brown output past 24 hrs. No output at time of RD assessment. Abdomen no longer appears distended.  No family at bedside during assessment. Per review of weight history in chart patient's weight is trending down. He was 260.5 lbs on 03/17/2016. He has lost 49.1 lbs (18.8% body weight) over the past year, which is not quite significant for time frame.  Access: 14 Fr. OGT placed 9/22; verified to terminate in stomach per abdominal x-ray 9/22; 60 cm at corner of mouth  MAP: 63-83 mmHg  Patient is currently intubated on ventilator support MV: 8.6 L/min Temp (24hrs), Avg:99.3 F (37.4 C), Min:98.6 F (37 C), Max:99.9 F (37.7 C)  Propofol: N/A  Medications reviewed and include: pantoprazole,  fentanyl gtt, Levophed gtt (off since 0800).  Labs reviewed: CBG 115-133, Chloride 113, CO2 21, BUN 84 (trending down), Creatinine 1.84 (trending down), eGFR 38.  Limited Nutrition-Focused physical exam completed to not disturb patient as he was in SBT. Findings are no fat depletion, moderate muscle depletion (temple region, clavicle region, clavicle bone region), and mild pitting edema to bilateral lower extremities. Abdomen soft.  Patient does not meet criteria for malnutrition at this time but is at risk for malnutrition.  Discussed with RN. Patient currently in SBT. Will know plan of care later.  Diet Order:  Diet NPO time specified  Skin:  Wound (see comment) (open wound right foot; blisters to bottom of feet)  Last BM:  Unknown  Height:   Ht Readings from Last 1 Encounters:  03/10/17 6' 3"  (1.905 m)    Weight:   Wt Readings from Last 1 Encounters:  03/10/17 211 lb 6.7 oz (95.9 kg)    Ideal Body Weight:  89.1 kg  BMI:  Body mass index is 26.43 kg/m.  Estimated Nutritional Needs:   Kcal:  2130 (PSU 2003b w/ MSJ 1854, Ve 8.6, Tmax 37.7)  Protein:  115-134 grams (1.2-1.4 grams/kg)  Fluid:  2.4-2.9 L/day (25-30 ml/kg)  EDUCATION NEEDS:   No education needs identified at this time  Willey Blade, MS, RD, LDN Pager: [phone] After Hours Pager: [phone]

## 2017-03-11 NOTE — Progress Notes (Signed)
[facility]  ROUNDING NOTE   Subjective:  Renal function has improved. Cr down to 1.84 at the moment.  BUN currently 84.  Good UOP of 2 liters over the preceding 24 hours. Patient now intubated as well.  Critically ill appearing.   Objective:  Vital signs in last 24 hours:  Temp:  [98.8 F (37.1 C)-99.9 F (37.7 C)] 99.5 F (37.5 C) (09/23 1200) Pulse Rate:  [101-124] 119 (09/23 1200) Resp:  [13-24] 19 (09/23 1200) BP: (86-115)/(49-74) 112/59 (09/23 1200) SpO2:  [88 %-100 %] 88 % (09/23 1200) FiO2 (%):  [30 %] 30 % (09/23 0835)  Weight change:  Filed Weights   03/05/2017 2337 03/10/17 0200  Weight: 95.3 kg (210 lb) 95.9 kg (211 lb 6.7 oz)    Intake/Output: I/O last 3 completed shifts: In: 12270.7 [I.V.:4420.7; IV Piggyback:7850] Out: 6075 [Urine:4225; Emesis/NG output:1850]   Intake/Output this shift:  Total I/O In: 869.9 [I.V.:639.9; NG/GT:30; IV Piggyback:200] Out: -   Physical Exam: General: Critically illl appearing  Head: ETT in place  Eyes: Anicteric  Neck: Supple, trachea midline  Lungs:  Scattered rhonchi, vent assisted  Heart: S1S2 no rubs  Abdomen:  Soft, nontender, bowel sounds present  Extremities: 1+ peripheral edema.  Neurologic: Arousable, does nod yes/no to questions  Skin: No lesions       Basic Metabolic Panel:  Recent Labs Lab 02/18/2017 2220 03/10/17 0244 03/10/17 1121 03/10/17 1537 03/11/17 0515  NA 139 139 139 140 143  K 4.2 4.1 3.9 4.0 3.7  CL 102 104 108 109 113*  CO2 23 23 20* 19* 21*  GLUCOSE 160* 153* 137* 124* 134*  BUN 86* 90* 94* 93* 84*  CREATININE 3.47* 3.02* 2.71* 2.49* 1.84*  CALCIUM 9.3 9.0 8.3* 8.1* 8.0*  MG  --  2.0  --   --   --   PHOS  --  3.6  --   --   --     Liver Function Tests:  Recent Labs Lab 02/18/2017 2220 03/10/17 1537 03/11/17 0515  AST 39 56* 45*  ALT _0 ALKPHOS 94 76 68  BILITOT 0.7 0.8 1.8*  PROT 6.3* 5.2* 5.1*  ALBUMIN 2.4* 1.8* 1.8*    Recent Labs Lab  03/13/2017 2220  LIPASE 11   No results for input(s): AMMONIA in the last 168 hours.  CBC:  Recent Labs Lab 03/13/2017 2220 03/10/17 0244  WBC 7.6 5.1  NEUTROABS 6.5  --   HGB 11.8* 11.1*  HCT 35.5* 33.3*  MCV 90.2 88.7  PLT 116* 116*    Cardiac Enzymes:  Recent Labs Lab  2220 03/10/17 0245 03/10/17 1121 03/10/17 1537  TROPONINI 0.24* 0.29* 0.11* 0.09*    BNP: Invalid input(s): POCBNP  CBG:  Recent Labs Lab 03/10/17 0210 03/10/17 2353 03/11/17 0636 03/11/17 1150  GLUCAP 162* 115* 133* 127*    Microbiology: Results for orders placed or performed during the hospital encounter of 03/12/2017  Blood Culture (routine x 2)     Status: Abnormal (Preliminary result)   Collection Time: 03/07/2017 10:10 PM  Result Value Ref Range Status   Specimen Description BLOOD RIGHT ANTECUBITAL  Final   Special Requests   Final    BOTTLES DRAWN AEROBIC AND ANAEROBIC Blood Culture adequate volume   Culture  Setup Time   Final    GRAM POSITIVE COCCI IN BOTH AEROBIC AND ANAEROBIC BOTTLES CRITICAL RESULT CALLED TO, READ BACK BY AND VERIFIED WITH: SCOTT CHRISTY ON 03/10/17 AT [facility]  Culture STAPHYLOCOCCUS AUREUS (A)  Final   Report Status PENDING  Incomplete  Blood Culture (routine x 2)     Status: Abnormal (Preliminary result)   Collection Time: 03/04/2017 10:30 PM  Result Value Ref Range Status   Specimen Description BLOOD BLOOD RIGHT ARM  Final   Special Requests   Final    BOTTLES DRAWN AEROBIC AND ANAEROBIC Blood Culture results may not be optimal due to an excessive volume of blood received in culture bottles   Culture  Setup Time   Final    IN BOTH AEROBIC AND ANAEROBIC BOTTLES GRAM POSITIVE COCCI CRITICAL RESULT CALLED TO, READ BACK BY AND VERIFIED WITH: SCOTT CHRISTY ON 03/10/17 AT 1220 [facility]    Culture (A)  Final    STAPHYLOCOCCUS AUREUS SUSCEPTIBILITIES TO FOLLOW Performed at [facility], [place] [address], [place], [place] [address]    Report Status  PENDING  Incomplete  Blood Culture ID Panel (Reflexed)     Status: Abnormal   Collection Time: 03/03/2017 10:30 PM  Result Value Ref Range Status   Enterococcus species NOT DETECTED NOT DETECTED Final   Listeria monocytogenes NOT DETECTED NOT DETECTED Final   Staphylococcus species DETECTED (A) NOT DETECTED Final    Comment: CRITICAL RESULT CALLED TO, READ BACK BY AND VERIFIED WITH: SCOTT CHRISTY ON 03/10/17 AT 1220 [place]    Staphylococcus aureus DETECTED (A) NOT DETECTED Final    Comment: Methicillin (oxacillin) susceptible Staphylococcus aureus (MSSA). Preferred therapy is anti staphylococcal beta lactam antibiotic (Cefazolin or Nafcillin), unless clinically contraindicated. CRITICAL RESULT CALLED TO, READ BACK BY AND VERIFIED WITH: SCOTT CHRISTY ON 03/10/17 AT 1220 [place]    Methicillin resistance NOT DETECTED NOT DETECTED Final   Streptococcus species NOT DETECTED NOT DETECTED Final   Streptococcus agalactiae NOT DETECTED NOT DETECTED Final   Streptococcus pneumoniae NOT DETECTED NOT DETECTED Final   Streptococcus pyogenes NOT DETECTED NOT DETECTED Final   Acinetobacter baumannii NOT DETECTED NOT DETECTED Final   Enterobacteriaceae species NOT DETECTED NOT DETECTED Final   Enterobacter cloacae complex NOT DETECTED NOT DETECTED Final   Escherichia coli NOT DETECTED NOT DETECTED Final   Klebsiella oxytoca NOT DETECTED NOT DETECTED Final   Klebsiella pneumoniae NOT DETECTED NOT DETECTED Final   Proteus species NOT DETECTED NOT DETECTED Final   Serratia marcescens NOT DETECTED NOT DETECTED Final   Haemophilus influenzae NOT DETECTED NOT DETECTED Final   Neisseria meningitidis NOT DETECTED NOT DETECTED Final   Pseudomonas aeruginosa NOT DETECTED NOT DETECTED Final   Candida albicans NOT DETECTED NOT DETECTED Final   Candida glabrata NOT DETECTED NOT DETECTED Final   Candida krusei NOT DETECTED NOT DETECTED Final   Candida parapsilosis NOT DETECTED NOT DETECTED Final   Candida tropicalis NOT  DETECTED NOT DETECTED Final  Urine culture     Status: Abnormal (Preliminary result)   Collection Time: 03/10/17 12:30 AM  Result Value Ref Range Status   Specimen Description URINE, CATHETERIZED  Final   Special Requests NONE  Final   Culture (A)  Final    >=100,000 COLONIES/mL STAPHYLOCOCCUS AUREUS SUSCEPTIBILITIES TO FOLLOW Performed at [facility], [address], [place], [place] [address]    Report Status PENDING  Incomplete  MRSA PCR Screening     Status: None   Collection Time: 03/10/17  2:22 AM  Result Value Ref Range Status   MRSA by PCR NEGATIVE NEGATIVE Final    Comment:        The GeneXpert MRSA Assay (FDA approved for  NASAL specimens only), is one component of a comprehensive MRSA colonization surveillance program. It is not intended to diagnose MRSA infection nor to guide or monitor treatment for MRSA infections.   Culture, respiratory (NON-Expectorated)     Status: None (Preliminary result)   Collection Time: 03/10/17  9:00 AM  Result Value Ref Range Status   Specimen Description TRACHEAL ASPIRATE  Final   Special Requests NONE  Final   Gram Stain   Final    FEW WBC PRESENT, PREDOMINANTLY MONONUCLEAR RARE SQUAMOUS EPITHELIAL CELLS PRESENT RARE GRAM POSITIVE COCCI IN PAIRS IN SINGLES    Culture   Final    CULTURE REINCUBATED FOR BETTER GROWTH Performed at [facility], [place] [address], [place], [place] [address]    Report Status PENDING  Incomplete    Coagulation Studies:  Recent Labs  02/27/2017 2220  LABPROT 17.0*  INR 1.40    Urinalysis:  Recent Labs  03/10/17 0030  COLORURINE AMBER*  LABSPEC 1.010  PHURINE 7.0  GLUCOSEU NEGATIVE  HGBUR MODERATE*  BILIRUBINUR NEGATIVE  KETONESUR NEGATIVE  PROTEINUR 100*  NITRITE NEGATIVE  LEUKOCYTESUR LARGE*      Imaging: Dg Chest 1 View  Result Date: 03/11/2017 CLINICAL DATA:  Dyspnea EXAM: CHEST 1 VIEW COMPARISON:  03/10/2017 FINDINGS: Cardiac shadow is stable. Postsurgical  changes are again noted. Endotracheal tube and nasogastric catheter are seen in satisfactory position. Right subclavian central line is again seen and stable although the tip is obscured by the orthopedic hardware. The lungs again demonstrate bibasilar atelectatic changes right greater than left. No other focal abnormality is seen. IMPRESSION: Persistent basilar atelectasis. The remainder of the exam is stable from the prior study. Electronically Signed   By: Inez Catalina M.D.   On: 03/11/2017 07:39   US Renal  Result Date: 03/10/2017 CLINICAL DATA:  Acute renal failure. EXAM: RENAL / URINARY TRACT ULTRASOUND COMPLETE COMPARISON:  None. FINDINGS: Right Kidney: Length: 14.5 cm. The right kidney demonstrates a lobular contour and increased cortical echogenicity. No suspicious mass or hydronephrosis. Left Kidney: Length: 14.9 cm. The patient's known left renal mass measuring 9.4 x 5.7 x 8.4 cm is again identified. No hydronephrosis. Bladder: The bladder is decompressed with a Foley catheter. There is bladder wall thickening, also seen on the CT scan from May 2018. IMPRESSION: 1. Known left-sided renal malignancy. 2. Findings on the right are consistent with medical renal disease. No hydronephrosis bilaterally. 3. Decompressed thick walled bladder, similar in appearance since the Oct 20, 2016 CT scan. Electronically Signed   By: Dorise Bullion III M.D   On: 03/10/2017 11:24   Dg Chest Port 1 View  Addendum Date: 03/10/2017   ADDENDUM REPORT: 03/10/2017 10:39 ADDENDUM: The impression includes a typographical error. The impression should read: No pneumothorax following central line placement on the right. The remainder of the exam is stable. Electronically Signed   By: Inez Catalina M.D.   On: 03/10/2017 10:39   Result Date: 03/10/2017 CLINICAL DATA:  Check central line placement EXAM: PORTABLE CHEST 1 VIEW COMPARISON:  03/10/2017 FINDINGS: Cardiac shadow is stable. Postoperative changes are noted in the  midthoracic spine. Right subclavian central line is noted with catheter tip in the superior right atrium. No pneumothorax is seen. Endotracheal tube is again noted in satisfactory position. Mild bibasilar atelectasis is seen. IMPRESSION: A pneumothorax following central line placement on the right. The remainder of the exam is stable. Electronically Signed: By: Inez Catalina M.D. On: 03/10/2017 09:13   Dg Chest [facility]  1 View  Result Date: 03/10/2017 CLINICAL DATA:  Acute respiratory failure. Encounter for intubation. EXAM: PORTABLE CHEST 1 VIEW COMPARISON:  Chest x-ray dated 03/01/2017. FINDINGS: Endotracheal tube appears well positioned with tip approximately 2.5 cm above the carina. Patchy bibasilar airspace opacities, atelectasis versus pneumonia. Probable small left pleural effusion. No pneumothorax seen. Heart size and mediastinal contours appear stable. No acute or suspicious osseous finding. Stomach appears to be markedly distended with air, incompletely imaged. IMPRESSION: 1. Endotracheal tube appears well positioned with tip above the level of the carina. 2. Patchy bibasilar airspace opacities, atelectasis versus pneumonia. Probable small left pleural effusion. 3. Stomach appears to be markedly distended with air, incompletely imaged. Consider confirmation/further characterization with plain film of the abdomen. These results will be called to the ordering clinician or representative by the Radiologist Assistant, and communication documented in the PACS or zVision Dashboard. Electronically Signed   By: Franki Cabot M.D.   On: 03/10/2017 07:59   Dg Chest Port 1 View  Result Date: 03/15/2017 CLINICAL DATA:  Respiratory distress. EXAM: PORTABLE CHEST 1 VIEW COMPARISON:  Chest CT 10/20/2016 FINDINGS: Midthoracic fusion and corpectomy Low lung volumes. Lung metastasis not well identified. One nodule noted LEFT lung base. For low lung volumes. No focal infiltrate. Basilar atelectasis on LEFT. IMPRESSION:  LEFT basilar atelectasis.  No evidence pneumonia.  Mild congestion. Pulmonary metastasis under appreciated on radiograph Electronically Signed   By: Suzy Bouchard M.D.   On: 03/11/2017 23:35   Dg Abd Portable 1v  Result Date: 03/10/2017 CLINICAL DATA:  Encounter for orogastric tube placement. EXAM: PORTABLE ABDOMEN - 1 VIEW COMPARISON:  None. FINDINGS: Orogastric tube is in the left upper abdomen and likely within the stomach body. The stomach is distended with gas. Surgical hardware in the thoracic spine. There is a central venous catheter with the tip near the right atrium. Left basilar chest densities. IMPRESSION: Orogastric tube is in the stomach.  Gaseous distention of stomach. Central line tip is near the right atrium. Electronically Signed   By: Markus Daft M.D.   On: 03/10/2017 11:49     Medications:   . sodium chloride 100 mL/hr at 03/11/17 0830  . fentaNYL infusion INTRAVENOUS 50 mcg/hr (03/11/17 1050)  . nafcillin IV 2 g (03/11/17 1200)  . norepinephrine (LEVOPHED) Adult infusion Stopped (03/11/17 0800)  . phenylephrine (NEO-SYNEPHRINE) Adult infusion     . chlorhexidine gluconate (MEDLINE KIT)  15 mL Mouth Rinse BID  . heparin  5,000 Units Subcutaneous Q8H  . ipratropium-albuterol  3 mL Nebulization Q6H  . mouth rinse  15 mL Mouth Rinse 10 times per day  . pantoprazole (PROTONIX) IV  40 mg Intravenous Daily   acetaminophen **OR** acetaminophen, bisacodyl, fentaNYL, midazolam, midazolam, ondansetron **OR** ondansetron (ZOFRAN) IV, sennosides  Assessment/ Plan:  61 y.o. male with a PMHx of GERD, anxiety, congestive heart failure, depression, hyperlipidemia, hypertension, myocardial infarction, renal cell carcinoma on active chemotherapy, who was admitted to [facility] on 03/05/2017 for evaluation of sepsis thought to be secondary to pneumonia.   1.  Acute renal failure,  Secondary to sepsis, but also has hx of GU cancer. 2.  Acute respiratory failure.  3.  Anemia unspecified. 4.   Renal cell carcinoma, on the left.    Plan:  No hydronephrosis noted in right kidney.  Renal function has improved over the preceding 24 hours.  Continue IV fluid hydration.  No acute indication for dialysis at the moment.  Continue nafcillin for underlying sepsis.  Patient to be continued  on ventilatory support for now.  Pulmonary/critical care following closely to see when the patient can be extubated.   LOS: 1 Loan Oguin 9/23/20181:33 PM

## 2017-03-12 ENCOUNTER — Inpatient Hospital Stay: Payer: Medicaid Other

## 2017-03-12 ENCOUNTER — Ambulatory Visit: Payer: Medicaid Other

## 2017-03-12 ENCOUNTER — Telehealth: Payer: Self-pay | Admitting: *Deleted

## 2017-03-12 DIAGNOSIS — J189 Pneumonia, unspecified organism: Secondary | ICD-10-CM

## 2017-03-12 DIAGNOSIS — J9602 Acute respiratory failure with hypercapnia: Secondary | ICD-10-CM

## 2017-03-12 DIAGNOSIS — R748 Abnormal levels of other serum enzymes: Secondary | ICD-10-CM

## 2017-03-12 DIAGNOSIS — J9601 Acute respiratory failure with hypoxia: Secondary | ICD-10-CM

## 2017-03-12 LAB — CULTURE, BLOOD (ROUTINE X 2): Special Requests: ADEQUATE

## 2017-03-12 LAB — CBC
HEMATOCRIT: 25.1 % — AB (ref 40.0–52.0)
Hemoglobin: 8.4 g/dL — ABNORMAL LOW (ref 13.0–18.0)
MCH: 29 pg (ref 26.0–34.0)
MCHC: 33.6 g/dL (ref 32.0–36.0)
MCV: 86.2 fL (ref 80.0–100.0)
Platelets: 83 10*3/uL — ABNORMAL LOW (ref 150–440)
RBC: 2.91 MIL/uL — ABNORMAL LOW (ref 4.40–5.90)
RDW: 25.7 % — AB (ref 11.5–14.5)
WBC: 6.8 10*3/uL (ref 3.8–10.6)

## 2017-03-12 LAB — GLUCOSE, CAPILLARY
Glucose-Capillary: 120 mg/dL — ABNORMAL HIGH (ref 65–99)
Glucose-Capillary: 98 mg/dL (ref 65–99)

## 2017-03-12 LAB — MAGNESIUM: MAGNESIUM: 2.2 mg/dL (ref 1.7–2.4)

## 2017-03-12 LAB — PROCALCITONIN: Procalcitonin: 7.84 ng/mL

## 2017-03-12 LAB — URINE CULTURE: Culture: >=100000 — AB

## 2017-03-12 LAB — BASIC METABOLIC PANEL
Anion gap: 6 (ref 5–15)
BUN: 68 mg/dL — AB (ref 6–20)
CHLORIDE: 119 mmol/L — AB (ref 101–111)
CO2: 24 mmol/L (ref 22–32)
CREATININE: 1.53 mg/dL — AB (ref 0.61–1.24)
Calcium: 8.3 mg/dL — ABNORMAL LOW (ref 8.9–10.3)
GFR calc Af Amer: 55 mL/min — ABNORMAL LOW (ref >60)
GFR calc non Af Amer: 47 mL/min — ABNORMAL LOW (ref >60)
Glucose, Bld: 129 mg/dL — ABNORMAL HIGH (ref 65–99)
Potassium: 3.3 mmol/L — ABNORMAL LOW (ref 3.5–5.1)
SODIUM: 149 mmol/L — AB (ref 135–145)

## 2017-03-12 MED ORDER — FENTANYL 12 MCG/HR TD PT72: 25.0000 ug | MEDICATED_PATCH | TRANSDERMAL | Status: DC

## 2017-03-12 MED ORDER — QUETIAPINE FUMARATE 25 MG PO TABS: 25.0000 mg | ORAL_TABLET | Freq: Every day | ORAL | Status: DC

## 2017-03-12 MED ORDER — FREE WATER: 125.0000 mL | Freq: Four times a day (QID) | Status: DC

## 2017-03-12 MED ORDER — POTASSIUM CHLORIDE 20 MEQ PO PACK: 40.0000 meq | PACK | Freq: Once | ORAL | Status: DC

## 2017-03-12 MED ORDER — SODIUM CHLORIDE 0.9 % IV SOLN
5.0000 mg/h | INTRAVENOUS | Status: DC
  Administered 2017-03-12: 5 mg/h via INTRAVENOUS
  Filled 2017-03-12: qty 10

## 2017-03-12 MED ORDER — PRO-STAT SUGAR FREE PO LIQD: 30.0000 mL | Freq: Two times a day (BID) | ORAL | Status: DC

## 2017-03-12 MED ORDER — QUETIAPINE FUMARATE 25 MG PO TABS: 25.0000 mg | ORAL_TABLET | Freq: Once | ORAL | Status: DC

## 2017-03-12 MED ORDER — VITAL HIGH PROTEIN PO LIQD: 1000.0000 mL | ORAL | Status: DC

## 2017-03-12 MED ORDER — VITAL 1.5 CAL PO LIQD: 1000.0000 mL | ORAL | Status: DC

## 2017-03-12 MED ORDER — SODIUM CHLORIDE 0.9 % IV SOLN
10.0000 mg/h | INTRAVENOUS | Status: DC
  Administered 2017-03-12: 5 mg/h via INTRAVENOUS
  Filled 2017-03-12: qty 10

## 2017-03-12 MED ORDER — GABAPENTIN 250 MG/5ML PO SOLN
300.0000 mg | Freq: Three times a day (TID) | ORAL | Status: DC
  Filled 2017-03-12: qty 6

## 2017-03-12 MED ORDER — LORAZEPAM 2 MG/ML IJ SOLN
2.0000 mg | INTRAMUSCULAR | Status: DC | PRN
  Administered 2017-03-12 – 2017-03-13 (×2): 2 mg via INTRAVENOUS
  Administered 2017-03-13: 4 mg via INTRAVENOUS
  Administered 2017-03-13: 2 mg via INTRAVENOUS
  Filled 2017-03-12 (×2): qty 1
  Filled 2017-03-12: qty 2
  Filled 2017-03-12: qty 1

## 2017-03-12 MED ORDER — SERTRALINE HCL 50 MG PO TABS: 100.0000 mg | ORAL_TABLET | Freq: Every day | ORAL | Status: DC

## 2017-03-12 MED ORDER — PANTOPRAZOLE SODIUM 40 MG PO PACK: 40.0000 mg | PACK | Freq: Every day | ORAL | Status: DC

## 2017-03-12 MED ORDER — POTASSIUM CHLORIDE 2 MEQ/ML IV SOLN
INTRAVENOUS | Status: DC
  Filled 2017-03-12: qty 1000

## 2017-03-12 NOTE — Telephone Encounter (Signed)
MD aware. Thanks.

## 2017-03-12 NOTE — Progress Notes (Signed)
After further discussion with the wife, at bedside, patient has extensive Mets to bone and Lungs and he has been suffering for many years. He WOULD NOT want to be on life support and he has been dying.  I have dicussed DNR status and she has agreed to DNR status.  I have also discussed comfort care measures, and the wife, has agreed and consented to proceeding with comfort care measures.    Corrin Parker, M.D.  [facility]  Medical Director [place] Director [facility] Cardio-Pulmonary Department

## 2017-03-12 NOTE — Progress Notes (Signed)
Chaplain was making rounds and visited with pt in room Pink. Chaplain provided [employer] of prayer and a pastoral presence. Chaplain is available for follow up as needed.    03/12/17 1320  Clinical Encounter Type  Visited With Patient  Visit Type Initial;Spiritual support  Referral From Nurse  Consult/Referral To Chaplain  Spiritual Encounters  Spiritual Needs Prayer

## 2017-03-12 NOTE — Progress Notes (Signed)
Dr. Mortimer Fries met with patients wife today, 03/12/2017 at 18, patients wife decided to transition patient to comfort care only.  Patients wife is waiting for out of town family to arrive before extubating.  Patients wife has also expressed she would like to consult patients primary oncologist prior to extubation.  Message has been left with Oncologist's office.

## 2017-03-12 NOTE — Progress Notes (Signed)
   03/12/17 1100  Clinical Encounter Type  Visited With Patient;Health care provider  Visit Type Initial;Critical Care;Patient actively dying  Referral From Physician  Spiritual Encounters  Spiritual Needs Emotional  [facility] visited patient room; no family present; patient placed on comfort care; MD indicated wife had been there and will likely return Joice will follow up later today.

## 2017-03-12 NOTE — Progress Notes (Signed)
Called NP to inform that AM labs showed drop in K to 3.3. NP to put in K orders.

## 2017-03-12 NOTE — Progress Notes (Signed)
[facility] Progess Note    SYNOPSIS   61 year old male with metastatic renal cell carcinoma presenting with urosepsis, septic shock, acute respiratory failure and acute renal failure, septic shock.    ASSESSMENT/PLAN    PULMONARY A:acute respiratory failure, possible pneumonia. Vent dependent P:   Patient failed SAT/SBT due to resp muscle fatigue  VENTILATOR SETTINGS: Vent Mode: PRVC FiO2 (%):  [30 %] 30 % Set Rate:  [18 bmp] 18 bmp Vt Set:  [550 mL] 550 mL PEEP:  [5 cmH20] 5 cmH20 Plateau Pressure:  [16 UXL24-40 cmH20] 21 cmH20  CARDIOVASCULAR A: septic shock with hypotension.UTI versus pneumonia P:  Continue to wean down pressors as tolerated. HEMODYNAMICS:    RENAL A:  Acute kidney inary to septic shock P:   Continue IV fluids. Appears improving.  GASTROINTESTINAL A:   P:   Tube feeds. Continue GI prophylaxis.  HEMATOLOGIC A: left renal cancer with mets to lung and spine s/p multiple courses of chemo and multiple surgeries.    INFECTIOUS A:  Septic shock. HBV, HCV positive.  P:    Micro/culture results:  BCx2 staph aureus UC staph aureus Sputum pending  Antibiotics: nafcillin  ENDOCRINE A: Glucose controlled.   NEUROLOGIC A:  Sedated, on vent.     MAJOR EVENTS/TEST RESULTS: 09/22; admitted.  09/23; failed weaning trial due to WOB.  09/24 failed weaning trial due to resp muscle fatigue Best Practices  DVT Prophylaxis: heparin SQ.  GI Prophylaxis: PPI   ---------------------------------------   ----------------------------------------   Name: Marc Blake MRN: [id number] DOB: May 20, 1956    ADMISSION DATE:  03/15/2017   SUBJECTIVE:  Pt currently on the ventilator, can not provide history or review of systems.  Remains critically ill  Review of Systems:  Can not provide ROS due to critical illness.    VITAL SIGNS: Temp:  [99.1 F (37.3 C)-101.3 F (38.5 C)] 101.3 F (38.5 C) (09/24  0800) Pulse Rate:  [101-135] 135 (09/24 0800) Resp:  [0-24] 0 (09/24 0800) BP: (88-182)/(57-100) 182/89 (09/24 0800) SpO2:  [88 %-100 %] 92 % (09/24 0800) FiO2 (%):  [30 %] 30 % (09/24 0600)     Physical Examination:   VS: BP (!) 182/89 (BP Location: Right Arm)   Pulse (!) 135   Temp (!) 101.3 F (38.5 C) (Core (Comment))   Resp (!) 0   Ht 6\' 3"  (1.905 m)   Wt 211 lb 6.7 oz (95.9 kg)   SpO2 92%   BMI 26.43 kg/m   General Appearance: +distress Neuro GCS<8T HEENT: PERRLA, EOM intact. Pulmonary: normal breath sounds , +rhonchi CardiovascularNormal S1,S2.  No m/r/g.   Abdomen: Benign, Soft, non-tender. Renal:  No costovertebral tenderness  GU:  Not performed at this time. Endocrine: No evident thyromegaly. Skin:   warm, no rashes, no ecchymosis  Extremities: normal, no cyanosis, clubbing.    LABORATORY PANEL:   CBC  Recent Labs Lab 03/12/17 0457  WBC 6.8  HGB 8.4*  HCT 25.1*  PLT 83*    Chemistries   Recent Labs Lab 03/10/17 0244  03/11/17 0515 03/12/17 0457  NA 139  < > 143 149*  K 4.1  < > 3.7 3.3*  CL 104  < > 113* 119*  CO2 23  < > 21* 24  GLUCOSE 153*  < > 134* 129*  BUN 90*  < > 84* 68*  CREATININE 3.02*  < > 1.84* 1.53*  CALCIUM 9.0  < > 8.0* 8.3*  MG 2.0  --   --  2.2  PHOS 3.6  --   --   --   AST  --   < > 45*  --   ALT  --   < > 21  --   ALKPHOS  --   < > 68  --   BILITOT  --   < > 1.8*  --   < > = values in this interval not displayed.   Recent Labs Lab 03/10/17 2353 03/11/17 0636 03/11/17 1150 03/11/17 1814 03/11/17 2356 03/12/17 0636  GLUCAP 115* 133* 127* 137* 120* 98    Recent Labs Lab 03/10/17 0516 03/10/17 0900 03/11/17 0500  PHART 7.20* 7.23* 7.31*  PCO2ART 53* 43 43  PO2ART 63* 93 98    Recent Labs Lab 03/04/2017 2220 03/10/17 1537 03/11/17 0515  AST 39 56* 45*  ALT 19 23 21   ALKPHOS 94 76 68  BILITOT 0.7 0.8 1.8*  ALBUMIN 2.4* 1.8* 1.8*    Cardiac Enzymes  Recent Labs Lab 03/10/17 1537    TROPONINI 0.09*    Critical Care Time devoted to patient care services described in this note is 45 minutes.   Overall, patient is critically ill, prognosis is guarded.  Patient with Multiorgan failure and at high risk for cardiac arrest and death.    Corrin Parker, M.D.  [facility]  Medical Director [place] Director [facility] Cardio-Pulmonary Department

## 2017-03-12 NOTE — Progress Notes (Signed)
[place] [facility] [place] NAME: Pancho Rushing    MR#:  [id number]  DATE OF BIRTH:  11-30-1955  SUBJECTIVE:  CHIEF COMPLAINT:   Chief Complaint  Patient presents with  . Code Sepsis   -Patient with left renal cell carcinoma with metastatic disease, admitted with altered mental status and respiratory failure and septic shock - currently remains on vent- family decided comfort care- awaiting daughters arrival from out of state  REVIEW OF SYSTEMS:  Review of Systems  Unable to perform ROS: Critical illness    DRUG ALLERGIES:  No Known Allergies  VITALS:  Blood pressure 140/81, pulse (!) 130, temperature (!) 101.3 F (38.5 C), resp. rate (!) 23, height 6\' 3"  (1.905 m), weight 95.9 kg (211 lb 6.7 oz), SpO2 (!) 88 %.  PHYSICAL EXAMINATION:  Physical Exam  GENERAL:  61 y.o.-year-old patient lying in the bed, Sedated, critically ill appearing.  EYES: Pupils equal, round, sluggish reaction to light. No scleral icterus. Extraocular muscles intact.  HEENT: Head atraumatic, normocephalic. Oropharynx and nasopharynx clear.  NECK:  Supple, no jugular venous distention. No thyroid enlargement, no tenderness.  LUNGS: Normal breath sounds bilaterally, no wheezing, rales,rhonchi or crepitation. No use of accessory muscles of respiration. Decreased bibasilar breath sounds CARDIOVASCULAR: S1, S2 normal. No murmurs, rubs, or gallops.  ABDOMEN: Soft, nontender, nondistended. Bowel sounds present. No organomegaly or mass.  EXTREMITIES: No pedal edema, cyanosis, or clubbing.  NEUROLOGIC: Sedated and intubated PSYCHIATRIC: The patient is sedated.  SKIN: No obvious rash, lesion, or ulcer.    LABORATORY PANEL:   CBC  Recent Labs Lab 03/12/17 0457  WBC 6.8  HGB 8.4*  HCT 25.1*  PLT 83*   ------------------------------------------------------------------------------------------------------------------  Chemistries   Recent Labs Lab  03/11/17 0515 03/12/17 0457  NA 143 149*  K 3.7 3.3*  CL 113* 119*  CO2 21* 24  GLUCOSE 134* 129*  BUN 84* 68*  CREATININE 1.84* 1.53*  CALCIUM 8.0* 8.3*  MG  --  2.2  AST 45*  --   ALT 21  --   ALKPHOS 68  --   BILITOT 1.8*  --    ------------------------------------------------------------------------------------------------------------------  Cardiac Enzymes  Recent Labs Lab 03/10/17 1537  TROPONINI 0.09*   ------------------------------------------------------------------------------------------------------------------  RADIOLOGY:  Dg Chest 1 View  Result Date: 03/12/2017 CLINICAL DATA:  Dyspnea EXAM: CHEST 1 VIEW COMPARISON:  03/11/2017 FINDINGS: Endotracheal tube tip is at the clavicular heads. An orogastric tube reaches the stomach at least. There is a right subclavian central line with tip obscured by spinal hardware, likely ending at the SVC level. There is a low volume chest with streaky opacities at the bases. Stable heart size, distorted by rotation. No edema, visible effusion, or pneumothorax. Missing posterior left rib, possibly thoracotomy for the patient's corpectomy. IMPRESSION: 1. Stable positioning of tubes and central line. 2. Low volume chest with atelectasis or pneumonia, unchanged. Electronically Signed   By: Monte Fantasia M.D.   On: 03/12/2017 07:11   Dg Chest 1 View  Result Date: 03/11/2017 CLINICAL DATA:  Dyspnea EXAM: CHEST 1 VIEW COMPARISON:  03/10/2017 FINDINGS: Cardiac shadow is stable. Postsurgical changes are again noted. Endotracheal tube and nasogastric catheter are seen in satisfactory position. Right subclavian central line is again seen and stable although the tip is obscured by the orthopedic hardware. The lungs again demonstrate bibasilar atelectatic changes right greater than left. No other focal abnormality is seen. IMPRESSION: Persistent basilar atelectasis. The remainder of the exam is stable from  the prior study. Electronically Signed    By: Inez Catalina M.D.   On: 03/11/2017 07:39    EKG:   Orders placed or performed during the hospital encounter of 03/09/2017  . EKG 12-Lead  . EKG 12-Lead  . EKG 12-Lead  . EKG 12-Lead    ASSESSMENT AND PLAN:   61 year old male with past medical history significant for metastatic left renal cell carcinoma, hypertension, anxiety, congestive heart failure presents to hospital secondary to altered mental status.  #1 sepsis-secondary to urinary tract infection - Blood and urine cultures growing staph aureus. Patient was on nafcillin. -Also was on Levophed for his septic shock. However medications are held due to comfort measures.  #2 acute renal failure-renal function was normal last week, renal failure is acute with creatinine of 3.4 on adm. -Renal ultrasound with no obstruction. Carcinomatous left kidney noted. Medico-renal disease in right -Renal function improved with fluids and treatment of underlying sepsis. However patient is [place] now Nephrology consultation is appreciated  #3 acute respiratory failure- failed BiPAP. Secondary to sepsis -Currently intubated and on ventilator. -Failed spontaneous breathing trial twice today. Family decided for comfort care  #4 metastatic renal cell carcinoma-following with oncology. Diagnosed in 2015 left renal cell carcinoma -Has lung metastatic disease. 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 -Was on chemotherapy with cabozantinib-which has been held for the last 2 weeks. -Also on chronic pain medications with MS Contin and oxycodone  Critically ill and overall poor prognosis. Remains on morphine drip. Remains intubated awaiting family arrival.   All the records are reviewed and case discussed with Care Management/Social Workerr. Management plans discussed with the patient, family and they are in agreement.  CODE STATUS: Full code  TOTAL TIME TAKING CARE OF THIS PATIENT:  22 minutes.   POSSIBLE D/C IN ? DAYS, DEPENDING ON CLINICAL CONDITION.   Gladstone Lighter M.D on 03/12/2017 at 2:44 PM  Between 7am to 6pm - Pager - [phone]  After 6pm go to [email or web address] - password [email or web address] [facility]  Office  [phone]  CC: Primary care physician; [name], MD

## 2017-03-12 NOTE — Progress Notes (Signed)
   03/12/17 1800  Clinical Encounter Type  Visited With Family  Visit Type Follow-up;Spiritual support;Social support  Referral From Nurse  Spiritual Encounters  Spiritual Needs Emotional  [place] visited with family at bedside; emotional and spiritual support offered.  Family contacting two sons deployed in [place]; [facility] will monitor as needed

## 2017-03-12 NOTE — Telephone Encounter (Signed)
Wife wants Dr Mike Gip to know that patient is ICU 54

## 2017-03-12 NOTE — Progress Notes (Signed)
[facility]  ROUNDING NOTE   Subjective:   Tmax 101.3  UOP 2225  Creatinine 1.53 (1.84) (2.49)  Na 149  Off pressors.   Objective:  Vital signs in last 24 hours:  Temp:  [99.1 F (37.3 C)-101.3 F (38.5 C)] 101.3 F (38.5 C) (09/24 0900) Pulse Rate:  [101-135] 130 (09/24 0900) Resp:  [0-24] 23 (09/24 0900) BP: (88-182)/(57-100) 140/81 (09/24 0900) SpO2:  [88 %-100 %] 88 % (09/24 0900) FiO2 (%):  [30 %] 30 % (09/24 0842)  Weight change:  Filed Weights   03/08/2017 2337 03/10/17 0200  Weight: 95.3 kg (210 lb) 95.9 kg (211 lb 6.7 oz)    Intake/Output: I/O last 3 completed shifts: In: 7342 [I.V.:3650; NG/GT:30; IV Piggyback:900] Out: 8768 [Urine:3575; Emesis/NG output:550]   Intake/Output this shift:  Total I/O In: 238.7 [I.V.:238.7] Out: 325 [Urine:325]  Physical Exam: General: Critically illl    Head: ETT, +OGT to suction  Eyes: Anicteric  Neck:  trachea midline  Lungs:  Scattered rhonchi, PRVC FiO2 30%  Heart: S1S2 no rubs  Abdomen:  Soft, nontender, bowel sounds present  Extremities: 2+ peripheral edema.  Neurologic: Arousable, does nod yes/no to questions  Skin: No lesions       Basic Metabolic Panel:  Recent Labs Lab 03/10/17 0244 03/10/17 1121 03/10/17 1537 03/11/17 0515 03/12/17 0457  NA 139 139 140 143 149*  K 4.1 3.9 4.0 3.7 3.3*  CL 104 108 109 113* 119*  CO2 23 20* 19* 21* 24  GLUCOSE 153* 137* 124* 134* 129*  BUN 90* 94* 93* 84* 68*  CREATININE 3.02* 2.71* 2.49* 1.84* 1.53*  CALCIUM 9.0 8.3* 8.1* 8.0* 8.3*  MG 2.0  --   --   --  2.2  PHOS 3.6  --   --   --   --     Liver Function Tests:  Recent Labs Lab 03/18/2017 2220 03/10/17 1537 03/11/17 0515  AST 39 56* 45*  ALT 19 23 21   ALKPHOS 94 76 68  BILITOT 0.7 0.8 1.8*  PROT 6.3* 5.2* 5.1*  ALBUMIN 2.4* 1.8* 1.8*    Recent Labs Lab 03/13/2017 2220  LIPASE 11   No results for input(s): AMMONIA in the last 168 hours.  CBC:  Recent Labs Lab 03/14/2017 2220  03/10/17 0244 03/12/17 0457  WBC 7.6 5.1 6.8  NEUTROABS 6.5  --   --   HGB 11.8* 11.1* 8.4*  HCT 35.5* 33.3* 25.1*  MCV 90.2 88.7 86.2  PLT 116* 116* 83*    Cardiac Enzymes:  Recent Labs Lab 03/08/2017 2220 03/10/17 0245 03/10/17 1121 03/10/17 1537  TROPONINI 0.24* 0.29* 0.11* 0.09*    BNP: Invalid input(s): POCBNP  CBG:  Recent Labs Lab 03/11/17 0636 03/11/17 1150 03/11/17 1814 03/11/17 2356 03/12/17 0636  GLUCAP 133* 127* 137* 120* 98    Microbiology: Results for orders placed or performed during the hospital encounter of 03/04/2017  Blood Culture (routine x 2)     Status: Abnormal   Collection Time: 02/24/2017 10:10 PM  Result Value Ref Range Status   Specimen Description BLOOD RIGHT ANTECUBITAL  Final   Special Requests   Final    BOTTLES DRAWN AEROBIC AND ANAEROBIC Blood Culture adequate volume   Culture  Setup Time   Final    GRAM POSITIVE COCCI IN BOTH AEROBIC AND ANAEROBIC BOTTLES CRITICAL RESULT CALLED TO, READ BACK BY AND VERIFIED WITH: SCOTT CHRISTY ON 03/10/17 AT 1220 [facility]    Culture (A)  Final  STAPHYLOCOCCUS AUREUS SUSCEPTIBILITIES PERFORMED ON PREVIOUS CULTURE WITHIN THE LAST 5 DAYS. Performed at [facility], [place] [address] [place], [place] [address]    Report Status 03/12/2017 FINAL  Final  Blood Culture (routine x 2)     Status: Abnormal   Collection Time: 02/22/2017 10:30 PM  Result Value Ref Range Status   Specimen Description BLOOD BLOOD RIGHT ARM  Final   Special Requests   Final    BOTTLES DRAWN AEROBIC AND ANAEROBIC Blood Culture results may not be optimal due to an excessive volume of blood received in culture bottles   Culture  Setup Time   Final    IN BOTH AEROBIC AND ANAEROBIC BOTTLES GRAM POSITIVE COCCI CRITICAL RESULT CALLED TO, READ BACK BY AND VERIFIED WITH: SCOTT CHRISTY ON 03/10/17 AT 1220 [facility]    Culture STAPHYLOCOCCUS AUREUS (A)  Final   Report Status 03/12/2017 FINAL  Final   Organism ID, Bacteria STAPHYLOCOCCUS  AUREUS  Final      Susceptibility   Staphylococcus aureus - MIC*    CIPROFLOXACIN <=0.5 SENSITIVE Sensitive     ERYTHROMYCIN >=8 RESISTANT Resistant     GENTAMICIN <=0.5 SENSITIVE Sensitive     OXACILLIN 0.5 SENSITIVE Sensitive     TETRACYCLINE <=1 SENSITIVE Sensitive     VANCOMYCIN <=0.5 SENSITIVE Sensitive     TRIMETH/SULFA <=10 SENSITIVE Sensitive     CLINDAMYCIN <=0.25 SENSITIVE Sensitive     RIFAMPIN <=0.5 SENSITIVE Sensitive     Inducible Clindamycin NEGATIVE Sensitive     * STAPHYLOCOCCUS AUREUS  Blood Culture ID Panel (Reflexed)     Status: Abnormal   Collection Time: 03/11/2017 10:30 PM  Result Value Ref Range Status   Enterococcus species NOT DETECTED NOT DETECTED Final   Listeria monocytogenes NOT DETECTED NOT DETECTED Final   Staphylococcus species DETECTED (A) NOT DETECTED Final    Comment: CRITICAL RESULT CALLED TO, READ BACK BY AND VERIFIED WITH: SCOTT CHRISTY ON 03/10/17 AT 1220 [place]    Staphylococcus aureus DETECTED (A) NOT DETECTED Final    Comment: Methicillin (oxacillin) susceptible Staphylococcus aureus (MSSA). Preferred therapy is anti staphylococcal beta lactam antibiotic (Cefazolin or Nafcillin), unless clinically contraindicated. CRITICAL RESULT CALLED TO, READ BACK BY AND VERIFIED WITH: SCOTT CHRISTY ON 03/10/17 AT 1220 [place]    Methicillin resistance NOT DETECTED NOT DETECTED Final   Streptococcus species NOT DETECTED NOT DETECTED Final   Streptococcus agalactiae NOT DETECTED NOT DETECTED Final   Streptococcus pneumoniae NOT DETECTED NOT DETECTED Final   Streptococcus pyogenes NOT DETECTED NOT DETECTED Final   Acinetobacter baumannii NOT DETECTED NOT DETECTED Final   Enterobacteriaceae species NOT DETECTED NOT DETECTED Final   Enterobacter cloacae complex NOT DETECTED NOT DETECTED Final   Escherichia coli NOT DETECTED NOT DETECTED Final   Klebsiella oxytoca NOT DETECTED NOT DETECTED Final   Klebsiella pneumoniae NOT DETECTED NOT DETECTED Final   Proteus  species NOT DETECTED NOT DETECTED Final   Serratia marcescens NOT DETECTED NOT DETECTED Final   Haemophilus influenzae NOT DETECTED NOT DETECTED Final   Neisseria meningitidis NOT DETECTED NOT DETECTED Final   Pseudomonas aeruginosa NOT DETECTED NOT DETECTED Final   Candida albicans NOT DETECTED NOT DETECTED Final   Candida glabrata NOT DETECTED NOT DETECTED Final   Candida krusei NOT DETECTED NOT DETECTED Final   Candida parapsilosis NOT DETECTED NOT DETECTED Final   Candida tropicalis NOT DETECTED NOT DETECTED Final  Urine culture     Status: Abnormal   Collection Time: 03/10/17 12:30 AM  Result Value  Ref Range Status   Specimen Description URINE, CATHETERIZED  Final   Special Requests NONE  Final   Culture >=100,000 COLONIES/mL STAPHYLOCOCCUS AUREUS (A)  Final   Report Status 03/12/2017 FINAL  Final   Organism ID, Bacteria STAPHYLOCOCCUS AUREUS (A)  Final      Susceptibility   Staphylococcus aureus - MIC*    CIPROFLOXACIN <=0.5 SENSITIVE Sensitive     GENTAMICIN <=0.5 SENSITIVE Sensitive     NITROFURANTOIN <=16 SENSITIVE Sensitive     OXACILLIN 0.5 SENSITIVE Sensitive     TETRACYCLINE <=1 SENSITIVE Sensitive     VANCOMYCIN <=0.5 SENSITIVE Sensitive     TRIMETH/SULFA <=10 SENSITIVE Sensitive     CLINDAMYCIN <=0.25 SENSITIVE Sensitive     RIFAMPIN <=0.5 SENSITIVE Sensitive     Inducible Clindamycin NEGATIVE Sensitive     * >=100,000 COLONIES/mL STAPHYLOCOCCUS AUREUS  MRSA PCR Screening     Status: None   Collection Time: 03/10/17  2:22 AM  Result Value Ref Range Status   MRSA by PCR NEGATIVE NEGATIVE Final    Comment:        The GeneXpert MRSA Assay (FDA approved for NASAL specimens only), is one component of a comprehensive MRSA colonization surveillance program. It is not intended to diagnose MRSA infection nor to guide or monitor treatment for MRSA infections.   Culture, respiratory (NON-Expectorated)     Status: None (Preliminary result)   Collection Time: 03/10/17   9:00 AM  Result Value Ref Range Status   Specimen Description TRACHEAL ASPIRATE  Final   Special Requests NONE  Final   Gram Stain   Final    FEW WBC PRESENT, PREDOMINANTLY MONONUCLEAR RARE SQUAMOUS EPITHELIAL CELLS PRESENT RARE GRAM POSITIVE COCCI IN PAIRS IN SINGLES    Culture   Final    CULTURE REINCUBATED FOR BETTER GROWTH Performed at [facility], [place] [address], [place], [place] [address]    Report Status PENDING  Incomplete    Coagulation Studies:  Recent Labs  03/05/2017 2220  LABPROT 17.0*  INR 1.40    Urinalysis:  Recent Labs  03/10/17 0030  COLORURINE AMBER*  LABSPEC 1.010  PHURINE 7.0  GLUCOSEU NEGATIVE  HGBUR MODERATE*  BILIRUBINUR NEGATIVE  KETONESUR NEGATIVE  PROTEINUR 100*  NITRITE NEGATIVE  LEUKOCYTESUR LARGE*      Imaging: Dg Chest 1 View  Result Date: 03/12/2017 CLINICAL DATA:  Dyspnea EXAM: CHEST 1 VIEW COMPARISON:  03/11/2017 FINDINGS: Endotracheal tube tip is at the clavicular heads. An orogastric tube reaches the stomach at least. There is a right subclavian central line with tip obscured by spinal hardware, likely ending at the SVC level. There is a low volume chest with streaky opacities at the bases. Stable heart size, distorted by rotation. No edema, visible effusion, or pneumothorax. Missing posterior left rib, possibly thoracotomy for the patient's corpectomy. IMPRESSION: 1. Stable positioning of tubes and central line. 2. Low volume chest with atelectasis or pneumonia, unchanged. Electronically Signed   By: Monte Fantasia M.D.   On: 03/12/2017 07:11   Dg Chest 1 View  Result Date: 03/11/2017 CLINICAL DATA:  Dyspnea EXAM: CHEST 1 VIEW COMPARISON:  03/10/2017 FINDINGS: Cardiac shadow is stable. Postsurgical changes are again noted. Endotracheal tube and nasogastric catheter are seen in satisfactory position. Right subclavian central line is again seen and stable although the tip is obscured by the orthopedic hardware. The lungs  again demonstrate bibasilar atelectatic changes right greater than left. No other focal abnormality is seen. IMPRESSION: Persistent basilar atelectasis. The remainder  of the exam is stable from the prior study. Electronically Signed   By: Inez Catalina M.D.   On: 03/11/2017 07:39   US Renal  Result Date: 03/10/2017 CLINICAL DATA:  Acute renal failure. EXAM: RENAL / URINARY TRACT ULTRASOUND COMPLETE COMPARISON:  None. FINDINGS: Right Kidney: Length: 14.5 cm. The right kidney demonstrates a lobular contour and increased cortical echogenicity. No suspicious mass or hydronephrosis. Left Kidney: Length: 14.9 cm. The patient's known left renal mass measuring 9.4 x 5.7 x 8.4 cm is again identified. No hydronephrosis. Bladder: The bladder is decompressed with a Foley catheter. There is bladder wall thickening, also seen on the CT scan from May 2018. IMPRESSION: 1. Known left-sided renal malignancy. 2. Findings on the right are consistent with medical renal disease. No hydronephrosis bilaterally. 3. Decompressed thick walled bladder, similar in appearance since the Oct 20, 2016 CT scan. Electronically Signed   By: Dorise Bullion III M.D   On: 03/10/2017 11:24   Dg Abd Portable 1v  Result Date: 03/10/2017 CLINICAL DATA:  Encounter for orogastric tube placement. EXAM: PORTABLE ABDOMEN - 1 VIEW COMPARISON:  None. FINDINGS: Orogastric tube is in the left upper abdomen and likely within the stomach body. The stomach is distended with gas. Surgical hardware in the thoracic spine. There is a central venous catheter with the tip near the right atrium. Left basilar chest densities. IMPRESSION: Orogastric tube is in the stomach.  Gaseous distention of stomach. Central line tip is near the right atrium. Electronically Signed   By: Markus Daft M.D.   On: 03/10/2017 11:49     Medications:   . sodium chloride 100 mL/hr at 03/12/17 0800  . fentaNYL infusion INTRAVENOUS 50 mcg/hr (03/12/17 0838)  . nafcillin IV Stopped  (03/12/17 0413)  . norepinephrine (LEVOPHED) Adult infusion Stopped (03/11/17 0800)  . phenylephrine (NEO-SYNEPHRINE) Adult infusion     . chlorhexidine gluconate (MEDLINE KIT)  15 mL Mouth Rinse BID  . heparin  5,000 Units Subcutaneous Q8H  . ipratropium-albuterol  3 mL Nebulization Q6H  . mouth rinse  15 mL Mouth Rinse 10 times per day  . pantoprazole (PROTONIX) IV  40 mg Intravenous Daily   acetaminophen **OR** acetaminophen, bisacodyl, fentaNYL, midazolam, ondansetron **OR** ondansetron (ZOFRAN) IV, sennosides  Assessment/ Plan:  61 y.o.white male with GERD, anxiety, congestive heart failure, depression, hyperlipidemia, hypertension, myocardial infarction, left renal cell carcinoma on active chemotherapy, who was [facility] to [facility] 03/18/2017 for evaluation of sepsis secondary to pneumonia. MSSA on 9/21. Placed on nafcillin  1.  Acute renal failure: baseline creatinine 0.96 on 03/02/17 Peak creatinine 3.47. Nonoliguric urine output Left renal mass.  - Creatinine improving.  - Hold IV fluids  2. Hypernatremia: with free water deficit.  - Start tube feeds today with free water boluses. Discussed with dietician.   3.  Anemia with renal failure: hemoglobin 8.4. Not a candidate of EPO due to active malignancy.  . 4.  Renal cell carcinoma, on the left.    5. Sepsis: with pneumonia: MSSA on blood culture on 9/21 - nafcillin.    LOS: [place], Waller Marcussen 9/24/20189:52 AM

## 2017-03-13 DIAGNOSIS — F329 Major depressive disorder, single episode, unspecified: Secondary | ICD-10-CM

## 2017-03-13 DIAGNOSIS — Z9221 Personal history of antineoplastic chemotherapy: Secondary | ICD-10-CM

## 2017-03-13 DIAGNOSIS — Z923 Personal history of irradiation: Secondary | ICD-10-CM

## 2017-03-13 DIAGNOSIS — C649 Malignant neoplasm of unspecified kidney, except renal pelvis: Secondary | ICD-10-CM

## 2017-03-13 DIAGNOSIS — C7951 Secondary malignant neoplasm of bone: Secondary | ICD-10-CM

## 2017-03-13 DIAGNOSIS — I252 Old myocardial infarction: Secondary | ICD-10-CM

## 2017-03-13 DIAGNOSIS — L8990 Pressure ulcer of unspecified site, unspecified stage: Secondary | ICD-10-CM | POA: Insufficient documentation

## 2017-03-13 DIAGNOSIS — J9600 Acute respiratory failure, unspecified whether with hypoxia or hypercapnia: Secondary | ICD-10-CM

## 2017-03-13 DIAGNOSIS — I509 Heart failure, unspecified: Secondary | ICD-10-CM

## 2017-03-13 DIAGNOSIS — B181 Chronic viral hepatitis B without delta-agent: Secondary | ICD-10-CM

## 2017-03-13 DIAGNOSIS — Z8781 Personal history of (healed) traumatic fracture: Secondary | ICD-10-CM

## 2017-03-13 DIAGNOSIS — K219 Gastro-esophageal reflux disease without esophagitis: Secondary | ICD-10-CM

## 2017-03-13 DIAGNOSIS — Z86718 Personal history of other venous thrombosis and embolism: Secondary | ICD-10-CM

## 2017-03-13 DIAGNOSIS — Z85828 Personal history of other malignant neoplasm of skin: Secondary | ICD-10-CM

## 2017-03-13 DIAGNOSIS — B9561 Methicillin susceptible Staphylococcus aureus infection as the cause of diseases classified elsewhere: Secondary | ICD-10-CM

## 2017-03-13 DIAGNOSIS — Z7901 Long term (current) use of anticoagulants: Secondary | ICD-10-CM

## 2017-03-13 DIAGNOSIS — Z79899 Other long term (current) drug therapy: Secondary | ICD-10-CM

## 2017-03-13 DIAGNOSIS — E785 Hyperlipidemia, unspecified: Secondary | ICD-10-CM

## 2017-03-13 DIAGNOSIS — F419 Anxiety disorder, unspecified: Secondary | ICD-10-CM

## 2017-03-13 DIAGNOSIS — J984 Other disorders of lung: Secondary | ICD-10-CM

## 2017-03-13 DIAGNOSIS — Z66 Do not resuscitate: Secondary | ICD-10-CM

## 2017-03-13 DIAGNOSIS — R918 Other nonspecific abnormal finding of lung field: Secondary | ICD-10-CM

## 2017-03-13 LAB — CULTURE, RESPIRATORY

## 2017-03-13 LAB — CULTURE, RESPIRATORY W GRAM STAIN

## 2017-03-13 MED ORDER — ENOXAPARIN SODIUM 40 MG/0.4ML ~~LOC~~ SOLN
40.0000 mg | SUBCUTANEOUS | Status: DC
  Administered 2017-03-13 – 2017-03-14 (×2): 40 mg via SUBCUTANEOUS
  Filled 2017-03-13 (×2): qty 0.4

## 2017-03-13 MED ORDER — FAMOTIDINE IN NACL 20-0.9 MG/50ML-% IV SOLN
20.0000 mg | Freq: Two times a day (BID) | INTRAVENOUS | Status: DC
  Administered 2017-03-13 – 2017-03-16 (×7): 20 mg via INTRAVENOUS
  Filled 2017-03-13 (×7): qty 50

## 2017-03-13 MED ORDER — NAFCILLIN SODIUM 2 G IJ SOLR
2.0000 g | INTRAVENOUS | Status: DC
  Administered 2017-03-13 – 2017-03-16 (×19): 2 g via INTRAVENOUS
  Filled 2017-03-13 (×24): qty 2000

## 2017-03-13 NOTE — Progress Notes (Signed)
[facility] Progess Note    SYNOPSIS   61 year old male with metastatic renal cell carcinoma presenting with urosepsis, septic shock, acute respiratory failure and acute renal failure, septic shock.   Patient with very poor prognosis.    ASSESSMENT/PLAN    PULMONARY A:acute respiratory failure, possible pneumonia. Vent dependent P:   Patient failed SAT/SBT due to resp muscle fatigue Will NOT attempt again due to increased WOB   CARDIOVASCULAR A: septic shock with hypotension.UTI versus pneumonia P:  Continue to wean down pressors as tolerated.    RENAL A:  Acute kidney inary to septic shock P:   Continue IV fluids. Appears improving.  GASTROINTESTINAL A:   P:   Tube feeds. Continue GI prophylaxis.  HEMATOLOGIC A: left renal cancer with mets to lung and spine s/p multiple courses of chemo and multiple surgeries.    INFECTIOUS A:  Septic shock. HBV, HCV positive.  P:    Micro/culture results:  BCx2 staph aureus UC staph aureus Sputum pending  Antibiotics: nafcillin  ENDOCRINE A: Glucose controlled.   NEUROLOGIC A:  Sedated, on vent.     MAJOR EVENTS/TEST RESULTS: 09/22; admitted.  09/23; failed weaning trial due to WOB.  09/24 failed weaning trial due to resp muscle fatigue Best Practices  DVT Prophylaxis: heparin SQ.  GI Prophylaxis: PPI   ---------------------------------------   ----------------------------------------   Name: Marc Blake MRN: [id number] DOB: 25-Nov-1955    ADMISSION DATE:  02/21/2017  SUBJECTIVE:  Pt currently on the ventilator, can not provide history or review of systems.  Remains critically ill Multiorgan failure Can not wean from vent today  Review of Systems:  Can not provide ROS due to critical illness.     VITAL SIGNS: Temp:  [100.8 F (38.2 C)-101.7 F (38.7 C)] 101.7 F (38.7 C) (09/25 0730) Pulse Rate:  [89-166] 95 (09/25 0730) Resp:  [0-30] 20 (09/25  0730) BP: (110-193)/(68-116) 121/75 (09/25 0730) SpO2:  [88 %-96 %] 96 % (09/25 0730) FiO2 (%):  [30 %] 30 % (09/25 0430) Weight:  [199 lb 11.8 oz (90.6 kg)] 199 lb 11.8 oz (90.6 kg) (09/25 0500)     Physical Examination:   VS: BP 121/75 (BP Location: Left Arm)   Pulse 95   Temp (!) 101.7 F (38.7 C) (Core (Comment))   Resp 20   Ht 6\' 3"  (1.905 m)   Wt 199 lb 11.8 oz (90.6 kg)   SpO2 96%   BMI 24.97 kg/m     General Appearance: +distress Neuro GCS<8T HEENT: PERRLA, EOM intact. Pulmonary: normal breath sounds , +rhonchi CardiovascularNormal S1,S2.  No m/r/g.   Abdomen: Benign, Soft, non-tender. Renal:  No costovertebral tenderness  GU:  Not performed at this time. Endocrine: No evident thyromegaly. Skin:   warm, no rashes, no ecchymosis  Extremities: normal, no cyanosis, clubbing.  LABORATORY PANEL:   CBC  Recent Labs Lab 03/12/17 0457  WBC 6.8  HGB 8.4*  HCT 25.1*  PLT 83*    Chemistries   Recent Labs Lab 03/10/17 0244  03/11/17 0515 03/12/17 0457  NA 139  < > 143 149*  K 4.1  < > 3.7 3.3*  CL 104  < > 113* 119*  CO2 23  < > 21* 24  GLUCOSE 153*  < > 134* 129*  BUN 90*  < > 84* 68*  CREATININE 3.02*  < > 1.84* 1.53*  CALCIUM 9.0  < > 8.0* 8.3*  MG 2.0  --   --  2.2  PHOS 3.6  --   --   --   AST  --   < > 45*  --   ALT  --   < > 21  --   ALKPHOS  --   < > 68  --   BILITOT  --   < > 1.8*  --   < > = values in this interval not displayed.   Recent Labs Lab 03/10/17 2353 03/11/17 0636 03/11/17 1150 03/11/17 1814 03/11/17 2356 03/12/17 0636  GLUCAP 115* 133* 127* 137* 120* 98    Recent Labs Lab 03/10/17 0516 03/10/17 0900 03/11/17 0500  PHART 7.20* 7.23* 7.31*  PCO2ART 53* 43 43  PO2ART 63* 93 98    Recent Labs Lab 03/11/2017 2220 03/10/17 1537 03/11/17 0515  AST 39 56* 45*  ALT 19 23 21   ALKPHOS 94 76 68  BILITOT 0.7 0.8 1.8*  ALBUMIN 2.4* 1.8* 1.8*    Cardiac Enzymes  Recent Labs Lab 03/10/17 1537  TROPONINI  0.09*     Critical Care Time devoted to patient care services described in this note is 43 minutes.   Overall, patient is critically ill, prognosis is guarded.  Patient with Multiorgan failure and at high risk for cardiac arrest and death.    Corrin Parker, M.D.  [facility]  Medical Director [place] Director [facility] Cardio-Pulmonary Department

## 2017-03-13 NOTE — Progress Notes (Signed)
Wife is waiting for rest of family to arrive, CMO on hold for now, The wife would like to speak with Dr Mike Gip. Prognosis is very poor.

## 2017-03-13 NOTE — Progress Notes (Signed)
[name]    MR#:  [id number]  DATE OF BIRTH:  1955-12-25  SUBJECTIVE:  CHIEF COMPLAINT:   Chief Complaint  Patient presents with  . Code Sepsis   -Patient with left renal cell carcinoma with metastatic disease, admitted with altered mental status and respiratory failure and septic shock - currently remains on vent- family decided to change to comfort care- after discussing with oncology [name] today  Daughter and wife at bedside  REVIEW OF SYSTEMS:  Review of Systems  Unable to perform ROS: Critical illness    DRUG ALLERGIES:  No Known Allergies  VITALS:  Blood pressure 128/85, pulse (!) 113, temperature (!) 101.7 F (38.7 C), temperature source Core (Comment), resp. rate 20, height 6\' 3"  (1.905 m), weight 90.6 kg (199 lb 11.8 oz), SpO2 96 %.  PHYSICAL EXAMINATION:  Physical Exam  GENERAL:  61 y.o.-year-old patient lying in the bed, Sedated, critically ill appearing.  EYES: Pupils equal, round, sluggish reaction to light. No scleral icterus. Extraocular muscles intact.  HEENT: Head atraumatic, normocephalic. Oropharynx and nasopharynx clear.  NECK:  Supple, no jugular venous distention. No thyroid enlargement, no tenderness.  LUNGS: diminished breath sounds bilaterally, no wheezing, rales,rhonchi or crepitation. No use of accessory muscles of respiration. ,on vent CARDIOVASCULAR: S1, S2 normal. No murmurs, rubs, or gallops.  ABDOMEN: Soft, nontender, nondistended. Bowel sounds present. No organomegaly or mass.  EXTREMITIES: No pedal edema, cyanosis, or clubbing.  NEUROLOGIC: Sedated and intubated PSYCHIATRIC: The patient is sedated.  SKIN: No obvious rash, lesion, or ulcer.    LABORATORY PANEL:   CBC  Recent Labs Lab 03/12/17 0457  WBC 6.8  HGB 8.4*  HCT 25.1*  PLT 83*    ------------------------------------------------------------------------------------------------------------------  Chemistries   Recent Labs Lab 03/11/17 0515 03/12/17 0457  NA 143 149*  K 3.7 3.3*  CL 113* 119*  CO2 21* 24  GLUCOSE 134* 129*  BUN 84* 68*  CREATININE 1.84* 1.53*  CALCIUM 8.0* 8.3*  MG  --  2.2  AST 45*  --   ALT 21  --   ALKPHOS 68  --   BILITOT 1.8*  --    ------------------------------------------------------------------------------------------------------------------  Cardiac Enzymes  Recent Labs Lab 03/10/17 1537  TROPONINI 0.09*   ------------------------------------------------------------------------------------------------------------------  RADIOLOGY:  Dg Chest 1 View  Result Date: 03/12/2017 CLINICAL DATA:  Dyspnea EXAM: CHEST 1 VIEW COMPARISON:  03/11/2017 FINDINGS: Endotracheal tube tip is at the clavicular heads. An orogastric tube reaches the stomach at least. There is a right subclavian central line with tip obscured by spinal hardware, likely ending at the SVC level. There is a low volume chest with streaky opacities at the bases. Stable heart size, distorted by rotation. No edema, visible effusion, or pneumothorax. Missing posterior left rib, possibly thoracotomy for the patient's corpectomy. IMPRESSION: 1. Stable positioning of tubes and central line. 2. Low volume chest with atelectasis or pneumonia, unchanged. Electronically Signed   By: Monte Fantasia M.D.   On: 03/12/2017 07:11    EKG:   Orders placed or performed during the hospital encounter of 02/24/2017  . EKG 12-Lead  . EKG 12-Lead  . EKG 12-Lead  . EKG 12-Lead    ASSESSMENT AND PLAN:   61 year old male with past medical history significant for metastatic left renal cell carcinoma, hypertension, anxiety, congestive heart failure presents to hospital secondary to altered mental status.  #1 acute respiratory failure- failed BiPAP. Secondary to sepsis -Currently  intubated  and on ventilator. -Failed spontaneous breathing trial twice today.sepsis-secondary to urinary tract infection - Blood and urine cultures growing staph aureus. Patient was on nafcillin. -Also was on Levophed for his septic shock.   #2 acute renal failure-renal function was normal last week, renal failure is acute with creatinine of 3.4 on adm. -Renal ultrasound with no obstruction. Carcinomatous left kidney noted. Medico-renal disease in right -Renal function improved with fluids and treatment of underlying sepsis.Nephrology consultation is appreciated  #3 sepsis-secondary to urinary tract infection - Blood and urine cultures growing staph aureus. Patient was on nafcillin. -Also was on Levophed for his septic shock.  #4 metastatic renal cell carcinoma-following with oncology. Diagnosed in 2015 left renal cell carcinoma -Has lung metastatic disease. 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 -Was on chemotherapy with cabozantinib-which has been held for the last 2 weeks. -Also on chronic pain medications with MS Contin and oxycodone  Critically ill and overall poor prognosis. Remains on morphine drip. Remains intubated awaiting family to discuss with oncology before making the patient comfort care  All the records are reviewed and case discussed with Care Management/Social Workerr. Management plans discussed with the patient, family by intensivist and they are in agreement.  CODE STATUS: DNR   TOTAL TIME TAKING CARE OF THIS PATIENT: 22 minutes.   POSSIBLE D/C IN ? DAYS, DEPENDING ON CLINICAL CONDITION.   Nicholes Mango M.D on 03/13/2017 at 2:58 PM  Between 7am to 6pm - Pager - [phone]  After 6pm go to [email or web address] - password [email or web address] [facility]  Office  [phone]  CC: Primary care physician; [name], MD

## 2017-03-13 NOTE — Progress Notes (Signed)
[facility]  ROUNDING NOTE   Subjective:   Family meeting at 12 noon  Objective:  Vital signs in last 24 hours:  Temp:  [100.8 F (38.2 C)-101.7 F (38.7 C)] 101.7 F (38.7 C) (09/25 0800) Pulse Rate:  [89-166] 113 (09/25 0800) Resp:  [10-30] 20 (09/25 0800) BP: (110-193)/(68-116) 128/85 (09/25 0800) SpO2:  [88 %-97 %] 96 % (09/25 0803) FiO2 (%):  [30 %] 30 % (09/25 0803) Weight:  [90.6 kg (199 lb 11.8 oz)] 90.6 kg (199 lb 11.8 oz) (09/25 0500)  Weight change:  Filed Weights   02/27/2017 2337 03/10/17 0200 03/13/17 0500  Weight: 95.3 kg (210 lb) 95.9 kg (211 lb 6.7 oz) 90.6 kg (199 lb 11.8 oz)    Intake/Output: I/O last 3 completed shifts: In: 2519 [I.V.:1659; Other:500; NG/GT:60; IV Piggyback:300] Out: 1740 [Urine:3255; Emesis/NG output:200]   Intake/Output this shift:  Total I/O In: -  Out: 225 [Urine:225]  Physical Exam: General: Critically illl    Head: ETT, +OGT to suction  Eyes: Anicteric  Neck:  trachea midline  Lungs:  Scattered rhonchi, PRVC FiO2 30%  Heart: S1S2 no rubs  Abdomen:  Soft, nontender, bowel sounds present  Extremities: 2+ peripheral edema.  Neurologic: Arousable, does nod yes/no to questions  Skin: No lesions       Basic Metabolic Panel:  Recent Labs Lab 03/10/17 0244 03/10/17 1121 03/10/17 1537 03/11/17 0515 03/12/17 0457  NA 139 139 140 143 149*  K 4.1 3.9 4.0 3.7 3.3*  CL 104 108 109 113* 119*  CO2 23 20* 19* 21* 24  GLUCOSE 153* 137* 124* 134* 129*  BUN 90* 94* 93* 84* 68*  CREATININE 3.02* 2.71* 2.49* 1.84* 1.53*  CALCIUM 9.0 8.3* 8.1* 8.0* 8.3*  MG 2.0  --   --   --  2.2  PHOS 3.6  --   --   --   --     Liver Function Tests:  Recent Labs Lab 02/27/2017 2220 03/10/17 1537 03/11/17 0515  AST 39 56* 45*  ALT 19 23 21   ALKPHOS 94 76 68  BILITOT 0.7 0.8 1.8*  PROT 6.3* 5.2* 5.1*  ALBUMIN 2.4* 1.8* 1.8*    Recent Labs Lab 02/24/2017 2220  LIPASE 11   No results for input(s): AMMONIA in the last  168 hours.  CBC:  Recent Labs Lab 03/15/2017 2220 03/10/17 0244 03/12/17 0457  WBC 7.6 5.1 6.8  NEUTROABS 6.5  --   --   HGB 11.8* 11.1* 8.4*  HCT 35.5* 33.3* 25.1*  MCV 90.2 88.7 86.2  PLT 116* 116* 83*    Cardiac Enzymes:  Recent Labs Lab 03/03/2017 2220 03/10/17 0245 03/10/17 1121 03/10/17 1537  TROPONINI 0.24* 0.29* 0.11* 0.09*    BNP: Invalid input(s): POCBNP  CBG:  Recent Labs Lab 03/11/17 0636 03/11/17 1150 03/11/17 1814 03/11/17 2356 03/12/17 0636  GLUCAP 133* 127* 137* 120* 98    Microbiology: Results for orders placed or performed during the hospital encounter of 03/06/2017  Blood Culture (routine x 2)     Status: Abnormal   Collection Time: 02/21/2017 10:10 PM  Result Value Ref Range Status   Specimen Description BLOOD RIGHT ANTECUBITAL  Final   Special Requests   Final    BOTTLES DRAWN AEROBIC AND ANAEROBIC Blood Culture adequate volume   Culture  Setup Time   Final    GRAM POSITIVE COCCI IN BOTH AEROBIC AND ANAEROBIC BOTTLES CRITICAL RESULT CALLED TO, READ BACK BY AND VERIFIED WITH: SCOTT CHRISTY ON 03/10/17  AT 1220 [facility]    Culture (A)  Final    STAPHYLOCOCCUS AUREUS SUSCEPTIBILITIES PERFORMED ON PREVIOUS CULTURE WITHIN THE LAST 5 DAYS. Performed at [facility], [place] [address] [place], [place] [address]    Report Status 03/12/2017 FINAL  Final  Blood Culture (routine x 2)     Status: Abnormal   Collection Time: 02/26/2017 10:30 PM  Result Value Ref Range Status   Specimen Description BLOOD BLOOD RIGHT ARM  Final   Special Requests   Final    BOTTLES DRAWN AEROBIC AND ANAEROBIC Blood Culture results may not be optimal due to an excessive volume of blood received in culture bottles   Culture  Setup Time   Final    IN BOTH AEROBIC AND ANAEROBIC BOTTLES GRAM POSITIVE COCCI CRITICAL RESULT CALLED TO, READ BACK BY AND VERIFIED WITH: SCOTT CHRISTY ON 03/10/17 AT 1220 [facility]    Culture STAPHYLOCOCCUS AUREUS (A)  Final   Report Status 03/12/2017  FINAL  Final   Organism ID, Bacteria STAPHYLOCOCCUS AUREUS  Final      Susceptibility   Staphylococcus aureus - MIC*    CIPROFLOXACIN <=0.5 SENSITIVE Sensitive     ERYTHROMYCIN >=8 RESISTANT Resistant     GENTAMICIN <=0.5 SENSITIVE Sensitive     OXACILLIN 0.5 SENSITIVE Sensitive     TETRACYCLINE <=1 SENSITIVE Sensitive     VANCOMYCIN <=0.5 SENSITIVE Sensitive     TRIMETH/SULFA <=10 SENSITIVE Sensitive     CLINDAMYCIN <=0.25 SENSITIVE Sensitive     RIFAMPIN <=0.5 SENSITIVE Sensitive     Inducible Clindamycin NEGATIVE Sensitive     * STAPHYLOCOCCUS AUREUS  Blood Culture ID Panel (Reflexed)     Status: Abnormal   Collection Time: 03/03/2017 10:30 PM  Result Value Ref Range Status   Enterococcus species NOT DETECTED NOT DETECTED Final   Listeria monocytogenes NOT DETECTED NOT DETECTED Final   Staphylococcus species DETECTED (A) NOT DETECTED Final    Comment: CRITICAL RESULT CALLED TO, READ BACK BY AND VERIFIED WITH: SCOTT CHRISTY ON 03/10/17 AT 1220 [facility]    Staphylococcus aureus DETECTED (A) NOT DETECTED Final    Comment: Methicillin (oxacillin) susceptible Staphylococcus aureus (MSSA). Preferred therapy is anti staphylococcal beta lactam antibiotic (Cefazolin or Nafcillin), unless clinically contraindicated. CRITICAL RESULT CALLED TO, READ BACK BY AND VERIFIED WITH: SCOTT CHRISTY ON 03/10/17 AT 1220 [facility]    Methicillin resistance NOT DETECTED NOT DETECTED Final   Streptococcus species NOT DETECTED NOT DETECTED Final   Streptococcus agalactiae NOT DETECTED NOT DETECTED Final   Streptococcus pneumoniae NOT DETECTED NOT DETECTED Final   Streptococcus pyogenes NOT DETECTED NOT DETECTED Final   Acinetobacter baumannii NOT DETECTED NOT DETECTED Final   Enterobacteriaceae species NOT DETECTED NOT DETECTED Final   Enterobacter cloacae complex NOT DETECTED NOT DETECTED Final   Escherichia coli NOT DETECTED NOT DETECTED Final   Klebsiella oxytoca NOT DETECTED NOT DETECTED Final   Klebsiella  pneumoniae NOT DETECTED NOT DETECTED Final   Proteus species NOT DETECTED NOT DETECTED Final   Serratia marcescens NOT DETECTED NOT DETECTED Final   Haemophilus influenzae NOT DETECTED NOT DETECTED Final   Neisseria meningitidis NOT DETECTED NOT DETECTED Final   Pseudomonas aeruginosa NOT DETECTED NOT DETECTED Final   Candida albicans NOT DETECTED NOT DETECTED Final   Candida glabrata NOT DETECTED NOT DETECTED Final   Candida krusei NOT DETECTED NOT DETECTED Final   Candida parapsilosis NOT DETECTED NOT DETECTED Final   Candida tropicalis NOT DETECTED NOT DETECTED Final  Urine culture  Status: Abnormal   Collection Time: 03/10/17 12:30 AM  Result Value Ref Range Status   Specimen Description URINE, CATHETERIZED  Final   Special Requests NONE  Final   Culture >=100,000 COLONIES/mL STAPHYLOCOCCUS AUREUS (A)  Final   Report Status 03/12/2017 FINAL  Final   Organism ID, Bacteria STAPHYLOCOCCUS AUREUS (A)  Final      Susceptibility   Staphylococcus aureus - MIC*    CIPROFLOXACIN <=0.5 SENSITIVE Sensitive     GENTAMICIN <=0.5 SENSITIVE Sensitive     NITROFURANTOIN <=16 SENSITIVE Sensitive     OXACILLIN 0.5 SENSITIVE Sensitive     TETRACYCLINE <=1 SENSITIVE Sensitive     VANCOMYCIN <=0.5 SENSITIVE Sensitive     TRIMETH/SULFA <=10 SENSITIVE Sensitive     CLINDAMYCIN <=0.25 SENSITIVE Sensitive     RIFAMPIN <=0.5 SENSITIVE Sensitive     Inducible Clindamycin NEGATIVE Sensitive     * >=100,000 COLONIES/mL STAPHYLOCOCCUS AUREUS  MRSA PCR Screening     Status: None   Collection Time: 03/10/17  2:22 AM  Result Value Ref Range Status   MRSA by PCR NEGATIVE NEGATIVE Final    Comment:        The GeneXpert MRSA Assay (FDA approved for NASAL specimens only), is one component of a comprehensive MRSA colonization surveillance program. It is not intended to diagnose MRSA infection nor to guide or monitor treatment for MRSA infections.   Culture, respiratory (NON-Expectorated)     Status:  None (Preliminary result)   Collection Time: 03/10/17  9:00 AM  Result Value Ref Range Status   Specimen Description TRACHEAL ASPIRATE  Final   Special Requests NONE  Final   Gram Stain   Final    FEW WBC PRESENT, PREDOMINANTLY MONONUCLEAR RARE SQUAMOUS EPITHELIAL CELLS PRESENT RARE GRAM POSITIVE COCCI IN PAIRS IN SINGLES    Culture   Final    RARE STAPHYLOCOCCUS AUREUS SUSCEPTIBILITIES TO FOLLOW Performed at [facility], [place] [address], [place], [place] [address]    Report Status PENDING  Incomplete    Coagulation Studies: No results for input(s): LABPROT, INR in the last 72 hours.  Urinalysis: No results for input(s): COLORURINE, LABSPEC, PHURINE, GLUCOSEU, HGBUR, BILIRUBINUR, KETONESUR, PROTEINUR, UROBILINOGEN, NITRITE, LEUKOCYTESUR in the last 72 hours.  Invalid input(s): APPERANCEUR    Imaging: Dg Chest 1 View  Result Date: 03/12/2017 CLINICAL DATA:  Dyspnea EXAM: CHEST 1 VIEW COMPARISON:  03/11/2017 FINDINGS: Endotracheal tube tip is at the clavicular heads. An orogastric tube reaches the stomach at least. There is a right subclavian central line with tip obscured by spinal hardware, likely ending at the SVC level. There is a low volume chest with streaky opacities at the bases. Stable heart size, distorted by rotation. No edema, visible effusion, or pneumothorax. Missing posterior left rib, possibly thoracotomy for the patient's corpectomy. IMPRESSION: 1. Stable positioning of tubes and central line. 2. Low volume chest with atelectasis or pneumonia, unchanged. Electronically Signed   By: Monte Fantasia M.D.   On: 03/12/2017 07:11     Medications:   . famotidine (PEPCID) IV    . fentaNYL infusion INTRAVENOUS 150 mcg/hr (03/13/17 0901)  . morphine Stopped (03/12/17 2023)  . nafcillin IV     . enoxaparin (LOVENOX) injection  40 mg Subcutaneous Q24H  . mouth rinse  15 mL Mouth Rinse 10 times per day   acetaminophen **OR** acetaminophen, LORazepam  Assessment/  Plan:  61 y.o.white male with GERD, anxiety, congestive heart failure, depression, hyperlipidemia, hypertension, myocardial infarction, left renal cell carcinoma  on active chemotherapy, who was admitted to [facility] on 03/01/2017 for evaluation of sepsis secondary to pneumonia. MSSA on 9/21. Placed on nafcillin  1.  Acute renal failure: baseline creatinine 0.96 on 03/02/17 Peak creatinine 3.47. Nonoliguric urine output Left renal mass.  - Creatinine improving.   2. Hypernatremia: with free water deficit.  - tube feeds today with free water boluses.   3.  Anemia with renal failure: hemoglobin 8.4. Sharp drop Not a candidate of EPO due to active malignancy.   4.  Renal cell carcinoma, on the left.    5. Sepsis: with pneumonia: MSSA on blood culture on 9/21 - nafcillin.    LOS: [place], Sila Sarsfield 9/25/20189:27 AM

## 2017-03-13 NOTE — Progress Notes (Signed)
Pt switched off of Morphine gtt and onto Fentanyl gtt due to family wanting to hold on comfort care overnight. Pt is still a DNAR.

## 2017-03-14 ENCOUNTER — Ambulatory Visit: Payer: Medicaid Other

## 2017-03-14 DIAGNOSIS — N179 Acute kidney failure, unspecified: Secondary | ICD-10-CM

## 2017-03-14 DIAGNOSIS — Z515 Encounter for palliative care: Secondary | ICD-10-CM

## 2017-03-14 DIAGNOSIS — Z7189 Other specified counseling: Secondary | ICD-10-CM

## 2017-03-14 LAB — CBC
HEMATOCRIT: 25.7 % — AB (ref 40.0–52.0)
Hemoglobin: 8.4 g/dL — ABNORMAL LOW (ref 13.0–18.0)
MCH: 28.6 pg (ref 26.0–34.0)
MCHC: 32.7 g/dL (ref 32.0–36.0)
MCV: 87.4 fL (ref 80.0–100.0)
PLATELETS: 87 10*3/uL — AB (ref 150–440)
RBC: 2.94 MIL/uL — ABNORMAL LOW (ref 4.40–5.90)
RDW: 25.7 % — AB (ref 11.5–14.5)
WBC: 8.9 10*3/uL (ref 3.8–10.6)

## 2017-03-14 LAB — BASIC METABOLIC PANEL
Anion gap: 6 (ref 5–15)
BUN: 50 mg/dL — AB (ref 6–20)
CALCIUM: 8.4 mg/dL — AB (ref 8.9–10.3)
CO2: 26 mmol/L (ref 22–32)
Chloride: 128 mmol/L — ABNORMAL HIGH (ref 101–111)
Creatinine, Ser: 1.13 mg/dL (ref 0.61–1.24)
GFR calc Af Amer: >60 mL/min (ref >60)
GLUCOSE: 141 mg/dL — AB (ref 65–99)
Potassium: 2.7 mmol/L — CL (ref 3.5–5.1)
Sodium: 160 mmol/L — ABNORMAL HIGH (ref 135–145)

## 2017-03-14 LAB — MAGNESIUM: Magnesium: 2.4 mg/dL (ref 1.7–2.4)

## 2017-03-14 LAB — SODIUM: Sodium: 160 mmol/L — ABNORMAL HIGH (ref 135–145)

## 2017-03-14 LAB — POTASSIUM: Potassium: 3.4 mmol/L — ABNORMAL LOW (ref 3.5–5.1)

## 2017-03-14 MED ORDER — ORAL CARE MOUTH RINSE
15.0000 mL | OROMUCOSAL | Status: DC
  Administered 2017-03-14 – 2017-03-16 (×20): 15 mL via OROMUCOSAL

## 2017-03-14 MED ORDER — LORAZEPAM 2 MG/ML IJ SOLN
2.0000 mg | INTRAMUSCULAR | Status: DC | PRN
  Administered 2017-03-15 – 2017-03-16 (×5): 2 mg via INTRAVENOUS
  Filled 2017-03-14 (×5): qty 1

## 2017-03-14 MED ORDER — POTASSIUM CHLORIDE 2 MEQ/ML IV SOLN
INTRAVENOUS | Status: DC
  Administered 2017-03-14: 10:00:00 via INTRAVENOUS
  Filled 2017-03-14 (×3): qty 1000

## 2017-03-14 MED ORDER — CHLORHEXIDINE GLUCONATE 0.12% ORAL RINSE (MEDLINE KIT)
15.0000 mL | Freq: Two times a day (BID) | OROMUCOSAL | Status: DC
  Administered 2017-03-14 – 2017-03-16 (×5): 15 mL via OROMUCOSAL

## 2017-03-14 MED ORDER — POTASSIUM CHLORIDE 10 MEQ/50ML IV SOLN
10.0000 meq | INTRAVENOUS | Status: AC
  Administered 2017-03-14 (×3): 10 meq via INTRAVENOUS
  Filled 2017-03-14 (×3): qty 50

## 2017-03-14 MED ORDER — POTASSIUM CHLORIDE 10 MEQ/100ML IV SOLN
10.0000 meq | INTRAVENOUS | Status: AC
  Administered 2017-03-14 (×2): 10 meq via INTRAVENOUS
  Filled 2017-03-14 (×2): qty 100

## 2017-03-14 MED ORDER — POTASSIUM CHLORIDE 2 MEQ/ML IV SOLN
INTRAVENOUS | Status: DC
  Administered 2017-03-14: 22:00:00 via INTRAVENOUS
  Filled 2017-03-14 (×3): qty 1000

## 2017-03-14 NOTE — Consult Note (Signed)
Consultation Note Date: 03/14/2017   Patient Name: Marc Blake  DOB: [birth year]  MRN: [id number]  Age / Sex: 61 y.o., male  PCP: [name], MD Referring Physician: Nicholes Mango, MD  Reason for Consultation: Establishing goals of care  HPI/Patient Profile: 61 y.o. male  with past medical history of metastatic renal cell carcinoma, renal insufficiency, myocardial infarction, hypertension, hyperlipidemia, depression, congestive heart failure, arthritis, anxiety, and GERD admitted on 03/11/2017 with confusion. In ED, patient with elevated lactate acid, AKI, and demand ischemia. Remains with septic shock, acute respiratory failure on full support ventilator, and acute renal failure. Followed by [name] for renal call carcinoma with metastases to bone and lung. Notes reviewed. Multiple surgeries, radiation, and chemotherapy. Patient not tolerating SBT's on ventilator. Poor prognosis. Palliative medicine consultation for goals of care.   Clinical Assessment and Goals of Care: I have reviewed medical records, discussed with care team, and met with wife Horris Latino) and daughter Hansel Starling) to discuss diagnosis, prognosis, GOC, EOL wishes, disposition and options.   Introduced Palliative Medicine as specialized medical care for people living with serious illness.  We discussed a brief life review of the patient. Married to wife, Horris Latino, for 24 years. They have 3 children-one currently deployed in [place] and another son who is deploying to [place] soon. 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 Wife and daughter describe him as a strong-willed and hardworking individual. Horris Latino shares his battle with cancer since 2015. She tells me he has had 13 surgeries, trach/feeding tube in 2016 (which he pulled both out), and also radiation and chemotherapy.   Discussed hospital  diagnoses, interventions, and underlying metastatic cancer. Wife and daughter have a good understanding of clinical condition and poor prognosis.   I attempted to elicit values and goals of care important to the patient. Horris Latino tells me her husband and her have had conversations regarding EOL wishes. She tells me he would "not want to live like this" and "this isn't my husband." They have spoken of their wishes to each other of NOT prolonging life with trach/ventilator support. She confirms DNR code status. She speaks of him declining more since he has been hospitalized.   The difference between aggressive medical intervention and comfort care was considered in light of the patient's goals of care. It remains important for wife to wait for son to arrive from [place] before withdrawing care.   I did explain the process of compassionate extubation, not re-intubating after extubation, and transition to comfort measures only. Explained comfort and dignity at EOL with focus on managing symptoms. Wife confirms NO re-intubation after extubation. She understands he will likely die at the hospital. Horris Latino shares concerns with watching him "gasp for breath." I again explained continuous infusion for comfort prior to extubation as well as doses as needed if he is showing signs of distress/discomfort.  Questions and concerns were addressed. Therapeutic listening and emotional/spiritual support provided.    SUMMARY OF RECOMMENDATIONS    DNR  Continue current interventions. Waiting for son to arrive from [place].   Wife does NOT want long-term ventilation. Once son arrives, plan will be compassionate extubation with no re-intubation and transition to comfort measures only.   Wife/daughter understand poor prognosis and likely hospital death once care transitions to comfort measures.  PMT will continue to support patient/family through hospitalization.   Code Status/Advance Care Planning:  DNR  Symptom  Management:   Per attending  Palliative Prophylaxis:   Aspiration, Delirium Protocol, Frequent Pain Assessment, Oral Care and Turn Reposition  Additional Recommendations (Limitations, Scope, Preferences):  Continue current interventions. Waiting on family before compassionate extubation and transition to comfort measures only.   Psycho-social/Spiritual:   Desire for further Chaplaincy support:yes  Additional Recommendations: Caregiving  Support/Resources and Compassionate Wean Education  Prognosis:   Poor prognosis secondary to metastatic renal cell carcinoma, septic shock, MSSA bacteremia, and acute respiratory failure on full support ventilator.   Discharge Planning: To Be Determined likely hospital death       Primary Diagnoses: Present on Admission: . Severe sepsis ([place]) . CAD (coronary artery disease) . Elevated troponin . Essential hypertension . HCAP (healthcare-associated pneumonia) . AKI (acute kidney injury) ([place]) . Acute respiratory failure with hypoxia and hypercapnia ([facility]) . Renal cell carcinoma ([place])   I have reviewed the medical record, interviewed the patient and family, and examined the patient. The following aspects are pertinent.  Past Medical History:  Diagnosis Date  . Acid reflux   . Anxiety   . Arthritis   . CHF (congestive heart failure) ([place])   . Depression   . Depression   . Hyperlipidemia   . Hypertension   . Myocardial infarction ([place])   . Renal cancer ([place])   . Renal cell carcinoma ([place])   . Renal insufficiency   . Skin cancer    Social History   Social History  . Marital status: Married    Spouse name: N/A  . Number of children: N/A  . Years of education: N/A   Social History Main Topics  . Smoking status: Never Smoker  . Smokeless tobacco: Never Used  . 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 Drug use: No  . Sexual activity: Not on file   Other Topics Concern  . Not on file   Social History  Narrative  . No narrative on file   Family History  Problem Relation Age of Onset  . Hypertension Father   . Heart attack Father   . Stroke Sister   . Hypertension Brother   . Stroke Maternal Uncle    Scheduled Meds: . chlorhexidine gluconate (MEDLINE KIT)  15 mL Mouth Rinse BID  . mouth rinse  15 mL Mouth Rinse 10 times per day   Continuous Infusions: . dextrose 5 % with kcl 75 mL/hr at 03/14/17 1003  . famotidine (PEPCID) IV Stopped (03/14/17 1033)  . fentaNYL infusion INTRAVENOUS 275 mcg/hr (03/14/17 1518)  . nafcillin IV Stopped (03/14/17 1305)   PRN Meds:.acetaminophen **OR** acetaminophen, LORazepam Medications Prior to Admission:  Prior to Admission medications   Medication Sig Start Date End Date Taking? Authorizing Provider  amLODipine (NORVASC) 10 MG tablet Take 10 mg by mouth daily. 07/06/16  Yes [provider]  atorvastatin (LIPITOR) 20 MG tablet TAKE 1 TABLET ONCE A DAY (AT BEDTIME) FOR 30 DAYS 11/26/15  Yes [provider]  busPIRone (BUSPAR) 7.5 MG tablet  07/12/16  Yes [provider]  carvedilol (COREG) 12.5 MG  tablet TAKE 1 TABLET BY MOUTH TWICE A DAY FOR 30 DAYS 10/18/15  Yes [provider]  [employer] GENTLE LAXATIVE 10 MG suppository INSERT 1 SUPPOSITORY RECTALLY EVERY 24HRS AS NEEDED FOR CONSTIPATION 07/21/16  Yes [provider]  [employer] STOOL SOFTENER 8.6-50 MG tablet TAKE 1 TABLET BY MOUTH TWICE A DAY AS NEEDED CONSTIPATION 07/21/16  Yes [provider]  fluocinonide cream (LIDEX) 0.05 %  07/21/16  Yes [provider]  gabapentin (NEURONTIN) 300 MG capsule Take 300 mg by mouth 3 (three) times daily.    Yes [provider]  ibuprofen (ADVIL,MOTRIN) 200 MG tablet Take 200 mg by mouth every 8 (eight) hours as needed for moderate pain. 4 every 8 hours   Yes [provider]  ipratropium-albuterol (DUONEB) 0.5-2.5 (3) MG/3ML SOLN  07/21/16  Yes [provider]  lisinopril (PRINIVIL,ZESTRIL) 10 MG  tablet  07/21/16  Yes [provider]  LORazepam (ATIVAN) 0.5 MG tablet Take 1 tablet (0.5 mg total) by mouth every 8 (eight) hours. 02/22/17  Yes Corcoran, Drue Second, MD  morphine (MS CONTIN) 30 MG 12 hr tablet Take 1 tablet (30 mg total) by mouth every 12 (twelve) hours. 02/23/17  Yes Corcoran, Drue Second, MD  oxyCODONE (OXY IR/ROXICODONE) 5 MG immediate release tablet Take 1 tablet (5 mg total) by mouth every 6 (six) hours as needed (every 6 h prn). 02/23/17  Yes Corcoran, Drue Second, MD  potassium chloride SA (K-DUR,KLOR-CON) 20 MEQ tablet Take 1 tablet (20 mEq total) by mouth daily. For 3 days 10/17/16  Yes [name], MD  senna (SENOKOT) 8.6 MG tablet Take 2 tablets by mouth at bedtime.    Yes [provider]  sertraline (ZOLOFT) 100 MG tablet Take 100 mg by mouth at bedtime. 11/13/15  Yes [provider]  tenofovir (VIREAD) 300 MG tablet Take 1 tablet (300 mg total) by mouth daily. 09/13/16  Yes Lucilla Lame, MD  traZODone (DESYREL) 50 MG tablet Take 50 mg by mouth at bedtime.   Yes [provider]  triamcinolone cream (KENALOG) 0.5 % Apply topically 2 (two) times daily. 09/19/16 09/19/17 Yes Finnegan, Kathlene November, MD  XARELTO 20 MG TABS tablet Take 20 mg by mouth daily. 11/16/15  Yes [provider]   No Known Allergies Review of Systems  Unable to perform ROS: Acuity of condition   Physical Exam  Constitutional: He appears ill. He is sedated and intubated.  HENT:  Head: Normocephalic and atraumatic.  Cardiovascular: Regular rhythm.   Pulmonary/Chest: No tachypnea. He is intubated. No respiratory distress. He has decreased breath sounds.  Skin: Skin is warm and dry. There is pallor.  Psychiatric: He is inattentive.   Vital Signs: BP 99/77   Pulse 80   Temp (!) 97.5 F (36.4 C)   Resp 18   Ht 6' 3"  (1.905 m)   Wt 97.1 kg (214 lb 1.1 oz)   SpO2 98%   BMI 26.76 kg/m  Pain Assessment: CPOT   Pain Score: 0-No pain  SpO2: SpO2: 98 % O2  Device:SpO2: 98 % O2 Flow Rate: .O2 Flow Rate (L/min): 15 L/min  IO: Intake/output summary:   Intake/Output Summary (Last 24 hours) at 03/14/17 1623 Last data filed at 03/14/17 1400  Gross per 24 hour  Intake          1937.75 ml  Output             2375 ml  Net          -  437.25 ml    LBM: Last BM Date:  (UTA) Baseline Weight: Weight: 95.3 kg (210 lb) Most recent weight: Weight: 97.1 kg (214 lb 1.1 oz)     Palliative Assessment/Data: PPS 10%   Flowsheet Rows     Most Recent Value  Intake Tab  Referral Department  Critical care  Unit at Time of Referral  ICU  Palliative Care Primary Diagnosis  Sepsis/Infectious Disease  Palliative Care Type  New Palliative care  Reason for referral  Clarify Goals of Care  Date first seen by Palliative Care  03/14/17  Clinical Assessment  Palliative Performance Scale Score  10%  Psychosocial & Spiritual Assessment  Palliative Care Outcomes  Patient/Family meeting held?  Yes  Who was at the meeting?  wife and daughter  Palliative Care Outcomes  Clarified goals of care, Provided end of life care assistance, Provided psychosocial or spiritual support, ACP counseling assistance      Time In: 1250 Time Out: 1400 Time Total: 55mn Greater than 50%  of this time was spent counseling and coordinating care related to the above assessment and plan.  Signed by:  MIhor Dow FNP-C Palliative Medicine Team  Phone: [phone]: [phone]  Please contact Palliative Medicine Team phone at [phone] questions and concerns.  For individual provider: See AShea Evans

## 2017-03-14 NOTE — Progress Notes (Signed)
[facility]  ROUNDING NOTE   Subjective:   UOP 2175  No new labs.   Tmax 102.6  Objective:  Vital signs in last 24 hours:  Temp:  [98.1 F (36.7 C)-102.6 F (39.2 C)] 98.1 F (36.7 C) (09/26 0800) Pulse Rate:  [82-116] 82 (09/26 0530) Resp:  [18-22] 18 (09/26 0800) BP: (115-151)/(84-103) 115/84 (09/26 0800) SpO2:  [95 %-99 %] 99 % (09/26 0734) FiO2 (%):  [30 %] 30 % (09/26 0734) Weight:  [97.1 kg (214 lb 1.1 oz)] 97.1 kg (214 lb 1.1 oz) (09/26 0405)  Weight change: 6.5 kg (14 lb 5.3 oz) Filed Weights   03/10/17 0200 03/13/17 0500 03/14/17 0405  Weight: 95.9 kg (211 lb 6.7 oz) 90.6 kg (199 lb 11.8 oz) 97.1 kg (214 lb 1.1 oz)    Intake/Output: I/O last 3 completed shifts: In: 1097.2 [I.V.:437.2; NG/GT:60; IV Piggyback:600] Out: 3014 [Urine:3380; Emesis/NG output:250]   Intake/Output this shift:  Total I/O In: 200 [IV Piggyback:200] Out: -   Physical Exam: General: Critically illl    Head: ETT, +OGT to suction  Eyes: Anicteric  Neck:  trachea midline, right subclavian central line  Lungs:  Scattered rhonchi, PRVC FiO2 30%  Heart: S1S2 no rubs  Abdomen:  Soft, nontender, bowel sounds present  Extremities: 2+ peripheral edema.  Neurologic: Intubated, sedated  Skin: No lesions  GU: Foley with dark urine    Basic Metabolic Panel:  Recent Labs Lab 03/10/17 0244 03/10/17 1121 03/10/17 1537 03/11/17 0515 03/12/17 0457  NA 139 139 140 143 149*  K 4.1 3.9 4.0 3.7 3.3*  CL 104 108 109 113* 119*  CO2 23 20* 19* 21* 24  GLUCOSE 153* 137* 124* 134* 129*  BUN 90* 94* 93* 84* 68*  CREATININE 3.02* 2.71* 2.49* 1.84* 1.53*  CALCIUM 9.0 8.3* 8.1* 8.0* 8.3*  MG 2.0  --   --   --  2.2  PHOS 3.6  --   --   --   --     Liver Function Tests:  Recent Labs Lab 03/04/2017 2220 03/10/17 1537 03/11/17 0515  AST 39 56* 45*  ALT _0 ALKPHOS 94 76 68  BILITOT 0.7 0.8 1.8*  PROT 6.3* 5.2* 5.1*  ALBUMIN 2.4* 1.8* 1.8*    Recent Labs Lab  02/25/2017 2220  LIPASE 11   No results for input(s): AMMONIA in the last 168 hours.  CBC:  Recent Labs Lab 02/18/2017 2220 03/10/17 0244 03/12/17 0457  WBC 7.6 5.1 6.8  NEUTROABS 6.5  --   --   HGB 11.8* 11.1* 8.4*  HCT 35.5* 33.3* 25.1*  MCV 90.2 88.7 86.2  PLT 116* 116* 83*    Cardiac Enzymes:  Recent Labs Lab 02/22/2017 2220 03/10/17 0245 03/10/17 1121 03/10/17 1537  TROPONINI 0.24* 0.29* 0.11* 0.09*    BNP: Invalid input(s): POCBNP  CBG:  Recent Labs Lab 03/11/17 0636 03/11/17 1150 03/11/17 1814 03/11/17 2356 03/12/17 0636  GLUCAP 133* 127* 137* 120* 98    Microbiology: Results for orders placed or performed during the hospital encounter of 03/01/2017  Blood Culture (routine x 2)     Status: Abnormal   Collection Time: 02/22/2017 10:10 PM  Result Value Ref Range Status   Specimen Description BLOOD RIGHT ANTECUBITAL  Final   Special Requests   Final    BOTTLES DRAWN AEROBIC AND ANAEROBIC Blood Culture adequate volume   Culture  Setup Time   Final    GRAM POSITIVE COCCI IN BOTH AEROBIC AND ANAEROBIC  BOTTLES CRITICAL RESULT CALLED TO, READ BACK BY AND VERIFIED WITH: SCOTT CHRISTY ON 03/10/17 AT 1220 [facility]    Culture (A)  Final    STAPHYLOCOCCUS AUREUS SUSCEPTIBILITIES PERFORMED ON PREVIOUS CULTURE WITHIN THE LAST 5 DAYS. Performed at [facility], [place] [address] [place], [place] [address]    Report Status 03/12/2017 FINAL  Final  Blood Culture (routine x 2)     Status: Abnormal   Collection Time: 03/06/2017 10:30 PM  Result Value Ref Range Status   Specimen Description BLOOD BLOOD RIGHT ARM  Final   Special Requests   Final    BOTTLES DRAWN AEROBIC AND ANAEROBIC Blood Culture results may not be optimal due to an excessive volume of blood received in culture bottles   Culture  Setup Time   Final    IN BOTH AEROBIC AND ANAEROBIC BOTTLES GRAM POSITIVE COCCI CRITICAL RESULT CALLED TO, READ BACK BY AND VERIFIED WITH: SCOTT CHRISTY ON 03/10/17 AT 1220  [facility]    Culture STAPHYLOCOCCUS AUREUS (A)  Final   Report Status 03/12/2017 FINAL  Final   Organism ID, Bacteria STAPHYLOCOCCUS AUREUS  Final      Susceptibility   Staphylococcus aureus - MIC*    CIPROFLOXACIN <=0.5 SENSITIVE Sensitive     ERYTHROMYCIN >=8 RESISTANT Resistant     GENTAMICIN <=0.5 SENSITIVE Sensitive     OXACILLIN 0.5 SENSITIVE Sensitive     TETRACYCLINE <=1 SENSITIVE Sensitive     VANCOMYCIN <=0.5 SENSITIVE Sensitive     TRIMETH/SULFA <=10 SENSITIVE Sensitive     CLINDAMYCIN <=0.25 SENSITIVE Sensitive     RIFAMPIN <=0.5 SENSITIVE Sensitive     Inducible Clindamycin NEGATIVE Sensitive     * STAPHYLOCOCCUS AUREUS  Blood Culture ID Panel (Reflexed)     Status: Abnormal   Collection Time: 03/14/2017 10:30 PM  Result Value Ref Range Status   Enterococcus species NOT DETECTED NOT DETECTED Final   Listeria monocytogenes NOT DETECTED NOT DETECTED Final   Staphylococcus species DETECTED (A) NOT DETECTED Final    Comment: CRITICAL RESULT CALLED TO, READ BACK BY AND VERIFIED WITH: SCOTT CHRISTY ON 03/10/17 AT 1220 [place]    Staphylococcus aureus DETECTED (A) NOT DETECTED Final    Comment: Methicillin (oxacillin) susceptible Staphylococcus aureus (MSSA). Preferred therapy is anti staphylococcal beta lactam antibiotic (Cefazolin or Nafcillin), unless clinically contraindicated. CRITICAL RESULT CALLED TO, READ BACK BY AND VERIFIED WITH: SCOTT CHRISTY ON 03/10/17 AT 1220 [place]    Methicillin resistance NOT DETECTED NOT DETECTED Final   Streptococcus species NOT DETECTED NOT DETECTED Final   Streptococcus agalactiae NOT DETECTED NOT DETECTED Final   Streptococcus pneumoniae NOT DETECTED NOT DETECTED Final   Streptococcus pyogenes NOT DETECTED NOT DETECTED Final   Acinetobacter baumannii NOT DETECTED NOT DETECTED Final   Enterobacteriaceae species NOT DETECTED NOT DETECTED Final   Enterobacter cloacae complex NOT DETECTED NOT DETECTED Final   Escherichia coli NOT DETECTED NOT DETECTED  Final   Klebsiella oxytoca NOT DETECTED NOT DETECTED Final   Klebsiella pneumoniae NOT DETECTED NOT DETECTED Final   Proteus species NOT DETECTED NOT DETECTED Final   Serratia marcescens NOT DETECTED NOT DETECTED Final   Haemophilus influenzae NOT DETECTED NOT DETECTED Final   Neisseria meningitidis NOT DETECTED NOT DETECTED Final   Pseudomonas aeruginosa NOT DETECTED NOT DETECTED Final   Candida albicans NOT DETECTED NOT DETECTED Final   Candida glabrata NOT DETECTED NOT DETECTED Final   Candida krusei NOT DETECTED NOT DETECTED Final   Candida parapsilosis NOT DETECTED NOT DETECTED Final  Candida tropicalis NOT DETECTED NOT DETECTED Final  Urine culture     Status: Abnormal   Collection Time: 03/10/17 12:30 AM  Result Value Ref Range Status   Specimen Description URINE, CATHETERIZED  Final   Special Requests NONE  Final   Culture >=100,000 COLONIES/mL STAPHYLOCOCCUS AUREUS (A)  Final   Report Status 03/12/2017 FINAL  Final   Organism ID, Bacteria STAPHYLOCOCCUS AUREUS (A)  Final      Susceptibility   Staphylococcus aureus - MIC*    CIPROFLOXACIN <=0.5 SENSITIVE Sensitive     GENTAMICIN <=0.5 SENSITIVE Sensitive     NITROFURANTOIN <=16 SENSITIVE Sensitive     OXACILLIN 0.5 SENSITIVE Sensitive     TETRACYCLINE <=1 SENSITIVE Sensitive     VANCOMYCIN <=0.5 SENSITIVE Sensitive     TRIMETH/SULFA <=10 SENSITIVE Sensitive     CLINDAMYCIN <=0.25 SENSITIVE Sensitive     RIFAMPIN <=0.5 SENSITIVE Sensitive     Inducible Clindamycin NEGATIVE Sensitive     * >=100,000 COLONIES/mL STAPHYLOCOCCUS AUREUS  MRSA PCR Screening     Status: None   Collection Time: 03/10/17  2:22 AM  Result Value Ref Range Status   MRSA by PCR NEGATIVE NEGATIVE Final    Comment:        The GeneXpert MRSA Assay (FDA approved for NASAL specimens only), is one component of a comprehensive MRSA colonization surveillance program. It is not intended to diagnose MRSA infection nor to guide or monitor treatment  for MRSA infections.   Culture, respiratory (NON-Expectorated)     Status: None   Collection Time: 03/10/17  9:00 AM  Result Value Ref Range Status   Specimen Description TRACHEAL ASPIRATE  Final   Special Requests NONE  Final   Gram Stain   Final    FEW WBC PRESENT, PREDOMINANTLY MONONUCLEAR RARE SQUAMOUS EPITHELIAL CELLS PRESENT RARE GRAM POSITIVE COCCI IN PAIRS IN SINGLES Performed at [facility], [place] [address], [place], [place] [address]    Culture RARE STAPHYLOCOCCUS AUREUS  Final   Report Status 03/13/2017 FINAL  Final   Organism ID, Bacteria STAPHYLOCOCCUS AUREUS  Final      Susceptibility   Staphylococcus aureus - MIC*    CIPROFLOXACIN <=0.5 SENSITIVE Sensitive     ERYTHROMYCIN >=8 RESISTANT Resistant     GENTAMICIN <=0.5 SENSITIVE Sensitive     OXACILLIN 0.5 SENSITIVE Sensitive     TETRACYCLINE <=1 SENSITIVE Sensitive     VANCOMYCIN 1 SENSITIVE Sensitive     TRIMETH/SULFA <=10 SENSITIVE Sensitive     CLINDAMYCIN <=0.25 SENSITIVE Sensitive     RIFAMPIN <=0.5 SENSITIVE Sensitive     Inducible Clindamycin NEGATIVE Sensitive     * RARE STAPHYLOCOCCUS AUREUS    Coagulation Studies: No results for input(s): LABPROT, INR in the last 72 hours.  Urinalysis: No results for input(s): COLORURINE, LABSPEC, PHURINE, GLUCOSEU, HGBUR, BILIRUBINUR, KETONESUR, PROTEINUR, UROBILINOGEN, NITRITE, LEUKOCYTESUR in the last 72 hours.  Invalid input(s): APPERANCEUR    Imaging: No results found.   Medications:   . famotidine (PEPCID) IV Stopped (03/13/17 2235)  . fentaNYL infusion INTRAVENOUS 250 mcg/hr (03/14/17 0851)  . morphine Stopped (03/12/17 2023)  . nafcillin IV Stopped (03/14/17 0746)  . potassium chloride 10 mEq (03/14/17 0850)   . chlorhexidine gluconate (MEDLINE KIT)  15 mL Mouth Rinse BID  . enoxaparin (LOVENOX) injection  40 mg Subcutaneous Q24H  . mouth rinse  15 mL Mouth Rinse 10 times per day   acetaminophen **OR** acetaminophen,  LORazepam  Assessment/ Plan:  61 y.o.white male  with GERD, anxiety, congestive heart failure, depression, hyperlipidemia, hypertension, myocardial infarction, left renal cell carcinoma on active chemotherapy, who was admitted to [facility] on  for evaluation of sepsis secondary to pneumonia. MSSA on 9/21. Placed on nafcillin  1.  Acute renal failure: baseline creatinine 0.96 on 03/02/17 Peak creatinine 3.47. Nonoliguric urine output Left renal mass.  - Creatinine improving. Labs ordered for today  2. Hypernatremia: with free water deficit.  - tube feeds today with free water boluses.   3.  Anemia with renal failure: hemoglobin 8.4. Sharp drop Not a candidate of EPO due to active malignancy.   4.  Renal cell carcinoma, on the left.   - Appreciate oncology input.   5. Sepsis: with pneumonia: MSSA on blood culture on 9/21 - nafcillin.    LOS: [place], [place] 9/26/20188:55 AM

## 2017-03-14 NOTE — Progress Notes (Signed)
[facility] Progess Note    SYNOPSIS   61 year old male with metastatic renal cell carcinoma presenting with urosepsis, MSSA positive, septic shock, acute respiratory failure and acute renal failure  Patient with very poor prognosis.    ASSESSMENT/PLAN    PULMONARY A: Acute respiratory failure, possible pneumonia Vent dependent P:   Full vent support wean as tolerated Repeat CXR in am  VAP bundle   CARDIOVASCULAR A:  Septic shock with hypotension-resolved   Elevated troponin's likely secondary to demand ischemia  Hx: CHF, MI, and HTN  P:  Maintain map >65 Continuous telemetry monitoring    RENAL A:  Acute kidney inary to septic shock-resolved  Hypokalemia  Hypernatremia  Hx: Renal Cell Carcinoma P:   Trend BMP Replace electrolytes as indicated  Monitor UOP Will start D5W @75  ml/hr  GASTROINTESTINAL A:   No acute issues Hx: GERD  P:   Keep NPO for now  Pepcid for PUD prophylaxis  OG tube LIS   HEMATOLOGIC A:  Left renal cancer with mets to lung and spine s/p multiple courses of chemo and multiple surgeries P: Trend CBC Transfuse for hgb <7 Monitor for s/sx of bleeding  Lovenox for VTE prophylaxis   INFECTIOUS A:   Pneumonia  MSSA Sepsis  HBV and HCV positive  P:   Trend WBC and monitor fever curve  Trend PCT Continue abx  Micro/culture results:  BCx2 09/22>>staph aureus UC 09/22>>staph aureus Sputum 09/22>>staph aureus  Antibiotics: Nafcillin  ENDOCRINE A: No acute issues  P: Monitor serum glucose   NEUROLOGIC A:   Mechanical Ventilation  P: Maintain RASS goal 0 to -1 Continue fentanyl gtt to maintain RASS goal and pain management  WUA daily   MAJOR EVENTS/TEST RESULTS: 09/22; admitted to ICU  09/23; failed weaning trial due to WOB.  09/24 failed weaning trial due to resp muscle fatigue  Best Practices DVT Prophylaxis: Lovenox SQ GI Prophylaxis: Pepcid     ---------------------------------------  Name: ARMEND HOCHSTATTER MRN: [id number] DOB: [birth year]    ADMISSION DATE:  02/27/2017  SUBJECTIVE:   Review of Systems:  Unable to assess pt remains intubated   VITAL SIGNS: Temp:  [97.7 F (36.5 C)-102.6 F (39.2 C)] 97.7 F (36.5 C) (09/26 1000) Pulse Rate:  [80-116] 80 (09/26 0900) Resp:  [18-22] 18 (09/26 1000) BP: (113-151)/(84-103) 113/84 (09/26 1000) SpO2:  [95 %-99 %] 99 % (09/26 1000) FiO2 (%):  [30 %] 30 % (09/26 0734) Weight:  [97.1 kg (214 lb 1.1 oz)] 97.1 kg (214 lb 1.1 oz) (09/26 0405)  Physical Examination:   VS: BP 113/84   Pulse 80   Temp 97.7 F (36.5 C)   Resp 18   Ht 6\' 3"  (1.905 m)   Wt 97.1 kg (214 lb 1.1 oz)   SpO2 99%   BMI 26.76 kg/m    General Appearance: acutely ill appearing Caucasian male mechanically intubated, NAD  Neuro: sedated, follows commands, PERRL HEENT: supple, no JVD Pulmonary: rhonchi throughout, even, non labored  Cardiovascular: s1s2, rrr, no M/R/G   Abdomen: +BS x4, soft, non distended  Skin: warm, no rashes or lesions Extremities: normal, no cyanosis, clubbing.  LABORATORY PANEL:   CBC  Recent Labs Lab 03/14/17 0900  WBC 8.9  HGB 8.4*  HCT 25.7*  PLT 87*    Chemistries   Recent Labs Lab 03/10/17 0244  03/11/17 0515 03/12/17 0457 03/14/17 0900  NA 139  < > 143 149* 160*  K 4.1  < > 3.7 3.3*  2.7*  CL 104  < > 113* 119* 128*  CO2 23  < > 21* 24 26  GLUCOSE 153*  < > 134* 129* 141*  BUN 90*  < > 84* 68* 50*  CREATININE 3.02*  < > 1.84* 1.53* 1.13  CALCIUM 9.0  < > 8.0* 8.3* 8.4*  MG 2.0  --   --  2.2  --   PHOS 3.6  --   --   --   --   AST  --   < > 45*  --   --   ALT  --   < > 21  --   --   ALKPHOS  --   < > 68  --   --   BILITOT  --   < > 1.8*  --   --   < > = values in this interval not displayed.   Recent Labs Lab 03/10/17 2353 03/11/17 0636 03/11/17 1150 03/11/17 1814 03/11/17 2356 03/12/17 0636  GLUCAP 115* 133* 127* 137* 120* 98     Recent Labs Lab 03/10/17 0516 03/10/17 0900 03/11/17 0500  PHART 7.20* 7.23* 7.31*  PCO2ART 53* 43 43  PO2ART 63* 93 98    Recent Labs Lab 02/22/2017 2220 03/10/17 1537 03/11/17 0515  AST 39 56* 45*  ALT 19 23 21   ALKPHOS 94 76 68  BILITOT 0.7 0.8 1.8*  ALBUMIN 2.4* 1.8* 1.8*    Cardiac Enzymes  Recent Labs Lab 03/10/17 1537  TROPONINI 0.09*   -No family present at bedside at this time to update, once pts wife arrives will discuss plan of care.  According to pts nurse the pts son who is in the [employer] and currently [place] has been contacted regarding his fathers current medical status. The pts son should arrive at the hospital in the next 1-3 days.  The pt is a DO NOT RESUSCITATE we will need to discuss if pt is extubated would she want the pt to be reintubated, therefore will hold off on weaning trial until plan of care discussed.    Marda Stalker, [place] Pager [phone] (please enter 7 digits) PCCM Consult Pager [phone] (please enter 7 digits)     STAFF NOTE: I. Dr. Ashby Dawes, have personally reviewed the patient's available data including medical history , events of notes, physican examination and test results as part of my evaluation. I have discussed with the  Care with the NP and other care providers including  pharmacist, ICU RN, RRT, dietary.  Physical Exam Lungs - Decreased air entry bilaterally. Patient currently on ventilator. He is unresponsive, sedated. Remains in acute respiratory failure, vent dependent. Continued septic shock, with some improvement. Remains with acute kidney injury, creatinine, and pro-calcitonin levels are decreasing. We will continue antibiotics. Overall prognosis remains poor given her underlying medical issues. Family was considering making the patient comfort measures only, however, the area. He be waiting for further family to come into town. We'll continue conservative measures for the time  being.  Marda Stalker, MD.   Board Certified in Internal Medicine, Pulmonary Medicine, [place], and Sleep Medicine.  [facility] Office Number: [phone] Pager: [phone]  Patricia Pesa, M.D.  Merton Border, M.D   [place].  I have personally obtained a history, examined the patient, evaluated laboratory and imaging results, formulated the assessment and plan and placed orders. The Patient requires high complexity decision making for assessment and support, frequent evaluation and titration of therapies,  application of advanced monitoring technologies and extensive interpretation of multiple databases. The patient has critical illness that could lead imminently to failure of 1 or more organ systems and requires the highest level of physician preparedness to intervene.  Critical Care Time devoted to patient care services described in this note is 35 minutes and is exclusive of time spent in procedures supervisory time of NP.

## 2017-03-14 NOTE — Progress Notes (Signed)
Pt. Tachycardic 110s-120s and running temp close to 103F, even after previous shift administered Tylenol. NP informed, no new orders at this time. Room temp turned down, ice packs applied to patient, and bath given, covers removed. Pt temp trending down. Temp within normal parameters at end of shift, pt also now remains in NSR. Will continue to monitor.

## 2017-03-14 NOTE — Progress Notes (Signed)
MEDICATION RELATED CONSULT NOTE - INITIAL   Pharmacy Consult for electrolyte monitoring  Indication: hypokalemia   No Known Allergies  Patient Measurements: Height: 6\' 3"  (190.5 cm) Weight: 214 lb 1.1 oz (97.1 kg) IBW/kg (Calculated) : 84.5  Vital Signs: Temp: 97.5 F (36.4 C) (09/26 1100) Temp Source: Core (Comment) (09/26 0400) BP: 107/83 (09/26 1100) Pulse Rate: 80 (09/26 0900) Intake/Output from previous day: 09/25 0701 - 09/26 0700 In: 969.7 [I.V.:369.7; IV Piggyback:600] Out: 2425 [Urine:2175; Emesis/NG output:250] Intake/Output from this shift: Total I/O In: 406.5 [I.V.:56.5; IV Piggyback:350] Out: 550 [Urine:550]  Labs:  Recent Labs  03/12/17 0457 03/14/17 0900 03/14/17 0943  WBC 6.8 8.9  --   HGB 8.4* 8.4*  --   HCT 25.1* 25.7*  --   PLT 83* 87*  --   CREATININE 1.53* 1.13  --   MG 2.2  --  2.4   Estimated Creatinine Clearance: 82 mL/min (by C-G formula based on SCr of 1.13 mg/dL).   Medical History: Past Medical History:  Diagnosis Date  . Acid reflux   . Anxiety   . Arthritis   . CHF (congestive heart failure) ([place])   . Depression   . Depression   . Hyperlipidemia   . Hypertension   . Myocardial infarction ([place])   . Renal cancer ([place])   . Renal cell carcinoma ([place])   . Renal insufficiency   . Skin cancer     Assessment: RR is a 85 YOM with history of GERD, anxiety, arthritis, CHF, depression, HLD, HTN, MI, and renal cell carcinoma with mets that presented on 9/22 with hypoxia and found to have elevated lactate, new AKI and demand ischemia. He was treated per sepsis protocol and admitted. Patient ordered potassiu 83mEq IV Q1hr x 2 doses this am.    Plan:  Ordered potassium chloride 10 mEq in central line q1h x 3 and initiated on D5 with Potassium 72mEq/L at 64mL/hr.   Will recheck potassium and sodium at 1800. Will recheck all electrolytes with am labs.   Pharmacy will continue to follow per consult.   Durwin Reges, PharmD  Student 03/14/2017,11:23 AM

## 2017-03-14 NOTE — Progress Notes (Signed)
[facility] [place] NAME: Marc Blake    MR#:  [id number]  DATE OF BIRTH:  26-Mar-1956  SUBJECTIVE:  CHIEF COMPLAINT:   Chief Complaint  Patient presents with  . Code Sepsis   -Patient with left renal cell carcinoma with metastatic disease, admitted with altered mental status and respiratory failure and septic shock - currently remains on vent- family decided for compassionate extubation and    comfort care measures after son arrives from [place]:  Review of Systems  Unable to perform ROS: Critical illness    DRUG ALLERGIES:  No Known Allergies  VITALS:  Blood pressure 99/77, pulse 80, temperature (!) 97.5 F (36.4 C), resp. rate 18, height 6\' 3"  (1.905 m), weight 97.1 kg (214 lb 1.1 oz), SpO2 98 %.  PHYSICAL EXAMINATION:  Physical Exam  GENERAL:  61 y.o.-year-old patient lying in the bed, Sedated, critically ill appearing.  EYES: Pupils equal, round, sluggish reaction to light. No scleral icterus. Extraocular muscles intact.  HEENT: Head atraumatic, normocephalic. Oropharynx and nasopharynx clear.  NECK:  Supple, no jugular venous distention. No thyroid enlargement, no tenderness.  LUNGS: diminished breath sounds bilaterally, no wheezing, rales,rhonchi or crepitation. No use of accessory muscles of respiration. ,on vent CARDIOVASCULAR: S1, S2 normal. No murmurs, rubs, or gallops.  ABDOMEN: Soft, nontender, nondistended. Bowel sounds present. No organomegaly or mass.  EXTREMITIES: No pedal edema, cyanosis, or clubbing.  NEUROLOGIC: Sedated and intubated PSYCHIATRIC: The patient is sedated.  SKIN: No obvious rash, lesion, or ulcer.    LABORATORY PANEL:   CBC  Recent Labs Lab 03/14/17 0900  WBC 8.9  HGB 8.4*  HCT 25.7*  PLT 87*   ------------------------------------------------------------------------------------------------------------------  Chemistries   Recent Labs Lab 03/11/17 0515   03/14/17 0900 03/14/17 0943  NA 143  < > 160*  --   K 3.7  < > 2.7*  --   CL 113*  < > 128*  --   CO2 21*  < > 26  --   GLUCOSE 134*  < > 141*  --   BUN 84*  < > 50*  --   CREATININE 1.84*  < > 1.13  --   CALCIUM 8.0*  < > 8.4*  --   MG  --   < >  --  2.4  AST 45*  --   --   --   ALT 21  --   --   --   ALKPHOS 68  --   --   --   BILITOT 1.8*  --   --   --   < > = values in this interval not displayed. ------------------------------------------------------------------------------------------------------------------  Cardiac Enzymes  Recent Labs Lab 03/10/17 1537  TROPONINI 0.09*   ------------------------------------------------------------------------------------------------------------------  RADIOLOGY:  No results found.  EKG:   Orders placed or performed during the hospital encounter of 03/05/2017  . EKG 12-Lead  . EKG 12-Lead  . EKG 12-Lead  . EKG 12-Lead    ASSESSMENT AND PLAN:   61 year old male with past medical history significant for metastatic left renal cell carcinoma, hypertension, anxiety, congestive heart failure presents to hospital secondary to altered mental status.  #1 acute respiratory failure- failed BiPAP. Secondary to sepsis -Currently intubated and on ventilator. -Failed spontaneous breathing trials.   #2 acute renal failure-renal function was normal last week, renal failure is acute with creatinine of 3.4 on adm. -Renal ultrasound with no obstruction. Carcinomatous left kidney noted. Medico-renal disease  in right -Renal function improved with fluids and treatment of underlying sepsis.Nephrology consultation is appreciated  #3 sepsis-secondary to urinary tract infection - Blood and urine cultures with staph aureus. Patient was on nafcillin. -Also was on Levophed for his septic shock.  #4 metastatic renal cell carcinoma-following with oncology. Diagnosed in 2015 left renal cell carcinoma -Has lung metastatic disease. 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 -Was on chemotherapy with cabozantinib-which has been held for the last 2 weeks.  Very poor prognosis   Remains intubated while family awaiting for son to arrive from [place]   family decided for compassionate extubation and comfort care measures after son arrives from [place]    All the records are reviewed and case discussed with Care Management/Social Workerr. Management plans discussed with the patient, family by intensivist and they are in agreement.  CODE STATUS: DNR   TOTAL TIME TAKING CARE OF THIS PATIENT: 22 minutes.   POSSIBLE D/C IN ? DAYS, DEPENDING ON CLINICAL CONDITION.   Nicholes Mango M.D on 03/14/2017 at 5:40 PM  Between 7am to 6pm - Pager - [phone]  After 6pm go to [email or web address] - password [email or web address] [facility]  Office  [phone]  CC: Primary care physician; [name], MD

## 2017-03-14 NOTE — Progress Notes (Signed)
[facility] NOTE   Pharmacy Consult for electrolyte monitoring  Indication: hypokalemia   No Known Allergies  Patient Measurements: Height: 6\' 3"  (190.5 cm) Weight: 214 lb 1.1 oz (97.1 kg) IBW/kg (Calculated) : 84.5  Vital Signs: Temp: 98.6 F (37 C) (09/26 2100) Temp Source: Core (Comment) (09/26 2100) BP: 110/77 (09/26 2100) Pulse Rate: 94 (09/26 2100) Intake/Output from previous day: 09/25 0701 - 09/26 0700 In: 969.7 [I.V.:369.7; IV Piggyback:600] Out: 2425 [Urine:2175; Emesis/NG output:250] Intake/Output from this shift: Total I/O In: 305 [I.V.:205; IV Piggyback:100] Out: 110 [Urine:110]  Labs:  Recent Labs  03/12/17 0457 03/14/17 0900 03/14/17 0943  WBC 6.8 8.9  --   HGB 8.4* 8.4*  --   HCT 25.1* 25.7*  --   PLT 83* 87*  --   CREATININE 1.53* 1.13  --   MG 2.2  --  2.4   Estimated Creatinine Clearance: 82 mL/min (by C-G formula based on SCr of 1.13 mg/dL).   Medical History: Past Medical History:  Diagnosis Date  . Acid reflux   . Anxiety   . Arthritis   . CHF (congestive heart failure) ([place])   . Depression   . Depression   . Hyperlipidemia   . Hypertension   . Myocardial infarction ([place])   . Renal cancer ([place])   . Renal cell carcinoma ([place])   . Renal insufficiency   . Skin cancer     Assessment: RR is a 50 YOM with history of GERD, anxiety, arthritis, CHF, depression, HLD, HTN, MI, and renal cell carcinoma with mets that presented on 9/22 with hypoxia and found to have elevated lactate, new AKI and demand ischemia. He was treated per sepsis protocol and admitted. Patient currently ordered D5 with 23mEq of potassium    Plan:  Will transition patient to d5 with potassium 6mEq/L.  Will recheck all electrolytes with am labs.   Pharmacy will continue to follow per consult.   Elgar Scoggins L, PharmD Student 03/14/2017,9:14 PM

## 2017-03-14 NOTE — Consult Note (Addendum)
[facility]  Date of admission:  03/10/2017  Inpatient day:  03/14/2017  Consulting physician: Dr Mortimer Fries   Reason for Consultation:  Family request.  Discuss prognosis.  Chief Complaint: Marc Blake is a 61 y.o. male with metastatic renal cell carcinoma who was admitted with sepsis secondary to pneumonia.  HPI:  The patient was last seen in the medical oncology clinic on 03/02/2017.  At that time, he was feeling better.  Pain was well controlled.  We discussed restarting his cabozantinib at a reduced dose.  He presented to the ER on 03/10/2017 via ambulance with altered mental status and hypoxia.  Lactic acid was elevated.  Blood pressure was low.  He received IV fluids.  He was started on broad spectrum antibiotics.  He was initially placed on BiPAP and admitted to the ICU.  Initial labs revealed a pH 7.13.  BUN was 86 with a Cr 3.47 (baseline 0.96).  Troponin was 0.24.  Blood cultures on 03/18/2017 were positive for staph aureus, methicillin sensitive.  Urine culture grew staph aureus.  He is on Nafcillin.  His renal function has improved (Cr 1.53).  He was intubated secondary to respiratory failure.  He has failed to weaning trials (09/23 and 09/24) secondary to increased work of breathing.   He was temporarily placed on comfort measures.  His wife is waiting for her son's arrival.  He is in the [employer].   Past Medical History:  Diagnosis Date  . Acid reflux   . Anxiety   . Arthritis   . CHF (congestive heart failure) ([place])   . Depression   . Depression   . Hyperlipidemia   . Hypertension   . Myocardial infarction ([place])   . Renal cancer ([place])   . Renal cell carcinoma ([place])   . Renal insufficiency   . Skin cancer     Past Surgical History:  Procedure Laterality Date  . BACK SURGERY    . CARDIAC CATHETERIZATION     with stent  . CYSTOSCOPY WITH BIOPSY  03/06/2016   Procedure: CYSTOSCOPY WITH BIOPSY;  Surgeon: [name], MD;  Location: [facility] ORS;   Service: Urology;;  . [name] WITH URETHRAL DILATATION N/A 03/06/2016   Procedure: CYSTOSCOPY WITH URETHRAL DILATATION WITH CATHETER PLACEMENT;  Surgeon: [name], MD;  Location: [facility] ORS;  Service: Urology;  Laterality: N/A;  . KNEE SURGERY Left   . LEG SURGERY Left   . TONSILLECTOMY      Family History  Problem Relation Age of Onset  . Hypertension Father   . Heart attack Father   . Stroke Sister   . Hypertension Brother   . Stroke Maternal Uncle     Social History:  reports that he has never smoked. He has never used smokeless tobacco. He reports that he does not drink alcohol or use drugs.  He is originally from [place].  He then lived in [place] for 17 years.  He moved to [place] with his wife.  He lost his job of 35 years in early 09/2016.  He has a son in the [employer].  He is accompanied by his wife, Francene Castle his daughter, Elberta Fortis.  Allergies: No Known Allergies  Facility-Administered Medications Prior to Admission  Medication Dose Route Frequency Provider Last Rate Last Dose  . 0.9 %  sodium chloride infusion   Intravenous Once [name], MD       Medications Prior to Admission  Medication Sig Dispense Refill  . amLODipine (NORVASC) 10 MG tablet Take  10 mg by mouth daily.  3  . atorvastatin (LIPITOR) 20 MG tablet TAKE 1 TABLET ONCE A DAY (AT BEDTIME) FOR 30 DAYS  2  . busPIRone (BUSPAR) 7.5 MG tablet     . carvedilol (COREG) 12.5 MG tablet TAKE 1 TABLET BY MOUTH TWICE A DAY FOR 30 DAYS  3  . [employer] GENTLE LAXATIVE 10 MG suppository INSERT 1 SUPPOSITORY RECTALLY EVERY 24HRS AS NEEDED FOR CONSTIPATION  0  . [employer] STOOL SOFTENER 8.6-50 MG tablet TAKE 1 TABLET BY MOUTH TWICE A DAY AS NEEDED CONSTIPATION  0  . fluocinonide cream (LIDEX) 0.05 %     . gabapentin (NEURONTIN) 300 MG capsule Take 300 mg by mouth 3 (three) times daily.     Marland Kitchen ibuprofen (ADVIL,MOTRIN) 200 MG tablet Take 200 mg by mouth every 8 (eight) hours as needed for moderate pain. 4 every 8  hours    . ipratropium-albuterol (DUONEB) 0.5-2.5 (3) MG/3ML SOLN     . lisinopril (PRINIVIL,ZESTRIL) 10 MG tablet     . LORazepam (ATIVAN) 0.5 MG tablet Take 1 tablet (0.5 mg total) by mouth every 8 (eight) hours. 45 tablet 0  . morphine (MS CONTIN) 30 MG 12 hr tablet Take 1 tablet (30 mg total) by mouth every 12 (twelve) hours. 60 tablet 0  . oxyCODONE (OXY IR/ROXICODONE) 5 MG immediate release tablet Take 1 tablet (5 mg total) by mouth every 6 (six) hours as needed (every 6 h prn). 90 tablet 0  . potassium chloride SA (K-DUR,KLOR-CON) 20 MEQ tablet Take 1 tablet (20 mEq total) by mouth daily. For 3 days 30 tablet 0  . senna (SENOKOT) 8.6 MG tablet Take 2 tablets by mouth at bedtime.     . sertraline (ZOLOFT) 100 MG tablet Take 100 mg by mouth at bedtime.  1  . tenofovir (VIREAD) 300 MG tablet Take 1 tablet (300 mg total) by mouth daily. 30 tablet 11  . traZODone (DESYREL) 50 MG tablet Take 50 mg by mouth at bedtime.    . triamcinolone cream (KENALOG) 0.5 % Apply topically 2 (two) times daily. 30 g 1  . XARELTO 20 MG TABS tablet Take 20 mg by mouth daily.  1    Review of Systems: Unable to be obtained as patient intubated and sedated.  Physical Exam:  Blood pressure (!) 122/93, pulse 82, temperature 98.6 F (37 C), resp. rate 18, height 6\' 3"  (1.905 m), weight 214 lb 1.1 oz (97.1 kg), SpO2 99 %.  GENERAL:  Chronically ill appearing gentleman lying in bed in the ICU intubated and ventilated. [Exam performed 03/13/2017] HEAD:  Long gray/white hair with goatee.  Normocephalic, atraumatic, face symmetric, no Cushingoid features. UEA:[id number]. NG in place draining bilious secretions.  Tonguenormal. Mucous membranes moist. RESPIRATORY:Ventilated breaths.  No rales, wheezes or rhonchi. CARDIOVASCULAR:Regular rate andrhythmwithout murmur, rub or gallop.  ABDOMEN:Soft, non-tender, with active bowel sounds, and no hepatosplenomegaly. No masses. SKIN: Tattoos. Eczema. EXTREMITIES:   No skin discoloration or tenderness. No palpable cords. LYMPHNODES: No palpable cervical, supraclavicular, axillary or inguinal adenopathy  NEUROLOGICAL:  Sedated.   Results for orders placed or performed during the hospital encounter of 02/27/2017 (from the past 48 hour(s))  Glucose, capillary     Status: None   Collection Time: 03/12/17  6:36 AM  Result Value Ref Range   Glucose-Capillary 98 65 - 99 mg/dL   Comment 1 Notify RN    Comment 2 Document in Chart    Dg Chest 1 View  Result Date: 03/12/2017 CLINICAL  DATA:  Dyspnea EXAM: CHEST 1 VIEW COMPARISON:  03/11/2017 FINDINGS: Endotracheal tube tip is at the clavicular heads. An orogastric tube reaches the stomach at least. There is a right subclavian central line with tip obscured by spinal hardware, likely ending at the SVC level. There is a low volume chest with streaky opacities at the bases. Stable heart size, distorted by rotation. No edema, visible effusion, or pneumothorax. Missing posterior left rib, possibly thoracotomy for the patient's corpectomy. IMPRESSION: 1. Stable positioning of tubes and central line. 2. Low volume chest with atelectasis or pneumonia, unchanged. Electronically Signed   By: Monte Fantasia M.D.   On: 03/12/2017 07:11    Assessment:  The patient is a 61 y.o.  gentleman with metastatic renal cell carcinoma with urosepsis secondary to staph aureus, methicillin sensitive. He also presented with renal insufficiency (Cr 3.47).  He has respiratory failure and remains ventilated.  He was diagnosed with metastatic renal cell carcinoma in 12/2013.  He had a large left renal mass, rib and vertebral body involvement, and multiple pulmonary nodules.  He developed a pathologic fracture of the left femur.  He is s/p intramedullary nailing on 01/27/2015.  He received 3000 cGy to T7 - T9 and associated ribs beginning 05/11/2014.  He has received Votrient (pazopanib), nivolumab, and cabozantinib (Cometriq).  He began  cabozantinib in 11/2016.  Medication was recently held secondary to side effects with plan to restart at  Reduced dose.   His course has been complicated.  He developed progressive paralysis from the waist down on 07/06/2014.  He underwent left posterolateral thoracotomy with left chest wall resection with T8 corpectomy and interbody fusion with donor bone on 07/06/2014.  Post-operatively, he developed sepsis secondary to a submandibular abscess dental abscess.  He underwent tracheostomy.  He was admitted on 04/26/2015 with aspiration pneumonia.  He underwent removal of prior rod/intramedullary nail, radical resection of left femur, resection of tumor, and left [facility]miarthroplasty at [facility] on 06/16/2016.  Pathology of the left proximal femur revealed metastatic carcinoma compatible with renal cell carcinoma.  Heunderwent C4 corpectomywith C3-5 plating, T6-T10 PSF with tumor decompression, and T8 laminectomyon 10/30/2016. Pathologyof the T8 lesion and C4 lesion confirmed renal cell carcinoma.  He received radiation from 12/05/2016 - 12/21/2016.   Chest, abdomen, and pelvic CT on 10/20/2016 revealed progressive disease with enlarging left renal mass and pulmonary nodules.  Stable lytic lesion in posterior elements of T8.  Bone scan on 10/23/2016 revealed activity identified in the region of prior thoracic spine surgery. Local progression of concern.  There was uptake in the posterior left ninth rib is new since 12/23/2015 corresponds to lytic lesion on recent CT. This is compatible with metastatic disease.  There was uptake in the distal left femur concerning for metastatic involvement.  There was anterior left seventh rib activity also concerning for metastatic deposit.  He has restrictive lung disease secondary to his prior radiation.  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   He developed a left lower extremity DVT on 06/07/2015 and is on long term Xarelto.  Code status is  DNR.  Plan:   1. Oncology:  Discussed current clinical condition with patient's wife.  He was admitted with MSSA sepsis.  Blood and urine cultures were positive.  He initially had renal failure.  Renal function has improved.  He developed respiratory failure.  He has been unable to wean from the ventilator.  She notes that he has been on the ventilator before.  It  is unclear if he will be able to be weaned from the ventilator (defer to Dr. Mortimer Fries).   We discussed his overall prognosis associated with his metastatic renal cell carcinoma.  His disease is not curable.  Treatment is palliative.  He has had multiple surgeries and radiation.  He is currently on 3rd line therapy.  He would be due for follow-up imaging to assess disease response (cabozantinib PFS 7 months).  Discuss continued ongoing support until his son arrives (he is on Advice worker).  It is hopeful that he may be able to wean from the ventilator.   Thank you for allowing me to participate in Marc Blake 's care.  I will follow him closely with you while hospitalized and after discharge in the outpatient department.   [name], MD  03/13/2017, 1:30 PM

## 2017-03-15 ENCOUNTER — Inpatient Hospital Stay: Payer: Medicaid Other

## 2017-03-15 LAB — CBC WITH DIFFERENTIAL/PLATELET
BASOS ABS: 0 10*3/uL (ref 0–0.1)
BASOS PCT: 0 %
EOS PCT: 1 %
Eosinophils Absolute: 0.1 10*3/uL (ref 0–0.7)
HCT: 27 % — ABNORMAL LOW (ref 40.0–52.0)
Hemoglobin: 8.9 g/dL — ABNORMAL LOW (ref 13.0–18.0)
LYMPHS PCT: 3 %
Lymphs Abs: 0.4 10*3/uL — ABNORMAL LOW (ref 1.0–3.6)
MCH: 29.5 pg (ref 26.0–34.0)
MCHC: 33.1 g/dL (ref 32.0–36.0)
MCV: 89 fL (ref 80.0–100.0)
MONO ABS: 0.5 10*3/uL (ref 0.2–1.0)
Monocytes Relative: 4 %
Neutro Abs: 11.6 10*3/uL — ABNORMAL HIGH (ref 1.4–6.5)
Neutrophils Relative %: 92 %
PLATELETS: 106 10*3/uL — AB (ref 150–440)
RBC: 3.04 MIL/uL — ABNORMAL LOW (ref 4.40–5.90)
RDW: 25.7 % — AB (ref 11.5–14.5)
WBC: 12.6 10*3/uL — ABNORMAL HIGH (ref 3.8–10.6)

## 2017-03-15 LAB — COMPREHENSIVE METABOLIC PANEL
ALK PHOS: 72 U/L (ref 38–126)
ALT: 12 U/L — AB (ref 17–63)
AST: 21 U/L (ref 15–41)
Albumin: 1.8 g/dL — ABNORMAL LOW (ref 3.5–5.0)
Anion gap: 6 (ref 5–15)
BILIRUBIN TOTAL: 1.7 mg/dL — AB (ref 0.3–1.2)
BUN: 49 mg/dL — AB (ref 6–20)
CALCIUM: 8.3 mg/dL — AB (ref 8.9–10.3)
CO2: 26 mmol/L (ref 22–32)
CREATININE: 1.04 mg/dL (ref 0.61–1.24)
Chloride: 128 mmol/L — ABNORMAL HIGH (ref 101–111)
GFR calc Af Amer: >60 mL/min (ref >60)
GFR calc non Af Amer: >60 mL/min (ref >60)
GLUCOSE: 148 mg/dL — AB (ref 65–99)
Potassium: 3.5 mmol/L (ref 3.5–5.1)
Sodium: 160 mmol/L — ABNORMAL HIGH (ref 135–145)
TOTAL PROTEIN: 5.8 g/dL — AB (ref 6.5–8.1)

## 2017-03-15 MED ORDER — FREE WATER
200.0000 mL | Freq: Four times a day (QID) | Status: DC
  Administered 2017-03-15 – 2017-03-16 (×3): 200 mL

## 2017-03-15 MED ORDER — POTASSIUM CL IN DEXTROSE 5% 20 MEQ/L IV SOLN
20.0000 meq | INTRAVENOUS | Status: DC
  Administered 2017-03-15 – 2017-03-16 (×3): 20 meq via INTRAVENOUS
  Filled 2017-03-15 (×6): qty 1000

## 2017-03-15 NOTE — Progress Notes (Signed)
Pt attempting to extubate himself with the mitts in place.  Ativan 2mg  IVP administered.

## 2017-03-15 NOTE — Progress Notes (Signed)
   03/15/17 0105  Vitals  Temp 99.3 F (37.4 C)  Temp Source Core  Pulse Rate (!) 103  ECG Heart Rate (!) 102  Resp 12  Oxygen Therapy  SpO2 100 %  O2 Device Ventilator  FiO2 (%) 40 %  Pulse Oximetry Type Continuous  End Tidal CO2 (EtCO2) 35   Pt turned to left side with improvement in his oxygenation

## 2017-03-15 NOTE — Progress Notes (Signed)
[facility] [facility] for electrolyte monitoring   No Known Allergies  Patient Measurements: Height: 6\' 3"  (190.5 cm) Weight: 214 lb 11.7 oz (97.4 kg) IBW/kg (Calculated) : 84.5  Vital Signs: Temp: 100 F (37.8 C) (09/27 1100) Temp Source: Core (Comment) (09/27 0600) BP: 119/80 (09/27 1100) Pulse Rate: 96 (09/27 1100) Intake/Output from previous day: 09/26 0701 - 09/27 0700 In: 3186.5 [I.V.:2186.5; IV Piggyback:900] Out: 3953 [UYEBX:4356; Emesis/NG output:150] Intake/Output from this shift: No intake/output data recorded.  Labs:  Recent Labs  03/14/17 0900 03/14/17 0943 03/15/17 0500  WBC 8.9  --  12.6*  HGB 8.4*  --  8.9*  HCT 25.7*  --  27.0*  PLT 87*  --  106*  CREATININE 1.13  --  1.04  MG  --  2.4  --   ALBUMIN  --   --  1.8*  PROT  --   --  5.8*  AST  --   --  21  ALT  --   --  12*  ALKPHOS  --   --  72  BILITOT  --   --  1.7*   Estimated Creatinine Clearance: 89.1 mL/min (by C-G formula based on SCr of 1.04 mg/dL).   Medical History: Past Medical History:  Diagnosis Date  . Acid reflux   . Anxiety   . Arthritis   . CHF (congestive heart failure) ([place])   . Depression   . Depression   . Hyperlipidemia   . Hypertension   . Myocardial infarction ([place])   . Renal cancer ([place])   . Renal cell carcinoma ([place])   . Renal insufficiency   . Skin cancer     Assessment: RR is a 10 YOM with history of GERD, anxiety, arthritis, CHF, depression, HLD, HTN, MI, and renal cell carcinoma with mets that presented on 9/22 with hypoxia and found to have elevated lactate, new AKI and demand ischemia. He was treated per sepsis protocol and admitted. Patient currently ordered D5 with 51mEq of potassium at 100 mL/hour.   Plan:  Free water 200 mL q6h was added during rounds to correct elevated sodium   Will recheck all electrolytes with am labs.   Pharmacy will continue to follow per consult.   Durwin Reges, PharmD  Student 03/15/2017,11:33 AM

## 2017-03-15 NOTE — Progress Notes (Signed)
[facility] Progess Note    SYNOPSIS   61 year old male with metastatic renal cell carcinoma presenting with urosepsis, MSSA positive, septic shock, acute respiratory failure and acute renal failure  Patient with very poor prognosis.    ASSESSMENT/PLAN    PULMONARY A: Acute respiratory failure, possible pneumonia Vent dependent Images personally reviewed, chest x-ray 9/27; lungs appear to be unremarkable. P:   Full vent support wean as tolerated Repeat CXR in am  VAP bundle   Vent Mode: PRVC FiO2 (%):  [30 %-50 %] 40 % Set Rate:  [18 bmp] 18 bmp Vt Set:  [550 mL] 550 mL PEEP:  [5 cmH20] 5 cmH20  CARDIOVASCULAR A:  Septic shock with hypotension-resolved   Elevated troponin's likely secondary to demand ischemia  Hx: CHF, MI, and HTN  P:  Maintain map >65 Continuous telemetry monitoring    RENAL A:  Acute kidney inary to septic shock-resolved  Hypokalemia  Hypernatremia  Hx: Renal Cell Carcinoma P:   Trend BMP Replace electrolytes as indicated  Monitor UOP Continue D5W @75  ml/hr Add free water replacement per tube.   GASTROINTESTINAL A:   No acute issues Hx: GERD  P:   Keep NPO for now  Pepcid for PUD prophylaxis  OG tube LIS   HEMATOLOGIC A:  Left renal cancer with mets to lung and spine s/p multiple courses of chemo and multiple surgeries P: Trend CBC Transfuse for hgb <7 Monitor for s/sx of bleeding  Lovenox for VTE prophylaxis   INFECTIOUS A:   Pneumonia  MSSA Sepsis  HBV and HCV positive  P:   Trend WBC and monitor fever curve  Trend PCT Continue abx  Micro/culture results:  BCx2 09/22>>staph aureus UC 09/22>>staph aureus Sputum 09/22>>staph aureus  Antibiotics: Nafcillin 7 day course completed 9/27  ENDOCRINE A: No acute issues  P: Monitor serum glucose   NEUROLOGIC A:   Mechanical Ventilation  P: Maintain RASS goal 0 to -1 Continue fentanyl gtt to maintain RASS goal and pain management    WUA daily   MAJOR EVENTS/TEST RESULTS: 09/22; admitted to ICU  09/23; failed weaning trial due to WOB.  09/24 failed weaning trial due to resp muscle fatigue  Best Practices DVT Prophylaxis: Lovenox SQ GI Prophylaxis: Pepcid    ---------------------------------------  Name: Marc Blake MRN: [id number] DOB: 08-Feb-1956    ADMISSION DATE:  02/23/2017  SUBJECTIVE:   Review of Systems:  Unable to assess pt remains intubated   VITAL SIGNS: Temp:  [97.3 F (36.3 C)-99.9 F (37.7 C)] 99.9 F (37.7 C) (09/27 0700) Pulse Rate:  [79-103] 95 (09/27 0700) Resp:  [0-24] 18 (09/27 0700) BP: (91-134)/(64-88) 114/75 (09/27 0700) SpO2:  [84 %-100 %] 97 % (09/27 0729) FiO2 (%):  [30 %-50 %] 40 % (09/27 0729) Weight:  [214 lb 11.7 oz (97.4 kg)] 214 lb 11.7 oz (97.4 kg) (09/27 0416)  Physical Examination:   VS: BP 114/75 (BP Location: Left Arm)   Pulse 95   Temp 99.9 F (37.7 C)   Resp 18   Ht 6\' 3"  (1.905 m)   Wt 214 lb 11.7 oz (97.4 kg)   SpO2 97%   BMI 26.84 kg/m    General Appearance: acutely ill appearing Caucasian male mechanically intubated, NAD  Neuro: sedated, follows commands, PERRL HEENT: supple, no JVD Pulmonary: rhonchi throughout, even, non labored  Cardiovascular: s1s2, rrr, no M/R/G   Abdomen: +BS x4, soft, non distended  Skin: warm, no rashes or lesions Extremities: normal, no cyanosis,  clubbing.  LABORATORY PANEL:   CBC  Recent Labs Lab 03/15/17 0500  WBC 12.6*  HGB 8.9*  HCT 27.0*  PLT 106*    Chemistries   Recent Labs Lab 03/10/17 0244  03/14/17 0943  03/15/17 0500  NA 139  < >  --   < > 160*  K 4.1  < >  --   < > 3.5  CL 104  < >  --   --  128*  CO2 23  < >  --   --  26  GLUCOSE 153*  < >  --   --  148*  BUN 90*  < >  --   --  49*  CREATININE 3.02*  < >  --   --  1.04  CALCIUM 9.0  < >  --   --  8.3*  MG 2.0  < > 2.4  --   --   PHOS 3.6  --   --   --   --   AST  --   < >  --   --  21  ALT  --   < >  --   --  12*  ALKPHOS   --   < >  --   --  72  BILITOT  --   < >  --   --  1.7*  < > = values in this interval not displayed.   Recent Labs Lab 03/10/17 2353 03/11/17 0636 03/11/17 1150 03/11/17 1814 03/11/17 2356 03/12/17 0636  GLUCAP 115* 133* 127* 137* 120* 98    Recent Labs Lab 03/10/17 0516 03/10/17 0900 03/11/17 0500  PHART 7.20* 7.23* 7.31*  PCO2ART 53* 43 43  PO2ART 63* 93 98    Recent Labs Lab 03/10/17 1537 03/11/17 0515 03/15/17 0500  AST 56* 45* 21  ALT 23 21 12*  ALKPHOS 76 68 72  BILITOT 0.8 1.8* 1.7*  ALBUMIN 1.8* 1.8* 1.8*    Cardiac Enzymes  Recent Labs Lab 03/10/17 1537  TROPONINI 0.09*    According to pts nurse the pts son who is in the [employer] and currently [place] has been contacted regarding his fathers current medical status. The pts son should arrive at the hospital in the next 1-3 days.  The pt is a DO NOT RESUSCITATE we will need to discuss if pt is extubated would she want the pt to be reintubated, therefore will hold off on weaning trial until plan of care discussed.      Physical Exam  HENT:  Head: Normocephalic.  Eyes: Pupils are equal, round, and reactive to light.  Neck: Normal range of motion.  Cardiovascular: Regular rhythm.   Pulmonary/Chest: No respiratory distress. He has no wheezes. He has no rales.  Abdominal: Soft.   Lungs - Decreased air entry bilaterally. Patient currently on ventilator. He is unresponsive, sedated.   Marda Stalker, MD.   Board Certified in Internal Medicine, Pulmonary Medicine, [place], and Sleep Medicine.  [facility] Office Number: [phone] Pager: [phone]  Patricia Pesa, M.D.  Merton Border, M.D   [place].  I have personally obtained a history, examined the patient, evaluated laboratory and imaging results, formulated the assessment and plan and placed orders. The Patient requires high complexity decision making for assessment and support,  frequent evaluation and titration of therapies, application of advanced monitoring technologies and extensive interpretation of multiple databases. The patient has critical illness that could lead imminently to failure of  1 or more organ systems and requires the highest level of physician preparedness to intervene.  Critical Care Time devoted to patient care services described in this note is 32 minutes and is exclusive of time spent in procedures supervisory time of NP.

## 2017-03-15 NOTE — Progress Notes (Signed)
Called by RN for pt desats. Hyper oxygenated and suctioned moderate amount of tan secretions. ETT secure, vent parameters within normal limits, pt in no apparent distress. Pt was placed on 40%, pt did not maintain SPO2 above 92%. Pt turned on his right side, SPO2 increased to 100%. Will wean pt FIO2 as tol.

## 2017-03-15 NOTE — Progress Notes (Signed)
Nutrition Brief Note  Once son arrives from [place], plan is for transition to comfort measures only. Tube feeds have not been initiated for this reason.  No further nutrition interventions warranted at this time. Will continue to monitor plan of care. Please consult RD as needed.  Willey Blade, MS, RD, LDN Pager: [phone] After Hours Pager: [phone]

## 2017-03-15 NOTE — Progress Notes (Signed)
[facility] [place] NAME: Mouhamed Glassco    MR#:  [id number]  DATE OF BIRTH:  07-07-1955  SUBJECTIVE:  CHIEF COMPLAINT:   Chief Complaint  Patient presents with  . Code Sepsis   -Patient with left renal cell carcinoma with metastatic disease, admitted with altered mental status and respiratory failure and septic shock - currently remains on vent- family decided for compassionate extubation and comfort care measures after son arrives from [place], No family members  at bedside today during my examination   REVIEW OF SYSTEMS:  Review of Systems  Unable to perform ROS: Critical illness    DRUG ALLERGIES:  No Known Allergies  VITALS:  Blood pressure 99/66, pulse 97, temperature (!) 100.4 F (38 C), resp. rate 18, height 6\' 3"  (1.905 m), weight 97.4 kg (214 lb 11.7 oz), SpO2 99 %.  PHYSICAL EXAMINATION:  Physical Exam  GENERAL:  61 y.o.-year-old patient lying in the bed, Sedated, critically ill appearing.  EYES: Pupils equal, round, sluggish reaction to light. No scleral icterus. Extraocular muscles intact.  HEENT: Head atraumatic, normocephalic. Oropharynx and nasopharynx clear.  NECK:  Supple, no jugular venous distention. No thyroid enlargement, no tenderness.  LUNGS: diminished breath sounds bilaterally, no wheezing, rales,rhonchi or crepitation. No use of accessory muscles of respiration. ,on vent CARDIOVASCULAR: S1, S2 normal. No murmurs, rubs, or gallops.  ABDOMEN: Soft, nontender, nondistended. Bowel sounds present. No organomegaly or mass.  EXTREMITIES: No pedal edema, cyanosis, or clubbing.  NEUROLOGIC: Sedated and intubated PSYCHIATRIC: The patient is sedated.  SKIN: No obvious rash, lesion, or ulcer.    LABORATORY PANEL:   CBC  Recent Labs Lab 03/15/17 0500  WBC 12.6*  HGB 8.9*  HCT 27.0*  PLT 106*    ------------------------------------------------------------------------------------------------------------------  Chemistries   Recent Labs Lab 03/14/17 0943  03/15/17 0500  NA  --   < > 160*  K  --   < > 3.5  CL  --   --  128*  CO2  --   --  26  GLUCOSE  --   --  148*  BUN  --   --  49*  CREATININE  --   --  1.04  CALCIUM  --   --  8.3*  MG 2.4  --   --   AST  --   --  21  ALT  --   --  12*  ALKPHOS  --   --  72  BILITOT  --   --  1.7*  < > = values in this interval not displayed. ------------------------------------------------------------------------------------------------------------------  Cardiac Enzymes  Recent Labs Lab 03/10/17 1537  TROPONINI 0.09*   ------------------------------------------------------------------------------------------------------------------  RADIOLOGY:  Dg Chest Port 1 View  Result Date: 03/15/2017 CLINICAL DATA:  Respiratory failure. EXAM: PORTABLE CHEST 1 VIEW COMPARISON:  03/12/2017 . FINDINGS: Endotracheal tube, NG tube, right subclavian line in stable position. Cardiomegaly with normal pulmonary vascularity. Progression of bibasilar atelectasis/infiltrates noted. Small left pleural effusion. No pneumothorax . Prior thoracic spine fusion. Thoracic spine scoliosis. IMPRESSION: 1. Lines and tubes in stable position. 2. Progressive bibasilar atelectasis/infiltrates . Small left pleural effusion . Electronically Signed   By: Marcello Moores  Register   On: 03/15/2017 06:38    EKG:   Orders placed or performed during the hospital encounter of 03/02/2017  . EKG 12-Lead  . EKG 12-Lead  . EKG 12-Lead  . EKG 12-Lead    ASSESSMENT AND PLAN:   61 year old male with past medical history significant  for metastatic left renal cell carcinoma, hypertension, anxiety, congestive heart failure presents to hospital secondary to altered mental status.  # acute respiratory failure- failed BiPAP. Secondary to sepsis -Currently intubated and on  ventilator. -Failed spontaneous breathing trials. -fentanyl gtt   # acute renal failure-renal function was normal last week, renal failure is acute with creatinine of 3.4 on adm. -Renal ultrasound with no obstruction. Carcinomatous left kidney noted. Medico-renal disease in right -Renal function improved with fluids and treatment of underlying sepsis. -Nephrology is following  # sepsis-secondary to urinary tract infection - Blood and urine cultures with staph aureus. Patient was on nafcillin. -Also was on Levophed for his septic shock.  # metastatic renal cell carcinoma-following with oncology. Diagnosed in 2015 left renal cell carcinoma -Has lung metastatic disease. 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 -Was on chemotherapy with cabozantinib-which has been held for the last 2 weeks.  Very poor prognosis   Remains intubated while family awaiting for son to arrive from [place]   family decided for compassionate extubation and comfort care measures after son arrives from [place]    All the records are reviewed and case discussed with Care Management/Social Workerr. Management plans discussed with the patient, family by intensivist and they are in agreement.  CODE STATUS: DNR   TOTAL TIME TAKING CARE OF THIS PATIENT: 22 minutes.   POSSIBLE D/C IN ? DAYS, DEPENDING ON CLINICAL CONDITION.   Nicholes Mango M.D on 03/15/2017 at 3:07 PM  Between 7am to 6pm - Pager - [phone]  After 6pm go to [email or web address] - password [email or web address] [facility]  Office  [phone]  CC: Primary care physician; [name], MD

## 2017-03-16 ENCOUNTER — Inpatient Hospital Stay: Payer: Medicaid Other

## 2017-03-16 DIAGNOSIS — R7881 Bacteremia: Secondary | ICD-10-CM

## 2017-03-16 DIAGNOSIS — R6521 Severe sepsis with septic shock: Secondary | ICD-10-CM

## 2017-03-16 DIAGNOSIS — C799 Secondary malignant neoplasm of unspecified site: Secondary | ICD-10-CM

## 2017-03-16 DIAGNOSIS — N179 Acute kidney failure, unspecified: Secondary | ICD-10-CM

## 2017-03-16 LAB — CBC
HEMATOCRIT: 24.4 % — AB (ref 40.0–52.0)
HEMOGLOBIN: 8 g/dL — AB (ref 13.0–18.0)
MCH: 29 pg (ref 26.0–34.0)
MCHC: 32.9 g/dL (ref 32.0–36.0)
MCV: 88.2 fL (ref 80.0–100.0)
Platelets: 106 10*3/uL — ABNORMAL LOW (ref 150–440)
RBC: 2.77 MIL/uL — AB (ref 4.40–5.90)
RDW: 25.1 % — ABNORMAL HIGH (ref 11.5–14.5)
WBC: 12.2 10*3/uL — ABNORMAL HIGH (ref 3.8–10.6)

## 2017-03-16 LAB — BASIC METABOLIC PANEL
Anion gap: 6 (ref 5–15)
BUN: 40 mg/dL — ABNORMAL HIGH (ref 6–20)
CHLORIDE: 126 mmol/L — AB (ref 101–111)
CO2: 25 mmol/L (ref 22–32)
Calcium: 7.8 mg/dL — ABNORMAL LOW (ref 8.9–10.3)
Creatinine, Ser: 1.12 mg/dL (ref 0.61–1.24)
GFR calc non Af Amer: >60 mL/min (ref >60)
Glucose, Bld: 156 mg/dL — ABNORMAL HIGH (ref 65–99)
POTASSIUM: 3.4 mmol/L — AB (ref 3.5–5.1)
SODIUM: 157 mmol/L — AB (ref 135–145)

## 2017-03-16 MED ORDER — FREE WATER
225.0000 mL | Freq: Four times a day (QID) | Status: DC
  Administered 2017-03-16 (×2): 225 mL

## 2017-03-16 MED ORDER — HYDROMORPHONE HCL 1 MG/ML IJ SOLN
1.0000 mg | INTRAMUSCULAR | Status: DC | PRN
  Administered 2017-03-16: 2 mg via INTRAVENOUS
  Filled 2017-03-16: qty 2

## 2017-03-16 MED ORDER — LORAZEPAM BOLUS VIA INFUSION
2.0000 mg | INTRAVENOUS | Status: DC | PRN
  Administered 2017-03-16 (×2): 5 mg via INTRAVENOUS
  Filled 2017-03-16: qty 5

## 2017-03-16 MED ORDER — FENTANYL 2500MCG IN NS 250ML (10MCG/ML) PREMIX INFUSION
400.0000 ug/h | INTRAVENOUS | Status: DC
  Administered 2017-03-16 (×3): 600 ug/h via INTRAVENOUS
  Filled 2017-03-16 (×2): qty 250

## 2017-03-16 MED ORDER — LORAZEPAM 2 MG/ML IJ SOLN
5.0000 mg/h | INTRAVENOUS | Status: DC
  Administered 2017-03-16 (×2): 5 mg/h via INTRAVENOUS
  Filled 2017-03-16 (×2): qty 25

## 2017-03-16 MED ORDER — SCOPOLAMINE 1 MG/3DAYS TD PT72
1.0000 | MEDICATED_PATCH | TRANSDERMAL | Status: DC
  Administered 2017-03-16: 1.5 mg via TRANSDERMAL
  Filled 2017-03-16: qty 1

## 2017-03-16 MED ORDER — POTASSIUM CL IN DEXTROSE 5% 20 MEQ/L IV SOLN
20.0000 meq | INTRAVENOUS | Status: DC
  Administered 2017-03-16: 20 meq via INTRAVENOUS
  Filled 2017-03-16: qty 1000

## 2017-03-16 MED ORDER — FENTANYL BOLUS VIA INFUSION
50.0000 ug | INTRAVENOUS | Status: DC | PRN
  Administered 2017-03-16 (×4): 100 ug via INTRAVENOUS
  Administered 2017-03-16 (×2): 200 ug via INTRAVENOUS
  Filled 2017-03-16: qty 200

## 2017-03-16 MED ORDER — GLYCOPYRROLATE 0.2 MG/ML IJ SOLN
0.1000 mg | INTRAMUSCULAR | Status: DC | PRN
  Administered 2017-03-16: 0.1 mg via INTRAVENOUS
  Filled 2017-03-16: qty 1

## 2017-03-19 ENCOUNTER — Ambulatory Visit: Payer: Medicaid Other

## 2017-03-19 NOTE — Progress Notes (Signed)
Chaplain received an order to visit with pt family in room [facility]. Pt is going to be taken off of life support. Chaplain provided [employer] of prayer, emotional and grief support. Chaplain is available for follow up as needed.     Apr 09, 2017 1250  Clinical Encounter Type  Visited With Patient;Patient and family together  Visit Type Initial;Spiritual support  Referral From Nurse  Consult/Referral To Chaplain  Spiritual Encounters  Spiritual Needs Prayer;Emotional;Grief support

## 2017-03-19 NOTE — Death Summary Note (Signed)
DEATH SUMMARY   Patient Details  Name: Marc Blake MRN: [id number] DOB: 1956-06-11  Admission/Discharge Information   Admit Date:  Mar 22, 2017  Date of Death: Date of Death: 29-Mar-2017  Time of Death: Time of Death: 10-Sep-1939  Length of Stay: 6  Referring Physician: [name], MD   Reason(s) for Hospitalization  Septic shock  Acute renal failure Acute respiratory failure Severe lactic acidosis Sinus tachycardia HCAP Elevated troponin  Diagnoses  Preliminary cause of death:  Secondary Diagnoses (including complications and co-morbidities):  Principal Problem:   Severe sepsis ([place]) Active Problems:   Renal cell carcinoma ([facility])   CAD (coronary artery disease)   Essential hypertension   Elevated troponin   HCAP (healthcare-associated pneumonia)   AKI (acute kidney injury) ([place])   Acute respiratory failure with hypoxemia ([place])   Pressure injury of skin   Palliative care by specialist   Brief Hospital Course (including significant findings, care, treatment, and services provided and events leading to death)  Marc Blake is a 60 y.o. year old male who was admitted with altered mental status, found to be in severe septic shock and admitted to the ICU on BiPAP. He failed BiPAP and was emergently intubated. He was maintained on full vent support and pressors. Patient developed multiorgan failure. Based on his comorbidity burden and poor prognosis, in consultation with the medical team, patient was terminally extubated and made comfort care. Patient expired at [facility]  Significant Diagnostic Studies Dg Chest 1 View  Result Date: 03/12/2017 CLINICAL DATA:  Dyspnea EXAM: CHEST 1 VIEW COMPARISON:  03/11/2017 FINDINGS: Endotracheal tube tip is at the clavicular heads. An orogastric tube reaches the stomach at least. There is a right subclavian central line with tip obscured by spinal hardware, likely ending at the SVC level. There is a low volume  chest with streaky opacities at the bases. Stable heart size, distorted by rotation. No edema, visible effusion, or pneumothorax. Missing posterior left rib, possibly thoracotomy for the patient's corpectomy. IMPRESSION: 1. Stable positioning of tubes and central line. 2. Low volume chest with atelectasis or pneumonia, unchanged. Electronically Signed   By: Monte Fantasia M.D.   On: 03/12/2017 07:11   Dg Chest 1 View  Result Date: 03/11/2017 CLINICAL DATA:  Dyspnea EXAM: CHEST 1 VIEW COMPARISON:  03/10/2017 FINDINGS: Cardiac shadow is stable. Postsurgical changes are again noted. Endotracheal tube and nasogastric catheter are seen in satisfactory position. Right subclavian central line is again seen and stable although the tip is obscured by the orthopedic hardware. The lungs again demonstrate bibasilar atelectatic changes right greater than left. No other focal abnormality is seen. IMPRESSION: Persistent basilar atelectasis. The remainder of the exam is stable from the prior study. Electronically Signed   By: Inez Catalina M.D.   On: 03/11/2017 07:39   US Renal  Result Date: 03/10/2017 CLINICAL DATA:  Acute renal failure. EXAM: RENAL / URINARY TRACT ULTRASOUND COMPLETE COMPARISON:  None. FINDINGS: Right Kidney: Length: 14.5 cm. The right kidney demonstrates a lobular contour and increased cortical echogenicity. No suspicious mass or hydronephrosis. Left Kidney: Length: 14.9 cm. The patient's known left renal mass measuring 9.4 x 5.7 x 8.4 cm is again identified. No hydronephrosis. Bladder: The bladder is decompressed with a Foley catheter. There is bladder wall thickening, also seen on the CT scan from May 2018. IMPRESSION: 1. Known left-sided renal malignancy. 2. Findings on the right are consistent with medical renal disease. No hydronephrosis bilaterally. 3. Decompressed thick walled  bladder, similar in appearance since the Oct 20, 2016 CT scan. Electronically Signed   By: Dorise Bullion III M.D   On:  03/10/2017 11:24   Dg Chest Port 1 View  Result Date: 03/15/2017 CLINICAL DATA:  Respiratory failure. EXAM: PORTABLE CHEST 1 VIEW COMPARISON:  03/12/2017 . FINDINGS: Endotracheal tube, NG tube, right subclavian line in stable position. Cardiomegaly with normal pulmonary vascularity. Progression of bibasilar atelectasis/infiltrates noted. Small left pleural effusion. No pneumothorax . Prior thoracic spine fusion. Thoracic spine scoliosis. IMPRESSION: 1. Lines and tubes in stable position. 2. Progressive bibasilar atelectasis/infiltrates . Small left pleural effusion . Electronically Signed   By: Marcello Moores  Register   On: 03/15/2017 06:38   Dg Chest Port 1 View  Addendum Date: 03/10/2017   ADDENDUM REPORT: 03/10/2017 10:39 ADDENDUM: The impression includes a typographical error. The impression should read: No pneumothorax following central line placement on the right. The remainder of the exam is stable. Electronically Signed   By: Inez Catalina M.D.   On: 03/10/2017 10:39   Result Date: 03/10/2017 CLINICAL DATA:  Check central line placement EXAM: PORTABLE CHEST 1 VIEW COMPARISON:  03/10/2017 FINDINGS: Cardiac shadow is stable. Postoperative changes are noted in the midthoracic spine. Right subclavian central line is noted with catheter tip in the superior right atrium. No pneumothorax is seen. Endotracheal tube is again noted in satisfactory position. Mild bibasilar atelectasis is seen. IMPRESSION: A pneumothorax following central line placement on the right. The remainder of the exam is stable. Electronically Signed: By: Inez Catalina M.D. On: 03/10/2017 09:13   Dg Chest Port 1 View  Result Date: 03/10/2017 CLINICAL DATA:  Acute respiratory failure. Encounter for intubation. EXAM: PORTABLE CHEST 1 VIEW COMPARISON:  Chest x-ray dated 02/23/2017. FINDINGS: Endotracheal tube appears well positioned with tip approximately 2.5 cm above the carina. Patchy bibasilar airspace opacities, atelectasis versus  pneumonia. Probable small left pleural effusion. No pneumothorax seen. Heart size and mediastinal contours appear stable. No acute or suspicious osseous finding. Stomach appears to be markedly distended with air, incompletely imaged. IMPRESSION: 1. Endotracheal tube appears well positioned with tip above the level of the carina. 2. Patchy bibasilar airspace opacities, atelectasis versus pneumonia. Probable small left pleural effusion. 3. Stomach appears to be markedly distended with air, incompletely imaged. Consider confirmation/further characterization with plain film of the abdomen. These results will be called to the ordering clinician or representative by the Radiologist Assistant, and communication documented in the PACS or zVision Dashboard. Electronically Signed   By: Franki Cabot M.D.   On: 03/10/2017 07:59   Dg Chest Port 1 View  Result Date: 02/21/2017 CLINICAL DATA:  Respiratory distress. EXAM: PORTABLE CHEST 1 VIEW COMPARISON:  Chest CT 10/20/2016 FINDINGS: Midthoracic fusion and corpectomy Low lung volumes. Lung metastasis not well identified. One nodule noted LEFT lung base. For low lung volumes. No focal infiltrate. Basilar atelectasis on LEFT. IMPRESSION: LEFT basilar atelectasis.  No evidence pneumonia.  Mild congestion. Pulmonary metastasis under appreciated on radiograph Electronically Signed   By: Suzy Bouchard M.D.   On: 02/17/2017 23:35   Dg Abd Portable 1v  Result Date: 03/10/2017 CLINICAL DATA:  Encounter for orogastric tube placement. EXAM: PORTABLE ABDOMEN - 1 VIEW COMPARISON:  None. FINDINGS: Orogastric tube is in the left upper abdomen and likely within the stomach body. The stomach is distended with gas. Surgical hardware in the thoracic spine. There is a central venous catheter with the tip near the right atrium. Left basilar chest densities. IMPRESSION: Orogastric tube is in  the stomach.  Gaseous distention of stomach. Central line tip is near the right atrium.  Electronically Signed   By: Markus Daft M.D.   On: 03/10/2017 11:49    Microbiology Recent Results (from the past 240 hour(s))  Blood Culture (routine x 2)     Status: Abnormal   Collection Time: 03/08/2017 10:10 PM  Result Value Ref Range Status   Specimen Description BLOOD RIGHT ANTECUBITAL  Final   Special Requests   Final    BOTTLES DRAWN AEROBIC AND ANAEROBIC Blood Culture adequate volume   Culture  Setup Time   Final    GRAM POSITIVE COCCI IN BOTH AEROBIC AND ANAEROBIC BOTTLES CRITICAL RESULT CALLED TO, READ BACK BY AND VERIFIED WITH: SCOTT CHRISTY ON 03/10/17 AT 1220 [facility]    Culture (A)  Final    STAPHYLOCOCCUS AUREUS SUSCEPTIBILITIES PERFORMED ON PREVIOUS CULTURE WITHIN THE LAST 5 DAYS. Performed at [facility], [place] [address] [place], [place] [address]    Report Status 03/12/2017 FINAL  Final  Blood Culture (routine x 2)     Status: Abnormal   Collection Time: 02/22/2017 10:30 PM  Result Value Ref Range Status   Specimen Description BLOOD BLOOD RIGHT ARM  Final   Special Requests   Final    BOTTLES DRAWN AEROBIC AND ANAEROBIC Blood Culture results may not be optimal due to an excessive volume of blood received in culture bottles   Culture  Setup Time   Final    IN BOTH AEROBIC AND ANAEROBIC BOTTLES GRAM POSITIVE COCCI CRITICAL RESULT CALLED TO, READ BACK BY AND VERIFIED WITH: SCOTT CHRISTY ON 03/10/17 AT 1220 [facility]    Culture STAPHYLOCOCCUS AUREUS (A)  Final   Report Status 03/12/2017 FINAL  Final   Organism ID, Bacteria STAPHYLOCOCCUS AUREUS  Final      Susceptibility   Staphylococcus aureus - MIC*    CIPROFLOXACIN <=0.5 SENSITIVE Sensitive     ERYTHROMYCIN >=8 RESISTANT Resistant     GENTAMICIN <=0.5 SENSITIVE Sensitive     OXACILLIN 0.5 SENSITIVE Sensitive     TETRACYCLINE <=1 SENSITIVE Sensitive     VANCOMYCIN <=0.5 SENSITIVE Sensitive     TRIMETH/SULFA <=10 SENSITIVE Sensitive     CLINDAMYCIN <=0.25 SENSITIVE Sensitive     RIFAMPIN <=0.5 SENSITIVE Sensitive      Inducible Clindamycin NEGATIVE Sensitive     * STAPHYLOCOCCUS AUREUS  Blood Culture ID Panel (Reflexed)     Status: Abnormal   Collection Time: 02/20/2017 10:30 PM  Result Value Ref Range Status   Enterococcus species NOT DETECTED NOT DETECTED Final   Listeria monocytogenes NOT DETECTED NOT DETECTED Final   Staphylococcus species DETECTED (A) NOT DETECTED Final    Comment: CRITICAL RESULT CALLED TO, READ BACK BY AND VERIFIED WITH: SCOTT CHRISTY ON 03/10/17 AT 1220 [place]    Staphylococcus aureus DETECTED (A) NOT DETECTED Final    Comment: Methicillin (oxacillin) susceptible Staphylococcus aureus (MSSA). Preferred therapy is anti staphylococcal beta lactam antibiotic (Cefazolin or Nafcillin), unless clinically contraindicated. CRITICAL RESULT CALLED TO, READ BACK BY AND VERIFIED WITH: SCOTT CHRISTY ON 03/10/17 AT 1220 [place]    Methicillin resistance NOT DETECTED NOT DETECTED Final   Streptococcus species NOT DETECTED NOT DETECTED Final   Streptococcus agalactiae NOT DETECTED NOT DETECTED Final   Streptococcus pneumoniae NOT DETECTED NOT DETECTED Final   Streptococcus pyogenes NOT DETECTED NOT DETECTED Final   Acinetobacter baumannii NOT DETECTED NOT DETECTED Final   Enterobacteriaceae species NOT DETECTED NOT DETECTED Final   Enterobacter cloacae complex  NOT DETECTED NOT DETECTED Final   Escherichia coli NOT DETECTED NOT DETECTED Final   Klebsiella oxytoca NOT DETECTED NOT DETECTED Final   Klebsiella pneumoniae NOT DETECTED NOT DETECTED Final   Proteus species NOT DETECTED NOT DETECTED Final   Serratia marcescens NOT DETECTED NOT DETECTED Final   Haemophilus influenzae NOT DETECTED NOT DETECTED Final   Neisseria meningitidis NOT DETECTED NOT DETECTED Final   Pseudomonas aeruginosa NOT DETECTED NOT DETECTED Final   Candida albicans NOT DETECTED NOT DETECTED Final   Candida glabrata NOT DETECTED NOT DETECTED Final   Candida krusei NOT DETECTED NOT DETECTED Final   Candida parapsilosis NOT  DETECTED NOT DETECTED Final   Candida tropicalis NOT DETECTED NOT DETECTED Final  Urine culture     Status: Abnormal   Collection Time: 03/10/17 12:30 AM  Result Value Ref Range Status   Specimen Description URINE, CATHETERIZED  Final   Special Requests NONE  Final   Culture >=100,000 COLONIES/mL STAPHYLOCOCCUS AUREUS (A)  Final   Report Status 03/12/2017 FINAL  Final   Organism ID, Bacteria STAPHYLOCOCCUS AUREUS (A)  Final      Susceptibility   Staphylococcus aureus - MIC*    CIPROFLOXACIN <=0.5 SENSITIVE Sensitive     GENTAMICIN <=0.5 SENSITIVE Sensitive     NITROFURANTOIN <=16 SENSITIVE Sensitive     OXACILLIN 0.5 SENSITIVE Sensitive     TETRACYCLINE <=1 SENSITIVE Sensitive     VANCOMYCIN <=0.5 SENSITIVE Sensitive     TRIMETH/SULFA <=10 SENSITIVE Sensitive     CLINDAMYCIN <=0.25 SENSITIVE Sensitive     RIFAMPIN <=0.5 SENSITIVE Sensitive     Inducible Clindamycin NEGATIVE Sensitive     * >=100,000 COLONIES/mL STAPHYLOCOCCUS AUREUS  MRSA PCR Screening     Status: None   Collection Time: 03/10/17  2:22 AM  Result Value Ref Range Status   MRSA by PCR NEGATIVE NEGATIVE Final    Comment:        The GeneXpert MRSA Assay (FDA approved for NASAL specimens only), is one component of a comprehensive MRSA colonization surveillance program. It is not intended to diagnose MRSA infection nor to guide or monitor treatment for MRSA infections.   Culture, respiratory (NON-Expectorated)     Status: None   Collection Time: 03/10/17  9:00 AM  Result Value Ref Range Status   Specimen Description TRACHEAL ASPIRATE  Final   Special Requests NONE  Final   Gram Stain   Final    FEW WBC PRESENT, PREDOMINANTLY MONONUCLEAR RARE SQUAMOUS EPITHELIAL CELLS PRESENT RARE GRAM POSITIVE COCCI IN PAIRS IN SINGLES Performed at [facility], [place] [address] [place], [place] [address]    Culture RARE STAPHYLOCOCCUS AUREUS  Final   Report Status 03/13/2017 FINAL  Final   Organism ID, Bacteria  STAPHYLOCOCCUS AUREUS  Final      Susceptibility   Staphylococcus aureus - MIC*    CIPROFLOXACIN <=0.5 SENSITIVE Sensitive     ERYTHROMYCIN >=8 RESISTANT Resistant     GENTAMICIN <=0.5 SENSITIVE Sensitive     OXACILLIN 0.5 SENSITIVE Sensitive     TETRACYCLINE <=1 SENSITIVE Sensitive     VANCOMYCIN 1 SENSITIVE Sensitive     TRIMETH/SULFA <=10 SENSITIVE Sensitive     CLINDAMYCIN <=0.25 SENSITIVE Sensitive     RIFAMPIN <=0.5 SENSITIVE Sensitive     Inducible Clindamycin NEGATIVE Sensitive     * RARE STAPHYLOCOCCUS AUREUS    Lab Basic Metabolic Panel:  Recent Labs Lab 03/10/17 0244  03/11/17 0515 03/12/17 0457 03/14/17 0900 03/14/17 0943 03/14/17 1820 03/15/17  0500 March 31, 2017 0349  NA 139  < > 143 149* 160*  --  160* 160* 157*  K 4.1  < > 3.7 3.3* 2.7*  --  3.4* 3.5 3.4*  CL 104  < > 113* 119* 128*  --   --  128* 126*  CO2 23  < > 21* 24 26  --   --  26 25  GLUCOSE 153*  < > 134* 129* 141*  --   --  148* 156*  BUN 90*  < > 84* 68* 50*  --   --  49* 40*  CREATININE 3.02*  < > 1.84* 1.53* 1.13  --   --  1.04 1.12  CALCIUM 9.0  < > 8.0* 8.3* 8.4*  --   --  8.3* 7.8*  MG 2.0  --   --  2.2  --  2.4  --   --   --   PHOS 3.6  --   --   --   --   --   --   --   --   < > = values in this interval not displayed. Liver Function Tests:  Recent Labs Lab 02/18/2017 2220 03/10/17 1537 03/11/17 0515 03/15/17 0500  AST 39 56* 45* 21  ALT 19 23 21  12*  ALKPHOS 94 76 68 72  BILITOT 0.7 0.8 1.8* 1.7*  PROT 6.3* 5.2* 5.1* 5.8*  ALBUMIN 2.4* 1.8* 1.8* 1.8*    Recent Labs Lab 02/28/2017 2220  LIPASE 11   No results for input(s): AMMONIA in the last 168 hours. CBC:  Recent Labs Lab 03/08/2017 2220 03/10/17 0244 03/12/17 0457 03/14/17 0900 03/15/17 0500 03/31/17 0349  WBC 7.6 5.1 6.8 8.9 12.6* 12.2*  NEUTROABS 6.5  --   --   --  11.6*  --   HGB 11.8* 11.1* 8.4* 8.4* 8.9* 8.0*  HCT 35.5* 33.3* 25.1* 25.7* 27.0* 24.4*  MCV 90.2 88.7 86.2 87.4 89.0 88.2  PLT 116* 116* 83* 87*  106* 106*   Cardiac Enzymes:  Recent Labs Lab 03/06/2017 2220 03/10/17 0245 03/10/17 1121 03/10/17 1537  TROPONINI 0.24* 0.29* 0.11* 0.09*   Sepsis Labs:  Recent Labs Lab 02/23/2017 2220 03/10/17 0244 03/10/17 0646 03/10/17 1121 03/11/17 0515 03/12/17 0457 03/14/17 0900 03/15/17 0500 03-31-17 0349  PROCALCITON 21.78  --   --  19.73 15.64 7.84  --   --   --   WBC 7.6 5.1  --   --   --  6.8 8.9 12.6* 12.2*  LATICACIDVEN 5.2* 5.1* 3.6* 5.2*  --   --   --   --   --     Procedures/Operations  Intubation CENTRAL VENOUS CATHETER fOLEY  Magdalene S. [facility] ANP-BC Pulmonary and [place] Pager [phone] or [phone] 03-31-17, 9:56 PM   Merton Border, MD PCCM service Mobile [phone] Pager [phone] 03/18/2017 12:08 PM

## 2017-03-19 NOTE — Progress Notes (Signed)
PT PROFILE: 61 year old male admitted 03/10/2017 with widely metastatic renal cell carcinoma presenting with severe sepsis, MSSA bacteremia, septic shock, acute respiratory failure and acute renal failure  MAJOR EVENTS/TEST RESULTS: 09/22 PCCM consultation 09/22 nephrology consultation 09/22 infectious disease consultation 09/25 oncology consultation 09/26 palliative care service consultation 09/22 renal ultrasound:  INDWELLING DEVICES:: 09/22 ETT 09/22 R [place] CVL  MICRO DATA: MRSA PCR 09/22 >> NEG Urine 09/22 >> MSSA Resp 09/22 >> MSSA Blood 09/21 >> MSSA  ANTIMICROBIALS:  Anti-infectives    Start     Dose/Rate Route Frequency Ordered Stop   03/13/17 0915  nafcillin 2 g in dextrose 5 % 100 mL IVPB  Status:  Discontinued     2 g 200 mL/hr over 30 Minutes Intravenous Every 4 hours 03/13/17 0910 2017/04/07 1226   03/10/17 2000  nafcillin injection 2 g  Status:  Discontinued     2 g Intravenous Every 4 hours 03/10/17 1343 03/10/17 1439   03/10/17 2000  nafcillin 2 g in dextrose 5 % 100 mL IVPB  Status:  Discontinued     2 g 200 mL/hr over 30 Minutes Intravenous Every 4 hours 03/10/17 1439 03/12/17 1119   03/10/17 0600  vancomycin (VANCOCIN) 1,500 mg in sodium chloride 0.9 % 500 mL IVPB  Status:  Discontinued     1,500 mg 250 mL/hr over 120 Minutes Intravenous Every 36 hours 03/10/17 0034 03/10/17 1343   03/10/17 0600  piperacillin-tazobactam (ZOSYN) IVPB 3.375 g  Status:  Discontinued     3.375 g 12.5 mL/hr over 240 Minutes Intravenous Every 8 hours 03/10/17 0034 03/10/17 1343   02/20/2017 2330  piperacillin-tazobactam (ZOSYN) IVPB 3.375 g     3.375 g 100 mL/hr over 30 Minutes Intravenous  Once 03/14/2017 2318 03/10/17 0002   03/11/2017 2330  vancomycin (VANCOCIN) IVPB 1000 mg/200 mL premix     1,000 mg 200 mL/hr over 60 Minutes Intravenous  Once 02/23/2017 2318 03/10/17 0052      SUBJ: Sedated, intubated, not following commands  OBJ: Vitals:   Apr 07, 2017 0900 2017-04-07 1000 Apr 07, 2017  1100 04-07-17 1200  BP: 100/66 117/80 120/79 135/90  Pulse:    (!) 104  Resp: 18 18 18 20   Temp: (!) 100.9 F (38.3 C) (!) 101.1 F (38.4 C) (!) 101.5 F (38.6 C) (!) 101.3 F (38.5 C)  TempSrc:    Core (Comment)  SpO2:    92%  Weight:      Height:       Chronically ill-appearing NCAT, sclerae white JVP cannot be visualized Chest clear anteriorly Regular, no M noted Abdomen soft, + BS Extremities cool, no edema Moves all 4 extremities, CNs intact  BMP Latest Ref Rng & Units April 07, 2017 03/15/2017 03/14/2017  Glucose 65 - 99 mg/dL 156(H) 148(H) -  BUN 6 - 20 mg/dL 40(H) 49(H) -  Creatinine 0.61 - 1.24 mg/dL 1.12 1.04 -  Sodium 135 - 145 mmol/L 157(H) 160(H) 160(H)  Potassium 3.5 - 5.1 mmol/L 3.4(L) 3.5 3.4(L)  Chloride 101 - 111 mmol/L 126(H) 128(H) -  CO2 22 - 32 mmol/L 25 26 -  Calcium 8.9 - 10.3 mg/dL 7.8(L) 8.3(L) -    CBC Latest Ref Rng & Units 04/07/17 03/15/2017 03/14/2017  WBC 3.8 - 10.6 K/uL 12.2(H) 12.6(H) 8.9  Hemoglobin 13.0 - 18.0 g/dL 8.0(L) 8.9(L) 8.4(L)  Hematocrit 40.0 - 52.0 % 24.4(L) 27.0(L) 25.7(L)  Platelets 150 - 440 K/uL 106(L) 106(L) 87(L)    CXR: No new film  IMPRESSION: Widely metastatic renal cell carcinoma MSSA  bacteremia with pneumonia and UTI Ventilator dependent acute respiratory failure Severe sepsis/Septic shock Minimally elevated troponin I AKI Hypokalemia Hypernatremia Severe chronic pain Toxic-metabolic encephalopathy  PLAN/REC: I spoke in detail with the patient's wife, daughter and son (who has just arrived from [place] where he was deployed with the [employer]). Mr. Lampkins existence over the past few weeks and months has been miserable with severe pain and progressive encephalopathy/delirium. I explained to them that his quality of life will not improve as a result of our efforts. In fact, the best possible survival would be with an even more impaired QOL. They do not wish to see him suffer any further and wish to proceed with  withdrawal of vent support, comfort measures only. Protocol has been ordered.  CCM time: 35 mins  The above time includes time spent in consultation with patient and/or family members and reviewing care plan on multidisciplinary rounds  Merton Border, MD PCCM service Mobile [phone] Pager [phone] 03-28-17 1:40 PM

## 2017-03-19 NOTE — Progress Notes (Signed)
NP Arvil Chaco notified.

## 2017-03-19 NOTE — Progress Notes (Signed)
Pt extubated

## 2017-03-19 NOTE — Progress Notes (Signed)
Patient aystole.  Wife at bedside.

## 2017-03-19 NOTE — Progress Notes (Signed)
Patient with profound bradycardia.

## 2017-03-19 NOTE — Progress Notes (Signed)
[facility] [place] NAME: Joaquin Knebel    MR#:  [id number]  DATE OF BIRTH:  25-Feb-1956  SUBJECTIVE:  CHIEF COMPLAINT:   Chief Complaint  Patient presents with  . Code Sepsis   -Patient with left renal cell carcinoma with metastatic disease, admitted with altered mental status and respiratory failure and septic shock - currently remains on vent- febrile today but family decided for compassionate extubation and comfort care measures today  son arrived from [place], No family members  at bedside today during my visit   REVIEW OF SYSTEMS:  Review of Systems  Unable to perform ROS: Critical illness    DRUG ALLERGIES:  No Known Allergies  VITALS:  Blood pressure 117/80, pulse (!) 104, temperature (!) 101.1 F (38.4 C), resp. rate 18, height 6\' 3"  (1.905 m), weight 98.8 kg (217 lb 13 oz), SpO2 94 %.  PHYSICAL EXAMINATION:  Physical Exam  GENERAL:  61 y.o.-year-old patient lying in the bed, Sedated, critically ill appearing.  EYES: Pupils equal, round, sluggish reaction to light. No scleral icterus. Extraocular muscles intact.  HEENT: Head atraumatic, normocephalic. Oropharynx and nasopharynx clear.  NECK:  Supple, no jugular venous distention. No thyroid enlargement, no tenderness.  LUNGS: diminished breath sounds bilaterally, no wheezing, rales,rhonchi or crepitation. No use of accessory muscles of respiration. ,on vent CARDIOVASCULAR: S1, S2 normal. No murmurs, rubs, or gallops.  ABDOMEN: Soft, nontender, nondistended. Bowel sounds present. No organomegaly or mass.  EXTREMITIES: No pedal edema, cyanosis, or clubbing.  NEUROLOGIC: Sedated and intubated PSYCHIATRIC: The patient is sedated.  SKIN: No obvious rash, lesion, or ulcer.    LABORATORY PANEL:   CBC  Recent Labs Lab 2017-03-18 0349  WBC 12.2*  HGB 8.0*  HCT 24.4*  PLT 106*    ------------------------------------------------------------------------------------------------------------------  Chemistries   Recent Labs Lab 03/14/17 0943  03/15/17 0500 18-Mar-2017 0349  NA  --   < > 160* 157*  K  --   < > 3.5 3.4*  CL  --   --  128* 126*  CO2  --   --  26 25  GLUCOSE  --   --  148* 156*  BUN  --   --  49* 40*  CREATININE  --   --  1.04 1.12  CALCIUM  --   --  8.3* 7.8*  MG 2.4  --   --   --   AST  --   --  21  --   ALT  --   --  12*  --   ALKPHOS  --   --  72  --   BILITOT  --   --  1.7*  --   < > = values in this interval not displayed. ------------------------------------------------------------------------------------------------------------------  Cardiac Enzymes  Recent Labs Lab 03/10/17 1537  TROPONINI 0.09*   ------------------------------------------------------------------------------------------------------------------  RADIOLOGY:  Dg Chest Port 1 View  Result Date: 03/15/2017 CLINICAL DATA:  Respiratory failure. EXAM: PORTABLE CHEST 1 VIEW COMPARISON:  03/12/2017 . FINDINGS: Endotracheal tube, NG tube, right subclavian line in stable position. Cardiomegaly with normal pulmonary vascularity. Progression of bibasilar atelectasis/infiltrates noted. Small left pleural effusion. No pneumothorax . Prior thoracic spine fusion. Thoracic spine scoliosis. IMPRESSION: 1. Lines and tubes in stable position. 2. Progressive bibasilar atelectasis/infiltrates . Small left pleural effusion . Electronically Signed   By: Marcello Moores  Register   On: 03/15/2017 06:38    EKG:   Orders placed or performed during the hospital encounter of 02/23/2017  .  EKG 12-Lead  . EKG 12-Lead  . EKG 12-Lead  . EKG 12-Lead    ASSESSMENT AND PLAN:   61 year old male with past medical history significant for metastatic left renal cell carcinoma, hypertension, anxiety, congestive heart failure presents to hospital secondary to altered mental status.  # acute respiratory  failure- failed BiPAP. Secondary to sepsis -Currently intubated and on ventilator. -Failed spontaneous breathing trials. -fentanyl gtt   # acute renal failure-renal function was normal last week, renal failure is acute with creatinine of 3.4 on adm. -Renal ultrasound with no obstruction. Carcinomatous left kidney noted. Medico-renal disease in right -Renal function improved with fluids and treatment of underlying sepsis. -Nephrology is following  # sepsis-secondary to urinary tract infection -febrile today - Blood and urine cultures with staph aureus. Patient was on nafcillin. -Also was on Levophed for his septic shock.  # metastatic renal cell carcinoma-following with oncology. Diagnosed in 2015 left renal cell carcinoma -Has lung metastatic disease. 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 -Was on chemotherapy with cabozantinib-which has been held for the last 2 weeks.  Very poor prognosis   Remains intubated while family awaiting for son to arrive from [place]   family decided for compassionate extubation and comfort care measures possibly today as son arrived from [place]    All the records are reviewed and case discussed with Care Management/Social Workerr. Management plans discussed with the patient, family by intensivist and they are in agreement.  CODE STATUS: DNR   TOTAL TIME TAKING CARE OF THIS PATIENT: 22 minutes.   POSSIBLE D/C IN ? DAYS, DEPENDING ON CLINICAL CONDITION.   Nicholes Mango M.D on 03/27/2017 at 10:30 AM  Between 7am to 6pm - Pager - [phone]  After 6pm go to [email or web address] - password [email or web address] [facility]  Office  [phone]  CC: Primary care physician; [name], MD

## 2017-03-19 NOTE — Progress Notes (Signed)
E-link notified that patient has been made comfort care.

## 2017-03-19 DEATH — deceased

## 2017-03-21 ENCOUNTER — Ambulatory Visit: Payer: Medicaid Other

## 2017-03-22 ENCOUNTER — Telehealth: Payer: Self-pay | Admitting: Pulmonary Disease

## 2017-03-22 NOTE — Telephone Encounter (Signed)
Carlton dropped off Death Certificate to be signed Placed in Advertising copywriter

## 2017-03-22 NOTE — Telephone Encounter (Signed)
Death cert placed in DS' folder to be signed.

## 2017-03-23 NOTE — Telephone Encounter (Signed)
Called and left message for Mr. Marc Blake death cert ready for pick-up.

## 2017-03-26 ENCOUNTER — Ambulatory Visit: Payer: Medicaid Other

## 2017-03-28 ENCOUNTER — Ambulatory Visit: Payer: Medicaid Other

## 2017-03-30 ENCOUNTER — Other Ambulatory Visit: Payer: Self-pay | Admitting: Nurse Practitioner

## 2017-03-30 ENCOUNTER — Ambulatory Visit: Payer: Medicaid Other | Admitting: Hematology and Oncology

## 2017-03-30 ENCOUNTER — Other Ambulatory Visit: Payer: Medicaid Other

## 2018-06-01 IMAGING — NM NM BONE WHOLE BODY
2 series · 6 of 6 positions shown · non-contrast
Comparison: CT chest, abdomen, and pelvis December 14, 2015

CLINICAL DATA: Metastatic renal cell carcinoma

EXAM:
NUCLEAR MEDICINE WHOLE BODY BONE SCAN
TECHNIQUE: Whole body anterior and posterior images were obtained approximately
3 hours after intravenous injection of radiopharmaceutical.
RADIOPHARMACEUTICALS:  24.125 mCi Lechnetium-YYm MDP IV

[Series 1000: 3 hr wholebody · 2.40mm/px · 2 of 2 frames shown]
[frame 1/2]
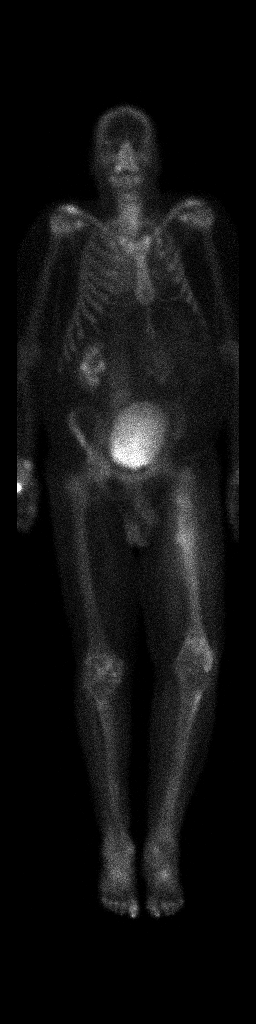
[frame 2/2]
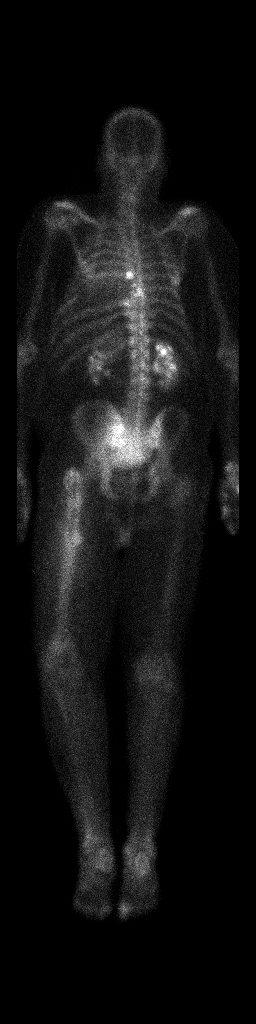

[Series 1000: statics · 2.40mm/px · 2 acquisitions, 4 frames shown]
[im 1/2]
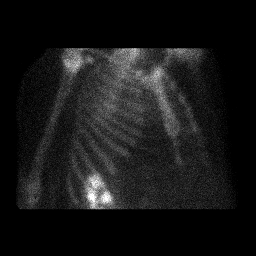
[im 1/2]
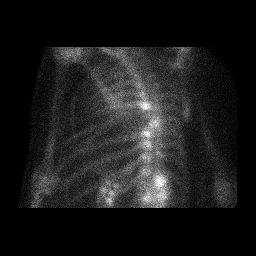
[im 2/2]
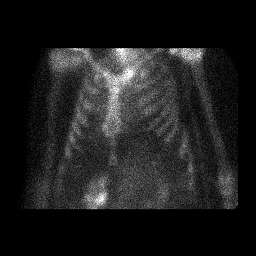
[im 2/2]
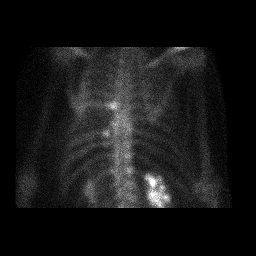

[6 of 6 positions shown; findings below may reference images not displayed]

FINDINGS: The patient has had prior removal of the left eighth rib with
photopenia in this area. There is increased uptake in the posterior
element region of T7 as well as in the region of the T8 vertebral
body. There is evidence of postoperative change in this region, and
the abnormal uptake on the bone scan may be secondary to previous
surgery in this area.

There is abnormal radiotracer uptake throughout most of the left
femur. By report, the patient has had previous surgery and radiation
therapy in this area due to prior neoplasia detected in this bone.
There is increased uptake in the soft tissues of the proximal to mid
femur, likely secondary to previous radiation therapy and associated
inflammation.

There is increased uptake in the shoulders, wrists, and ankle
regions, likely of arthropathic etiology.

Radiotracer uptake in the other bony structures are felt to be
unremarkable.

Urinary bladder is distended, raising concern for a degree of
bladder outlet obstruction. Renal uptake is noted bilaterally.
Photopenia in the upper pole the left kidney is consistent with the
known mass in this region.
IMPRESSION: Photopenia from prior removal of the left eighth rib. Increased
uptake in the medial thoracic spine from T7 and T9 is felt to be due
to postoperative fusion common noted on CT. While underlying
neoplasm based solely on the bone scan appearance cannot be
excluded, CT does not show evidence of neoplasm in this area, in the
increased uptake may be explained based on previous surgery and
inflammation.

There is increased uptake throughout much of the left femur as well
as increased uptake in the soft tissues of much of the left femur.
The current abnormal radiotracer uptake may well be due to previous
surgery and radiation therapy change. An area of relative decreased
attenuation in the proximal left femur is likely of postoperative
etiology. Recent CT indeed shows metallic fixation in a portion of
the proximal left femur. Given the abnormal uptake in the left
femur, radiographic evaluation of the left femur is advised to
further evaluate. Soft tissue uptake in this area most likely is of
inflammatory etiology from previous radiation therapy in this area.

Radiotracer uptake in other bony structures appear unremarkable. No
abnormal uptake is noted in the bony structures which is felt to be
likely for bony metastatic disease beyond areas of abnormal uptake
in the left femur which warrant radiographic correlation for further
evaluation.

Question urinary bladder outlet obstruction.

Areas of arthropathy in shoulders, wrists, and ankles.

Evidence of mass arising from upper pole left kidney.

## 2018-06-03 IMAGING — US US EXTREM LOW VENOUS*L*
1 series · 13 of 24 positions shown · non-contrast
Comparison: None.

CLINICAL DATA: Personal history of DVT involving the left lower
extremity with persistent left lower extremity pain and edema.
History of malignancy. Evaluate for acute or chronic DVT.



[Series 1: us extrem low venous*left* · 0.10mm/px · 13 of 33 slices shown]
[im 1/33]
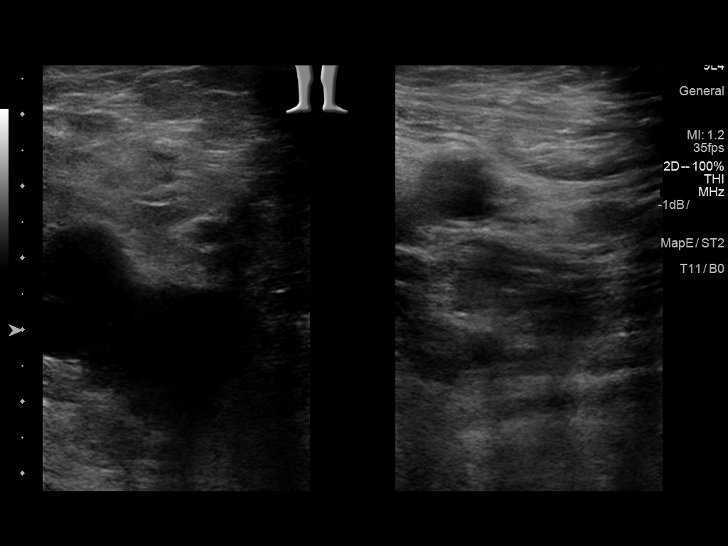
[im 3/33]
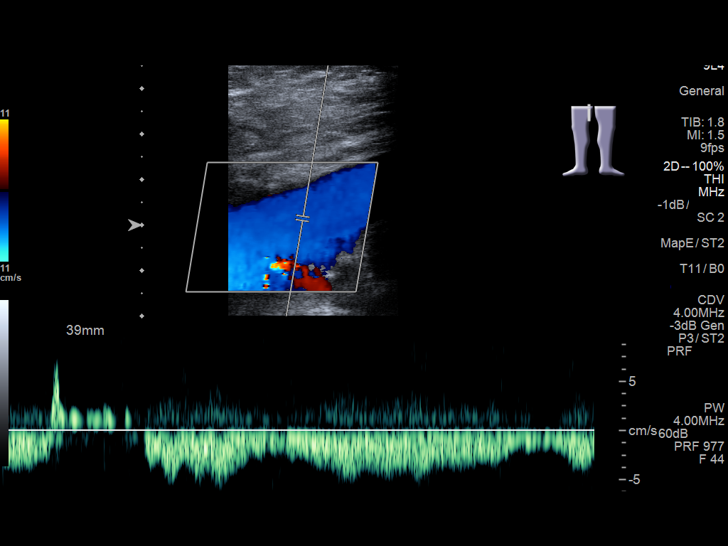
[im 6/33]
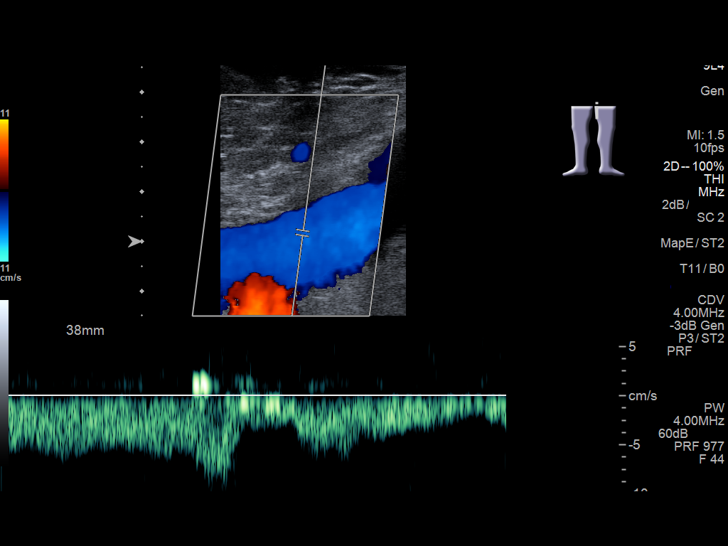
[im 9/33]
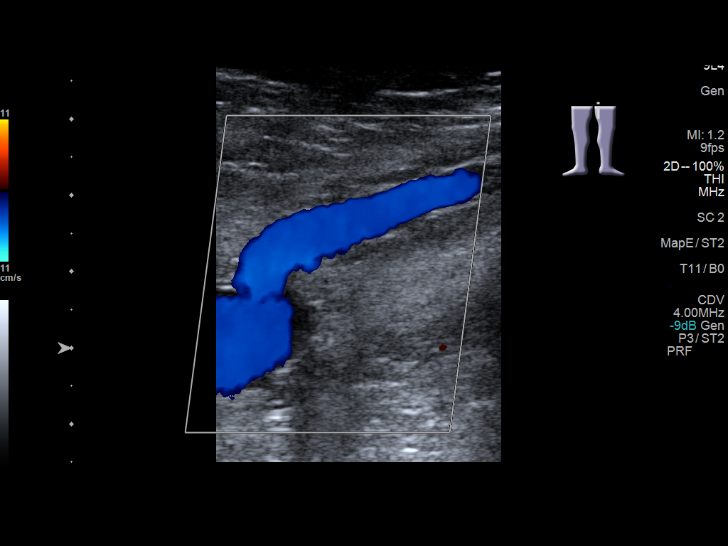
[im 12/33]
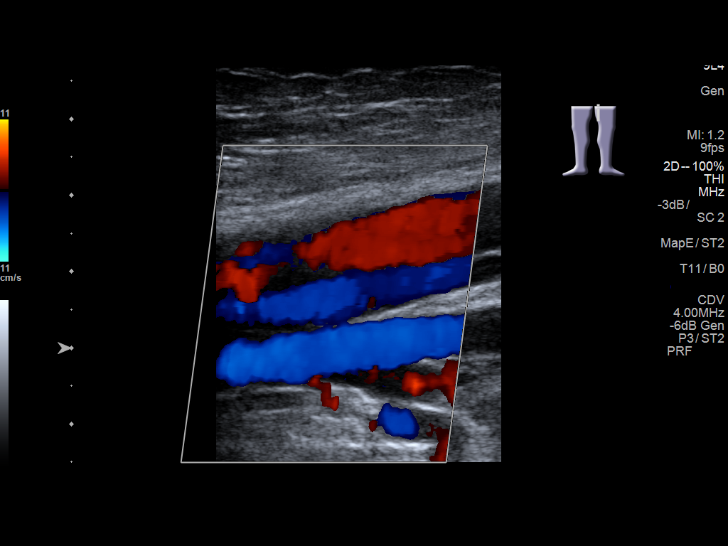
[im 14/33]
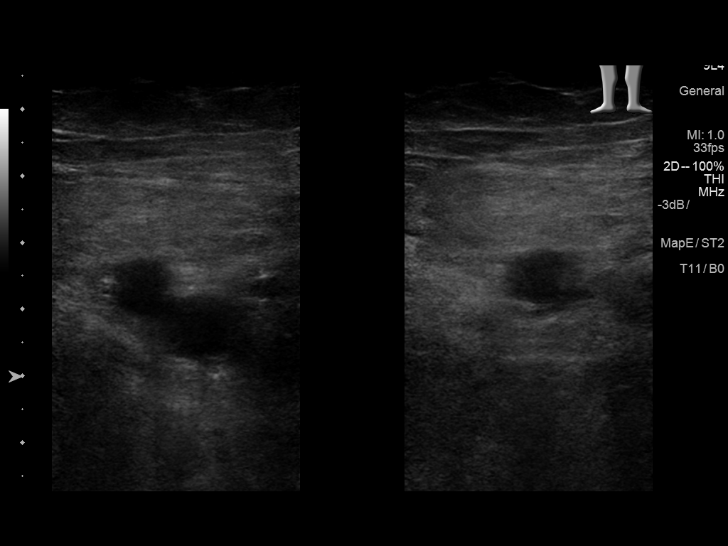
[im 17/33]
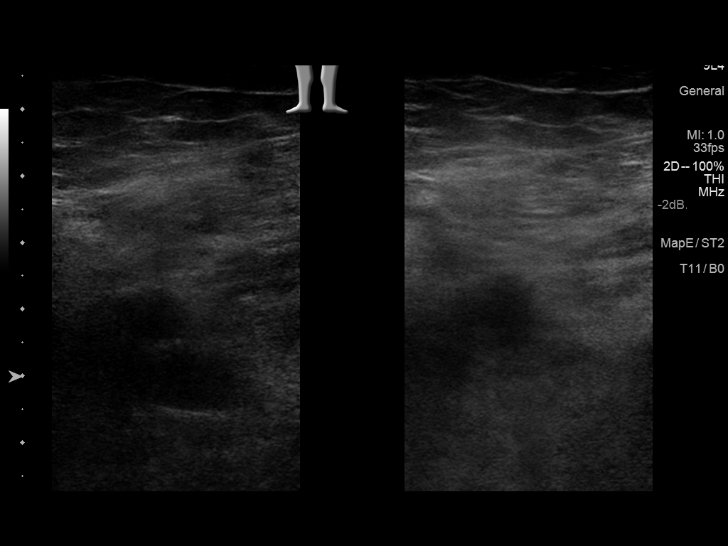
[im 19/33]
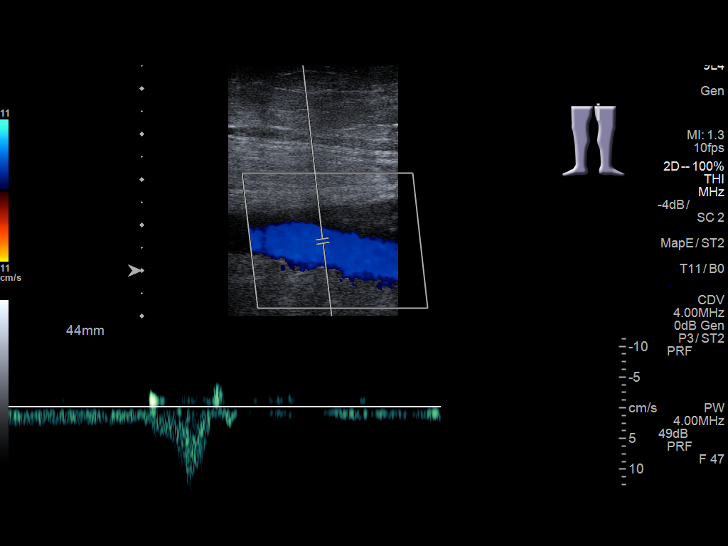
[im 21/33]
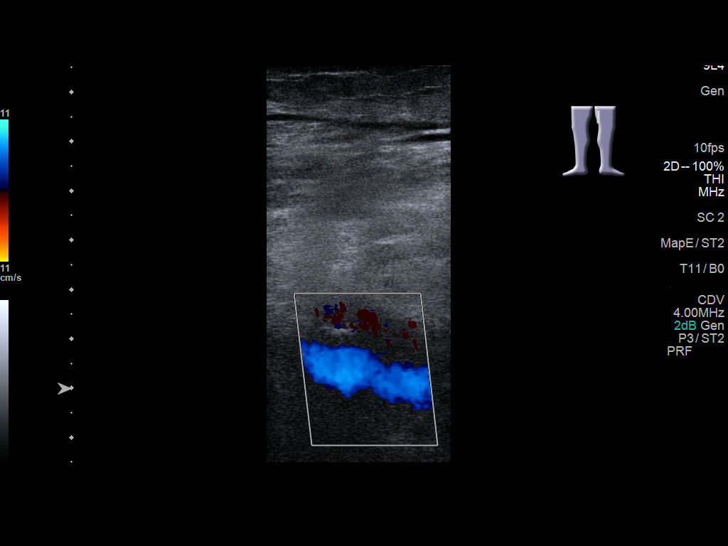
[im 24/33]
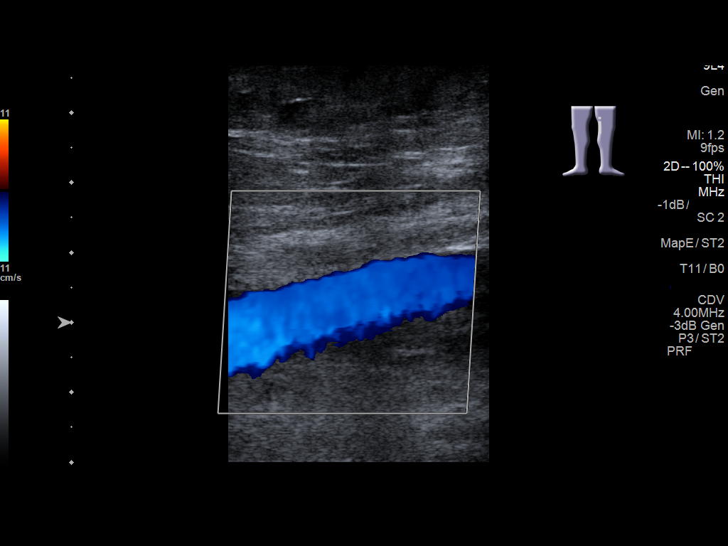
[im 27/33]
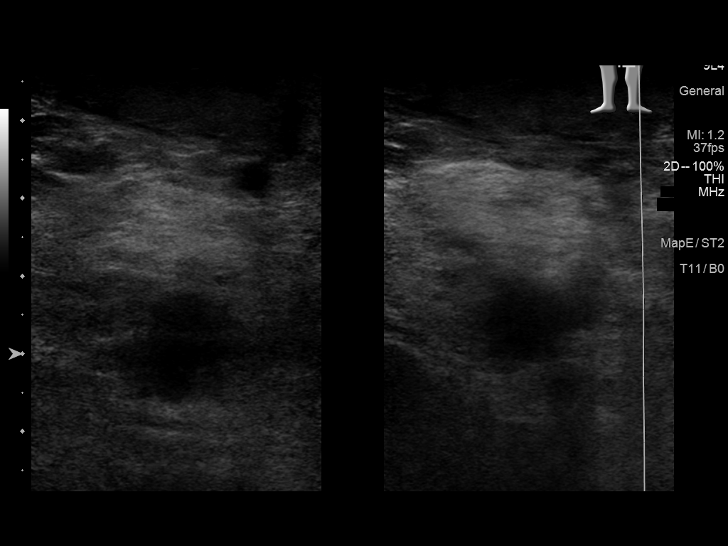
[im 30/33]
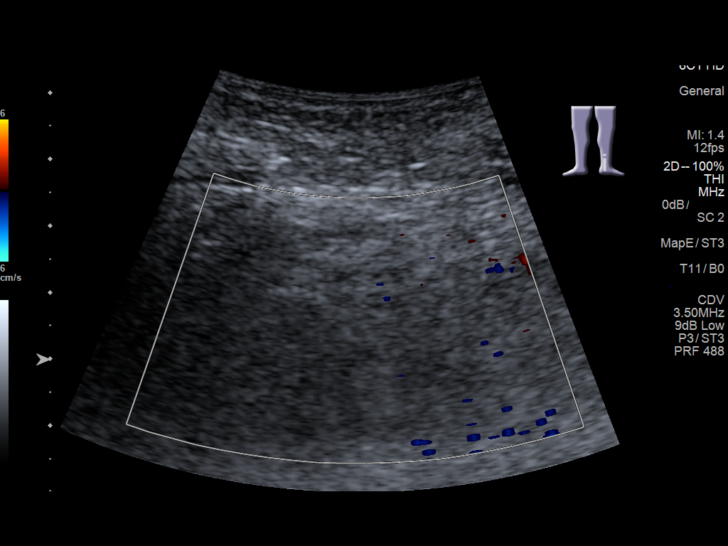
[im 33/33]
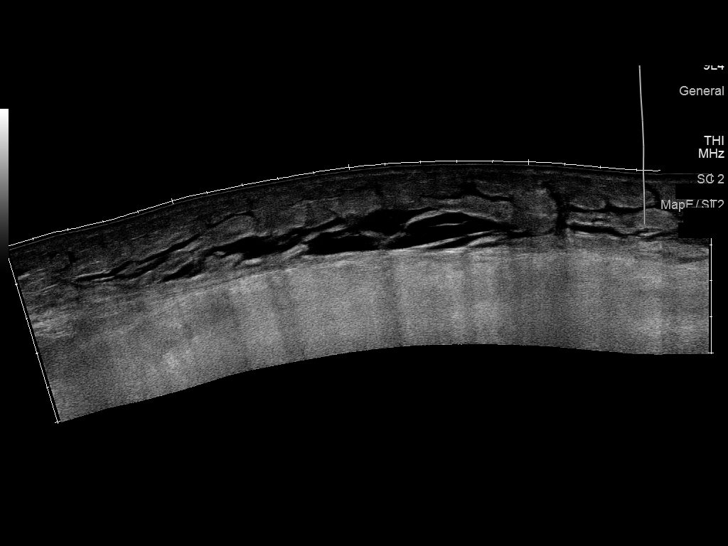

[13 of 24 positions shown; findings below may reference images not displayed]

FINDINGS: Contralateral Common Femoral Vein: Respiratory phasicity is normal
and symmetric with the symptomatic side. No evidence of thrombus.
Normal compressibility.

Common Femoral Vein: No evidence of thrombus. Normal
compressibility, respiratory phasicity and response to augmentation.

Saphenofemoral Junction: No evidence of thrombus. Normal
compressibility and flow on color Doppler imaging.

Profunda Femoral Vein: No evidence of thrombus. Normal
compressibility and flow on color Doppler imaging.

Femoral Vein: No evidence of thrombus. Normal compressibility,
respiratory phasicity and response to augmentation.

Popliteal Vein: No evidence of thrombus. Normal compressibility,
respiratory phasicity and response to augmentation.

Calf Veins: No evidence of thrombus. Normal compressibility and flow
on color Doppler imaging.

Superficial Great Saphenous Vein: No evidence of thrombus. Normal
compressibility and flow on color Doppler imaging.

Venous Reflux:  None.

Other Findings: There is a minimal amount of subcutaneous edema
noted at the level of the popliteal fossa and calf (representative
images 33 and 34).
IMPRESSION: No evidence of acute or chronic DVT within the left lower extremity.
